# Patient Record
Sex: Male | Born: 1935 | Race: White | Hispanic: No | Smoking: Former smoker
Health system: Southern US, Community
[De-identification: ages and names within clinical notes are randomized; demographics above are authoritative.]

## PROBLEM LIST (undated history)

## (undated) DIAGNOSIS — E7800 Pure hypercholesterolemia, unspecified: Secondary | ICD-10-CM

## (undated) DIAGNOSIS — F0390 Unspecified dementia without behavioral disturbance: Secondary | ICD-10-CM

## (undated) DIAGNOSIS — I219 Acute myocardial infarction, unspecified: Secondary | ICD-10-CM

## (undated) DIAGNOSIS — K219 Gastro-esophageal reflux disease without esophagitis: Secondary | ICD-10-CM

## (undated) DIAGNOSIS — I2510 Atherosclerotic heart disease of native coronary artery without angina pectoris: Secondary | ICD-10-CM

## (undated) DIAGNOSIS — C801 Malignant (primary) neoplasm, unspecified: Secondary | ICD-10-CM

## (undated) DIAGNOSIS — I509 Heart failure, unspecified: Secondary | ICD-10-CM

## (undated) DIAGNOSIS — I10 Essential (primary) hypertension: Secondary | ICD-10-CM

## (undated) DIAGNOSIS — IMO0001 Reserved for inherently not codable concepts without codable children: Secondary | ICD-10-CM

## (undated) DIAGNOSIS — I4891 Unspecified atrial fibrillation: Secondary | ICD-10-CM

## (undated) DIAGNOSIS — G459 Transient cerebral ischemic attack, unspecified: Secondary | ICD-10-CM

## (undated) DIAGNOSIS — J449 Chronic obstructive pulmonary disease, unspecified: Secondary | ICD-10-CM

## (undated) DIAGNOSIS — C61 Malignant neoplasm of prostate: Secondary | ICD-10-CM

## (undated) DIAGNOSIS — K922 Gastrointestinal hemorrhage, unspecified: Secondary | ICD-10-CM

## (undated) DIAGNOSIS — I209 Angina pectoris, unspecified: Secondary | ICD-10-CM

## (undated) DIAGNOSIS — E785 Hyperlipidemia, unspecified: Secondary | ICD-10-CM

## (undated) HISTORY — PX: FRACTURE SURGERY: SHX138

## (undated) HISTORY — PX: OTHER SURGICAL HISTORY: SHX169

---

## 1998-01-07 ENCOUNTER — Other Ambulatory Visit: Admission: RE | Admit: 1998-01-07 | Discharge: 1998-01-07 | Payer: Self-pay | Admitting: Urology

## 1998-01-09 ENCOUNTER — Inpatient Hospital Stay (HOSPITAL_COMMUNITY): Admission: EM | Admit: 1998-01-09 | Discharge: 1998-01-11 | Payer: Self-pay | Admitting: Emergency Medicine

## 1998-02-17 ENCOUNTER — Inpatient Hospital Stay (HOSPITAL_COMMUNITY): Admission: RE | Admit: 1998-02-17 | Discharge: 1998-02-20 | Payer: Self-pay | Admitting: Urology

## 1999-03-17 ENCOUNTER — Encounter (HOSPITAL_COMMUNITY): Admission: RE | Admit: 1999-03-17 | Discharge: 1999-06-15 | Payer: Self-pay | Admitting: Interventional Cardiology

## 1999-07-06 ENCOUNTER — Encounter: Admission: RE | Admit: 1999-07-06 | Discharge: 1999-10-04 | Payer: Self-pay | Admitting: Radiation Oncology

## 1999-08-19 ENCOUNTER — Encounter: Payer: Self-pay | Admitting: Urology

## 1999-08-21 ENCOUNTER — Ambulatory Visit (HOSPITAL_COMMUNITY): Admission: RE | Admit: 1999-08-21 | Discharge: 1999-08-21 | Payer: Self-pay | Admitting: Urology

## 2000-10-20 ENCOUNTER — Ambulatory Visit (HOSPITAL_COMMUNITY): Admission: RE | Admit: 2000-10-20 | Discharge: 2000-10-21 | Payer: Self-pay | Admitting: Interventional Cardiology

## 2001-05-31 ENCOUNTER — Encounter (INDEPENDENT_AMBULATORY_CARE_PROVIDER_SITE_OTHER): Payer: Self-pay

## 2001-05-31 ENCOUNTER — Ambulatory Visit (HOSPITAL_COMMUNITY): Admission: RE | Admit: 2001-05-31 | Discharge: 2001-05-31 | Payer: Self-pay | Admitting: Gastroenterology

## 2008-04-02 ENCOUNTER — Ambulatory Visit (HOSPITAL_COMMUNITY): Admission: RE | Admit: 2008-04-02 | Discharge: 2008-04-02 | Payer: Self-pay | Admitting: Interventional Cardiology

## 2010-06-03 ENCOUNTER — Ambulatory Visit: Payer: Self-pay | Admitting: Family Medicine

## 2010-09-01 NOTE — Assessment & Plan Note (Signed)
Summary: FLU SHOT/EVM  Nurse Visit   Immunizations Administered:  Influenza Vaccine:    Vaccine Type: FLULAVAL    Mfr: GlaxoSmithKline    Dose: 0.25 ml    Route: IM    Given by: Levonne Spiller EMT-P    Exp. Date: 01/30/2011    Lot #: [id number]    VIS given: 02/24/10 version given June 03, 2010.   Immunizations Administered:  Influenza Vaccine:    Vaccine Type: FLULAVAL    Mfr: GlaxoSmithKline    Dose: 0.25 ml    Route: IM    Given by: Levonne Spiller EMT-P    Exp. Date: 01/30/2011    Lot #: [id number]    VIS given: 02/24/10 version given June 03, 2010.  Flu Vaccine Consent Questions:    Do you have a history of severe allergic reactions to this vaccine? no    Any prior history of allergic reactions to egg and/or gelatin? no    Do you have a sensitivity to the preservative Thimersol? no    Do you have a past history of Guillan-Barre Syndrome? no    Do you currently have an acute febrile illness? no    Have you ever had a severe reaction to latex? no    Vaccine information given and explained to patient? yes

## 2010-12-15 NOTE — Cardiovascular Report (Signed)
NAME:  Logan Day, BAGOT NO.:  [id number]   MEDICAL RECORD NO.:  [id number]          PATIENT TYPE:  OIB   LOCATION:  2899                         FACILITY:  [facility]   PHYSICIAN:  Lyn Records, M.D.   DATE OF BIRTH:  02-13-1933   DATE OF PROCEDURE:  04/02/2008  DATE OF DISCHARGE:                            CARDIAC CATHETERIZATION   INDICATIONS:  Recurring episodes of chest and back discomfort similar to  those that were present prior to stenting of the circumflex coronary  artery in 1999.  Study is being done to rule out progression of coronary  artery disease or late restenosis of the previously placed stent.   PROCEDURE PERFORMED:  1. Left heart catheterization.  2. Selective coronary angiography.  3. Left ventriculography.  4. Angio-Seal.   DESCRIPTION:  After informed consent, a 6-French sheath was placed in  the right femoral artery using a modified Seldinger technique following  1% Xylocaine local infiltration.  A 1 mg of Versed and 25 mcg of  fentanyl was given intravenously.   A 6-French A2 multipurpose catheter was then used for hemodynamic  recordings, left ventriculography by hand injection, and attempted  coronary angiography.  Because of the dimensions of the aortic root and  angulation that prevented adequate cannulation of the left and right  coronary ostia, we then used Judkins type preformed 6-French #4  catheters for the left and right coronary artery.  After review of the  coronary angios and digital display compared to prior studies in 2002,  the case was terminated.   Angio-Seal was performed after demonstration of appropriate anatomy.  Good hemostasis was achieved.   RESULTS:  1. Hemodynamic data:      a.     Aortic pressure 135/54.      b.     Left ventricular pressure 135/18 mmHg.  2. Left ventriculography:  There is inferior wall mild hypokinesis.      No actual significant regional abnormality is noted.  Overall      function is  normal with EF of 60%.  3. Coronary angiography.      a.     Left main coronary:  The left main coronary artery is short.       Left main coronary is widely patent.      b.     Left anterior descending coronary:  The LAD is transapical.       It contains luminal irregularities in the midvessel, 2 diagonal       branches arise from it.  No significant obstruction is seen.      c.     Circumflex artery:  The circumflex gives origin to 2       dominant obtuse marginal branches, the PDA, and 2 smaller distal       obtuse marginal branches.  Irregularities are noted in the mid       vessel with up to 50% narrowing.  The first obtuse marginal origin       contains 50-60% narrowing.  The previously placed stent in the       distal circumflex, which  overlaps the continuation of the       circumflex remains widely patent with up to 30% in-stent       restenosis.  No significant obstruction is noted throughout the       circumflex.  There is proximal eccentric 50% narrowing noted.      d.     Right coronary:  This vessel is nondominant.  No significant       obstruction is noted.  Irregularities are noted in the proximal       vessel.   CONCLUSION:  1. Widely patent coronary arteries including the previously placed      distal circumflex stent.  No obstructive lesions are felt to be      present.  2. Normal overall left ventricular function.  3. When compared to the prior study from 2002, no significant changes      occurred.  4. Recurring chest and back discomfort, does not appear to be related      to myocardial ischemia.  This is likely musculoskeletal.  At some      point, if he continues to have chest and back discomfort, a CT      angio of the aorta needs to be done to rule out an aneurysm.   PLAN:  Continue current medical therapy.  Monitor symptoms.  Consider CT  angio of the aorta to rule out aneurysm, if continued symptoms (during  the cardiac catheterization), there was no wire  movement or clinical  indicators of any significant region of aneurysm formation, although  this is a weak clinical correlate and actual demonstration of the aortic  lumen would be a reasonable consideration if the diagnosis of an  aneurysm in seriously considered.      Lyn Records, M.D.  Electronically Signed     HWS/MEDQ  D:  04/02/2008  T:  04/02/2008  Job:  [id number]   cc:   Theressa Millard, M.D.

## 2010-12-18 NOTE — Cardiovascular Report (Signed)
[place]  Patient:    Logan Day, Logan Day                     MRN: [id number] Proc. Date: 10/20/00 Adm. Date:  [id number] Attending:  Lyn Records. Iii CC:         James C. Earl Gala, M.D.   Cardiac Catheterization  INDICATIONS FOR PROCEDURE:  Abnormal stress Cardiolite demonstrating inferior ischemia.  The patient  has a history of coronary artery disease with circumflex coronary stent placed in June of 1999.  PROCEDURES PERFORMED: 1. Left heart catheterization. 2. Selective coronary angiography. 3. Left ventriculography. 4. Percutaneous transluminal angioplasty of re-stenosis in the distal    circumflex and also percutaneous transluminal coronary angioplasty of the    distal circumflex through the stent which jailed the ostium of the distal    circumflex.  DESCRIPTION OF PROCEDURE:  After informed consent, a 6 French sheath was inserted into the right femoral artery using a modified Seldinger technique. Diagnostic coronary angiography was performed with a 6 French A2 multipurpose catheter, a #4 6 French left Judkins catheter and a #4 right Judkins catheter, 6 [place].  After studying the cine, it was felt that there was moderate re-stenosis in the stent in the distal circumflex and the ostium of the second obtuse marginal artery.  Arising from within this stent is the ostium for the continuation of the circumflex which is 99% occluded.  The territory is relatively small.  This territory likely accounts for the abnormality of the scan.  It was felt that intervention was necessary so we upgraded to a 7 French 4.0 Voda left guide catheter and used two luge short wires.  We had difficulty entering the distal circumflex through the stent and into the jailed segment but were successful primarily.  We had no difficulty crossing through the luminal portion of the stent into the distal second obtuse marginal.  We were unable to pass a balloon into  the jailed distal circumflex through the stent.  We then performed angioplasty with a Cutting Balloon 3.0 10 mm length on the region of in-stent re-stenosis.  This led to sluggish flow in the circumflex beyond the jailed segment.  The patient began having chest discomfort.  We then used several balloons attempting to cross the stent struts, but were not successful until we decreased our diameter to a 1.5 mm diameter Maverick 15 mm long balloon.  Three inflations to 13 or 14 atmospheres were performed then were upgraded to a 9 mm long 2.0 Maverick.  We had some difficulty crossing but were able to do several inflations to 12 or 13 atmospheres which is the 22.5 diameter.  Brisk antegrade was then again noted in the distal circumflex through the stent struts and the patients chest discomfort resolved.  We did a final balloon inflation on the stented region to smooth out any raised stent in the region that was dilated to the stent struts.  The patient received double bolus and infusion of Integrilin and also received 4000 units of IV heparin.  ACT postprocedure was 209 seconds.  RESULTS:  I:  HEMODYNAMIC DATA:     a. The aortic pressure was 149/74 mmHg.     b. Left ventricular pressure 153/24 mmHg.  II:  LEFT VENTRICULOGRAPHY:  Overall left ventricular function is normal. Ejection fraction is 60%.  III:  SELECTIVE CORONARY ANGIOGRAPHY:     a. Left main:  The left main coronary artery is short and free  of any        significant obstruction.     b. Left anterior descending coronary artery:  The left anterior        descending coronary artery is large and gives origin to one        diagonal branch.  Minimal luminal irregularities are noted in the        proximal and mid vessel.  The LAD wraps around the left ventricular        apex.     c. Circumflex artery:  The circumflex artery is large giving one large        first obtuse marginal.  The circumflex in the region of the stent as         it enters into the second obtuse marginal contains 50-70% stenosis.        The continuation of the circumflex beyond this region arising in the        jailed position from the stent contains 99% stenosis.  This distal        territory is relatively small.  Luminal irregularities are noted in the        proximal circumflex and in the first obtuse marginal.  There also        appears to be a high ramus intermedius branch that arises from the        left main and is free of significant obstruction.     d. Right coronary artery:  Nondominant.  Free of significant obstruction.   IV:  PERCUTANEOUS CORONARY INTERVENTION:     a. Angioplasty for in-stent re-stenosis with 3.0 Cutting Balloon led to        0 stenosis.     b. Angioplasty through the stent struts into the continuation of the        circumflex decreased the lesion from 99% to 50% with brisk antegrade        flow and resolution of chest pain that started during Cutting Balloon        angioplasty within the stent.  CONCLUSIONS: 1. Moderate re-stenosis in the distal stent in the circumflex as it enters    into the second obtuse marginal. 2. Severe re-stenosis of the continuation of the jailed circumflex as it    arises from the midportion of the stent. 3. The right coronary and LAD are free of significant disease. 4. Overall normal LV function. 5. Successful PCI for in-stent re-stenosis with reduction from 70% to 0%.    Successful PCI through the stent on the jailed distal circumflex with    reduction of stenosis from 99% (with TIMI-2 flow) to 50% with TIMI grade    3 flow.  PLAN:  IV Integrilin, Plavix, aspirin, hopeful discharge October 21, 2000. DD:  10/20/00 TD:  10/20/00 Job: [id number]

## 2010-12-18 NOTE — Procedures (Signed)
[facility]  Patient:    Logan Day, Logan Day Visit Number: [id number] MRN: [id number]          Service Type: Attending:  Verlin Grills, M.D. Dictated by:   Verlin Grills, M.D. Proc. Date: 05/31/01   CC:         Theressa Millard, M.D.   Procedure Report  PROCEDURE:  Colonoscopy and polypectomy.  REFERRING PHYSICIAN:  Theressa Millard, M.D.  INDICATIONS FOR PROCEDURE:  The patient (date of birth, 12-Nov-1932) is a 77 year old male who has undergone colonoscopic exams in the past to remove colon polyps.  The patient is due for a surveillance colonoscopy with polypectomy to prevent colon cancer.  ENDOSCOPIST:  Verlin Grills, M.D.  PREMEDICATION:  Versed 7 mg and Demerol 50 mg.  ENDOSCOPE:  Olympus pediatric colonoscope.  DESCRIPTION OF PROCEDURE:  After obtaining informed consent, the patient was placed in the left lateral decubitus position.  I administered intravenous Demerol and intravenous Versed to achieve conscious sedation for the procedure.  The patients blood pressure, oxygen saturation, and cardiac rhythm were monitored throughout the procedure and documented in the medical record.  Anal inspection was normal.  Digital rectal exam revealed an absent prostate, or at least a prostate that I could not palpate.  The Olympus pediatric video colonoscope was introduced into the rectum and easily advanced to the cecum with the patient in the left lateral decubitus position.  The colonic preparation for the exam today was excellent.  Rectum normal.  Sigmoid colon and descending colon:  Extensive left colonic diverticulosis. At 40 cm from the anal verge, a 2 mm sessile polyp was removed with the electrocautery snare and submitted for pathologic interpretation.  Splenic flexure normal.  Transverse colon normal.  Hepatic flexure normal.  Ascending colon normal.  Cecum and ileocecal valve normal.  ASSESSMENT: 1.  Left colonic diverticulosis. 2. From the sigmoid colon, at 40 cm from the anal verge, a 2 mm sessile polyp    was removed with electrocautery snare and submitted for pathologic    interpretation.  RECOMMENDATIONS:  Repeat colonoscopy in five years. Dictated by:   Verlin Grills, M.D. Attending:  Verlin Grills, M.D. DD:  05/31/01 TD:  06/01/01 Job: [id number]

## 2010-12-18 NOTE — H&P (Signed)
[place]. [facility]  Patient:    Logan Day, Logan Day                     MRN: [id number] Adm. Date:  [id number] Attending:  Lyn Records. Iii Dictator:   Anselm Lis, N.P. CC:         Darci Needle, M.D.  [place]. Logan Day, M.D.   History and Physical  DATE OF BIRTH: August 23, 1932  DATE OF PROCEDURE: October 20, 2000.  PROBLEMS (as dictated by Dr. Verdis Day):  1. Coronary atherosclerotic heart disease, status post stenting of     circumflex three years earlier.  Cardiac risk factors include a history of     known coronary artery disease, dyslipidemia, and question of hypertension.     The patient also has a history of palpitations, for which he takes     Betapace.  He has been without anginal or congestive heart failure     symptoms; however, screening Cardiolite study revealed ischemia     inferolaterally and in the septal area with preserved left ventricular     function.  He is status post non-Q wave myocardial infarction in June 1999     (small), with subsequent stenting of the mid circumflex.  His left     ventricular function was normal at that time.  Recent stress Cardiolite     study on October 11, 2000 was not significantly changed from the study of     October 13, 1999.  Ejection fraction was preserved at 60%.  2. Dyslipidemia, on Lipitor prescribed by Dr. Earl Day; management by     Dr. Earl Day.  3. Radical retropubic prostatectomy for prostate cancer in July 1999,     performed by Dr. Vonita Day.  4. Question of history of hypertension.  5. History of supraventricular tachycardia, confirmed by Holter monitoring     in 1998.  He was started on Betapace approximately two months earlier with     resolution of palpitations.  The patient denies history of diabetes, thyroid disease, cancer, or asthma.  PLAN (as dictated by Dr. Verdis Day): The patient has been counseled to undergo and has accepted plans for coronary angiography, with  possible percutaneous intervention if indicated and able.  The risks, potential complications, benefit, and alternatives of the procedure were discussed in detail and [name] has indicated that his questions and concerns have been addressed and is agreeable to proceed.  ALLERGIES: No known drug allergies.  CURRENT MEDICATIONS:  1. Multivitamin q.d.  2. Enteric-coated baby aspirin q.d.  3. Prilosec 20 mg p.r.n.  4. Lipitor 20 mg p.o. q.d.  5. Betapace 80 mg p.o. b.i.d.  SOCIAL HISTORY: The patient quit use of tobacco in 1998, having prior smoked 1-1/2 packs of cigarettes per day for 42 years.  Negative ETOH use.  The patient has been married for 43 years and has two sons, alive and well.  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  FAMILY HISTORY: His father died at the age of 56 of bleeding ulcers.  His mother died at the age of 9.  He has one sister, alive and well.  The patient has two brothers - one died of pulmonary fibrosis at the age of 32 and the other brother died of lung infection at the age of 23.  The patients older son had a stent placed in one of his coronary arteries at the age of 27.  REVIEW OF  SYSTEMS: As stated in the HPI/Past Medical History; otherwise, denies problems with dizziness, light headedness, syncope or near syncopal episodes.  He denies dysphagia for food or fluids.  He wears eyeglasses.  No visual or hearing deficits.  He denies dysuria or hematuria.  Negative for melena or bright red blood per rectum.  He does have problems with diarrhea, thought secondary to Betapace therapy.  No specific arthritic-type complaints, just generalized aches and pains.  He denies orthopnea, PND, or pedal edema.  PHYSICAL EXAMINATION (as performed by Dr. Verdis Day):  VITAL SIGNS: Blood pressure 126/86, heart rate 54 and regular, respiratory rate 18, temperature 97.8 degrees.  Height 6 feet.  Weight 205 pounds.  GENERAL: He is a slender, older gentleman in no  apparent distress.  His wife is in attendance.  HEENT: Brisk bilateral carotid upstrokes noted, without bruits.  NECK: Without JVD, no thyromegaly.  CHEST: Scant bibasilar crackles noted, otherwise clear.  CARDIAC: Regular rate and rhythm, without murmurs, rubs, or gallops.  Normal S1 and S2.  ABDOMEN: Soft, nondistended, normoactive bowel sounds.  Negative for abdominal aorta, renal, of femoral bruits.  Nontender to applied pressure.  EXTREMITIES: Bilateral +2/4 radial, femoral, dorsalis pedis, and posterior tibial pulses.  Negative pedal edema.  NEUROLOGIC: Cranial nerves 2-12 grossly intact.  Alert and oriented x 3.  GU/RECTAL: Examinations are deferred.  LABORATORY DATA: Chest x-ray on October 17, 2000 revealed atelectasis or infiltrate at the left lower base.  Sodium 140, potassium 5, chloride 102, CO2 30, BUN 16, creatinine 1.1, glucose 105.  LFTs without normal range.  Hemoglobin 14.8, WBC 5.7; platelets 271,000; hematocrit 42.3.  PT 11.2, INR 0.90, PTT 30.  EKG done on August 31, 2000 revealed sinus bradycardia at 51 beats per minute, without ischemic changes; QTC okay. DD:  10/20/00 TD:  10/20/00 Job: [id number]

## 2011-07-15 ENCOUNTER — Encounter: Payer: Self-pay | Admitting: Emergency Medicine

## 2011-07-15 ENCOUNTER — Other Ambulatory Visit: Payer: Self-pay

## 2011-07-15 ENCOUNTER — Emergency Department (HOSPITAL_COMMUNITY): Payer: Medicare Other

## 2011-07-15 ENCOUNTER — Inpatient Hospital Stay (HOSPITAL_COMMUNITY)
Admission: EM | Admit: 2011-07-15 | Discharge: 2011-07-23 | DRG: 065 | Disposition: A | Discharge status: Skilled Nursing Facility | Payer: Medicare Other | Attending: Internal Medicine | Admitting: Internal Medicine

## 2011-07-15 DIAGNOSIS — I639 Cerebral infarction, unspecified: Secondary | ICD-10-CM | POA: Diagnosis present

## 2011-07-15 DIAGNOSIS — R451 Restlessness and agitation: Secondary | ICD-10-CM

## 2011-07-15 DIAGNOSIS — Z23 Encounter for immunization: Secondary | ICD-10-CM

## 2011-07-15 DIAGNOSIS — I69320 Aphasia following cerebral infarction: Secondary | ICD-10-CM

## 2011-07-15 DIAGNOSIS — I10 Essential (primary) hypertension: Secondary | ICD-10-CM | POA: Diagnosis present

## 2011-07-15 DIAGNOSIS — Z8673 Personal history of transient ischemic attack (TIA), and cerebral infarction without residual deficits: Secondary | ICD-10-CM

## 2011-07-15 DIAGNOSIS — K219 Gastro-esophageal reflux disease without esophagitis: Secondary | ICD-10-CM | POA: Diagnosis present

## 2011-07-15 DIAGNOSIS — I6340 Cerebral infarction due to embolism of unspecified cerebral artery: Principal | ICD-10-CM | POA: Diagnosis present

## 2011-07-15 DIAGNOSIS — I252 Old myocardial infarction: Secondary | ICD-10-CM

## 2011-07-15 DIAGNOSIS — N390 Urinary tract infection, site not specified: Secondary | ICD-10-CM | POA: Diagnosis not present

## 2011-07-15 DIAGNOSIS — I4891 Unspecified atrial fibrillation: Secondary | ICD-10-CM | POA: Diagnosis present

## 2011-07-15 DIAGNOSIS — R319 Hematuria, unspecified: Secondary | ICD-10-CM | POA: Diagnosis not present

## 2011-07-15 DIAGNOSIS — I2510 Atherosclerotic heart disease of native coronary artery without angina pectoris: Secondary | ICD-10-CM | POA: Diagnosis present

## 2011-07-15 DIAGNOSIS — R4701 Aphasia: Secondary | ICD-10-CM | POA: Diagnosis present

## 2011-07-15 DIAGNOSIS — Z7901 Long term (current) use of anticoagulants: Secondary | ICD-10-CM

## 2011-07-15 DIAGNOSIS — Z8546 Personal history of malignant neoplasm of prostate: Secondary | ICD-10-CM

## 2011-07-15 DIAGNOSIS — E876 Hypokalemia: Secondary | ICD-10-CM | POA: Diagnosis present

## 2011-07-15 DIAGNOSIS — E785 Hyperlipidemia, unspecified: Secondary | ICD-10-CM | POA: Diagnosis present

## 2011-07-15 HISTORY — DX: Acute myocardial infarction, unspecified: I21.9

## 2011-07-15 HISTORY — DX: Transient cerebral ischemic attack, unspecified: G45.9

## 2011-07-15 HISTORY — DX: Malignant (primary) neoplasm, unspecified: C80.1

## 2011-07-15 HISTORY — DX: Pure hypercholesterolemia, unspecified: E78.00

## 2011-07-15 HISTORY — DX: Hyperlipidemia, unspecified: E78.5

## 2011-07-15 HISTORY — DX: Angina pectoris, unspecified: I20.9

## 2011-07-15 HISTORY — DX: Unspecified atrial fibrillation: I48.91

## 2011-07-15 HISTORY — DX: Malignant neoplasm of prostate: C61

## 2011-07-15 HISTORY — DX: Essential (primary) hypertension: I10

## 2011-07-15 HISTORY — DX: Atherosclerotic heart disease of native coronary artery without angina pectoris: I25.10

## 2011-07-15 LAB — LIPID PANEL
Cholesterol: 173 mg/dL (ref 0–200)
VLDL: 30 mg/dL (ref 0–40)

## 2011-07-15 LAB — DIFFERENTIAL
Basophils Absolute: 0 10*3/uL (ref 0.0–0.1)
Basophils Relative: 1 % (ref 0–1)
Eosinophils Absolute: 0.4 10*3/uL (ref 0.0–0.7)
Lymphs Abs: 1.4 10*3/uL (ref 0.7–4.0)
Neutrophils Relative %: 70 % (ref 43–77)

## 2011-07-15 LAB — CBC
HCT: 40.6 % (ref 39.0–52.0)
Hemoglobin: 13.9 g/dL (ref 13.0–17.0)
MCH: 31.1 pg (ref 26.0–34.0)
MCH: 30.6 pg (ref 26.0–34.0)
MCHC: 34.7 g/dL (ref 30.0–36.0)
MCHC: 34.2 g/dL (ref 30.0–36.0)
Platelets: 169 10*3/uL (ref 150–400)
RBC: 4.53 MIL/uL (ref 4.22–5.81)
RDW: 13.9 % (ref 11.5–15.5)
RDW: 13.8 % (ref 11.5–15.5)

## 2011-07-15 LAB — COMPREHENSIVE METABOLIC PANEL
ALT: 15 U/L (ref 0–53)
Albumin: 3.4 g/dL — ABNORMAL LOW (ref 3.5–5.2)
Alkaline Phosphatase: 65 U/L (ref 39–117)
Potassium: 2.7 mEq/L — CL (ref 3.5–5.1)
Sodium: 139 mEq/L (ref 135–145)
Total Protein: 7.5 g/dL (ref 6.0–8.3)

## 2011-07-15 LAB — URINALYSIS, ROUTINE W REFLEX MICROSCOPIC
Ketones, ur: NEGATIVE mg/dL
Leukocytes, UA: NEGATIVE
Nitrite: NEGATIVE
Specific Gravity, Urine: 1.016 (ref 1.005–1.030)
Urobilinogen, UA: 1 mg/dL (ref 0.0–1.0)
pH: 7.5 (ref 5.0–8.0)

## 2011-07-15 LAB — GLUCOSE, CAPILLARY: Glucose-Capillary: 106 mg/dL — ABNORMAL HIGH (ref 70–99)

## 2011-07-15 LAB — PROTIME-INR
INR: 1.08 (ref 0.00–1.49)
Prothrombin Time: 14.2 seconds (ref 11.6–15.2)

## 2011-07-15 LAB — CK TOTAL AND CKMB (NOT AT ARMC): Relative Index: INVALID (ref 0.0–2.5)

## 2011-07-15 LAB — HEMOGLOBIN A1C: Mean Plasma Glucose: 120 mg/dL — ABNORMAL HIGH (ref <117)

## 2011-07-15 LAB — TROPONIN I: Troponin I: <0.3 ng/mL (ref <0.30)

## 2011-07-15 LAB — CARDIAC PANEL(CRET KIN+CKTOT+MB+TROPI): Troponin I: <0.3 ng/mL (ref <0.30)

## 2011-07-15 MED ORDER — OXYCODONE-ACETAMINOPHEN 5-325 MG PO TABS
ORAL_TABLET | ORAL | Status: AC
  Filled 2011-07-15: qty 1

## 2011-07-15 MED ORDER — ENOXAPARIN SODIUM 40 MG/0.4ML ~~LOC~~ SOLN
40.0000 mg | SUBCUTANEOUS | Status: DC
  Administered 2011-07-15 – 2011-07-16 (×2): 40 mg via SUBCUTANEOUS
  Filled 2011-07-15 (×3): qty 0.4

## 2011-07-15 MED ORDER — DOXYCYCLINE HYCLATE 100 MG PO TABS
ORAL_TABLET | ORAL | Status: AC
  Filled 2011-07-15: qty 1

## 2011-07-15 MED ORDER — PANTOPRAZOLE SODIUM 40 MG PO TBEC
40.0000 mg | DELAYED_RELEASE_TABLET | Freq: Every day | ORAL | Status: DC
  Administered 2011-07-15 – 2011-07-23 (×7): 40 mg via ORAL
  Filled 2011-07-15 (×5): qty 1

## 2011-07-15 MED ORDER — SOTALOL HCL 120 MG PO TABS
60.0000 mg | ORAL_TABLET | Freq: Two times a day (BID) | ORAL | Status: DC
  Administered 2011-07-17: 60 mg via ORAL
  Filled 2011-07-15 (×7): qty 0.5

## 2011-07-15 MED ORDER — ACETAMINOPHEN 325 MG PO TABS
650.0000 mg | ORAL_TABLET | ORAL | Status: DC | PRN
  Administered 2011-07-20 – 2011-07-21 (×2): 650 mg via ORAL
  Filled 2011-07-15 (×2): qty 2

## 2011-07-15 MED ORDER — ACETAMINOPHEN 650 MG RE SUPP: 650.0000 mg | RECTAL | Status: DC | PRN

## 2011-07-15 MED ORDER — SENNOSIDES-DOCUSATE SODIUM 8.6-50 MG PO TABS: 1.0000 | ORAL_TABLET | Freq: Every evening | ORAL | Status: DC | PRN

## 2011-07-15 MED ORDER — ONDANSETRON HCL 4 MG/2ML IJ SOLN: 4.0000 mg | Freq: Four times a day (QID) | INTRAMUSCULAR | Status: DC | PRN

## 2011-07-15 MED ORDER — SODIUM CHLORIDE 0.9 % IJ SOLN: 3.0000 mL | INTRAMUSCULAR | Status: DC | PRN

## 2011-07-15 MED ORDER — SIMVASTATIN 20 MG PO TABS
20.0000 mg | ORAL_TABLET | Freq: Every day | ORAL | Status: DC
  Administered 2011-07-16 – 2011-07-22 (×7): 20 mg via ORAL
  Filled 2011-07-15 (×8): qty 1

## 2011-07-15 MED ORDER — SERTRALINE HCL 100 MG PO TABS
100.0000 mg | ORAL_TABLET | Freq: Every day | ORAL | Status: DC
  Administered 2011-07-15 – 2011-07-23 (×9): 100 mg via ORAL
  Filled 2011-07-15 (×9): qty 1

## 2011-07-15 MED ORDER — POTASSIUM CHLORIDE CRYS ER 20 MEQ PO TBCR
40.0000 meq | EXTENDED_RELEASE_TABLET | Freq: Once | ORAL | Status: AC
  Administered 2011-07-15: 40 meq via ORAL
  Filled 2011-07-15: qty 2

## 2011-07-15 MED ORDER — ASPIRIN 300 MG RE SUPP
300.0000 mg | Freq: Every day | RECTAL | Status: DC
  Filled 2011-07-15 (×7): qty 1

## 2011-07-15 MED ORDER — POTASSIUM CHLORIDE 10 MEQ/100ML IV SOLN
10.0000 meq | INTRAVENOUS | Status: AC
  Administered 2011-07-15 (×2): 10 meq via INTRAVENOUS
  Filled 2011-07-15 (×2): qty 100

## 2011-07-15 MED ORDER — ASPIRIN 325 MG PO TABS
325.0000 mg | ORAL_TABLET | Freq: Every day | ORAL | Status: DC
  Administered 2011-07-15 – 2011-07-21 (×7): 325 mg via ORAL
  Filled 2011-07-15 (×8): qty 1

## 2011-07-15 NOTE — ED Notes (Signed)
Patients diastolic blood pressure noted and reported to rn/chris c.

## 2011-07-15 NOTE — ED Provider Notes (Signed)
Medical screening examination/treatment/procedure(s) were performed by non-physician practitioner and as supervising physician I was immediately available for consultation/collaboration.  Pleshette Tomasini R. Aireal Slater, MD 07/15/11 1551 

## 2011-07-15 NOTE — H&P (Signed)
PCP:   Darnelle Bos, MD, MD   Chief Complaint:  Abnormal speech  HPI: Patient is a 77 year old male with history of hypertension, coronary artery disease status post PCI, previous TIA, hyperlipidemia, prostate cancer status post prostatectomy who was brought to the emergency room by EMS due to 2 abnormal speech. Patient is unable to provide any history secondary to his aphasia. History is obtained from patient's son Jayveion Stalling who is at the bedside. Not sure about the history of atrial fibrillation.  Patient was in his usual state of health until approximately 9 PM last night. Patient spoke with the historians brother and seemed his usual. At approximately 8:45 this morning [name] got a call from the patient and he sounded slurred and abnormal. When [name] went to see him, patient was found sitting on her chair with the same clothes that he had the day before. It is suspected that he never slept on his bed. He had abnormal speech which was difficult to understand. He also was not following any commands. There is no history of headache or chest pain or palpitations. No facial asymmetry or limb asymmetry was appreciated. EMS was called. He was generally weak and had to be helped to the ambulance. He was brought to the emergency room. CT of the head confirmed an ischemic stroke which was acute or subacute in the left temporoparietal area. He was out of the window for TPA. Since being in the emergency room his speech apparently has improved some.  Past Medical History: Past Medical History  Diagnosis Date  . Hypertension   . Atrial fibrillation   . Hypercholesteremia   . MI (myocardial infarction)     x 2  . Angina   . Cancer     prostate cancer  . Coronary artery disease   . Prostate cancer   . Hyperlipidemia   . MI (myocardial infarction)   . TIA (transient ischemic attack)     Past Surgical History: Past Surgical History  Procedure Date  . Prostayectomy   . Fracture surgery      Allergies:  No Known Allergies  Medications: Prior to Admission medications   Medication Sig Start Date End Date Taking? Authorizing Provider  hydrochlorothiazide (HYDRODIURIL) 25 MG tablet Take 25 mg by mouth every morning.     Yes Historical Provider, MD  omeprazole (PRILOSEC) 20 MG capsule Take 20 mg by mouth daily.     Yes Historical Provider, MD  pravastatin (PRAVACHOL) 40 MG tablet Take 40 mg by mouth daily.     Yes Historical Provider, MD  sertraline (ZOLOFT) 100 MG tablet Take 100 mg by mouth daily.     Yes Historical Provider, MD  sotalol (BETAPACE) 120 MG tablet Take 60 mg by mouth 2 (two) times daily.     Yes Historical Provider, MD    Family History: Family History  Problem Relation Age of Onset  . Coronary artery disease Son     Social History:  reports that he has quit smoking. His smokeless tobacco use includes Chew. He reports that he does not drink alcohol or use illicit drugs.  Review of Systems:  All systems reviewed and apart from history of presenting illness is negative.  Physical Exam: Filed Vitals:   07/15/11 1234 07/15/11 1418 07/15/11 1602 07/15/11 1742  BP:  152/73 113/40 128/62  Pulse:  67 56 56  Temp: 97.9 F (36.6 C)     TempSrc:      Resp:  12 15 23  SpO2:  100% 100% 99%   General exam: Patient is a moderately built and nourished male patient was lying comfortably supine on the gurney and is in no obvious distress. Head, eyes and ENT: Nontraumatic normocephalic. Pupils equally reacting to light and accommodation. Bilateral immature cataracts. Neck: Supple. No JVD or carotid bruit. Lymphatics: No lymphadenopathy. Respiratory system: Clear. No increased work of breathing. Cardiovascular system: First and second heart sounds heard, regular. No JVD or murmur. Gastrointestinal system: Abdomen is nondistended, soft and normal bowel sounds heard. No organomegaly or masses appreciated. Central nervous system: Patient is alert and aphasic. He  obeys 10% of commands. He says random words such as "okay". No facial asymmetry detected. Difficult to perform all cranial nerve assessment secondary to lack of patient cooperation. Extremities: Seem to be symmetrically normal power. Symmetrical 1+ reflexes. Right plantar seems to be extensor.  Labs on Admission:   [facility] 07/15/11 1111  NA 139  K 2.7*  CL 101  CO2 28  GLUCOSE 106*  BUN 20  CREATININE 0.90  CALCIUM 9.1  MG --  PHOS --    Basename 07/15/11 1111  AST 21  ALT 15  ALKPHOS 65  BILITOT 0.7  PROT 7.5  ALBUMIN 3.4*   No results found for this basename: LIPASE:2,AMYLASE:2 in the last 72 hours  Basename 07/15/11 1111  WBC 7.6  NEUTROABS 5.3  HGB 14.1  HCT 40.6  MCV 89.6  PLT 169    Basename 07/15/11 1111  CKTOTAL 49  CKMB 2.3  CKMBINDEX --  TROPONINI <0.30   No results found for this basename: TSH,T4TOTAL,FREET3,T3FREE,THYROIDAB in the last 72 hours No results found for this basename: VITAMINB12:2,FOLATE:2,FERRITIN:2,TIBC:2,IRON:2,RETICCTPCT:2 in the last 72 hours  Radiological Exams on Admission: Ct Head Wo Contrast  07/15/2011  *RADIOLOGY REPORT*  Clinical Data: Aphasia, history of prostate cancer  CT HEAD WITHOUT CONTRAST  Technique:  Contiguous axial images were obtained from the base of the skull through the vertex without contrast.  Comparison: None  Findings: Generalized atrophy. Normal ventricular morphology. No midline shift or mass effect. Minimal small vessel chronic ischemic changes of deep cerebral white matter. Large old posterior right temporoparietal infarct. Large area of abnormal decreased attenuation and loss of gray-white differentiation at the left temporoparietal lobe consistent with acute to subacute infarct. No intracranial hemorrhage or definite mass lesion identified. No definite mass effect is seen at the area of the parenchymal attenuation abnormality at the left temporoparietal lobe.  Left basal ganglia lacunar infarct. No  additional areas of infarction identified. Posterior fossa normal appearance. Atherosclerotic calcifications of internal carotid arteries at skull base. Bones demineralized. No significant sinus opacification.  IMPRESSION: Atrophy with minimal small vessel chronic ischemic changes of deep cerebral white matter. Old right temporoparietal infarct. Acute to subacute left temporoparietal infarct. Lacunar infarct left basal ganglia.  Original Report Authenticated By: Lollie Marrow, M.D.    EKG showed sinus bradycardia at 53 beats per minute with PR interval of 224 ms suggestive of first degree AV block, possible left axis deviation the no acute ischemic changes noted.  Assessment/Plan 1. Acute to subacute left temporoparietal infarct with associated aphasia: Patient will be admitted to telemetry. Initiate complete stroke workup including MRI and MRA of the head without contrast, 2-D echocardiogram and carotid Dopplers. We will get physical therapy, occupational therapy and speech therapy consultation. Initiate full dose aspirin. Per ED nursing, patient has passed the bedside swallow screen. Will defer to surrounding M.D. in the morning regarding obtaining a neurology consultation. 2. Hypokalemia:  Likely secondary to thiazide diuretics. Patient has received oral and IV potassium. We'll repeat BMP in the morning. 3. Hypertension: Will hold thiazide diuretics and continue his Betapace. 4. Hyper lipidemia: Continue statins 5. History of coronary artery disease, status post PCI: Patient asymptomatic, EKG with no acute changes and first set of cardiac enzymes negative. 6. Full code HONGALGI,ANAND 07/15/2011, 6:42 PM

## 2011-07-15 NOTE — ED Notes (Signed)
Assumed care of pt.  No distress noted.  Pt sitting up in bed.  Answers all questions with 'i dont know what you're doing' or 'i dont' know'.  Is unable to follow commands,  Pt is alert, talking without slur at this time.  Family remains at bedside.

## 2011-07-15 NOTE — ED Notes (Signed)
2nd run of pt iv hung bu 3pm rn that has gone.  It was not clikced off until i clicked it off.

## 2011-07-15 NOTE — ED Provider Notes (Signed)
History     CSN: [id number] Arrival date & time: 07/15/2011 10:24 AM   First MD Initiated Contact with Patient 07/15/11 1043      Chief Complaint  Patient presents with  . Aphasia    intermittent inappropriate speech     (Consider location/radiation/quality/duration/timing/severity/associated sxs/prior treatment) HPI Comments: Patient with receptive aphasia first noticed this morning. The last was seen normal per the patient's son was yesterday at about 5 PM. The patient does not have a history of a stroke however had a potential TIA several years ago. He has a history of coronary artery disease with stent placement. Level 5 caveat due to altered mental status, inability to communicate.  Patient is a 77 y.o. male presenting with altered mental status. The history is provided by a relative.  Altered Mental Status This is a new problem. The current episode started today. Pertinent negatives include no weakness. The symptoms are aggravated by nothing. He has tried nothing for the symptoms.    Past Medical History  Diagnosis Date  . Hypertension   . Atrial fibrillation   . Hypercholesteremia   . MI (myocardial infarction)     x 2    No past surgical history on file.  No family history on file.  History  Substance Use Topics  . Smoking status: Never Smoker   . Smokeless tobacco: Not on file  . Alcohol Use: No      Review of Systems  Unable to perform ROS Neurological: Positive for speech difficulty. Negative for weakness.  Psychiatric/Behavioral: Positive for altered mental status.    Allergies  Review of patient's allergies indicates no known allergies.  Home Medications  No current outpatient prescriptions on file.  BP 166/71  Pulse 52  Temp(Src) 98.3 F (36.8 C) (Oral)  Resp 14  SpO2 95%  Physical Exam  Nursing note and vitals reviewed. Constitutional: He appears well-developed and well-nourished.  HENT:  Head: Normocephalic and atraumatic.  Eyes:  Conjunctivae are normal. Pupils are equal, round, and reactive to light. Right eye exhibits no discharge. Left eye exhibits no discharge.  Neck: Normal range of motion. Neck supple.       No carotid bruits detected  Cardiovascular: Normal rate, regular rhythm, normal heart sounds and intact distal pulses.   Pulmonary/Chest: Effort normal and breath sounds normal.  Abdominal: Soft. Bowel sounds are normal. There is no tenderness. There is no rebound and no guarding.  Musculoskeletal: He exhibits no edema.  Neurological: He is alert.       Patient not able to cooperate with neurological exam. He does have good strength in extremities. He is exhibiting receptive aphasia and is unable to follow commands. He does not have any facial droop or slurred speech.  Skin: Skin is warm and dry.  Psychiatric: He has a normal mood and affect.    ED Course  Procedures (including critical care time)  Labs Reviewed  COMPREHENSIVE METABOLIC PANEL - Abnormal; Notable for the following:    Potassium 2.7 (*)    Glucose, Bld 106 (*)    Albumin 3.4 (*)    GFR calc non Af Amer 81 (*)    All other components within normal limits  GLUCOSE, CAPILLARY - Abnormal; Notable for the following:    Glucose-Capillary 106 (*)    All other components within normal limits  PROTIME-INR  APTT  CBC  DIFFERENTIAL  CK TOTAL AND CKMB  TROPONIN I  URINALYSIS, ROUTINE W REFLEX MICROSCOPIC  LIPID PANEL  POCT CBG MONITORING  HEMOGLOBIN A1C   Ct Head Wo Contrast  07/15/2011  *RADIOLOGY REPORT*  Clinical Data: Aphasia, history of prostate cancer  CT HEAD WITHOUT CONTRAST  Technique:  Contiguous axial images were obtained from the base of the skull through the vertex without contrast.  Comparison: None  Findings: Generalized atrophy. Normal ventricular morphology. No midline shift or mass effect. Minimal small vessel chronic ischemic changes of deep cerebral white matter. Large old posterior right temporoparietal infarct. Large  area of abnormal decreased attenuation and loss of gray-white differentiation at the left temporoparietal lobe consistent with acute to subacute infarct. No intracranial hemorrhage or definite mass lesion identified. No definite mass effect is seen at the area of the parenchymal attenuation abnormality at the left temporoparietal lobe.  Left basal ganglia lacunar infarct. No additional areas of infarction identified. Posterior fossa normal appearance. Atherosclerotic calcifications of internal carotid arteries at skull base. Bones demineralized. No significant sinus opacification.  IMPRESSION: Atrophy with minimal small vessel chronic ischemic changes of deep cerebral white matter. Old right temporoparietal infarct. Acute to subacute left temporoparietal infarct. Lacunar infarct left basal ganglia.  Original Report Authenticated By: Lollie Marrow, M.D.     1. CVA (cerebral infarction)     11:02 AM patient seen and examined. He is having receptive aphasia. Suspect stroke. Workup initiated. Discussed with Dr. Rubin Payor. Code stroke not called due to last seen normal 18 hours ago.   Date: 07/15/2011  Rate: 53  Rhythm: sinus bradycardia, PVC  QRS Axis: normal  Intervals: PR prolonged  ST/T Wave abnormalities: normal  Conduction Disutrbances:none  Narrative Interpretation:   Old EKG Reviewed: unchanged 10/20/2000  1:20 PM CT confirms stroke. Family informed. Patient will be admitted to triad. Potassium repleted.    MDM  Admit for stroke and receptive aphasia.       Eustace Moore [place], [place] 07/15/11 1320

## 2011-07-15 NOTE — ED Notes (Signed)
The pts condition has not changed.  Family remains at the bedside.

## 2011-07-15 NOTE — ED Notes (Signed)
Per [facility], pt last known to be normal yesterday at 1730.  Pt's son noticed pt wasn't answering questions appropriately today while talking on the telephone this morning.  Family reports suspected TIA (never confirmed) several years ago.

## 2011-07-15 NOTE — ED Notes (Signed)
The pts family is waiting for the admitting doctor to see since the pt is uinable to explain himself

## 2011-07-15 NOTE — ED Notes (Signed)
pts family remains at bedside.  Pt given a coke to drink.  Pt very tearful, frustrated at his inability to speak.  No distress noted.  Pt resting.

## 2011-07-15 NOTE — ED Notes (Signed)
Admitting doctor here to see 

## 2011-07-15 NOTE — ED Notes (Signed)
The pt is alert no complaints of pain.  Moves all extremities.  Family at the bedside.  2nd pot  Run 

## 2011-07-16 ENCOUNTER — Inpatient Hospital Stay (HOSPITAL_COMMUNITY): Payer: Medicare Other

## 2011-07-16 LAB — CARDIAC PANEL(CRET KIN+CKTOT+MB+TROPI)
CK, MB: 2.9 ng/mL (ref 0.3–4.0)
CK, MB: 3.6 ng/mL (ref 0.3–4.0)
Total CK: 88 U/L (ref 7–232)
Troponin I: <0.3 ng/mL (ref <0.30)

## 2011-07-16 LAB — BASIC METABOLIC PANEL
BUN: 19 mg/dL (ref 6–23)
CO2: 23 mEq/L (ref 19–32)
Chloride: 106 mEq/L (ref 96–112)
Creatinine, Ser: 0.99 mg/dL (ref 0.50–1.35)
Glucose, Bld: 121 mg/dL — ABNORMAL HIGH (ref 70–99)
Potassium: 3 mEq/L — ABNORMAL LOW (ref 3.5–5.1)

## 2011-07-16 LAB — HEMOGLOBIN A1C
Hgb A1c MFr Bld: 6 % — ABNORMAL HIGH (ref <5.7)
Mean Plasma Glucose: 126 mg/dL — ABNORMAL HIGH (ref <117)

## 2011-07-16 LAB — LIPID PANEL
HDL: 37 mg/dL — ABNORMAL LOW (ref >39)
LDL Cholesterol: 89 mg/dL (ref 0–99)
Total CHOL/HDL Ratio: 4.1 RATIO
Triglycerides: 137 mg/dL (ref <150)
VLDL: 27 mg/dL (ref 0–40)

## 2011-07-16 MED ORDER — LORAZEPAM 2 MG/ML IJ SOLN
INTRAMUSCULAR | Status: AC
  Filled 2011-07-16: qty 1

## 2011-07-16 MED ORDER — POTASSIUM CHLORIDE CRYS ER 20 MEQ PO TBCR
40.0000 meq | EXTENDED_RELEASE_TABLET | Freq: Every day | ORAL | Status: AC
  Administered 2011-07-16 – 2011-07-17 (×2): 40 meq via ORAL
  Filled 2011-07-16 (×2): qty 2

## 2011-07-16 MED ORDER — PNEUMOCOCCAL VAC POLYVALENT 25 MCG/0.5ML IJ INJ
0.5000 mL | INJECTION | INTRAMUSCULAR | Status: AC
  Filled 2011-07-16: qty 0.5

## 2011-07-16 MED ORDER — LORAZEPAM 2 MG/ML IJ SOLN: 0.5000 mg | Freq: Once | INTRAMUSCULAR | Status: DC

## 2011-07-16 NOTE — Progress Notes (Signed)
Speech Language/Pathology Speech Language Pathology Evaluation Patient Details Name: Logan Day MRN: [id number] DOB: 1933-06-17 Today's Date: 07/16/2011  Problem List:  Patient Active Problem List  Diagnoses  . CVA (cerebral infarction)  . Aphasia S/P CVA  . Hypokalemia    Past Medical History:  Past Medical History  Diagnosis Date  . Hypertension   . Atrial fibrillation   . Hypercholesteremia   . MI (myocardial infarction)     x 2  . Angina   . Cancer     prostate cancer  . Coronary artery disease   . Prostate cancer   . Hyperlipidemia   . MI (myocardial infarction)   . TIA (transient ischemic attack)    Past Surgical History:  Past Surgical History  Procedure Date  . Prostayectomy   . Fracture surgery     SLP Assessment/Plan/Recommendation Assessment Clinical Impression Statement: Pt presents with severe global aphasia characterized by expressive and receptive deficits. Pt displays impairments with auditory comprehension characterized by inability to follow commands or respond to Y/N biological questions. Pt with automatic speech, 25% appropriate, often stating "I am sorry, I just don't know." Suspecting ideational apraxia secondary to son's report of pt eating orange slice with rind. Pt appears to have borderline awareness of deficits showing frustration when attempting to communicate. Pt will benefit from acute speech therapy and recommend outpatient speech services upon d/c.  SLP Recommendation/Assessment: Patient will need skilled Speech Lanaguage Pathology Services in the acute care venue to address identified deficits Problem List: Auditory comprehension;Reading comprehension;Verbal expression;Attention Therapy Diagnosis: Aphasia Type of Aphasia: Global Plan Speech Therapy Frequency: min 3x week Duration: 2 weeks Treatment/Interventions: Language facilitation;Environmental controls;Cueing hierarchy;Cognitive reorganization;Internal/external  aids;Functional tasks;Multimodal communcation approach;SLP instruction and feedback;Compensatory strategies;Patient/family education Potential to Achieve Goals: Fair Recommendation Follow up Recommendations: Outpatient SLP Individuals Consulted Consulted and Agree with Results and Recommendations: Family member/caregiver Family Member Consulted : Son- [place]  SLP Goals   Goal #1:Pt will verbalize basic needs by responding to yes/no questions with moderate assistance. Goal #2:Pt will follow one step commands with moderate visual cues.   Chyrel Masson, Speech Pathology Student 07/16/2011

## 2011-07-16 NOTE — Progress Notes (Signed)
MRI called to let me know pt wasn't able to lay still for procedure and wanted to know if pt had anything ordered to help him relax to complete procedure.  Call placed to Dr. Earl Gala, new order given for Ativan 0.5mg  IV X One Dose.  Charge nurse went to to give pt medication and to monitor pt for rest of procedure.  Charge nurse unable to give medication, Saline Lock Infiltrated and unable to start new IV.  MRI was partially completed. Call placed to Dr. Earl Gala to let him know.  Awaiting call back. A. Meziah Blasingame, RN

## 2011-07-16 NOTE — Progress Notes (Signed)
PRELIMINARY  PRELIMINARY  PRELIMINARY  PRELIMINARY  Carotid Duplex completed.    Preliminary report:  Bilateral:  No evidence of hemodynamically significant internal carotid artery stenosis.  Vertebral artery flow is antegrade.    Logan Day 07/16/2011 3:19 PM

## 2011-07-16 NOTE — Progress Notes (Signed)
PT Cancellation Note Patient remains on bedrest per MD orders.  MD:  Please write activity orders when appropriate for patient.  Thank you. Also note K is 3.0 today. Will return in am for PT eval. Durenda Hurt. Renaldo Fiddler, [facility] Pager [phone]

## 2011-07-16 NOTE — Progress Notes (Signed)
Subjective: The patient offers no new complaints this morning. He is able to voice some words. He was able to speak in some short phrases.  Objective:  Intake/Output Summary (Last 24 hours) at 07/16/11 0637 Last data filed at 07/16/11 0400  Gross per 24 hour  Intake    440 ml  Output      0 ml  Net    440 ml   BP 119/67  Pulse 57  Temp(Src) 99 F (37.2 C) (Oral)  Resp 20  Ht 5\' 10"  (1.778 m)  Wt 83.915 kg (185 lb)  BMI 26.54 kg/m2  SpO2 92% Neurological exam: The patient is easily awakened. All extremities move easily. Commands are followed intermittently and usually correctly. He is able to voice some words and phrases. He did express concern about his condition.  Lab Results:  [facility] 07/16/11 0335 07/15/11 2107 07/15/11 1111  NA 139 -- 139  K 3.0* -- 2.7*  CL 106 -- 101  CO2 23 -- 28  GLUCOSE 121* -- 106*  BUN 19 -- 20  CREATININE 0.99 0.89 --  CALCIUM 8.6 -- 9.1  MG 1.7 -- --  PHOS -- -- --    Basename 07/15/11 1111  AST 21  ALT 15  ALKPHOS 65  BILITOT 0.7  PROT 7.5  ALBUMIN 3.4*    Basename 07/15/11 2107 07/15/11 1111  WBC 8.4 7.6  NEUTROABS -- 5.3  HGB 13.9 14.1  HCT 40.6 40.6  MCV 89.4 89.6  PLT 164 169    Basename 07/16/11 0335 07/15/11 1935 07/15/11 1111  CKTOTAL 88 52 49  CKMB 2.9 2.3 2.3  CKMBINDEX -- -- --  TROPONINI <0.30 <0.30 <0.30   Basename 07/15/11 1111  HGBA1C 5.8*    Basename 07/16/11 0335 07/15/11 1111  CHOL 153 173  HDL 37* 46  LDLCALC 89 97  TRIG 137 148  CHOLHDL 4.1 3.8  LDLDIRECT -- --   Studies/Results: Ct Head Wo Contrast  07/15/2011  *RADIOLOGY REPORT*  Clinical Data: Aphasia, history of prostate cancer  CT HEAD WITHOUT CONTRAST  Technique:  Contiguous axial images were obtained from the base of the skull through the vertex without contrast.  Comparison: None  Findings: Generalized atrophy. Normal ventricular morphology. No midline shift or mass effect. Minimal small vessel chronic ischemic changes of deep  cerebral white matter. Large old posterior right temporoparietal infarct. Large area of abnormal decreased attenuation and loss of gray-white differentiation at the left temporoparietal lobe consistent with acute to subacute infarct. No intracranial hemorrhage or definite mass lesion identified. No definite mass effect is seen at the area of the parenchymal attenuation abnormality at the left temporoparietal lobe.  Left basal ganglia lacunar infarct. No additional areas of infarction identified. Posterior fossa normal appearance. Atherosclerotic calcifications of internal carotid arteries at skull base. Bones demineralized. No significant sinus opacification.  IMPRESSION: Atrophy with minimal small vessel chronic ischemic changes of deep cerebral white matter. Old right temporoparietal infarct. Acute to subacute left temporoparietal infarct. Lacunar infarct left basal ganglia.  Original Report Authenticated By: Lollie Marrow, M.D.   Medications: Scheduled Meds:   . aspirin  300 mg Rectal Daily   Or  . aspirin  325 mg Oral Daily  . doxycycline      . enoxaparin  40 mg Subcutaneous Q24H  . oxyCODONE-acetaminophen      . pantoprazole  40 mg Oral Q1200  . pneumococcal 23 valent vaccine  0.5 mL Intramuscular Tomorrow-1000  . potassium chloride  10 mEq Intravenous Q1 Hr  x 2  . potassium chloride  40 mEq Oral Once  . sertraline  100 mg Oral Daily  . simvastatin  20 mg Oral q1800  . sotalol  60 mg Oral BID   Continuous Infusions:  PRN Meds:.acetaminophen, acetaminophen, ondansetron (ZOFRAN) IV, senna-docusate, sodium chloride  Assessment/Plan: Principal Problem:  *CVA (cerebral infarction) - he has had a left temporoparietal stroke leaving him with significant speech deficit. There does not appear to be any problem swallowing. Based on comparisons to admitting physician's description, he appears to be improved. However, he is certainly not back to baseline. Workup will proceed and speech therapy and  other therapies will see patient today. Active Problems:  Aphasia S/P CVA - see above.   Hypokalemia - potassium is improved. We'll continue to replete.   LOS: 1 day   Logan Day 07/16/2011, 6:37 AM

## 2011-07-16 NOTE — Progress Notes (Signed)
Echocardiogram 2D Echocardiogram has been performed.  Logan Day 07/16/2011, 3:47 PM

## 2011-07-17 ENCOUNTER — Encounter (HOSPITAL_COMMUNITY): Payer: Self-pay | Admitting: Cardiology

## 2011-07-17 ENCOUNTER — Other Ambulatory Visit: Payer: Self-pay

## 2011-07-17 DIAGNOSIS — I4891 Unspecified atrial fibrillation: Secondary | ICD-10-CM | POA: Diagnosis present

## 2011-07-17 LAB — BASIC METABOLIC PANEL
BUN: 16 mg/dL (ref 6–23)
CO2: 23 mEq/L (ref 19–32)
Calcium: 8.8 mg/dL (ref 8.4–10.5)
Chloride: 108 mEq/L (ref 96–112)
Creatinine, Ser: 0.92 mg/dL (ref 0.50–1.35)
GFR calc Af Amer: >90 mL/min (ref >90)
GFR calc non Af Amer: 80 mL/min — ABNORMAL LOW (ref >90)
Glucose, Bld: 113 mg/dL — ABNORMAL HIGH (ref 70–99)
Potassium: 3.1 mEq/L — ABNORMAL LOW (ref 3.5–5.1)
Sodium: 140 mEq/L (ref 135–145)

## 2011-07-17 LAB — HEPARIN LEVEL (UNFRACTIONATED): Heparin Unfractionated: 0.14 IU/mL — ABNORMAL LOW (ref 0.30–0.70)

## 2011-07-17 MED ORDER — HEPARIN SOD (PORCINE) IN D5W 100 UNIT/ML IV SOLN
1250.0000 [IU]/h | INTRAVENOUS | Status: DC
  Administered 2011-07-17: 1200 [IU]/h via INTRAVENOUS
  Administered 2011-07-18: 1400 [IU]/h via INTRAVENOUS
  Administered 2011-07-18 – 2011-07-19 (×2): 1250 [IU]/h via INTRAVENOUS
  Filled 2011-07-17 (×6): qty 250

## 2011-07-17 MED ORDER — COUMADIN BOOK
Freq: Once | Status: AC
  Administered 2011-07-17: 15:00:00
  Filled 2011-07-17: qty 1

## 2011-07-17 MED ORDER — SOTALOL HCL 80 MG PO TABS
80.0000 mg | ORAL_TABLET | Freq: Two times a day (BID) | ORAL | Status: DC
  Administered 2011-07-18: 60 mg via ORAL
  Filled 2011-07-17 (×3): qty 1

## 2011-07-17 MED ORDER — WARFARIN VIDEO: Freq: Once | Status: DC

## 2011-07-17 MED ORDER — HALOPERIDOL LACTATE 5 MG/ML IJ SOLN
5.0000 mg | Freq: Once | INTRAMUSCULAR | Status: AC
  Administered 2011-07-17: 5 mg via INTRAVENOUS
  Filled 2011-07-17: qty 1

## 2011-07-17 MED ORDER — WARFARIN SODIUM 7.5 MG PO TABS
7.5000 mg | ORAL_TABLET | Freq: Once | ORAL | Status: AC
  Administered 2011-07-17 (×2): 7.5 mg via ORAL
  Filled 2011-07-17: qty 1

## 2011-07-17 MED ORDER — SODIUM CHLORIDE 0.9 % IV SOLN
INTRAVENOUS | Status: DC
  Administered 2011-07-17: 10 mL/h via INTRAVENOUS
  Administered 2011-07-21: via INTRAVENOUS

## 2011-07-17 MED ORDER — POTASSIUM CHLORIDE CRYS ER 20 MEQ PO TBCR
40.0000 meq | EXTENDED_RELEASE_TABLET | Freq: Two times a day (BID) | ORAL | Status: AC
  Administered 2011-07-17 – 2011-07-18 (×4): 40 meq via ORAL
  Filled 2011-07-17 (×5): qty 2

## 2011-07-17 NOTE — Consults (Signed)
Admit date: 07/15/2011 Referring Physician  Dr. Earl Gala Primary Physician Darnelle Bos, MD, MD Primary Cardiologist  Dr. Verdis Prime Reason for Consultation:  Evaluation of atrial fibrillation with rapid ventricular response in the setting of left-sided parietal stroke causing aphasia.  HPI: 77 year old male admitted with acute stroke. He is a history of atrial fibrillation, coronary artery disease status post PCI, prior TIA, hyperlipidemia, cost a cancer status post prostatectomy. Today, he is having atrial fibrillation with rapid ventricular response.Dr. Katrinka Blazing had had a conversation with him in 9/12 stating that he needed to choose between Coumadin or one of our newer oral anticoagulants and to call him back in 48 hours. Dr. Katrinka Blazing was very concerned about his stroke risk especially with recent TIA resulting in high CHADS score.   Earlier today for about 2 hours he was in AFIB with RVR, Now SB 55-60. Tele reviewed. Nurse gave him sotalol 60mg  dose early. I assume from this dose that Dr. Katrinka Blazing was unable to titrate sotalol due to bradycardia.   He was easily arousable but still aphasic shaking head yes to most questions.  Cath from 04/02/2008 -  1. Widely patent coronary arteries including the previously placed  distal circumflex stent. No obstructive lesions are felt to be  present.  2. Normal overall left ventricular function.  3. When compared to the prior study from 2002, no significant changes  occurred.  PMH:   Past Medical History  Diagnosis Date  . Hypertension   . Atrial fibrillation   . Hypercholesteremia   . MI (myocardial infarction)     x 2  . Angina   . Cancer     prostate cancer  . Coronary artery disease   . Prostate cancer   . Hyperlipidemia   . MI (myocardial infarction)   . TIA (transient ischemic attack)     PSH:   Past Surgical History  Procedure Date  . Prostayectomy   . Fracture surgery    Allergies:  Review of patient's allergies indicates no  known allergies. Prior to Admit Meds:   Prescriptions prior to admission  Medication Sig Dispense Refill  . hydrochlorothiazide (HYDRODIURIL) 25 MG tablet Take 25 mg by mouth every morning.        [name] omeprazole (PRILOSEC) 20 MG capsule Take 20 mg by mouth daily.        . pravastatin (PRAVACHOL) 40 MG tablet Take 40 mg by mouth daily.        . sertraline (ZOLOFT) 100 MG tablet Take 100 mg by mouth daily.        . sotalol (BETAPACE) 120 MG tablet Take 60 mg by mouth 2 (two) times daily.         Fam HX:    Family History  Problem Relation Age of Onset  . Coronary artery disease Son    Social HX:    History   Social History  . Marital Status: Widowed    Spouse Name: N/A    Number of Children: N/A  . Years of Education: N/A   Occupational History  . Not on file.   Social History Main Topics  . Smoking status: Former Smoker -- 1.0 packs/day for 40 years  . Smokeless tobacco: Current User    Types: Chew   Comment: quit smoking 2002  . Alcohol Use: No  . Drug Use: No  . Sexually Active: No   Other Topics Concern  . Not on file   Social History Narrative  . No narrative on  file     ROS:  All 11 ROS were addressed and are negative except what is stated in the HPI  Physical Exam: Blood pressure 124/86, pulse 124, temperature 98.1 F (36.7 C), temperature source Oral, resp. rate 18, height 5\' 10"  (1.778 m), weight 83.915 kg (185 lb), SpO2 96.00%.    General: Well developed, well nourished, in no acute distress, sleepy Head: Eyes PERRLA, No xanthomas.   Normal cephalic and atramatic  Lungs:   Clear bilaterally to auscultation and percussion. Normal respiratory effort. No wheezes, no rales. Heart:  Irreg Irreg, tachy Pulses are 2+ & equal.            No carotid bruit. No JVD.  No abdominal bruits. No femoral bruits. Abdomen: Bowel sounds are positive, abdomen soft and non-tender without masses or Hernia's noted. No hepatosplenomegaly. Msk:  Back normal, normal gait. Normal  strength and tone for age. Extremities:   No clubbing, cyanosis or edema.  DP +1 Neuro:  aphasic GU: Deferred Rectal: Deferred Psych:  Good affect, responds appropriately    Labs:   Lab Results  Component Value Date   WBC 8.4 07/15/2011   HGB 13.9 07/15/2011   HCT 40.6 07/15/2011   MCV 89.4 07/15/2011   PLT 164 07/15/2011    Lab 07/17/11 0700 07/15/11 1111  NA 140 --  K 3.1* --  CL 108 --  CO2 23 --  BUN 16 --  CREATININE 0.92 --  CALCIUM 8.8 --  PROT -- 7.5  BILITOT -- 0.7  ALKPHOS -- 65  ALT -- 15  AST -- 21  GLUCOSE 113* --   No results found for this basename: PTT   Lab Results  Component Value Date   INR 1.08 07/15/2011   Lab Results  Component Value Date   CKTOTAL 151 07/16/2011   CKMB 3.6 07/16/2011   TROPONINI <0.30 07/16/2011     Lab Results  Component Value Date   CHOL 153 07/16/2011   CHOL 173 07/15/2011   Lab Results  Component Value Date   HDL 37* 07/16/2011   HDL 46 07/15/2011   Lab Results  Component Value Date   LDLCALC 89 07/16/2011   LDLCALC 97 07/15/2011   Lab Results  Component Value Date   TRIG 137 07/16/2011   TRIG 148 07/15/2011   Lab Results  Component Value Date   CHOLHDL 4.1 07/16/2011   CHOLHDL 3.8 07/15/2011   No results found for this basename: LDLDIRECT      Radiology:  Mr Brain Wo Contrast  07/16/2011  *RADIOLOGY REPORT*  Clinical Data: Slurred speech with altered mental status.  MRI HEAD WITHOUT CONTRAST  Technique:  Multiplanar, multiecho pulse sequences of the brain and surrounding structures were obtained according to standard protocol without intravenous contrast.  Comparison: 07/15/2011 CT.  No comparison MR.  Findings: The patient was only able to complete the four sequences and requested the exam be terminated.  Moderate sized acute infarct left mid to posterior temporal lobe, posterior left sub insular/periopercular region, left parietal lobe and posterior left temporal lobe.  No obvious hemorrhage  although gradient sequence was not performed.  Local mass effect. Thrombosed left middle cerebral artery branch vessels suspected. Vertebral arteries, basilar artery and internal carotid arteries (at the level of the cavernous sinus) are patent.  Prior right parietal - temporal lobe infarct with encephalomalacia.  Prominent perivascular space left basal ganglia.  Small vessel disease type changes.  Global atrophy without hydrocephalus.  No intracranial mass lesion detected on  this unenhanced motion degraded exam.  Paranasal sinus mucosal thickening.  Mild spinal stenosis C3-4.  Degenerative changes C1-2.  IMPRESSION: Motion degraded exam.  The patient was only able to complete the four sequences and requested the exam be terminated.  Moderate sized acute infarct left mid to posterior temporal lobe, posterior left sub insular/periopercular region, left parietal lobe and posterior left temporal lobe.  Original Report Authenticated By: Fuller Canada, M.D.    EKG:  SB 53, first degree AVB, PVC on 12/13 personally viewed. ECHO: EF 50% no embolic source.   ASSESSMENT/PLAN:    Atrial fibrillation with rapid ventricular response - parox. Dr. Katrinka Blazing describes this in prior office visit. While he is on tele, I will try to increase sotalol from 60 to 80mg  BID. When he is converted, bradycardic.   Acute left MCA territory stroke - agree with anticoagulation. Will start coumadin. Dr. Earl Gala placed on heparin IV. Watch closely for any signs of hemorrhagic conversion.   Hypokalemia- replete per Dr. Earl Gala  Coronary artery disease - EF 50%. Stable. No angina. Circ stent patent in 2009.   Will relay to Dr. Katrinka Blazing.   Donato Schultz, MD  07/17/2011  1:10 PM

## 2011-07-17 NOTE — Progress Notes (Signed)
ANTICOAGULATION CONSULT NOTE - Initial Consult  Pharmacy Consult for: Heparin Indication: Afib  No Known Allergies  Patient Measurements: Height: 5\' 10"  (177.8 cm) Weight: 185 lb (83.915 kg) IBW/kg (Calculated) : 73   Vital Signs: Temp: 98.1 F (36.7 C) (12/15 1040) Temp src: Oral (12/15 1040) BP: 124/86 mmHg (12/15 1040) Pulse Rate: 124  (12/15 1040)  Labs:  Basename 07/17/11 0700 07/16/11 1145 07/16/11 0335 07/15/11 2107 07/15/11 1935 07/15/11 1111  HGB -- -- -- 13.9 -- 14.1  HCT -- -- -- 40.6 -- 40.6  PLT -- -- -- 164 -- 169  APTT -- -- -- -- -- 30  LABPROT -- -- -- -- -- 14.2  INR -- -- -- -- -- 1.08  HEPARINUNFRC -- -- -- -- -- --  CREATININE 0.92 -- 0.99 0.89 -- --  CKTOTAL -- 151 88 -- 52 --  CKMB -- 3.6 2.9 -- 2.3 --  TROPONINI -- <0.30 <0.30 -- <0.30 --   Estimated Creatinine Clearance: 70.5 ml/min (by C-G formula based on Cr of 0.92).  Medical History: Past Medical History  Diagnosis Date  . Hypertension   . Atrial fibrillation   . Hypercholesteremia   . MI (myocardial infarction)     x 2  . Angina   . Cancer     prostate cancer  . Coronary artery disease   . Prostate cancer   . Hyperlipidemia   . MI (myocardial infarction)   . TIA (transient ischemic attack)     Medications:  Prescriptions prior to admission  Medication Sig Dispense Refill  . hydrochlorothiazide (HYDRODIURIL) 25 MG tablet Take 25 mg by mouth every morning.        [name] omeprazole (PRILOSEC) 20 MG capsule Take 20 mg by mouth daily.        . pravastatin (PRAVACHOL) 40 MG tablet Take 40 mg by mouth daily.        . sertraline (ZOLOFT) 100 MG tablet Take 100 mg by mouth daily.        . sotalol (BETAPACE) 120 MG tablet Take 60 mg by mouth 2 (two) times daily.         Assessment: 78yom with afib (no anticoagulants PTA) admitted with new CVA. He will begin heparin for afib with no bolus 2/2 new CVA.  Goal of Therapy:  Heparin level 0.3-0.5   Plan:  1) Begin heparin at 1200 units/hr  with NO bolus 2) 8h heparin level 3) Daily heparin level and CBC  Fredrik Rigger 07/17/2011,12:56 PM

## 2011-07-17 NOTE — Progress Notes (Signed)
ANTICOAGULATION CONSULT NOTE - Follow Up Consult  Pharmacy Consult for heparin Indication: atrial fibrillation  No Known Allergies  Patient Measurements: Height: 5\' 10"  (177.8 cm) Weight: 185 lb (83.915 kg) IBW/kg (Calculated) : 73   Vital Signs: Temp: 98.3 F (36.8 C) (12/15 1526) Temp src: Oral (12/15 1526) BP: 126/82 mmHg (12/15 1526) Pulse Rate: 126  (12/15 1526)  Labs:  Basename 07/17/11 2132 07/17/11 0700 07/16/11 1145 07/16/11 0335 07/15/11 2107 07/15/11 1935 07/15/11 1111  HGB -- -- -- -- 13.9 -- 14.1  HCT -- -- -- -- 40.6 -- 40.6  PLT -- -- -- -- 164 -- 169  APTT -- -- -- -- -- -- 30  LABPROT -- -- -- -- -- -- 14.2  INR -- -- -- -- -- -- 1.08  HEPARINUNFRC 0.14* -- -- -- -- -- --  CREATININE -- 0.92 -- 0.99 0.89 -- --  CKTOTAL -- -- 151 88 -- 52 --  CKMB -- -- 3.6 2.9 -- 2.3 --  TROPONINI -- -- <0.30 <0.30 -- <0.30 --   Estimated Creatinine Clearance: 70.5 ml/min (by C-G formula based on Cr of 0.92).   Medications:  Infusions:    . sodium chloride 10 mL/hr (07/17/11 1546)  . heparin 1,200 Units/hr (07/17/11 1403)    Assessment: 78 yom started on IV heparin and coumadin for afib. Also noted with acute CVA so will not bolus. Heparin level is 0.14 which is subtherapeutic. No bleeding or other problems noted.   Goal of Therapy:  Heparin level 0.3-0.5   Plan:  Increase heparin to 1400units/hr Check 8 hour heparin level  Lori Popowski, Drake Leach 07/17/2011,10:23 PM

## 2011-07-17 NOTE — Progress Notes (Signed)
OT NOTE:  Pt remains on bedrest.  Potassium at 3.1.  OT eval deferred.  Will continue to monitor and perform eval when appropriate. 07/17/2011 Martie Round, OTR/L Pager: [phone]

## 2011-07-17 NOTE — Progress Notes (Signed)
Subjective: He has not complaints. He is saying yes and no to some questions and he is following commands better according to family present.   Objective:  Intake/Output Summary (Last 24 hours) at 07/17/11 1058 Last data filed at 07/16/11 1330  Gross per 24 hour  Intake    100 ml  Output      0 ml  Net    100 ml   BP 124/86  Pulse 124  Temp(Src) 98.1 F (36.7 C) (Oral)  Resp 18  Ht 5\' 10"  (1.778 m)  Wt 83.915 kg (185 lb)  BMI 26.54 kg/m2  SpO2 96% Neurologic: Says yes and no to some questions. Says he is worried about whether he will get better.  Oral: tongue looks dry  Lab Results:  Basename 07/17/11 0700 07/16/11 0335  NA 140 139  K 3.1* 3.0*  CL 108 106  CO2 23 23  GLUCOSE 113* 121*  BUN 16 19  CREATININE 0.92 0.99  CALCIUM 8.8 8.6  MG -- 1.7  PHOS -- --    Basename 07/15/11 1111  AST 21  ALT 15  ALKPHOS 65  BILITOT 0.7  PROT 7.5  ALBUMIN 3.4*    Basename 07/15/11 2107 07/15/11 1111  WBC 8.4 7.6  NEUTROABS -- 5.3  HGB 13.9 14.1  HCT 40.6 40.6  MCV 89.4 89.6  PLT 164 169    Basename 07/16/11 1145 07/16/11 0335 07/15/11 1935  CKTOTAL 151 88 52  CKMB 3.6 2.9 2.3  CKMBINDEX -- -- --  TROPONINI <0.30 <0.30 <0.30    Basename 07/16/11 0335 07/15/11 1111  HGBA1C 6.0* 5.8*    Basename 07/16/11 0335 07/15/11 1111  CHOL 153 173  HDL 37* 46  LDLCALC 89 97  TRIG 137 148  CHOLHDL 4.1 3.8  LDLDIRECT -- --    Studies/Results: Ct Head Wo Contrast  07/15/2011  *RADIOLOGY REPORT*  Clinical Data: Aphasia, history of prostate cancer  CT HEAD WITHOUT CONTRAST  Technique:  Contiguous axial images were obtained from the base of the skull through the vertex without contrast.  Comparison: None  Findings: Generalized atrophy. Normal ventricular morphology. No midline shift or mass effect. Minimal small vessel chronic ischemic changes of deep cerebral white matter. Large old posterior right temporoparietal infarct. Large area of abnormal decreased attenuation  and loss of gray-white differentiation at the left temporoparietal lobe consistent with acute to subacute infarct. No intracranial hemorrhage or definite mass lesion identified. No definite mass effect is seen at the area of the parenchymal attenuation abnormality at the left temporoparietal lobe.  Left basal ganglia lacunar infarct. No additional areas of infarction identified. Posterior fossa normal appearance. Atherosclerotic calcifications of internal carotid arteries at skull base. Bones demineralized. No significant sinus opacification.  IMPRESSION: Atrophy with minimal small vessel chronic ischemic changes of deep cerebral white matter. Old right temporoparietal infarct. Acute to subacute left temporoparietal infarct. Lacunar infarct left basal ganglia.  Original Report Authenticated By: Lollie Marrow, M.D.   Mr Brain Wo Contrast  07/16/2011  *RADIOLOGY REPORT*  Clinical Data: Slurred speech with altered mental status.  MRI HEAD WITHOUT CONTRAST  Technique:  Multiplanar, multiecho pulse sequences of the brain and surrounding structures were obtained according to standard protocol without intravenous contrast.  Comparison: 07/15/2011 CT.  No comparison MR.  Findings: The patient was only able to complete the four sequences and requested the exam be terminated.  Moderate sized acute infarct left mid to posterior temporal lobe, posterior left sub insular/periopercular region, left parietal lobe and posterior  left temporal lobe.  No obvious hemorrhage although gradient sequence was not performed.  Local mass effect. Thrombosed left middle cerebral artery branch vessels suspected. Vertebral arteries, basilar artery and internal carotid arteries (at the level of the cavernous sinus) are patent.  Prior right parietal - temporal lobe infarct with encephalomalacia.  Prominent perivascular space left basal ganglia.  Small vessel disease type changes.  Global atrophy without hydrocephalus.  No intracranial mass  lesion detected on this unenhanced motion degraded exam.  Paranasal sinus mucosal thickening.  Mild spinal stenosis C3-4.  Degenerative changes C1-2.  IMPRESSION: Motion degraded exam.  The patient was only able to complete the four sequences and requested the exam be terminated.  Moderate sized acute infarct left mid to posterior temporal lobe, posterior left sub insular/periopercular region, left parietal lobe and posterior left temporal lobe.  Original Report Authenticated By: Fuller Canada, M.D.   Medications: Scheduled Meds:   . aspirin  300 mg Rectal Daily   Or  . aspirin  325 mg Oral Daily  . enoxaparin  40 mg Subcutaneous Q24H  . haloperidol lactate  5 mg Intravenous Once  . LORazepam      . LORazepam  0.5 mg Intravenous Once  . pantoprazole  40 mg Oral Q1200  . pneumococcal 23 valent vaccine  0.5 mL Intramuscular Tomorrow-1000  . potassium chloride  40 mEq Oral Daily  . sertraline  100 mg Oral Daily  . simvastatin  20 mg Oral q1800  . sotalol  60 mg Oral BID   Continuous Infusions:  PRN Meds:.acetaminophen, acetaminophen, ondansetron (ZOFRAN) IV, senna-docusate, sodium chloride  Assessment/Plan: Principal Problem:  *CVA (cerebral infarction) - MRI confirms stroke and may be consistent with multiple recent strokes (movement degraded images). He is improving slightly. He needs to start on anticoag and I will start heparin with no bolus given degree of stroke. Long term decision between warfarin and Xarelto needs to be made. His cardiologist is Dr. Katrinka Blazing and at beginning of week we will make decision.  Active Problems:  Aphasia S/P CVA  Hypokalemia - continue repletion.   Atrial fibrillation - rate is fast right now. HOwever, son gave him some caffeinated coffee and he usually does not drink caffeine.    LOS: 2 days   Logan Day Logan Day 07/17/2011, 10:58 AM

## 2011-07-17 NOTE — Progress Notes (Signed)
Called to see patient for rapid heart rate with underlying AFib. MD has been notified. Pt admitted with stroke symptoms 12/13. Cardiac history includes stent, MI x2, HTN, and Afib. On Betapace. Pt is asymptomatic-sleeping but arousable. Manual BP left arm 112/70. EKG done by staff confirms A fib as does telemtry monitor. Recommend giving Betapace; will assist with monitoring.

## 2011-07-17 NOTE — Progress Notes (Signed)
PT Cancellation Note Patient remains on bedrest per MD orders.  K is 3.1 and patient in Afib.  Will hold Physical Therapy today.  Will return in am to attempt evaluation. Durenda Hurt Renaldo Fiddler, [facility] Acute Rehab Services Pager [phone]

## 2011-07-17 NOTE — Progress Notes (Signed)
Pharmacy Addendum: Coumadin  78yom known to pharmacy for previous heparin dosing today now to begin coumadin for secondary stroke prevention. Baseline INR 1.08. Coumadin score=5.  Plan:  1) Coumadin 7.5mg  x 1 2) Daily INR 3) Coumadin education: book/video

## 2011-07-18 ENCOUNTER — Other Ambulatory Visit (HOSPITAL_COMMUNITY): Payer: Medicare Other

## 2011-07-18 LAB — URINALYSIS, ROUTINE W REFLEX MICROSCOPIC
Glucose, UA: NEGATIVE mg/dL
Nitrite: POSITIVE — AB
Specific Gravity, Urine: 1.015 (ref 1.005–1.030)
pH: 6 (ref 5.0–8.0)

## 2011-07-18 LAB — BASIC METABOLIC PANEL
BUN: 16 mg/dL (ref 6–23)
CO2: 23 mEq/L (ref 19–32)
Calcium: 9.2 mg/dL (ref 8.4–10.5)
Chloride: 106 mEq/L (ref 96–112)
Creatinine, Ser: 0.99 mg/dL (ref 0.50–1.35)
GFR calc Af Amer: 90 mL/min — ABNORMAL LOW (ref >90)
GFR calc non Af Amer: 78 mL/min — ABNORMAL LOW (ref >90)
Glucose, Bld: 107 mg/dL — ABNORMAL HIGH (ref 70–99)
Potassium: 4.1 mEq/L (ref 3.5–5.1)
Sodium: 137 mEq/L (ref 135–145)

## 2011-07-18 LAB — CBC
HCT: 37.9 % — ABNORMAL LOW (ref 39.0–52.0)
Hemoglobin: 12.8 g/dL — ABNORMAL LOW (ref 13.0–17.0)
MCH: 30.3 pg (ref 26.0–34.0)
MCHC: 33.8 g/dL (ref 30.0–36.0)
MCV: 89.8 fL (ref 78.0–100.0)
Platelets: 157 10*3/uL (ref 150–400)
RBC: 4.22 MIL/uL (ref 4.22–5.81)
RDW: 13.8 % (ref 11.5–15.5)
WBC: 6.8 10*3/uL (ref 4.0–10.5)

## 2011-07-18 LAB — HEPARIN LEVEL (UNFRACTIONATED)
Heparin Unfractionated: 0.57 IU/mL (ref 0.30–0.70)
Heparin Unfractionated: 0.58 IU/mL (ref 0.30–0.70)

## 2011-07-18 LAB — PROTIME-INR
INR: 1.22 (ref 0.00–1.49)
Prothrombin Time: 15.7 seconds — ABNORMAL HIGH (ref 11.6–15.2)

## 2011-07-18 MED ORDER — CIPROFLOXACIN HCL 250 MG PO TABS
250.0000 mg | ORAL_TABLET | Freq: Two times a day (BID) | ORAL | Status: DC
  Filled 2011-07-18 (×2): qty 1

## 2011-07-18 MED ORDER — CIPROFLOXACIN HCL 250 MG PO TABS
250.0000 mg | ORAL_TABLET | Freq: Two times a day (BID) | ORAL | Status: AC
  Administered 2011-07-18 – 2011-07-23 (×10): 250 mg via ORAL
  Filled 2011-07-18 (×10): qty 1

## 2011-07-18 MED ORDER — WARFARIN SODIUM 7.5 MG PO TABS
7.5000 mg | ORAL_TABLET | Freq: Once | ORAL | Status: AC
  Administered 2011-07-18: 7.5 mg via ORAL
  Filled 2011-07-18: qty 1

## 2011-07-18 MED ORDER — SOTALOL HCL 120 MG PO TABS
60.0000 mg | ORAL_TABLET | Freq: Two times a day (BID) | ORAL | Status: DC
  Administered 2011-07-18 – 2011-07-23 (×6): 60 mg via ORAL
  Filled 2011-07-18 (×12): qty 0.5

## 2011-07-18 NOTE — Progress Notes (Signed)
Physical Therapy Evaluation Patient Details Name: Logan Day MRN: [id number] DOB: October 14, 1932 Today's Date: 07/18/2011  Problem List:  Patient Active Problem List  Diagnoses  . CVA (cerebral infarction)  . Aphasia S/P CVA  . Hypokalemia  . Atrial fibrillation    Past Medical History:  Past Medical History  Diagnosis Date  . Hypertension   . Atrial fibrillation   . Hypercholesteremia   . MI (myocardial infarction)     x 2  . Angina   . Cancer     prostate cancer  . Coronary artery disease     Circ stent (2009 cath patent)  . Prostate cancer   . Hyperlipidemia   . MI (myocardial infarction)   . TIA (transient ischemic attack)    Past Surgical History:  Past Surgical History  Procedure Date  . Prostayectomy   . Fracture surgery     PT Assessment/Plan/Recommendation PT Assessment Clinical Impression Statement: Patient is a 77 yo male admitted with left CVA, with decreased cognition, aphasia, and decreased balance.  Patient was very independent prior to admission.  Patient does not have 24 hour assist and would be unsafe to be at home alone currently.  Would recommend ST-SNF for continued therapy at discharge, and re-evaluate return to home from there. PT Recommendation/Assessment: Patient will need skilled PT in the acute care venue PT Problem List: Decreased activity tolerance;Decreased balance;Decreased mobility;Decreased cognition;Decreased safety awareness;Cardiopulmonary status limiting activity Barriers to Discharge: Decreased caregiver support PT Therapy Diagnosis : Abnormality of gait PT Plan PT Frequency: Min 4X/week PT Treatment/Interventions: DME instruction;Gait training;Functional mobility training;Therapeutic activities;Balance training;Cognitive remediation;Patient/family education PT Recommendation Follow Up Recommendations: Skilled nursing facility Equipment Recommended: Defer to next venue PT Goals  Acute Rehab PT Goals PT Goal Formulation:  With patient/family Time For Goal Achievement: 2 weeks Pt will go Supine/Side to Sit: Independently;with HOB 0 degrees PT Goal: Supine/Side to Sit - Progress: Not met Pt will go Sit to Supine/Side: Independently;with HOB 0 degrees PT Goal: Sit to Supine/Side - Progress: Not met Pt will go Sit to Stand: with supervision (with good balance) PT Goal: Sit to Stand - Progress: Not met Pt will go Stand to Sit: with supervision (with good balance) PT Goal: Stand to Sit - Progress: Not met Pt will Ambulate: >150 feet;with supervision;with least restrictive assistive device;with gait velocity >(comment) ft/second (4 ft/sec without loss of balance) PT Goal: Ambulate - Progress: Not met Additional Goals Additional Goal #1: Patient will score > 20 on DGI balance assessment to indicate low fall risk. PT Goal: Additional Goal #1 - Progress: Not met  PT Evaluation Precautions/Restrictions  Precautions Precautions: Fall Prior Functioning  Home Living Lives With: Alone Receives Help From: Family (24 hour assist not available per son) Type of Home: House Home Layout: One level Home Access: Stairs to enter Entrance Stairs-Rails: Lawyer of Steps: 3 Home Adaptive Equipment: None Prior Function Level of Independence: Independent with basic ADLs;Independent with homemaking with ambulation;Independent with gait Driving: Yes Vocation: Retired Producer, television/film/video: Awake/alert (Restless) Overall Cognitive Status: Impaired Attention: Impaired Current Attention Level: Sustained Orientation Level: Oriented to person (Difficult to assess due to language) Following Commands: Follows one step commands inconsistently (Difficulty with verbal commands; better with visual cues) Safety/Judgement: Decreased awareness of safety precautions;Decreased safety judgement for tasks assessed Decreased Safety/Judgement: Impulsive;Decreased awareness of need for  assistance Safety/Judgement - Other Comments: Decreased awareness of balance deficits, trying to get out of bed on his own Awareness of Deficits: Decreased awareness of deficits Problem Solving:  Requires assistance for problem solving Sensation/Coordination Sensation Light Touch: Not tested (Due to language difficulty) Coordination Gross Motor Movements are Fluid and Coordinated: Yes Extremity Assessment RUE Assessment RUE Assessment: Within Functional Limits LUE Assessment LUE Assessment: Within Functional Limits RLE Assessment RLE Assessment: Within Functional Limits LLE Assessment LLE Assessment: Within Functional Limits Mobility (including Balance) Bed Mobility Bed Mobility: Yes Supine to Sit: 5: Supervision;HOB flat Supine to Sit Details (indicate cue type and reason): Supervision for safety Sitting - Scoot to Edge of Bed: 5: Supervision Sitting - Scoot to Edge of Bed Details (indicate cue type and reason): Supervision for safety. No cues needed Sit to Supine - Left: 5: Supervision;HOB flat Sit to Supine - Left Details (indicate cue type and reason): No cues needed. Transfers Transfers: Yes Sit to Stand: 4: Min assist;With upper extremity assist;From bed;From toilet Sit to Stand Details (indicate cue type and reason): Assistance for balance and safety.  Tactile cues to use rail in bathroom. Stand to Sit: 4: Min assist;With upper extremity assist;To bed;To toilet Stand to Sit Details: Assistance for balance and safety. Ambulation/Gait Ambulation/Gait: Yes Ambulation/Gait Assistance: 4: Min assist Ambulation/Gait Assistance Details (indicate cue type and reason): Hand-hold assist for balance. Cues to move at slower safe pace. Ambulation Distance (Feet): 120 Feet Assistive device: 1 person hand held assist Gait Pattern: Step-through pattern;Trunk flexed (Staggering gait at times.)  Balance Balance Assessed: Yes High Level Balance High Level Balance Activites: Turns;Sudden  stops;Head turns High Level Balance Comments: Slight loss of balance with above activities, requiring min assist to regain balance.   Exercise    End of Session PT - End of Session Equipment Utilized During Treatment: Gait belt Activity Tolerance: Patient limited by fatigue Patient left: in bed;with call bell in reach;with bed alarm set;with family/visitor present Nurse Communication: Mobility status for ambulation General Behavior During Session: Restless Cognition: Impaired Cognitive Impairment: Decreased awareness of deficits and decreased safety awareness  Vena Austria [phone] 07/18/2011, 4:42 PM

## 2011-07-18 NOTE — Progress Notes (Signed)
Subjective: Patient can understand a few words and follow some commands. However, he cannot understand that he had a stroke. He is weeping inconsolably at times and is apologizing for not being able to understand me.   Objective:  Intake/Output Summary (Last 24 hours) at 07/18/11 0956 Last data filed at 07/18/11 0200  Gross per 24 hour  Intake    440 ml  Output    450 ml  Net    -10 ml   BP 147/64  Pulse 55  Temp(Src) 99.3 F (37.4 C) (Oral)  Resp 20  Ht 5\' 10"  (1.778 m)  Wt 83.915 kg (185 lb)  BMI 26.54 kg/m2  SpO2 98% Neurologic: Alert. Follows simple commands at times. Verbally expresses frustration and is crying. Moves all extremities normally.  Lab Results:  [facility] 07/18/11 0619 07/17/11 0700 07/16/11 0335  NA 137 140 --  K 4.1 3.1* --  CL 106 108 --  CO2 23 23 --  GLUCOSE 107* 113* --  BUN 16 16 --  CREATININE 0.99 0.92 --  CALCIUM 9.2 8.8 --  MG -- -- 1.7  PHOS -- -- --    Basename 07/15/11 1111  AST 21  ALT 15  ALKPHOS 65  BILITOT 0.7  PROT 7.5  ALBUMIN 3.4*   No results found for this basename: LIPASE:2,AMYLASE:2 in the last 72 hours  Basename 07/18/11 0619 07/15/11 2107 07/15/11 1111  WBC 6.8 8.4 --  NEUTROABS -- -- 5.3  HGB 12.8* 13.9 --  HCT 37.9* 40.6 --  MCV 89.8 89.4 --  PLT 157 164 --    Basename 07/16/11 1145 07/16/11 0335 07/15/11 1935  CKTOTAL 151 88 52  CKMB 3.6 2.9 2.3  CKMBINDEX -- -- --  TROPONINI <0.30 <0.30 <0.30   No components found with this basename: POCBNP:3 No results found for this basename: DDIMER:2 in the last 72 hours  Basename 07/16/11 0335 07/15/11 1111  HGBA1C 6.0* 5.8*    Basename 07/16/11 0335 07/15/11 1111  CHOL 153 173  HDL 37* 46  LDLCALC 89 97  TRIG 137 148  CHOLHDL 4.1 3.8  LDLDIRECT -- --   No results found for this basename: TSH,T4TOTAL,FREET3,T3FREE,THYROIDAB in the last 72 hours No results found for this basename: VITAMINB12:2,FOLATE:2,FERRITIN:2,TIBC:2,IRON:2,RETICCTPCT:2 in the last 72  hours  Studies/Results: Mr Brain Wo Contrast  07/16/2011  *RADIOLOGY REPORT*  Clinical Data: Slurred speech with altered mental status.  MRI HEAD WITHOUT CONTRAST  Technique:  Multiplanar, multiecho pulse sequences of the brain and surrounding structures were obtained according to standard protocol without intravenous contrast.  Comparison: 07/15/2011 CT.  No comparison MR.  Findings: The patient was only able to complete the four sequences and requested the exam be terminated.  Moderate sized acute infarct left mid to posterior temporal lobe, posterior left sub insular/periopercular region, left parietal lobe and posterior left temporal lobe.  No obvious hemorrhage although gradient sequence was not performed.  Local mass effect. Thrombosed left middle cerebral artery branch vessels suspected. Vertebral arteries, basilar artery and internal carotid arteries (at the level of the cavernous sinus) are patent.  Prior right parietal - temporal lobe infarct with encephalomalacia.  Prominent perivascular space left basal ganglia.  Small vessel disease type changes.  Global atrophy without hydrocephalus.  No intracranial mass lesion detected on this unenhanced motion degraded exam.  Paranasal sinus mucosal thickening.  Mild spinal stenosis C3-4.  Degenerative changes C1-2.  IMPRESSION: Motion degraded exam.  The patient was only able to complete the four sequences and requested the exam  be terminated.  Moderate sized acute infarct left mid to posterior temporal lobe, posterior left sub insular/periopercular region, left parietal lobe and posterior left temporal lobe.  Original Report Authenticated By: Fuller Canada, M.D.   Medications: Scheduled Meds:   . aspirin  300 mg Rectal Daily   Or  . aspirin  325 mg Oral Daily  . coumadin book   Does not apply Once  . LORazepam  0.5 mg Intravenous Once  . pantoprazole  40 mg Oral Q1200  . pneumococcal 23 valent vaccine  0.5 mL Intramuscular Tomorrow-1000  .  potassium chloride  40 mEq Oral BID  . sertraline  100 mg Oral Daily  . simvastatin  20 mg Oral q1800  . sotalol  80 mg Oral BID  . warfarin  7.5 mg Oral ONCE-1800  . warfarin   Does not apply Once  . DISCONTD: enoxaparin  40 mg Subcutaneous Q24H  . DISCONTD: sotalol  60 mg Oral BID   Continuous Infusions:   . sodium chloride 10 mL/hr (07/17/11 1546)  . heparin 1,350 Units/hr (07/18/11 0712)   PRN Meds:.acetaminophen, acetaminophen, ondansetron (ZOFRAN) IV, senna-docusate, sodium chloride  Assessment/Plan: Principal Problem:  *CVA (cerebral infarction) - this is a devastating stroke at this point. He is improving some. I can only hope he will improve enough to be able to understand more of what is happening. He is able to get out of bed with assistance to participate with PT, OT. ST has not seen patient yet.  Active Problems:  Aphasia S/P CVA  Hypokalemia  Atrial fibrillation - appreciate cards involvement. He is back in a normal rhythm today.    LOS: 3 days   Logan Day,Logan Day 07/18/2011, 9:56 AM

## 2011-07-18 NOTE — Progress Notes (Signed)
ANTICOAGULATION CONSULT NOTE - Follow Up Consult  Pharmacy Consult for heparin Indication: atrial fibrillation and acute CVA  No Known Allergies  Patient Measurements: Height: 5\' 10"  (177.8 cm) Weight: 185 lb (83.915 kg) IBW/kg (Calculated) : 73   Vital Signs: Temp: 99.3 F (37.4 C) (12/16 0542) Temp src: Oral (12/16 0542) BP: 147/64 mmHg (12/16 0542) Pulse Rate: 55  (12/16 0542)  Labs:  Basename 07/18/11 0619 07/17/11 2132 07/17/11 0700 07/16/11 1145 07/16/11 0335 07/15/11 2107 07/15/11 1935 07/15/11 1111  HGB 12.8* -- -- -- -- 13.9 -- --  HCT 37.9* -- -- -- -- 40.6 -- 40.6  PLT 157 -- -- -- -- 164 -- 169  APTT -- -- -- -- -- -- -- 30  LABPROT 15.7* -- -- -- -- -- -- 14.2  INR 1.22 -- -- -- -- -- -- 1.08  HEPARINUNFRC 0.57 0.14* -- -- -- -- -- --  CREATININE -- -- 0.92 -- 0.99 0.89 -- --  CKTOTAL -- -- -- 151 88 -- 52 --  CKMB -- -- -- 3.6 2.9 -- 2.3 --  TROPONINI -- -- -- <0.30 <0.30 -- <0.30 --   Estimated Creatinine Clearance: 70.5 ml/min (by C-G formula based on Cr of 0.92).   Medications:  Infusions:    . sodium chloride 10 mL/hr (07/17/11 1546)  . heparin 1,400 Units/hr (07/18/11 9562)    Assessment: 78 yom on IV heparin and coumadin for afib. Heparin level is slightly above goal due to lower goal since he has an acute CVA. No bleeding or other problems noted.   Goal of Therapy:  Heparin level 0.3-0.5   Plan:  Decrease heparin to 1350units/hr Check 8 hour heparin level  Devika Dragovich, Drake Leach 07/18/2011,6:56 AM

## 2011-07-18 NOTE — Progress Notes (Addendum)
Subjective:  Confused and frustrated. Last night, nurse held the sotalol. No CP, no SOB. Can't understand verbal commands.   Objective:  Vital Signs in the last 24 hours: Temp:  [97.8 F (36.6 C)-99.3 F (37.4 C)] 99.3 F (37.4 C) (12/16 0542) Pulse Rate:  [54-126] 55  (12/16 0542) Resp:  [18-20] 20  (12/16 0542) BP: (124-147)/(55-86) 147/64 mmHg (12/16 0542) SpO2:  [95 %-98 %] 98 % (12/16 0542)  Intake/Output from previous day: 12/15 0701 - 12/16 0700 In: 440 [P.O.:440] Out: 450 [Urine:450]   Physical Exam: Gen: alert in NAD CV: Huston Foley RR Lungs: CTA Abd: soft. Ext. No edema   Lab Results:  [facility] 07/18/11 0454 07/15/11 2107  WBC 6.8 8.4  HGB 12.8* 13.9  PLT 157 164    Basename 07/18/11 0619 07/17/11 0700  NA 137 140  K 4.1 3.1*  CL 106 108  CO2 23 23  GLUCOSE 107* 113*  BUN 16 16  CREATININE 0.99 0.92    Basename 07/16/11 1145 07/16/11 0335  TROPONINI <0.30 <0.30   Hepatic Function Panel  Basename 07/15/11 1111  PROT 7.5  ALBUMIN 3.4*  AST 21  ALT 15  ALKPHOS 65  BILITOT 0.7  BILIDIR --  IBILI --    Basename 07/16/11 0335  CHOL 153   No results found for this basename: PROTIME in the last 72 hours  Imaging: Mr Brain Wo Contrast  07/16/2011  *RADIOLOGY REPORT*  Clinical Data: Slurred speech with altered mental status.  MRI HEAD WITHOUT CONTRAST  Technique:  Multiplanar, multiecho pulse sequences of the brain and surrounding structures were obtained according to standard protocol without intravenous contrast.  Comparison: 07/15/2011 CT.  No comparison MR.  Findings: The patient was only able to complete the four sequences and requested the exam be terminated.  Moderate sized acute infarct left mid to posterior temporal lobe, posterior left sub insular/periopercular region, left parietal lobe and posterior left temporal lobe.  No obvious hemorrhage although gradient sequence was not performed.  Local mass effect. Thrombosed left middle cerebral  artery branch vessels suspected. Vertebral arteries, basilar artery and internal carotid arteries (at the level of the cavernous sinus) are patent.  Prior right parietal - temporal lobe infarct with encephalomalacia.  Prominent perivascular space left basal ganglia.  Small vessel disease type changes.  Global atrophy without hydrocephalus.  No intracranial mass lesion detected on this unenhanced motion degraded exam.  Paranasal sinus mucosal thickening.  Mild spinal stenosis C3-4.  Degenerative changes C1-2.  IMPRESSION: Motion degraded exam.  The patient was only able to complete the four sequences and requested the exam be terminated.  Moderate sized acute infarct left mid to posterior temporal lobe, posterior left sub insular/periopercular region, left parietal lobe and posterior left temporal lobe.  Original Report Authenticated By: Fuller Canada, M.D.    EKG:  Tele as above. SB with PVC's.   Cardiac Studies:  ECHO: EF 50% no embolic source.    Assessment/Plan:  Principal Problem:  *CVA (cerebral infarction) Active Problems:  Aphasia S/P CVA  Hypokalemia  Atrial fibrillation  AFIB - obviously, Dr. Katrinka Blazing has not been able to increase sotalol from 60 bid due to underlying bradycardia. Will resume home dose of 60 bid. Only had about 2 hours of afib yesterday. At some point, may need pacer. Heparin IV per Dr. Earl Gala until INR 2.   Chronic anticoag - I started coumadin yesterday. High CHADS.  CVA - hopeful improvement over time. Dr. Earl Gala following. On Statin. Coumadin.  SKAINS, MARK 07/18/2011, 9:59 AM

## 2011-07-18 NOTE — Progress Notes (Signed)
ANTICOAGULATION CONSULT NOTE - Follow Up Consult  Pharmacy Consult for heparin / Coumadin Indication: atrial fibrillation and acute CVA  No Known Allergies  Patient Measurements: Height: 5\' 10"  (177.8 cm) Weight: 185 lb (83.915 kg) IBW/kg (Calculated) : 73   Vital Signs: Temp: 97.5 F (36.4 C) (12/16 1400) Temp src: Oral (12/16 1400) BP: 141/67 mmHg (12/16 1400) Pulse Rate: 74  (12/16 1504)  Labs:  Basename 07/18/11 1445 07/18/11 0619 07/17/11 2132 07/17/11 0700 07/16/11 1145 07/16/11 0335 07/15/11 2107 07/15/11 1935  HGB -- 12.8* -- -- -- -- 13.9 --  HCT -- 37.9* -- -- -- -- 40.6 --  PLT -- 157 -- -- -- -- 164 --  APTT -- -- -- -- -- -- -- --  LABPROT -- 15.7* -- -- -- -- -- --  INR -- 1.22 -- -- -- -- -- --  HEPARINUNFRC 0.58 0.57 0.14* -- -- -- -- --  CREATININE -- 0.99 -- 0.92 -- 0.99 -- --  CKTOTAL -- -- -- -- 151 88 -- 52  CKMB -- -- -- -- 3.6 2.9 -- 2.3  TROPONINI -- -- -- -- <0.30 <0.30 -- <0.30   Estimated Creatinine Clearance: 65.5 ml/min (by C-G formula based on Cr of 0.99).   Medications:  Infusions:     . sodium chloride 10 mL/hr (07/17/11 1546)  . heparin 1,350 Units/hr (07/18/11 1610)    Assessment: 78 yom on IV heparin and coumadin for afib. Heparin level is slightly above goal due to lower goal since he has an acute CVA. No bleeding or other problems noted.   Goal of Therapy:  Heparin level 0.3-0.5   Plan:  Decrease heparin to 1250 units/hr Coumadin 7.5 mg x 1 Follow up AM labs  Thank you.  Elwin Sleight 07/18/2011,3:37 PM

## 2011-07-19 LAB — CBC
HCT: 37.6 % — ABNORMAL LOW (ref 39.0–52.0)
Hemoglobin: 12.7 g/dL — ABNORMAL LOW (ref 13.0–17.0)
MCH: 30.2 pg (ref 26.0–34.0)
MCHC: 33.8 g/dL (ref 30.0–36.0)
MCV: 89.5 fL (ref 78.0–100.0)
Platelets: 161 10*3/uL (ref 150–400)
RBC: 4.2 MIL/uL — ABNORMAL LOW (ref 4.22–5.81)
RDW: 13.9 % (ref 11.5–15.5)
WBC: 7.7 10*3/uL (ref 4.0–10.5)

## 2011-07-19 LAB — PROTIME-INR
INR: 1.94 — ABNORMAL HIGH (ref 0.00–1.49)
Prothrombin Time: 22.5 seconds — ABNORMAL HIGH (ref 11.6–15.2)

## 2011-07-19 LAB — HEPARIN LEVEL (UNFRACTIONATED): Heparin Unfractionated: 0.49 IU/mL (ref 0.30–0.70)

## 2011-07-19 MED ORDER — WARFARIN SODIUM 1 MG PO TABS
1.0000 mg | ORAL_TABLET | Freq: Once | ORAL | Status: AC
  Administered 2011-07-19: 1 mg via ORAL
  Filled 2011-07-19: qty 1

## 2011-07-19 NOTE — Progress Notes (Addendum)
Patient Name: Logan Day Date of Encounter: 07/19/2011    SUBJECTIVE: Chart has been reviewed. The patient has tachybradycardia syndrome with remote occurrences of atrial fibrillation. Rhythm control has been our preferred management strategy. Suboptimal doses of Betapace have been used because of excessive bradycardia. Discussions concerning Coumadin therapy have occurred and been rejected due to concern about changes in lifestyle.  TELEMETRY:  Sinus bradycardia: Filed Vitals:   07/18/11 1504 07/18/11 2200 07/19/11 0200 07/19/11 0600  BP:  128/79 101/63 167/81  Pulse: 74 51 61 61  Temp:  98.6 F (37 C) 97.7 F (36.5 C) 98 F (36.7 C)  TempSrc:      Resp:  18 18 18   Height:      Weight:      SpO2:  93% 90% 92%    Intake/Output Summary (Last 24 hours) at 07/19/11 0912 Last data filed at 07/19/11 0500  Gross per 24 hour  Intake 1187.5 ml  Output   1101 ml  Net   86.5 ml    LABS: Basic Metabolic Panel:  Basename 07/18/11 0619 07/17/11 0700  NA 137 140  K 4.1 3.1*  CL 106 108  CO2 23 23  GLUCOSE 107* 113*  BUN 16 16  CREATININE 0.99 0.92  CALCIUM 9.2 8.8  MG -- --  PHOS -- --   CBC:  Basename 07/19/11 0630 07/18/11 2158 07/18/11 0619  WBC 7.7 -- 6.8  NEUTROABS -- -- --  HGB 12.7* 13.5 --  HCT 37.6* 39.8 --  MCV 89.5 -- 89.8  PLT 161 -- 157   Cardiac Enzymes:  Basename 07/16/11 1145  CKTOTAL 151  CKMB 3.6  CKMBINDEX --  TROPONINI <0.30   Radiology/Studies:  Reviewed   Physical Exam: Blood pressure 167/81, pulse 61, temperature 98 F (36.7 C), temperature source Oral, resp. rate 18, height 5\' 10"  (1.778 m), weight 83.915 kg (185 lb), SpO2 92.00%. Weight change:    S4 gallop. 2 of 6 systolic murmur. No diastolic murmur.  ASSESSMENT:  1. Tachybradycardia syndrome with prior documentation of atrial fibrillation, remotely (> 10 years ago).  2. 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 Coumadin  therapy.  2. No specific change in therapy for atrial fibrillation.  Logan Day W 07/19/2011, 9:12 AM

## 2011-07-19 NOTE — Progress Notes (Signed)
Patient had urinalysis taken earlier for increased urinary frequency. Patient's urine was bright red colored urine then as stated in the urinalysis result. And, patient's on Heparin Drip. MD was aware about the urine color as stated by Elnita Maxwell, RN. At 2030, patient still has bright red colored urine. Patient is still on Heparin Drip. Called the MD on-call(Raliyah Montella Lynch) and orders made. Also, called Pharmacy and informed them about the bright red colored urine. Will continue to monitor patient closely.

## 2011-07-19 NOTE — Progress Notes (Signed)
Subjective: Denies pain and discomfort but I am not sure that he is communicating in a meaningful way  Objective:  Intake/Output Summary (Last 24 hours) at 07/19/11 0743 Last data filed at 07/19/11 0500  Gross per 24 hour  Intake 1427.5 ml  Output   1101 ml  Net  326.5 ml   BP 167/81  Pulse 61  Temp(Src) 98 F (36.7 C) (Oral)  Resp 18  Ht 5\' 10"  (1.778 m)  Wt 83.915 kg (185 lb)  BMI 26.54 kg/m2  SpO2 92% Neurologic: Answering questions with normal, fluent words, but I don't think he understands my questions.   Lab Results:  Basename 07/18/11 0619 07/17/11 0700  NA 137 140  K 4.1 3.1*  CL 106 108  CO2 23 23  GLUCOSE 107* 113*  BUN 16 16  CREATININE 0.99 0.92  CALCIUM 9.2 8.8  MG -- --  PHOS -- --   No results found for this basename: AST:2,ALT:2,ALKPHOS:2,BILITOT:2,PROT:2,ALBUMIN:2 in the last 72 hours No results found for this basename: LIPASE:2,AMYLASE:2 in the last 72 hours  Basename 07/19/11 0630 07/18/11 2158 07/18/11 0619  WBC 7.7 -- 6.8  NEUTROABS -- -- --  HGB 12.7* 13.5 --  HCT 37.6* 39.8 --  MCV 89.5 -- 89.8  PLT 161 -- 157    Basename 07/16/11 1145  CKTOTAL 151  CKMB 3.6  CKMBINDEX --  TROPONINI <0.30   No components found with this basename: POCBNP:3 No results found for this basename: DDIMER:2 in the last 72 hours No results found for this basename: HGBA1C:2 in the last 72 hours No results found for this basename: CHOL:2,HDL:2,LDLCALC:2,TRIG:2,CHOLHDL:2,LDLDIRECT:2 in the last 72 hours No results found for this basename: TSH,T4TOTAL,FREET3,T3FREE,THYROIDAB in the last 72 hours No results found for this basename: VITAMINB12:2,FOLATE:2,FERRITIN:2,TIBC:2,IRON:2,RETICCTPCT:2 in the last 72 hours  Studies/Results: No results found. Medications: Scheduled Meds:   . aspirin  300 mg Rectal Daily   Or  . aspirin  325 mg Oral Daily  . ciprofloxacin  250 mg Oral BID  . LORazepam  0.5 mg Intravenous Once  . pantoprazole  40 mg Oral Q1200  .  potassium chloride  40 mEq Oral BID  . sertraline  100 mg Oral Daily  . simvastatin  20 mg Oral q1800  . sotalol  60 mg Oral Q12H  . warfarin  7.5 mg Oral ONCE-1800  . warfarin   Does not apply Once  . DISCONTD: ciprofloxacin  250 mg Oral BID  . DISCONTD: sotalol  80 mg Oral BID   Continuous Infusions:   . sodium chloride 10 mL/hr (07/17/11 1546)  . heparin 1,250 Units/hr (07/18/11 2214)   PRN Meds:.acetaminophen, acetaminophen, ondansetron (ZOFRAN) IV, senna-docusate, sodium chloride  Assessment/Plan: Principal Problem:  *CVA (cerebral infarction) - no real change over the night except he seems to be calmer. I am eager to see Speech Therapy eval with recommendations and if they think he holds any hope for improvement from this point on. It has now been 4-5 days since the stroke. Active Problems:  Aphasia S/P CVA - see comments above.   Hypokalemia - repleted  Atrial fibrillation - rate controlled.    LOS: 4 days   Logan Day,Logan Day 07/19/2011, 7:43 AM

## 2011-07-19 NOTE — Progress Notes (Signed)
Physical Therapy Treatment Patient Details Name: Logan Day MRN: [id number] DOB: November 14, 1932 Today's Date: 07/19/2011  PT Assessment/Plan  PT - Assessment/Plan Comments on Treatment Session: Patient presents with improving mobility. Aphasia is patients main problem. PT Plan: Discharge plan remains appropriate Follow Up Recommendations: Skilled nursing facility Equipment Recommended: None recommended by PT PT Goals  Acute Rehab PT Goals PT Goal: Sit to Stand - Progress: Progressing toward goal PT Goal: Stand to Sit - Progress: Progressing toward goal PT Goal: Ambulate - Progress: Progressing toward goal Additional Goals PT Goal: Additional Goal #1 - Progress: Progressing toward goal  PT Treatment Precautions/Restrictions  Precautions Precautions: Fall Restrictions Weight Bearing Restrictions: No Mobility (including Balance) Transfers Sit to Stand: 4: Min assist;From chair/3-in-1;With upper extremity assist Sit to Stand Details (indicate cue type and reason): Min-guard assistance for safety Stand to Sit: 5: Supervision;With upper extremity assist;To chair/3-in-1 Stand to Sit Details: Good control of descent Ambulation/Gait Ambulation/Gait Assistance: 4: Min assist Ambulation/Gait Assistance Details (indicate cue type and reason): Min-guard assistance for safety. Patient able to state "oh that's pretty" for xmas tree, "I like that" when looking at picture, "They are fixing" when walking past workmen. However, patient unable to identify any objects in hallway. Patient notified PT when tired, and also stated "Thanks so much I really enjoyed that' when we had finished ambulation.  Patient ambulated at safe pace and 180 degree turns with 4 steps (not indicative off falls) Ambulation Distance (Feet): 400 Feet Assistive device: None Gait Pattern: Within Functional Limits Gait velocity: Decreased speed when scanning environment  High Level Balance High Level Balance Activites:  Turns;Sudden stops;Head turns High Level Balance Comments: No evidence of imbalance today End of Session PT - End of Session Equipment Utilized During Treatment: Gait belt Activity Tolerance: Patient tolerated treatment well Patient left: in chair Nurse Communication: Mobility status for ambulation General Cognitive Impairment: "I'm sorry I don't understand what you are saying" , "I can't answer that' ? increasing awareness.  Edwyna Perfect, PT  Pager [phone]  07/19/2011, 9:21 AM

## 2011-07-19 NOTE — Progress Notes (Signed)
ANTICOAGULATION CONSULT NOTE - Follow Up Consult  Pharmacy Consult for heparin / Coumadin Indication: atrial fibrillation and acute CVA  No Known Allergies  Patient Measurements: Height: 5\' 10"  (177.8 cm) Weight: 185 lb (83.915 kg) IBW/kg (Calculated) : 73   Vital Signs: Temp: 98 F (36.7 C) (12/17 0600) BP: 167/81 mmHg (12/17 0600) Pulse Rate: 61  (12/17 0600)  Labs:  Basename 07/19/11 0630 07/18/11 2158 07/18/11 1445 07/18/11 0619 07/17/11 0700 07/16/11 1145  HGB 12.7* 13.5 -- -- -- --  HCT 37.6* 39.8 -- 37.9* -- --  PLT 161 -- -- 157 -- --  APTT -- -- -- -- -- --  LABPROT 22.5* -- -- 15.7* -- --  INR 1.94* -- -- 1.22 -- --  HEPARINUNFRC 0.49 -- 0.58 0.57 -- --  CREATININE -- -- -- 0.99 0.92 --  CKTOTAL -- -- -- -- -- 151  CKMB -- -- -- -- -- 3.6  TROPONINI -- -- -- -- -- <0.30   Estimated Creatinine Clearance: 65.5 ml/min (by C-G formula based on Cr of 0.99).   Medications:  Infusions:     . sodium chloride 10 mL/hr (07/17/11 1546)  . heparin 1,250 Units/hr (07/18/11 2214)    Assessment: 78 yom on IV heparin and coumadin for afib/acute CVA. Heparin level is therapeutic. INR slightly subtherapeutic but large increase over past 24 hours. No bleeding or other problems noted.   Goal of Therapy:  Heparin level 0.3-0.5, INR 2-3   Plan:  Continue heparin at 1250 units/hr Coumadin 1mg  po today F/u a.m. INR and HL If a.m. INR >/= 2, d/c heparin  Thank you.  Lavonia Dana 07/19/2011,8:30 AM

## 2011-07-19 NOTE — Progress Notes (Signed)
Occupational Therapy Evaluation Patient Details Name: Logan Day MRN: [id number] DOB: [birth year] Today's Date: 07/19/2011 9:23-9:52  Problem List:  Patient Active Problem List  Diagnoses  . CVA (cerebral infarction)  . Aphasia S/P CVA  . Hypokalemia  . Atrial fibrillation    Past Medical History:  Past Medical History  Diagnosis Date  . Hypertension   . Atrial fibrillation   . Hypercholesteremia   . MI (myocardial infarction)     x 2  . Angina   . Cancer     prostate cancer  . Coronary artery disease     Circ stent (2009 cath patent)  . Prostate cancer   . Hyperlipidemia   . MI (myocardial infarction)   . TIA (transient ischemic attack)    Past Surgical History:  Past Surgical History  Procedure Date  . Prostayectomy   . Fracture surgery     OT Assessment/Plan/Recommendation OT Assessment Clinical Impression Statement: This 77 yo male s/p CVA presents to acute OT with the following problems, thus affecting pt's PLOF at I with BADLs and IADLs. Will benefit from acute OT with follow-up at SNF to  get to a S level. OT Recommendation/Assessment: Patient will need skilled OT in the acute care venue OT Problem List: Impaired balance (sitting and/or standing);Decreased cognition;Decreased safety awareness;Decreased knowledge of precautions Barriers to Discharge: Decreased caregiver support OT Therapy Diagnosis : Cognitive deficits OT Plan OT Frequency: Min 2X/week OT Treatment/Interventions: Self-care/ADL training;Balance training;Patient/family education;Cognitive remediation/compensation OT Recommendation Follow Up Recommendations: Skilled nursing facility Equipment Recommended: Defer to next venue Individuals Consulted Consulted and Agree with Results and Recommendations: Patient unable/family or caregiver not available OT Goals Acute Rehab OT Goals OT Goal Formulation: With patient Time For Goal Achievement: 7 days ADL Goals Pt Will Perform Grooming:  with set-up;with supervision;Standing at sink;Unsupported;with cueing (comment type and amount);Supported (min verbal cues for sequencing) ADL Goal: Grooming - Progress: Progressing toward goals Pt Will Perform Upper Body Bathing: with set-up;with supervision;Sit to stand from chair;Sit to stand from bed;Sit to stand in shower;Unsupported;with cueing (comment type and amount) (min verbal cues for sequencing) ADL Goal: Upper Body Bathing - Progress: Not addressed Pt Will Perform Lower Body Bathing: with set-up;with supervision;Sit to stand from chair;Sit to stand from bed;Sit to stand in shower;with cueing (comment type and amount) (min verbal cues for sequencing) ADL Goal: Lower Body Bathing - Progress: Progressing toward goals Pt Will Perform Upper Body Dressing: with set-up;with supervision;Sit to stand from bed;Sit to stand from chair;with cueing (comment type and amount) (min verbal cues for seqencing) ADL Goal: Upper Body Dressing - Progress: Progressing toward goals Pt Will Perform Lower Body Dressing: with set-up;with supervision;Unsupported;Sit to stand from chair;Sit to stand from bed;with cueing (comment type and amount) (min verbal cues sequencing) ADL Goal: Lower Body Dressing - Progress: Progressing toward goals  OT Evaluation Precautions/Restrictions  Precautions Precautions: Fall Required Braces or Orthoses: No Restrictions Weight Bearing Restrictions: No Prior Functioning Home Living Lives With: Alone Receives Help From: Family (24 hour A not available per son [name])) Type of Home: House Home Layout: One level Home Access: Stairs to enter Entrance Stairs-Rails: Doctor, general practice of Steps: 3 Bathroom Shower/Tub: Electrical engineer Equipment: None Prior Function Level of Independence: Independent with basic ADLs;Independent with homemaking with ambulation;Independent with gait;Independent with transfers Driving:  Yes Vocation: Retired ADL ADL Eating/Feeding: Simulated;Supervision/safety;Set up Eating/Feeding Details (indicate cue type and reason): Pt not fully sequencing tasks, so needs S for cues to continue Where Assessed -  Eating/Feeding: Chair Grooming: Performed;Wash/dry hands;Supervision/safety;Set up Grooming Details (indicate cue type and reason): To wash hands pt needed mod verbal and gestural cues to complete task throughly after going to the bathroom. Where Assessed - Grooming: Standing at sink Upper Body Bathing: Simulated;Supervision/safety;Set up Upper Body Bathing Details (indicate cue type and reason): Needs cues to sequence task Where Assessed - Upper Body Bathing: Supported;Sitting, chair Lower Body Bathing: Simulated;Supervision/safety;Set up Lower Body Bathing Details (indicate cue type and reason): Needs cues to sequence task Where Assessed - Lower Body Bathing: Supported;Sit to stand from chair Upper Body Dressing: Simulated;Supervision/safety;Set up Upper Body Dressing Details (indicate cue type and reason): Needs cues to sequence task Where Assessed - Upper Body Dressing: Supported;Sitting, chair Lower Body Dressing: Performed;Supervision/safety;Set up Lower Body Dressing Details (indicate cue type and reason): Needed verbal and gestural cues to to doff and don socks Where Assessed - Lower Body Dressing: Sit to stand from chair Toilet Transfer: Other (comment);Performed (min guard A standing at toilet to urinate) Toilet Transfer Details (indicate cue type and reason): Pt with blood in his urine, RN Boneta Lucks) aware-this has been an ongoing event Statistician Method: Proofreader: Regular height toilet Toileting - Clothing Manipulation: Performed;Independent;Other (comment) (2 gowns) Where Assessed - Toileting Clothing Manipulation: Standing Toileting - Hygiene: Performed;Independent Where Assessed - Toileting Hygiene: Standing Tub/Shower Transfer:  Not assessed Tub/Shower Transfer Method: Not assessed Equipment Used: Other (comment) (none) Vision/Perception  Vision - History Baseline Vision: Other (comment) (pt unable to state if he wears glasses or not) Vision - Assessment Eye Alignment: Within Functional Limits Vision Assessment: Vision tested Ocular Range of Motion: Other (comment) (Appears WFL) Tracking/Visual Pursuits: Other (comment) (Able to find object in all quadrants) Visual Fields: No apparent deficits Perception Perception: Not tested Praxis Praxis: Not tested Cognition Cognition Arousal/Alertness: Awake/alert Overall Cognitive Status: Impaired Attention: Impaired Current Attention Level: Sustained Following Commands: Follows one step commands inconsistently;Other (comment) (better if gestural cues added) Safety/Judgement: Decreased awareness of safety precautions;Decreased safety judgement for tasks assessed Decreased Safety/Judgement: Decreased awareness of need for assistance Awareness of Deficits: Decreased awareness of deficits Problem Solving: Requires assistance for problem solving Sensation/Coordination   Extremity Assessment RUE Assessment RUE Assessment: Within Functional Limits LUE Assessment LUE Assessment: Within Functional Limits Mobility  Bed Mobility Bed Mobility: No Transfers Transfers: Yes Sit to Stand: Other (comment);With upper extremity assist;From chair/3-in-1 (min guard A) Sit to Stand Details (indicate cue type and reason): Min-guard assistance for safety Stand to Sit: Other (comment);With upper extremity assist;To chair/3-in-1 (min guard A) Stand to Sit Details: Good control of descent Exercises   End of Session OT - End of Session Equipment Utilized During Treatment:  (None) Activity Tolerance: Patient tolerated treatment well Patient left: in chair;with call bell in reach General Behavior During Session: Other (comment) (Frustrated with decreased ability to  communicate) Cognition: Impaired Cognitive Impairment: kept saying "I just don't know"   Evette Georges [phone] 07/19/2011, 12:16 PM

## 2011-07-20 LAB — URINE CULTURE: Culture  Setup Time: 201212161923

## 2011-07-20 LAB — CBC
HCT: 37.6 % — ABNORMAL LOW (ref 39.0–52.0)
Hemoglobin: 12.7 g/dL — ABNORMAL LOW (ref 13.0–17.0)
MCH: 30.2 pg (ref 26.0–34.0)
MCHC: 33.8 g/dL (ref 30.0–36.0)
MCV: 89.3 fL (ref 78.0–100.0)
Platelets: 165 10*3/uL (ref 150–400)
RBC: 4.21 MIL/uL — ABNORMAL LOW (ref 4.22–5.81)
RDW: 13.9 % (ref 11.5–15.5)
WBC: 6.6 10*3/uL (ref 4.0–10.5)

## 2011-07-20 LAB — PROTIME-INR
INR: 2.32 — ABNORMAL HIGH (ref 0.00–1.49)
Prothrombin Time: 25.9 seconds — ABNORMAL HIGH (ref 11.6–15.2)

## 2011-07-20 LAB — HEPARIN LEVEL (UNFRACTIONATED): Heparin Unfractionated: 0.48 IU/mL (ref 0.30–0.70)

## 2011-07-20 NOTE — Progress Notes (Signed)
Speech Language/Pathology Speech Pathology:  Treatment Note  Subjective: "I don't know what you're saying"   Objective:  Automatic speech tasks with perseveration with numbers " 1,2,3,4,5".  Fair-appropriate tune with happy birthday and Beulah Gandy with no word approximations (counting with the tune).  Able to imitate 1/10 words.  Pointed to simple pictures from field of 2 with 1/3 accuracy without cues, unable to follow one step commands given written, verbal and visual cues (component of motor apraxia), unable to write/copy his name, unable to name common objects with all hierarchy of cueing.  Intermittently able to  Express automatic or random utterances ("I don't know, just messed up").     Assessment: global apraxia, poor receptive language abilities  Recommendations: continue treatment, pt. would   Pain:   none Intervention Required:   No   Goals: No Goals Met  Royce Macadamia M.Ed [employer] [phone]  07/20/2011

## 2011-07-20 NOTE — Progress Notes (Signed)
Subjective: No new complaints.   When asked if he had pain, he answered no. Asked him several other questions and he appears to have some understanding but not with others.   Objective:  BP 120/80  Pulse 70  Temp(Src) 98.8 F (37.1 C) (Oral)  Resp 20  Ht 5\' 10"  (1.778 m)  Wt 83.915 kg (185 lb)  BMI 26.54 kg/m2  SpO2 96% Neurologic: Follows some simple commands. Does not know what happened to him and cannot repeat it after he is told  Lab Results:  [facility] 07/18/11 0619  NA 137  K 4.1  CL 106  CO2 23  GLUCOSE 107*  BUN 16  CREATININE 0.99  CALCIUM 9.2  MG --  PHOS --    Basename 07/20/11 0500 07/19/11 0630  WBC 6.6 7.7  NEUTROABS -- --  HGB 12.7* 12.7*  HCT 37.6* 37.6*  MCV 89.3 89.5  PLT 165 161   INR 2.3  Studies/Results: No results found. Medications: Scheduled Meds:   . aspirin  300 mg Rectal Daily   Or  . aspirin  325 mg Oral Daily  . ciprofloxacin  250 mg Oral BID  . LORazepam  0.5 mg Intravenous Once  . pantoprazole  40 mg Oral Q1200  . sertraline  100 mg Oral Daily  . simvastatin  20 mg Oral q1800  . sotalol  60 mg Oral Q12H  . warfarin  1 mg Oral ONCE-1800  . warfarin   Does not apply Once   Continuous Infusions:   . sodium chloride 10 mL/hr (07/17/11 1546)  . heparin 1,250 Units/hr (07/19/11 1916)   PRN Meds:.acetaminophen, acetaminophen, ondansetron (ZOFRAN) IV, senna-docusate, sodium chloride  Assessment/Plan: Principal Problem:  *CVA (cerebral infarction) - I think he may be slightly better. SLP has not seen patient yet. Talked with his son. He will need SNF for now. Family cannot care for him at home.  Active Problems:  Aphasia S/P CVA  Hypokalemia  Atrial fibrillation - controlled. Pharmacy managing transition to Coumadin   LOS: 5 days   Genae Strine CHARLES 07/20/2011, 7:31 AM

## 2011-07-20 NOTE — Progress Notes (Signed)
SUBJECTIVE:  S/P CVA felt cardioembolic from silent PAF.  Now loading coumadin.  He denies any pain but limited communication when asking him questions.  OBJECTIVE:   Vitals:   Filed Vitals:   07/19/11 2200 07/20/11 0200 07/20/11 0647 07/20/11 1040  BP: 111/74 112/76 120/80 132/61  Pulse: 58  70 50  Temp: 98.6 F (37 C) 98.9 F (37.2 C) 98.8 F (37.1 C) 97.4 F (36.3 C)  TempSrc: Oral Oral Oral Axillary  Resp: 18 18 20 18   Height:      Weight:      SpO2: 96% 94% 96% 94%   I&O's:  No intake or output data in the 24 hours ending 07/20/11 1138 TELEMETRY: Reviewed telemetry pt in sinus bradycardia     PHYSICAL EXAM  Lungs:   Clear bilaterally to auscultation anteriorly Heart:   HRRR S1 S2 Pulses are 2+ & equal.            No carotid bruit.. Abdomen: Bowel sounds are positive, abdomen soft and non-tender Extremities:   No clubbing, cyanosis or edema.  DP +1   LABS: Basic Metabolic Panel:  Basename 07/18/11 0619  NA 137  K 4.1  CL 106  CO2 23  GLUCOSE 107*  BUN 16  CREATININE 0.99  CALCIUM 9.2  MG --  PHOS --    CBC:  Basename 07/20/11 0500 07/19/11 0630  WBC 6.6 7.7  NEUTROABS -- --  HGB 12.7* 12.7*  HCT 37.6* 37.6*  MCV 89.3 89.5  PLT 165 161   Coag Panel:   Lab Results  Component Value Date   INR 2.32* 07/20/2011   INR 1.94* 07/19/2011   INR 1.22 07/18/2011    RADIOLOGY: Ct Head Wo Contrast  07/15/2011  *RADIOLOGY REPORT*  Clinical Data: Aphasia, history of prostate cancer  CT HEAD WITHOUT CONTRAST  Technique:  Contiguous axial images were obtained from the base of the skull through the vertex without contrast.  Comparison: None  Findings: Generalized atrophy. Normal ventricular morphology. No midline shift or mass effect. Minimal small vessel chronic ischemic changes of deep cerebral white matter. Large old posterior right temporoparietal infarct. Large area of abnormal decreased attenuation and loss of gray-white differentiation at the left  temporoparietal lobe consistent with acute to subacute infarct. No intracranial hemorrhage or definite mass lesion identified. No definite mass effect is seen at the area of the parenchymal attenuation abnormality at the left temporoparietal lobe.  Left basal ganglia lacunar infarct. No additional areas of infarction identified. Posterior fossa normal appearance. Atherosclerotic calcifications of internal carotid arteries at skull base. Bones demineralized. No significant sinus opacification.  IMPRESSION: Atrophy with minimal small vessel chronic ischemic changes of deep cerebral white matter. Old right temporoparietal infarct. Acute to subacute left temporoparietal infarct. Lacunar infarct left basal ganglia.  Original Report Authenticated By: Lollie Marrow, M.D.   Mr Brain Wo Contrast  07/16/2011  *RADIOLOGY REPORT*  Clinical Data: Slurred speech with altered mental status.  MRI HEAD WITHOUT CONTRAST  Technique:  Multiplanar, multiecho pulse sequences of the brain and surrounding structures were obtained according to standard protocol without intravenous contrast.  Comparison: 07/15/2011 CT.  No comparison MR.  Findings: The patient was only able to complete the four sequences and requested the exam be terminated.  Moderate sized acute infarct left mid to posterior temporal lobe, posterior left sub insular/periopercular region, left parietal lobe and posterior left temporal lobe.  No obvious hemorrhage although gradient sequence was not performed.  Local mass effect. Thrombosed left middle  cerebral artery branch vessels suspected. Vertebral arteries, basilar artery and internal carotid arteries (at the level of the cavernous sinus) are patent.  Prior right parietal - temporal lobe infarct with encephalomalacia.  Prominent perivascular space left basal ganglia.  Small vessel disease type changes.  Global atrophy without hydrocephalus.  No intracranial mass lesion detected on this unenhanced motion degraded  exam.  Paranasal sinus mucosal thickening.  Mild spinal stenosis C3-4.  Degenerative changes C1-2.  IMPRESSION: Motion degraded exam.  The patient was only able to complete the four sequences and requested the exam be terminated.  Moderate sized acute infarct left mid to posterior temporal lobe, posterior left sub insular/periopercular region, left parietal lobe and posterior left temporal lobe.  Original Report Authenticated By: Fuller Canada, M.D.      ASSESSMENT:  1.  Tachybrady syndrome with history of PAF remotely 2.  Probable embolic CVA possibly due to silent PAF.  PLAN:   1.  Coumadin therapy  Quintella Reichert, MD  07/20/2011  11:38 AM

## 2011-07-20 NOTE — Progress Notes (Signed)
PT Cancellation Note  Treatment cancelled today due to patient resting comfortably and indicates he does not want to get up now. Only rouses briefly for conversation - per nurse did not sleep well last night.   07/20/2011 Edwyna Perfect, PT  Pager [phone]

## 2011-07-21 LAB — PROTIME-INR
INR: 2.06 — ABNORMAL HIGH (ref 0.00–1.49)
Prothrombin Time: 23.6 seconds — ABNORMAL HIGH (ref 11.6–15.2)

## 2011-07-21 MED ORDER — WARFARIN SODIUM 5 MG PO TABS
5.0000 mg | ORAL_TABLET | Freq: Once | ORAL | Status: AC
  Administered 2011-07-21: 5 mg via ORAL
  Filled 2011-07-21: qty 1

## 2011-07-21 NOTE — Progress Notes (Signed)
Occupational Therapy Treatment Patient Details Name: CATARINO VOLD MRN: [id number] DOB: 20-Apr-1933 Today's Date: 07/21/2011 9:30-10:10  OT Assessment/Plan OT Assessment/Plan Comments on Treatment Session: Pt making slow progress due to expressive and receptive difficulties. OT Plan: Discharge plan remains appropriate OT Frequency: Min 2X/week Follow Up Recommendations: Skilled nursing facility Equipment Recommended: Defer to next venue OT Goals ADL Goals ADL Goal: Grooming - Progress: Progressing toward goals ADL Goal: Upper Body Bathing - Progress: Progressing toward goals ADL Goal: Lower Body Bathing - Progress: Progressing toward goals ADL Goal: Upper Body Dressing - Progress: Progressing toward goals ADL Goal: Lower Body Dressing - Progress: Progressing toward goals  OT Treatment Precautions/Restrictions  Precautions Precautions: Fall Required Braces or Orthoses: No Restrictions Weight Bearing Restrictions: No   ADL ADL Grooming: Performed;Shaving;Wash/dry hands;Wash/dry face;Teeth care;Brushing hair;Supervision/safety;Set up;Minimal assistance Grooming Details (indicate cue type and reason): Max cues to initiate every activity then set-up/S with occassional cues (verbal and gestural) to complete task, min A to be thorough with electric razor. Where Assessed - Grooming: Sitting, chair Upper Body Bathing: Performed;Supervision/safety;Set up Upper Body Bathing Details (indicate cue type and reason): Max cues to initiate every activity then set-up/S with occassional cues (verbal and gestural) to complete task Where Assessed - Upper Body Bathing: Unsupported;Sitting, chair Lower Body Bathing: Performed;Supervision/safety;Set up Lower Body Bathing Details (indicate cue type and reason): Max cues to initiate every activity then set-up/S with occassional cues (verbal and gestural) to complete task Where Assessed - Lower Body Bathing: Unsupported;Sit to stand from chair Upper  Body Dressing: Performed;Minimal assistance Upper Body Dressing Details (indicate cue type and reason): Max cues to initiate every activity then min A with occassional cues (verbal and gestural) to complete task---gown. Where Assessed - Upper Body Dressing: Unsupported;Sitting, chair Lower Body Dressing: Performed;Supervision/safety;Set up Lower Body Dressing Details (indicate cue type and reason): Max cues to initiate every activity then set-up/S with occassional cues (verbal and gestural) to complete task Where Assessed - Lower Body Dressing: Sit to stand from chair Toilet Transfer: Not assessed Toilet Transfer Method: Not assessed Toileting - Clothing Manipulation: Not assessed Toileting - Hygiene: Not assessed Tub/Shower Transfer: Not assessed Tub/Shower Transfer Method: Not assessed Mobility  Bed Mobility Bed Mobility: No Supine to Sit: 5: Supervision Supine to Sit Details (indicate cue type and reason): Patient sitting [place] style on bed upon entry. Able to move well in bed without assistance, but unable to follow commands to sit to edge of bed either verbal or tactile. Requires initiation by PT tounderstand task being asked. Sitting - Scoot to Edge of Bed: 7: Independent Transfers Transfers: Yes Sit to Stand: 5: Supervision;With upper extremity assist;From chair/3-in-1 Sit to Stand Details (indicate cue type and reason): Able to stand without upper extremity use. No safety issues.  Stand to Sit: 5: Supervision;With upper extremity assist;To chair/3-in-1 Stand to Sit Details: Controls sit to chair well. Unable to sit to command unless mild presure applied to shoulders. Exercises    End of Session OT - End of Session Equipment Utilized During Treatment: Other (comment) (none) Activity Tolerance: Patient tolerated treatment well (full ADL session) Patient left: in chair;with call bell in reach General Behavior During Session: Other (comment) (frustrated at times secondary to  inablity to communicate) Cognition: Impaired Cognitive Impairment: Decreased initiation and follow through with activities.  Evette Georges [phone] 07/21/2011, 10:31 AM

## 2011-07-21 NOTE — Progress Notes (Signed)
Subjective: The patient continues to have problems with word finding and communication. He intermittently follows simple commands. He denies pain and shortness of breath. He continues to express his concern that he does not know what is going on.  Objective:  BP 145/75  Pulse 48  Temp(Src) 98.3 F (36.8 C) (Oral)  Resp 19  Ht 5\' 10"  (1.778 m)  Wt 83.915 kg (185 lb)  BMI 26.54 kg/m2  SpO2 93% Neuro: Moves all extremities normally. Follows simple commands intermittently. Answers to yes and no questions are inconsistent.  Lab Results:  Basename 07/20/11 0500 07/19/11 0630  WBC 6.6 7.7  NEUTROABS -- --  HGB 12.7* 12.7*  HCT 37.6* 37.6*  MCV 89.3 89.5  PLT 165 161    Studies/Results: No results found. Medications: Scheduled Meds:   . aspirin  300 mg Rectal Daily   Or  . aspirin  325 mg Oral Daily  . ciprofloxacin  250 mg Oral BID  . LORazepam  0.5 mg Intravenous Once  . pantoprazole  40 mg Oral Q1200  . sertraline  100 mg Oral Daily  . simvastatin  20 mg Oral q1800  . sotalol  60 mg Oral Q12H  . warfarin   Does not apply Once   Continuous Infusions:   . sodium chloride 20 mL/hr at 07/21/11 0016  . DISCONTD: heparin 1,250 Units/hr (07/19/11 1916)   PRN Meds:.acetaminophen, acetaminophen, ondansetron (ZOFRAN) IV, senna-docusate, sodium chloride  Assessment/Plan: Principal Problem:  *CVA (cerebral infarction) - he has made very little progress in the last 24 hours. He has been seen speech therapy. Upon review of their note I cannot see any real goals of therapy at this point. I have consulted case management to start the skilled nursing facility placement process. Active Problems:  Aphasia S/P CVA  Hypokalemia - potassium has been repleted.  Atrial fibrillation - rate is controlled and patient is on anticoagulation.   LOS: 6 days   Logan Day 07/21/2011, 8:09 AM

## 2011-07-21 NOTE — Progress Notes (Signed)
ANTICOAGULATION CONSULT NOTE - Follow Up Consult  Pharmacy Consult for coumadin Indication: atrial fibrillation  No Known Allergies  Patient Measurements: Height: 5\' 10"  (177.8 cm) Weight: 185 lb (83.915 kg) IBW/kg (Calculated) : 73  Adjusted Body Weight:   Vital Signs: Temp: 98.3 F (36.8 C) (12/19 0500) Temp src: Oral (12/19 0500) BP: 145/75 mmHg (12/19 0500) Pulse Rate: 48  (12/19 0500)  Labs:  Basename 07/21/11 0542 07/20/11 0500 07/19/11 0630 07/18/11 2158 07/18/11 1445  HGB -- 12.7* 12.7* -- --  HCT -- 37.6* 37.6* 39.8 --  PLT -- 165 161 -- --  APTT -- -- -- -- --  LABPROT 23.6* 25.9* 22.5* -- --  INR 2.06* 2.32* 1.94* -- --  HEPARINUNFRC -- 0.48 0.49 -- 0.58  CREATININE -- -- -- -- --  CKTOTAL -- -- -- -- --  CKMB -- -- -- -- --  TROPONINI -- -- -- -- --   Estimated Creatinine Clearance: 65.5 ml/min (by C-G formula based on Cr of 0.99).   Medications:  Scheduled:    . aspirin  300 mg Rectal Daily   Or  . aspirin  325 mg Oral Daily  . ciprofloxacin  250 mg Oral BID  . LORazepam  0.5 mg Intravenous Once  . pantoprazole  40 mg Oral Q1200  . sertraline  100 mg Oral Daily  . simvastatin  20 mg Oral q1800  . sotalol  60 mg Oral Q12H  . warfarin   Does not apply Once    Assessment: 78 YOM on anticoagulation for afib s/p acute CVA.  Off heparin gtt. INR therapeutic, small decrease in INR.  Patient is on cipro - concern for interaction with coumadin.  Does not appear coumadin dose entered last night  Goal of Therapy:  INR 2-3   Plan:  1. Coumadin 5mg  po x1 2. Daily INR  Dannielle Huh 07/21/2011,9:30 AM

## 2011-07-21 NOTE — Plan of Care (Signed)
Please call patient's son [name] [phone].  He is very concerned about where his dad will be going post discharge and would like to be kept in the loop.

## 2011-07-21 NOTE — Progress Notes (Signed)
Physical Therapy Treatment Patient Details Name: Logan Day MRN: [id number] DOB: 1933-05-12 Today's Date: 07/21/2011  PT Assessment/Plan  PT - Assessment/Plan Comments on Treatment Session: Patient with improved safety with mobility. PT Frequency: Min 3X/week Follow Up Recommendations: Skilled nursing facility Equipment Recommended: Defer to next venue PT Goals  Acute Rehab PT Goals PT Goal: Supine/Side to Sit - Progress: Progressing toward goal PT Goal: Sit to Stand - Progress: Met PT Goal: Stand to Sit - Progress: Met PT Goal: Ambulate - Progress: Progressing toward goal Additional Goals PT Goal: Additional Goal #1 - Progress: Discontinued (comment) (Patient can not follow directions for completion)  PT Treatment Precautions/Restrictions  Precautions Precautions: Fall Required Braces or Orthoses: No Restrictions Weight Bearing Restrictions: No Mobility (including Balance) Bed Mobility Supine to Sit: 5: Supervision Supine to Sit Details (indicate cue type and reason): Patient sitting [place] style on bed upon entry. Able to move well in bed without assistance, but unable to follow commands to sit to edge of bed either verbal or tactile. Requires initiation by PT tounderstand task being asked. Sitting - Scoot to Edge of Bed: 7: Independent Transfers Sit to Stand: 5: Supervision;From bed Sit to Stand Details (indicate cue type and reason): Able to stand without upper extremity use. No safety issues.  Stand to Sit: 5: Supervision;To chair/3-in-1;With upper extremity assist Stand to Sit Details: Controls sit to chair well. Unable to sit to command unless mild presure applied to shoulders. Ambulation/Gait Ambulation/Gait Assistance: 4: Min assist Ambulation/Gait Assistance Details (indicate cue type and reason): le times. States "if you say so" when informed of items. Once reached room asked patient if he wanted to go in and he chose to. Ambulation Distance (Feet): 400  Feet Assistive device: None Gait Pattern: Within Functional Limits    End of Session PT - End of Session Equipment Utilized During Treatment: Gait belt Activity Tolerance: Patient tolerated treatment well Patient left: in chair General Behavior During Session:  (Demonstrates some frustration at times with inability to nam) Cognitive Impairment: Able to repeat back directions given, but unable to perform without initiation from PT.  Edwyna Perfect, PT  Pager [phone]  07/21/2011, 9:38 AM

## 2011-07-22 ENCOUNTER — Encounter (HOSPITAL_COMMUNITY): Payer: Self-pay

## 2011-07-22 LAB — PROTIME-INR
INR: 2.23 — ABNORMAL HIGH (ref 0.00–1.49)
Prothrombin Time: 25.1 seconds — ABNORMAL HIGH (ref 11.6–15.2)

## 2011-07-22 NOTE — Progress Notes (Signed)
LATE ENTRY: On 07/21/11 Clinical Social Worker completed the psychosocial assessment, which can be found in the shadow chart. FL-2 completed and placed in shadow chart for MD signature and skilled nursing facility search initiated.  CSW will follow-up with son with bed offers.  Genelle Bal, MSW, LCSW [phone]

## 2011-07-22 NOTE — Progress Notes (Signed)
Clinical Social Worker talked with patient's son and advised that [facility] did make a bed offer and contact was made with facility to accept bed. Son advised that he will be contacted when patient ready for d/c and son indicated that he will transport patient.  Genelle Bal, MSW, LCSW [phone]

## 2011-07-22 NOTE — Progress Notes (Signed)
ANTICOAGULATION CONSULT NOTE - Follow Up Consult  Pharmacy Consult for Coumadin Indication: Afib, CVA  Assessment: 77 yo male who recently started coumadin s/p CVA with concurrent afib.  Currently NSR, and INR remains therapeutic at 2.23.  Patient recently started ciprofloxacin on 12/16 which can cause INR elevations, so will monitor closely.  No bleeding issues reported.  Pharmacy System-Based Medication Review: Infectious Diseases: on Day#4 cipro (since 12/16) for UTI? Neurology: Profound deficits s/p CVA, aphasia Cardiovascular: Afib- currently NSR on sotalol Nephrology: CrCl~65, stable  Goal of Therapy:  INR 2-3   Plan:  Coumadin 5mg  PO x1 today. Check INR in AM.  Logan Day 07/22/2011,10:15 AM  No Known Allergies  Patient Measurements: Height: 5\' 10"  (177.8 cm) Weight: 185 lb (83.915 kg) IBW/kg (Calculated) : 73   Vital Signs: Temp: 97.9 F (36.6 C) (12/20 0931) Temp src: Oral (12/20 0931) BP: 107/61 mmHg (12/20 0931) Pulse Rate: 60  (12/20 0931)  Labs:  Basename 07/22/11 0636 07/21/11 0542 07/20/11 0500  HGB -- -- 12.7*  HCT -- -- 37.6*  PLT -- -- 165  APTT -- -- --  LABPROT 25.1* 23.6* 25.9*  INR 2.23* 2.06* 2.32*  HEPARINUNFRC -- -- 0.48  CREATININE -- -- --  CKTOTAL -- -- --  CKMB -- -- --  TROPONINI -- -- --   Estimated Creatinine Clearance: 65.5 ml/min (by C-G formula based on Cr of 0.99).   Medications:  Scheduled:    . ciprofloxacin  250 mg Oral BID  . LORazepam  0.5 mg Intravenous Once  . pantoprazole  40 mg Oral Q1200  . sertraline  100 mg Oral Daily  . simvastatin  20 mg Oral q1800  . sotalol  60 mg Oral Q12H  . warfarin  5 mg Oral ONCE-1800  . warfarin   Does not apply Once  . DISCONTD: aspirin  300 mg Rectal Daily  . DISCONTD: aspirin  325 mg Oral Daily   Infusions:

## 2011-07-22 NOTE — Progress Notes (Signed)
Speech Language/Pathology Speech Pathology:  Treatment Note   Objective:   Pt. seen for aphasia treatment.  Pt. needed total hand over hand assist to copy letters in his name.  Slight improvements seen as pt. was able to accurately read his name and state "Logan Day, that's me".  Also read "t.v" and the letter "e".  Participated in singing familiar song with max cues to initiate phonation with no word approximations and fair tune.  He continues to express intermittent automatic phases such as "I don't know, see you later etc)   Assessment:  Pt. Continues with severe aphasia (receptive and expressive).  Slight improvements observed with verbally identifying single consonant and single words, however, comprehension of what is read continues to be poor.  Pt.'s poor comprehension significantly impacts pt.'s abilities to communicate and interact with others.  Encourage the use of gestures to communicate when speaking to pt.   Recommendations: Continue speech therapy in acute care and as an outpatient.  Pt. Will need 24 hour supervision at discharge.   Pain:   none Intervention Required:   No   Goals: No Goals Met  Breck Coons Hope M.Ed [employer] [phone]  07/22/2011

## 2011-07-22 NOTE — Progress Notes (Signed)
Subjective: The patient still has significant word finding problems. He did seem to express consistently that he had no discomfort. When explained to him, he still cannot repeat what his underlying condition is. However, unlike previous days, he seemed to have some understanding that I was telling him about something wrong with his brain.  Objective:  Intake/Output Summary (Last 24 hours) at 07/22/11 0836 Last data filed at 07/21/11 1700  Gross per 24 hour  Intake    660 ml  Output      0 ml  Net    660 ml   BP 127/60  Pulse 54  Temp(Src) 98.3 F (36.8 C) (Oral)  Resp 18  Ht 5\' 10"  (1.778 m)  Wt 83.915 kg (185 lb)  BMI 26.54 kg/m2  SpO2 97% Neurologic: See comments under subjective  Lab Results:  Basename 07/20/11 0500  WBC 6.6  NEUTROABS --  HGB 12.7*  HCT 37.6*  MCV 89.3  PLT 165    Studies/Results: No results found. Medications: Scheduled Meds:   . ciprofloxacin  250 mg Oral BID  . LORazepam  0.5 mg Intravenous Once  . pantoprazole  40 mg Oral Q1200  . sertraline  100 mg Oral Daily  . simvastatin  20 mg Oral q1800  . sotalol  60 mg Oral Q12H  . warfarin  5 mg Oral ONCE-1800  . warfarin   Does not apply Once  . DISCONTD: aspirin  300 mg Rectal Daily  . DISCONTD: aspirin  325 mg Oral Daily   Continuous Infusions:  PRN Meds:.acetaminophen, acetaminophen, ondansetron (ZOFRAN) IV, senna-docusate, sodium chloride  Assessment/Plan: Principal Problem:  *CVA (cerebral infarction) - there seems to have been slight improvement in his ability to understand. Nevertheless, his deficits are profound. I have signed the FL2 that is in the shadow chart. Active Problems:  Aphasia S/P CVA  Hypokalemia - recheck K. in a.m.  Atrial fibrillation - he is in normal rhythm this morning. I note the recommendation of pharmacy to lower or discontinue aspirin, and have stopped it.    LOS: 7 days   Logan Day 07/22/2011, 8:36 AM

## 2011-07-22 NOTE — Progress Notes (Signed)
Physical Therapy Treatment Patient Details Name: IRVIN BASTIN MRN: [id number] DOB: Nov 02, 1932 Today's Date: 07/22/2011  PT Assessment/Plan  PT - Assessment/Plan Comments on Treatment Session: Improving balance and safety, but aphasia will limit patients ability to be independent PT Plan: Discharge plan remains appropriate Follow Up Recommendations: Skilled nursing facility PT Goals  Acute Rehab PT Goals PT Goal: Supine/Side to Sit - Progress: Met PT Goal: Sit to Stand - Progress: Met PT Goal: Stand to Sit - Progress: Met PT Goal: Ambulate - Progress: Progressing toward goal  PT Treatment Precautions/Restrictions  Precautions Precautions: Fall Required Braces or Orthoses: No Restrictions Weight Bearing Restrictions: No Mobility (including Balance) Bed Mobility Supine to Sit: 7: Independent Supine to Sit Details (indicate cue type and reason): Gestural cues for patient to understand task  Sitting - Scoot to [place] of Bed: 7: Independent Sitting - Scoot to Max of Bed Details (indicate cue type and reason): Mildly impulsive stands before he has scooted to edge of bed. Transfers Sit to Stand: 7: Independent;From bed Sit to Stand Details (indicate cue type and reason): Although stands pre-scooting to edge of bed patient independent and no imbalance. Stand to Sit: 7: Independent;To chair/3-in-1 Ambulation/Gait Ambulation/Gait Assistance: 4: Min assist Ambulation/Gait Assistance Details (indicate cue type and reason): Min-guard assitance secondary to one episode of imbalance resulting in patient taking steps to correct. Continued to work on naming of items in pictures and signs, but patient without carry over from prior session. Apart from one episode of imbalance patient with improved stride length and speed of gait and 180 degree turns with 5 steps.  Ambulation Distance (Feet): 400 Feet Assistive device: None Gait Pattern: Within Functional Limits Stairs: Yes Stairs Assistance:  5: Supervision Stairs Assistance Details (indicate cue type and reason): Patient able to follow gestures/demonstration for selected task. Stair Management Technique: Two rails;Alternating pattern Number of Stairs: 5   High Level Balance High Level Balance Activites: Turns;Sudden stops;Head turns End of Session PT - End of Session Equipment Utilized During Treatment: Gait belt Activity Tolerance: Patient tolerated treatment well Patient left: in chair;with call bell in reach General Behavior During Session:  (impulsive at times) Cognition:  (Visibly frustrated when unable to comprehend) Cognitive Impairment: Improved initation today without physical assistance - gestures sufficient. Continues with signficant receptive aphasia.   Edwyna Perfect, PT  Pager [phone]  07/22/2011, 11:18 AM

## 2011-07-23 DIAGNOSIS — R319 Hematuria, unspecified: Secondary | ICD-10-CM | POA: Diagnosis not present

## 2011-07-23 LAB — BASIC METABOLIC PANEL
BUN: 20 mg/dL (ref 6–23)
CO2: 24 mEq/L (ref 19–32)
Calcium: 9.2 mg/dL (ref 8.4–10.5)
Chloride: 103 mEq/L (ref 96–112)
Creatinine, Ser: 1.12 mg/dL (ref 0.50–1.35)
GFR calc Af Amer: 72 mL/min — ABNORMAL LOW (ref >90)
GFR calc non Af Amer: 62 mL/min — ABNORMAL LOW (ref >90)
Glucose, Bld: 100 mg/dL — ABNORMAL HIGH (ref 70–99)
Potassium: 3.5 mEq/L (ref 3.5–5.1)
Sodium: 136 mEq/L (ref 135–145)

## 2011-07-23 LAB — PROTIME-INR
INR: 2.21 — ABNORMAL HIGH (ref 0.00–1.49)
Prothrombin Time: 24.9 seconds — ABNORMAL HIGH (ref 11.6–15.2)

## 2011-07-23 MED ORDER — WARFARIN SODIUM 5 MG PO TABS: 5.0000 mg | ORAL_TABLET | Freq: Every day | ORAL | Status: DC

## 2011-07-23 NOTE — Progress Notes (Signed)
Patient discharged to [facility].  Discharge packet provided to Bunnie Domino.  Discharged instructions provided to son and patient at bedside.  No questions at this time.  Patient discharged to care of son [name] for transport to facility.  Patient left unit in wheelchair with personal belongings in stable condition.  Osvaldo Angst, RN.

## 2011-07-23 NOTE — Progress Notes (Signed)
Logan Day discharged today to [facility]. Discharge information forwarded to facility and transportation was provided by patient's [name].  Genelle Bal, MSW, LCSW [phone]

## 2011-07-23 NOTE — Discharge Summary (Signed)
Physician Discharge Summary  NAME:Logan Day  ZOX:[id number]  DOB: 09-29-1932   Admit date: 07/15/2011 Discharge date: 07/23/2011  Admitting Diagnosis: Aphasia  Discharge Diagnoses:   Principal Problem:  *CVA (cerebral infarction) - embolic due to Afib.  Active Problems:  Aphasia S/P CVA  Hypokalemia  Atrial fibrillation  Hematuria, probably due to UTI  Hypertension  GERD, on chronic PPI therapy   Discharge Condition: Improved  Hospital Course: The patient is a 77 year old white male who presented with a severe expressive and receptive aphasia. He has intermittent atrial fibrillation and has refused warfarin treatment in the past. This was last addressed in April of 2012 by Dr. Katrinka Blazing. At that time, once again, the patient refused anticoagulation.  He presented with a severe expressive and receptive aphasia of several hours to one day duration. Therefore he was not thought to be a candidate for TPA.  CT scan did not confirm evidence of a stroke but MRI did show evidence of the left brain stroke in the appropriate area to hit his language area. He worked with physical therapy occupational therapy and speech therapy during the hospitalization. Main issues from a physical therapy and occupational therapy point of view were those of safety. He worked with speech therapy and improved slightly with some ability to understand a few words but his main motor communication is through gestures at this point.  Due to his deficits he is unsafe for independent living. He is therefore discharged at  [facility] for further treatment and therapies. He should be maintained on warfarin with an INR of 2-3. His last several doses in the hospital were 5 mg daily.  Consults: None  Disposition:   Discharge Orders    Future Orders Please Complete By Expires   Diet - low sodium heart healthy      Increase activity slowly      Comments:   Follow instructions of PT, OT, and ST at [facility] as  you improve     Current Discharge Medication List    START taking these medications   Details  warfarin (COUMADIN) 5 MG tablet Take 1 tablet (5 mg total) by mouth daily. Qty: 30 tablet, Refills: 12      CONTINUE these medications which have NOT CHANGED   Details  hydrochlorothiazide (HYDRODIURIL) 25 MG tablet Take 25 mg by mouth every morning.      omeprazole (PRILOSEC) 20 MG capsule Take 20 mg by mouth daily.      pravastatin (PRAVACHOL) 40 MG tablet Take 40 mg by mouth daily.      sertraline (ZOLOFT) 100 MG tablet Take 100 mg by mouth daily.      sotalol (BETAPACE) 120 MG tablet Take 60 mg by mouth 2 (two) times daily.         Follow-up Information    Follow up with Darnelle Bos, MD .         Things to follow up in the outpatient setting: none  Time coordinating discharge: 30 minutes including visit with patient, telephone call with son, in preparation of discharge papers.  SignedDarnelle Bos 07/23/2011, 8:15 AM

## 2011-12-01 ENCOUNTER — Encounter: Payer: Self-pay | Admitting: Internal Medicine

## 2012-10-12 ENCOUNTER — Other Ambulatory Visit: Payer: Self-pay | Admitting: Interventional Cardiology

## 2012-10-19 ENCOUNTER — Encounter (HOSPITAL_COMMUNITY)
Admission: AD | Disposition: A | Discharge status: Home / Self Care | Payer: Self-pay | Source: Ambulatory Visit | Attending: Interventional Cardiology

## 2012-10-19 ENCOUNTER — Ambulatory Visit (HOSPITAL_COMMUNITY)
Admission: AD | Admit: 2012-10-19 | Discharge: 2012-10-20 | Disposition: A | Discharge status: Home / Self Care | Payer: Medicare Other | Source: Ambulatory Visit | Attending: Interventional Cardiology | Admitting: Interventional Cardiology

## 2012-10-19 ENCOUNTER — Inpatient Hospital Stay (HOSPITAL_BASED_OUTPATIENT_CLINIC_OR_DEPARTMENT_OTHER)
Admission: RE | Admit: 2012-10-19 | Discharge: 2012-10-19 | Disposition: A | Discharge status: Home / Self Care | Payer: Medicare Other | Source: Ambulatory Visit | Attending: Interventional Cardiology | Admitting: Interventional Cardiology

## 2012-10-19 ENCOUNTER — Encounter (HOSPITAL_BASED_OUTPATIENT_CLINIC_OR_DEPARTMENT_OTHER)
Admission: RE | Disposition: A | Discharge status: Home / Self Care | Payer: Self-pay | Source: Ambulatory Visit | Attending: Interventional Cardiology

## 2012-10-19 ENCOUNTER — Encounter (HOSPITAL_BASED_OUTPATIENT_CLINIC_OR_DEPARTMENT_OTHER): Payer: Self-pay

## 2012-10-19 ENCOUNTER — Encounter (HOSPITAL_COMMUNITY): Payer: Self-pay | Admitting: General Practice

## 2012-10-19 DIAGNOSIS — R079 Chest pain, unspecified: Secondary | ICD-10-CM | POA: Insufficient documentation

## 2012-10-19 DIAGNOSIS — R4701 Aphasia: Secondary | ICD-10-CM | POA: Insufficient documentation

## 2012-10-19 DIAGNOSIS — Z7901 Long term (current) use of anticoagulants: Secondary | ICD-10-CM | POA: Insufficient documentation

## 2012-10-19 DIAGNOSIS — I2510 Atherosclerotic heart disease of native coronary artery without angina pectoris: Secondary | ICD-10-CM | POA: Insufficient documentation

## 2012-10-19 DIAGNOSIS — I4891 Unspecified atrial fibrillation: Secondary | ICD-10-CM | POA: Insufficient documentation

## 2012-10-19 DIAGNOSIS — I25119 Atherosclerotic heart disease of native coronary artery with unspecified angina pectoris: Secondary | ICD-10-CM | POA: Diagnosis present

## 2012-10-19 DIAGNOSIS — I69920 Aphasia following unspecified cerebrovascular disease: Secondary | ICD-10-CM | POA: Insufficient documentation

## 2012-10-19 DIAGNOSIS — R000 Tachycardia, unspecified: Secondary | ICD-10-CM | POA: Insufficient documentation

## 2012-10-19 DIAGNOSIS — I209 Angina pectoris, unspecified: Secondary | ICD-10-CM | POA: Insufficient documentation

## 2012-10-19 HISTORY — PX: PERCUTANEOUS CORONARY STENT INTERVENTION (PCI-S): SHX5485

## 2012-10-19 HISTORY — DX: Gastro-esophageal reflux disease without esophagitis: K21.9

## 2012-10-19 HISTORY — PX: CORONARY ANGIOPLASTY WITH STENT PLACEMENT: SHX49

## 2012-10-19 LAB — PROTIME-INR
INR: 2.15 — ABNORMAL HIGH (ref 0.00–1.49)
Prothrombin Time: 23.1 seconds — ABNORMAL HIGH (ref 11.6–15.2)

## 2012-10-19 LAB — POCT ACTIVATED CLOTTING TIME: Activated Clotting Time: 731 seconds

## 2012-10-19 SURGERY — PERCUTANEOUS CORONARY STENT INTERVENTION (PCI-S)
Anesthesia: LOCAL

## 2012-10-19 SURGERY — JV RIGHT HEART CATHETERIZATION

## 2012-10-19 MED ORDER — SODIUM CHLORIDE 0.9 % IV SOLN
0.2500 mg/kg/h | INTRAVENOUS | Status: AC
  Filled 2012-10-19: qty 250

## 2012-10-19 MED ORDER — NITROGLYCERIN 1 MG/10 ML FOR IR/CATH LAB
INTRA_ARTERIAL | Status: AC
  Filled 2012-10-19: qty 10

## 2012-10-19 MED ORDER — ASPIRIN 81 MG PO CHEW
324.0000 mg | CHEWABLE_TABLET | ORAL | Status: AC
  Administered 2012-10-19: 324 mg via ORAL

## 2012-10-19 MED ORDER — SODIUM CHLORIDE 0.9 % IJ SOLN: 3.0000 mL | Freq: Two times a day (BID) | INTRAMUSCULAR | Status: DC

## 2012-10-19 MED ORDER — ONDANSETRON HCL 4 MG/2ML IJ SOLN: 4.0000 mg | Freq: Four times a day (QID) | INTRAMUSCULAR | Status: DC | PRN

## 2012-10-19 MED ORDER — HEPARIN (PORCINE) IN NACL 2-0.9 UNIT/ML-% IJ SOLN
INTRAMUSCULAR | Status: AC
  Filled 2012-10-19: qty 1000

## 2012-10-19 MED ORDER — WARFARIN - PHYSICIAN DOSING INPATIENT
Freq: Every day | Status: DC
  Administered 2012-10-19: 20:00:00

## 2012-10-19 MED ORDER — SIMVASTATIN 20 MG PO TABS
20.0000 mg | ORAL_TABLET | Freq: Every day | ORAL | Status: DC
  Administered 2012-10-19: 20:00:00 20 mg via ORAL
  Filled 2012-10-19 (×2): qty 1

## 2012-10-19 MED ORDER — DIAZEPAM 5 MG PO TABS
5.0000 mg | ORAL_TABLET | ORAL | Status: AC
  Administered 2012-10-19: 5 mg via ORAL

## 2012-10-19 MED ORDER — CLOPIDOGREL BISULFATE 75 MG PO TABS
75.0000 mg | ORAL_TABLET | Freq: Every day | ORAL | Status: DC
  Administered 2012-10-20: 75 mg via ORAL
  Filled 2012-10-19: qty 1

## 2012-10-19 MED ORDER — SODIUM CHLORIDE 0.9 % IJ SOLN: 3.0000 mL | INTRAMUSCULAR | Status: DC | PRN

## 2012-10-19 MED ORDER — HYDROCHLOROTHIAZIDE 25 MG PO TABS
25.0000 mg | ORAL_TABLET | ORAL | Status: DC
  Administered 2012-10-19 – 2012-10-20 (×2): 25 mg via ORAL
  Filled 2012-10-19 (×4): qty 1

## 2012-10-19 MED ORDER — BIVALIRUDIN 250 MG IV SOLR
INTRAVENOUS | Status: AC
  Filled 2012-10-19: qty 250

## 2012-10-19 MED ORDER — SODIUM CHLORIDE 0.9 % IV SOLN
INTRAVENOUS | Status: AC
  Administered 2012-10-19: 19:00:00 via INTRAVENOUS

## 2012-10-19 MED ORDER — ACETAMINOPHEN 325 MG PO TABS: 650.0000 mg | ORAL_TABLET | ORAL | Status: DC | PRN

## 2012-10-19 MED ORDER — SODIUM CHLORIDE 0.9 % IV SOLN: INTRAVENOUS | Status: DC

## 2012-10-19 MED ORDER — VERAPAMIL HCL 2.5 MG/ML IV SOLN
INTRAVENOUS | Status: AC
  Filled 2012-10-19: qty 2

## 2012-10-19 MED ORDER — LIDOCAINE HCL (PF) 1 % IJ SOLN
INTRAMUSCULAR | Status: AC
  Filled 2012-10-19: qty 30

## 2012-10-19 MED ORDER — WARFARIN SODIUM 10 MG PO TABS
10.0000 mg | ORAL_TABLET | Freq: Once | ORAL | Status: AC
  Administered 2012-10-19: 10 mg via ORAL
  Filled 2012-10-19: qty 1

## 2012-10-19 MED ORDER — SOTALOL HCL 120 MG PO TABS
60.0000 mg | ORAL_TABLET | Freq: Two times a day (BID) | ORAL | Status: DC
  Administered 2012-10-19 – 2012-10-20 (×2): 60 mg via ORAL
  Filled 2012-10-19 (×4): qty 0.5

## 2012-10-19 MED ORDER — CLOPIDOGREL BISULFATE 300 MG PO TABS
ORAL_TABLET | ORAL | Status: AC
  Filled 2012-10-19: qty 2

## 2012-10-19 MED ORDER — ASPIRIN 81 MG PO CHEW
81.0000 mg | CHEWABLE_TABLET | Freq: Every day | ORAL | Status: DC
  Administered 2012-10-20: 10:00:00 81 mg via ORAL
  Filled 2012-10-19: qty 1

## 2012-10-19 MED ORDER — PANTOPRAZOLE SODIUM 40 MG PO TBEC
40.0000 mg | DELAYED_RELEASE_TABLET | Freq: Every day | ORAL | Status: DC
  Administered 2012-10-19 – 2012-10-20 (×2): 40 mg via ORAL
  Filled 2012-10-19 (×2): qty 1

## 2012-10-19 MED ORDER — SODIUM CHLORIDE 0.9 % IV SOLN: 250.0000 mL | INTRAVENOUS | Status: DC | PRN

## 2012-10-19 MED ORDER — MIDAZOLAM HCL 2 MG/2ML IJ SOLN
INTRAMUSCULAR | Status: AC
  Filled 2012-10-19: qty 2

## 2012-10-19 MED ORDER — SERTRALINE HCL 100 MG PO TABS
100.0000 mg | ORAL_TABLET | Freq: Every day | ORAL | Status: DC
  Administered 2012-10-19 – 2012-10-20 (×2): 100 mg via ORAL
  Filled 2012-10-19 (×2): qty 1

## 2012-10-19 NOTE — CV Procedure (Signed)
   PERCUTANEOUS CORONARY INTERVENTION   Logan Day is a 79 y.o. male  INDICATION: [name] has a several month history of chest discomfort has been difficult to assess because of an expressive aphasia. We initially started evaluating the patient because of exertional chest complaints. The patient fell was atrial fibrillation. He has a history of atrial fibrillation and is on antiarrhythmic therapy. Continuous monitoring did not demonstrate very much atrial fibrillation and there was no correlation with symptoms. He had multiple episodes of wide complex tachycardia while in brief episodes of atrial fibrillation suggesting the possibility of aberration (Ashman's phenomenon) cannot exclude DVT. Because of the wide complex tachycardia he underwent nuclear testing which demonstrated stress-induced left ventricular enlargement. Because of the patient's expressive aphasia, wide complex tachycardia, and ischemic dilatation on nuclear testing angiography was performed. Angiography demonstrated a 99% proximal to mid circumflex stenosis above a region previously stented in 1997. After discussing the findings with the patient and his son [name] we have decided to proceed with stenting of the circumflex. My dysfunction is that his exertional chest complaints are related to angina. We were not able to document that atrial fibrillation had anything to do with his complaints.   PROCEDURE: Drug-eluting stent implantation proximal to mid circumflex   CONSENT: The risks, benefits, and details of the procedure were explained to the patient. Risks including death, MI, stroke, bleeding, limb ischemia, renal failure and allergy were described and accepted by the patient.  Informed written consent was obtained prior to proceeding.  PROCEDURE TECHNIQUE:  After Xylocaine anesthesia a 6 [place] was exchanged for the 5 French sheath used in the diagnostic laboratory in the right radial artery . Coronary guiding shots were  made using a 6 French 3.5 cm CLS guide catheter. Antithrombotic therapy, bivalirudin bolus and infusion, was begun and determined to be therapeutic by ACT. Antiplatelet therapy, Plavix 600 mg, was loaded.  After documenting the ACT we easily cross the severe stenosis with a poor the guidewire. Predilatation was performed using a 2.5 x 12 mm long emerged balloon. We then positioned and deployed a Promus Premier 2.75 x 16 mm long DES. It was deployed at 13 atmospheres. A 3.0 x 12 mm long [place] Quantum was then used for post dilatation. 2 balloon inflations to 15 atmospheres were performed.  Hemostasis was achieved with the wrist band using 17 cc.  CONTRAST:  Total of 100 cc.  COMPLICATIONS:  None.    ANGIOGRAPHIC RESULTS:   99% proximal to mid native circumflex reduced to 0% with TIMI grade 3 flow after stenting.   IMPRESSIONS:  Successful DES implantation proximal to mid circumflex as noted above   RECOMMENDATION:  Aspirin Plavix and Coumadin for one month and then Plavix and Coumadin thereafter for one year.    Lesleigh Noe, MD 10/19/2012 10:24 AM

## 2012-10-19 NOTE — Progress Notes (Signed)
Utilization Review Completed Porscha Axley J. Nayson Traweek, RN, BSN, NCM [phone]  

## 2012-10-19 NOTE — OR Nursing (Signed)
+  Allen's test right hand 

## 2012-10-19 NOTE — CV Procedure (Signed)
     Diagnostic Cardiac Catheterization Report  Logan Day  79 y.o.  male 02-Jun-1933  Procedure Date: 10/19/2012 Referring Physician: Theressa Millard, M.D. Primary Cardiologist:: HW B. Katrinka Blazing the third M.D.   PROCEDURE:  Left heart catheterization with selective coronary angiography, left ventriculogram.  INDICATIONS:  79 year old gentleman with vague recurring chest complaints both exertional and nonexertional over the past 3 months he has a history of coronary disease and paroxysmal atrial fibrillation. He is been on sotalol therapy for years. Continuous monitoring demonstrated frequent episodes of wide complex tachycardia, he-Ms. phenomenon in atrial fibrillation or ventricular tachycardia. Because of the right complex tachycardia a myocardial perfusion study was performed and demonstrated stress induced left ventricular dilatation. Cardiac catheterization is being performed to rule out progression of coronary disease and ischemia mediated ventricular arrhythmias. The patient has an expressive aphasia which prevents accurate symptom expression.  The risks, benefits, and details of the procedure were explained to the patient.  The patient verbalized understanding and wanted to proceed.  Informed written consent was obtained.  PROCEDURE TECHNIQUE:  After Xylocaine anesthesia a 5 French sheath was placed in the right radial artery with a single anterior needle wall stick.   Coronary angiography was done using a 5 [place] JR 4 and 5 [place] JL 3.5 diagnostic catheter.  Left ventriculography was done using a 5 [place] JR catheter by hand inject.    CONTRAST:  Total of 60 cc.  COMPLICATIONS:  None.    HEMODYNAMICS:  Aortic pressure was 115/50 mm mercury; LV pressure was 110/8 mmHg; LVEDP 8 mm mercury.  There was no gradient between the left ventricle and aorta.    ANGIOGRAPHIC DATA:   The left main coronary artery is calcified but widely patent.  The left anterior descending artery is  moderate proximal and mid plaque with calcification but no high-grade obstruction..  The left circumflex artery is 99% obstruction in the circumflex mid vessel proximal to the previously placed stent 2 large obtuse marginal branches arise beyond..  The right coronary artery is nondominant with luminal irregularities in the mid vessel.  LEFT VENTRICULOGRAM:  Left ventricular angiogram was done in the 30 RAO projection and revealed abnormal left ventricular wall motion with severe hypokinesis in the inferobasal and mid inferior wall.  LVEF is 40%  IMPRESSIONS:  1. Greater than 95% obstruction in the mid circumflex which is a dominant vessel supplying the PDA territory and lateral wall. Matching wall motion abnormality is noted on left ventriculography. Symptoms are difficult to interpret because of the patient's expressive aphasia from prior infarction.  2. Nondominant right coronary and widely patent LAD.  3. Wide complex tachycardia noted on monitoring reflecting attachments phenomenon or ventricular tachycardia.  4. Decreased left ventricular function   RECOMMENDATION:  Under the circumstances, PCI on the circumflex is warranted to decrease the risk of ischemia mediated ventricular tachycardia. The patient is likely symptomatic but is unable to clearly express his symptoms because of prior stroke. He was complicated because he requires antithrombotic therapy for previous stroke and history of paroxysmal atrial fibrillation. We will place a drug-eluting stent, use 3 drug therapy for one month, and then changed to Plavix and Coumadin thereafter.

## 2012-10-19 NOTE — H&P (Signed)
Mr. Langhorst has a history of coronary disease with prior circumflex stenting. His recent been having chest pain and palpitations. A monitor demonstrated wide complex tachycardia. A nuclear study raised the possibility of ischemic dilatation. We are doing the study to rule out advanced three-vessel coronary disease that could be contributing to his symptoms and the right complex tachycardia. The patient and family understand the indication for the procedure. The procedure including the risks of stroke, death, myocardial infarction, limb ischemia, bleeding, allergy, kidney failure, among others were discussed in detail except about the patient who signed the consent to

## 2012-10-20 DIAGNOSIS — R9439 Abnormal result of other cardiovascular function study: Secondary | ICD-10-CM | POA: Diagnosis present

## 2012-10-20 DIAGNOSIS — Z7901 Long term (current) use of anticoagulants: Secondary | ICD-10-CM

## 2012-10-20 LAB — BASIC METABOLIC PANEL
CO2: 26 mEq/L (ref 19–32)
Calcium: 8.7 mg/dL (ref 8.4–10.5)
Chloride: 103 mEq/L (ref 96–112)
GFR calc non Af Amer: 48 mL/min — ABNORMAL LOW (ref >90)
Potassium: 3.3 mEq/L — ABNORMAL LOW (ref 3.5–5.1)
Sodium: 139 mEq/L (ref 135–145)

## 2012-10-20 LAB — CBC
MCV: 88 fL (ref 78.0–100.0)
Platelets: 164 10*3/uL (ref 150–400)
RBC: 4.24 MIL/uL (ref 4.22–5.81)
RDW: 14.2 % (ref 11.5–15.5)
WBC: 7.6 10*3/uL (ref 4.0–10.5)

## 2012-10-20 MED ORDER — ASPIRIN 81 MG PO CHEW: 81.0000 mg | CHEWABLE_TABLET | Freq: Every day | ORAL | Status: DC

## 2012-10-20 MED ORDER — CLOPIDOGREL BISULFATE 75 MG PO TABS: 75.0000 mg | ORAL_TABLET | Freq: Every day | ORAL | Status: DC

## 2012-10-20 NOTE — Progress Notes (Signed)
CARDIAC REHAB PHASE I   PRE:  Rate/Rhythm: 64SR  BP:  Supine:   Sitting:   Standing: 127/68   SaO2:   MODE:  Ambulation: 800 ft   POST:  Rate/Rhythm: 69SR  BP:  Supine:   Sitting: 138/75  Standing:    SaO2:  [id number] Pt walked 800 ft with handheld asst. A little wobbly but not loss of balance. Education completed. Written material on diet and exercise given. Pt with expressive aphasia and difficult to get thoughts across at times. Discussed CRP 2 but pt not interested. Will exercise on his own. Did not have stent booklet in room. Pt's RN to ask son if he has it when he comes.   Luetta Nutting, RN BSN  10/20/2012 9:29 AM

## 2012-10-20 NOTE — Discharge Summary (Addendum)
Patient ID: NATURE VOGELSANG MRN: [id number] DOB/AGE: 06-Nov-1932 79 y.o.  Admit date: 10/19/2012 Discharge date: 10/20/2012  Primary Discharge Diagnosis: Angina pectoris despite medical therapy and abnormal high risk nuclear study Secondary Discharge Diagnosis: Chronic coronary artery disease with prior circumflex stent  Chronic atrial fibrillation  Embolic CVA with persistent expressive aphasia  Chronic Coumadin anticoagulation, and requirement for dual antiplatelet therapy with new stent implantation, 10/19/2012  Significant Diagnostic Studies: Drug-eluting stent implantation 10/19/2012  Consults: None  Hospital Course: The patient underwent diagnostic catheterization an outpatient laboratory. The procedure was performed from the radial approach. We found a greater than 95% stenosis in the proximal to mid circumflex in a previously untreated area. The previously placed stent in the mid circumflex was widely patent.  The background history of intermittent exertional tightness in the chest and fatigue was poorly articulated by the patient because of his aphasia. Aphasia led to significant delays in identifying the underlying complaint. Continuous ambulatory monitoring demonstrated episodes of wide complex tachycardia due to paroxysmal atrial fibrillation with aberration (Ashman's phenomenon) versus nonsustained ventricular tachycardia. Nuclear testing demonstrated an inferior defect without ischemia and stress induced LV dilatation (a high-risk feature).  The patient has a prior history of CVA and has been off Coumadin for several days leading to the diagnostic cath. An unconventional bridging process of using Xarelto was used. Because we wanted to long interruption of Coumadin therapy we proceeded with PCI of the circumflex. He was transferred to the inpatient setting where a drug-eluting stent was placed in the proximal to mid circumflex without complications. The patient received  Coumadin 10 mg last evening. INR this morning is 1.14. He will receive 20 mg today and resume his usual Coumadin dosing. He will have followup in Coumadin clinic in 4 days. Additionally the patient is being discharged on 81 mg of aspirin and Plavix 75 mg per day to  On the morning of discharge the patient is asymptomatic. Bradycardia has been noted on his monitor during the night. The creatinine is stable at 1.38 on the morning of discharge.   Discharge Exam: Blood pressure 140/71, pulse 54, temperature 98 F (36.7 C), temperature source Oral, resp. rate 18, height 5\' 10"  (1.778 m), weight 82 kg (180 lb 12.4 oz), SpO2 94.00%.    The radial cath site is unremarkable  The neurological status is unchanged with obvious Brocca's aphasia.  The lungs are clear. Labs:   Lab Results  Component Value Date   WBC 7.6 10/20/2012   HGB 12.7* 10/20/2012   HCT 37.3* 10/20/2012   MCV 88.0 10/20/2012   PLT 164 10/20/2012    Recent Labs Lab 10/20/12 0555  NA 139  K 3.3*  CL 103  CO2 26  BUN 20  CREATININE 1.38*  CALCIUM 8.7  GLUCOSE 89   Lab Results  Component Value Date   CKTOTAL 151 07/16/2011   CKMB 3.6 07/16/2011   TROPONINI <0.30 07/16/2011    Lab Results  Component Value Date   CHOL 153 07/16/2011   CHOL 173 07/15/2011   Lab Results  Component Value Date   HDL 37* 07/16/2011   HDL 46 07/15/2011   Lab Results  Component Value Date   LDLCALC 89 07/16/2011   LDLCALC 97 07/15/2011   Lab Results  Component Value Date   TRIG 137 07/16/2011   TRIG 148 07/15/2011   Lab Results  Component Value Date   CHOLHDL 4.1 07/16/2011   CHOLHDL 3.8 07/15/2011   No results found for  this basename: LDLDIRECT      Radiology: No new data EKG:  Sinus bradycardia with relative QT prolongation  FOLLOW UP PLANS AND APPOINTMENTS    Medication List    TAKE these medications       aspirin 81 MG chewable tablet  Chew 1 tablet (81 mg total) by mouth daily.     buPROPion 150 MG 24 hr  tablet  Commonly known as:  WELLBUTRIN XL  Take 150 mg by mouth every evening.     clopidogrel 75 MG tablet  Commonly known as:  PLAVIX  Take 1 tablet (75 mg total) by mouth daily with breakfast.     hydrochlorothiazide 25 MG tablet  Commonly known as:  HYDRODIURIL  Take 25 mg by mouth every morning.     omeprazole 20 MG capsule  Commonly known as:  PRILOSEC  Take 20 mg by mouth every morning.     pravastatin 40 MG tablet  Commonly known as:  PRAVACHOL  Take 40 mg by mouth every evening.     sertraline 100 MG tablet  Commonly known as:  ZOLOFT  Take 100 mg by mouth every evening.     sotalol 120 MG tablet  Commonly known as:  BETAPACE  Take 120 mg by mouth 2 (two) times daily.     traMADol 50 MG tablet  Commonly known as:  ULTRAM  Take 50 mg by mouth 2 (two) times daily as needed for pain.     warfarin 4 MG tablet  Commonly known as:  COUMADIN  Take 2-4 mg by mouth every evening. 2 mg on mon and wed, then 4mg  all other days           Follow-up Information   Follow up with Lesleigh Noe, MD On 10/25/2012. (2PM in coumadin clinic and Dr. Katrinka Blazing)    Contact information:   [address] [place] [address] [phone]       BRING ALL MEDICATIONS WITH YOU TO FOLLOW UP APPOINTMENTS  Time spent with patient to include physician time: 20 minutes  Signed: Lesleigh Noe 10/20/2012, 7:50 AM

## 2013-10-26 ENCOUNTER — Other Ambulatory Visit: Payer: Self-pay

## 2013-10-26 MED ORDER — SOTALOL HCL 120 MG PO TABS: 120.0000 mg | ORAL_TABLET | Freq: Two times a day (BID) | ORAL | Status: DC

## 2013-11-22 ENCOUNTER — Ambulatory Visit
Admission: RE | Admit: 2013-11-22 | Discharge: 2013-11-22 | Disposition: A | Discharge status: Home / Self Care | Payer: Medicare Other | Source: Ambulatory Visit | Attending: Internal Medicine | Admitting: Internal Medicine

## 2013-11-22 ENCOUNTER — Other Ambulatory Visit: Payer: Self-pay | Admitting: Internal Medicine

## 2013-11-22 DIAGNOSIS — J209 Acute bronchitis, unspecified: Secondary | ICD-10-CM

## 2013-12-18 ENCOUNTER — Ambulatory Visit (INDEPENDENT_AMBULATORY_CARE_PROVIDER_SITE_OTHER): Payer: Medicare Other | Admitting: Interventional Cardiology

## 2013-12-18 ENCOUNTER — Encounter: Payer: Self-pay | Admitting: Interventional Cardiology

## 2013-12-18 VITALS — BP 131/73 | HR 58 | Ht 72.0 in | Wt 163.0 lb

## 2013-12-18 DIAGNOSIS — I69920 Aphasia following unspecified cerebrovascular disease: Secondary | ICD-10-CM

## 2013-12-18 DIAGNOSIS — I25119 Atherosclerotic heart disease of native coronary artery with unspecified angina pectoris: Secondary | ICD-10-CM

## 2013-12-18 DIAGNOSIS — I4891 Unspecified atrial fibrillation: Secondary | ICD-10-CM

## 2013-12-18 DIAGNOSIS — I2510 Atherosclerotic heart disease of native coronary artery without angina pectoris: Secondary | ICD-10-CM

## 2013-12-18 DIAGNOSIS — Z7901 Long term (current) use of anticoagulants: Secondary | ICD-10-CM | POA: Insufficient documentation

## 2013-12-18 DIAGNOSIS — I10 Essential (primary) hypertension: Secondary | ICD-10-CM | POA: Insufficient documentation

## 2013-12-18 DIAGNOSIS — I209 Angina pectoris, unspecified: Secondary | ICD-10-CM

## 2013-12-18 DIAGNOSIS — I69320 Aphasia following cerebral infarction: Secondary | ICD-10-CM

## 2013-12-18 MED ORDER — ASPIRIN EC 81 MG PO TBEC: 81.0000 mg | DELAYED_RELEASE_TABLET | Freq: Every day | ORAL | Status: DC

## 2013-12-18 NOTE — Patient Instructions (Signed)
Your physician has recommended you make the following change in your medication:  1) STOP PLAVIX 2) START ASPIRIN 81MG  DAILY  Take all other medications as prescribed  Your physician wants you to follow-up in: 6 months You will receive a reminder letter in the mail two months in advance. If you don't receive a letter, please call our office to schedule the follow-up appointment.

## 2013-12-18 NOTE — Progress Notes (Signed)
Patient ID: Logan Day, male   DOB: 12/20/1932, 80 y.o.   MRN: [id number]    [address], [address] [place],   [address] Phone: [phone] Fax:  [phone]  Date:  12/18/2013   ID:  Logan Day, DOB October 22, 1932, MRN [id number]  PCP:  Horton Finer, MD   ASSESSMENT:  1. Coronary artery disease, status post PCI most recently in April 2014, on Plavix along with Coumadin  2. History of PAF, being treated with sotalol for rhythm control 3. Chronic sotalol therapy 4. Chronic anticoagulation therapy with Coumadin 5. Prior embolic CVA with expressive aphasia  PLAN:  1. Resume baby aspirin 81 mg per day and discontinue Plavix 2. Continue sotalol 3. Clinical followup 6 months    SUBJECTIVE: Logan Day is a 80 y.o. male who is done well after circumflex stent slightly greater than a year ago. Dual antiplatelet therapy and Coumadin were used for one month and then aspirin was discontinued.[name] He denies angina, dyspnea, palpitations, and syncope. No bleeding on Plavix and Coumadin.   Wt Readings from Last 3 Encounters:  12/18/13 163 lb (73.936 kg)  10/20/12 180 lb 12.4 oz (82 kg)  10/19/12 185 lb (83.915 kg)     Past Medical History  Diagnosis Date  . Hypertension   . Atrial fibrillation   . Hypercholesteremia   . MI (myocardial infarction)     x 2  . Angina   . Cancer     prostate cancer  . Coronary artery disease     Circ stent (2009 cath patent)  . Prostate cancer   . Hyperlipidemia   . MI (myocardial infarction)   . TIA (transient ischemic attack)   . GERD (gastroesophageal reflux disease)     Current Outpatient Prescriptions  Medication Sig Dispense Refill  . buPROPion (WELLBUTRIN XL) 150 MG 24 hr tablet Take 150 mg by mouth every evening.      . clopidogrel (PLAVIX) 75 MG tablet Take 1 tablet (75 mg total) by mouth daily with breakfast.  30 tablet  11  . hydrochlorothiazide (HYDRODIURIL) 25 MG tablet Take 25 mg by mouth every  morning.      [name] omeprazole (PRILOSEC) 20 MG capsule Take 20 mg by mouth every morning.      . pravastatin (PRAVACHOL) 40 MG tablet Take 40 mg by mouth every evening.      . sertraline (ZOLOFT) 100 MG tablet Take 100 mg by mouth every evening.      . sotalol (BETAPACE) 120 MG tablet Take 1 tablet (120 mg total) by mouth 2 (two) times daily.  180 tablet  0  . traMADol (ULTRAM) 50 MG tablet Take 50 mg by mouth 2 (two) times daily as needed for pain.      [name] warfarin (COUMADIN) 4 MG tablet Take 2-4 mg by mouth every evening. 2 mg on mon and wed, then 4mg  all other days       No current facility-administered medications for this visit.    Allergies:   No Known Allergies  Social History:  The patient  reports that he has quit smoking. His smokeless tobacco use includes Chew. He reports that he does not drink alcohol or use illicit drugs.   ROS:  Please see the history of present illness.   Denies angina, transient neurological symptoms, syncope, and palpitations. He is communication difficulties because of prior stroke   All other systems reviewed and negative.   OBJECTIVE: VS:  BP 131/73  Pulse  58  Ht 6' (1.829 m)  Wt 163 lb (73.936 kg)  BMI 22.10 kg/m2 Well nourished, well developed, in no acute distress, elderly HEENT: normal Neck: JVD flat. Carotid bruit absent  Cardiac:  normal S1, S2; RRR; no murmur Lungs:  clear to auscultation bilaterally, no wheezing, rhonchi or rales Abd: soft, nontender, no hepatomegaly Ext: Edema absent. Pulses 2+ Skin: warm and dry Neuro:  CNs 2-12 intact, no focal abnormalities noted  EKG:  Normal sinus rhythm/bradycardia with slight prolongation of QT see at 482 ms, otherwise normal.       Signed, Illene Labrador III, MD 12/18/2013 1:59 PM

## 2013-12-28 ENCOUNTER — Ambulatory Visit
Admission: RE | Admit: 2013-12-28 | Discharge: 2013-12-28 | Disposition: A | Discharge status: Home / Self Care | Payer: Medicare Other | Source: Ambulatory Visit | Attending: Internal Medicine | Admitting: Internal Medicine

## 2013-12-28 ENCOUNTER — Other Ambulatory Visit: Payer: Self-pay | Admitting: Internal Medicine

## 2013-12-28 DIAGNOSIS — J209 Acute bronchitis, unspecified: Secondary | ICD-10-CM

## 2014-01-14 ENCOUNTER — Telehealth: Payer: Self-pay | Admitting: Interventional Cardiology

## 2014-01-14 MED ORDER — METOPROLOL SUCCINATE ER 25 MG PO TB24: 25.0000 mg | ORAL_TABLET | Freq: Every day | ORAL | Status: DC

## 2014-01-14 NOTE — Telephone Encounter (Signed)
New problem   Pt is in afib and need to speak to nurse concern possibility coming into office.

## 2014-01-14 NOTE — Telephone Encounter (Signed)
returned pt son call.adv pt  son that Dr.Smith is aware that pt was seen by his pcp Dr.Griffith and pt was in afib.Dr.Smith recommended metoprolol 25mg  qd be added.adv him that with add to betablocker Dr.Smith was hoping pt would convert to NSR on his own. Pt currently anticoagulated on Coumadin and on anti arrhythmic Betapace.pt son sts that he has not spoken with pt today.he will call him and call back with an update.pt son sts that pt knows when his is out of rhythm

## 2014-01-14 NOTE — Telephone Encounter (Signed)
pt son adv that if pt is doing ok we dont need to sched ule an appt with Dr.Smith. pt to call the office if symptoms develop.pt son agreeable with plan and verbalized understanding.

## 2014-01-14 NOTE — Telephone Encounter (Signed)
pt son sts that pt is back in NSR and feels fine.pt sons wants to know if he needs to f/u with Dr.Smith.adv pt son I will talk with Dr.Smith and call hom back.pt son agreeable with plan

## 2014-01-29 ENCOUNTER — Other Ambulatory Visit: Payer: Self-pay | Admitting: *Deleted

## 2014-01-29 MED ORDER — SOTALOL HCL 120 MG PO TABS: 120.0000 mg | ORAL_TABLET | Freq: Two times a day (BID) | ORAL | Status: DC

## 2014-04-26 LAB — PROTIME-INR: INR: 2.4 — AB (ref 0.9–1.1)

## 2014-05-31 ENCOUNTER — Encounter: Payer: Self-pay | Admitting: Diagnostic Neuroimaging

## 2014-05-31 ENCOUNTER — Ambulatory Visit (INDEPENDENT_AMBULATORY_CARE_PROVIDER_SITE_OTHER): Payer: Medicare Other | Admitting: Diagnostic Neuroimaging

## 2014-05-31 VITALS — BP 146/79 | HR 56 | Temp 97.0°F | Ht 72.0 in | Wt 151.8 lb

## 2014-05-31 DIAGNOSIS — I480 Paroxysmal atrial fibrillation: Secondary | ICD-10-CM

## 2014-05-31 DIAGNOSIS — I6930 Unspecified sequelae of cerebral infarction: Secondary | ICD-10-CM

## 2014-05-31 DIAGNOSIS — R269 Unspecified abnormalities of gait and mobility: Secondary | ICD-10-CM

## 2014-05-31 DIAGNOSIS — R413 Other amnesia: Secondary | ICD-10-CM

## 2014-05-31 DIAGNOSIS — G609 Hereditary and idiopathic neuropathy, unspecified: Secondary | ICD-10-CM

## 2014-05-31 DIAGNOSIS — Z8673 Personal history of transient ischemic attack (TIA), and cerebral infarction without residual deficits: Secondary | ICD-10-CM

## 2014-05-31 NOTE — Patient Instructions (Signed)
Consider home health agency referral for physical therapy and home aid.  Please transition to stop driving.
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
## 2014-07-11 ENCOUNTER — Encounter (HOSPITAL_COMMUNITY): Payer: Self-pay | Admitting: Interventional Cardiology

## 2014-08-01 ENCOUNTER — Other Ambulatory Visit: Payer: Self-pay | Admitting: Interventional Cardiology

## 2014-09-02 ENCOUNTER — Encounter: Payer: Self-pay | Admitting: Diagnostic Neuroimaging

## 2014-09-02 ENCOUNTER — Ambulatory Visit (INDEPENDENT_AMBULATORY_CARE_PROVIDER_SITE_OTHER): Payer: Medicare Other | Admitting: Diagnostic Neuroimaging

## 2014-09-02 VITALS — BP 119/70 | HR 70 | Temp 97.0°F | Ht 71.0 in | Wt 147.0 lb

## 2014-09-02 DIAGNOSIS — F03B0 Unspecified dementia, moderate, without behavioral disturbance, psychotic disturbance, mood disturbance, and anxiety: Secondary | ICD-10-CM

## 2014-09-02 DIAGNOSIS — F0390 Unspecified dementia without behavioral disturbance: Secondary | ICD-10-CM
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
## 2014-09-02 NOTE — Patient Instructions (Signed)
I will setup home health agency referral.

## 2014-09-04 ENCOUNTER — Telehealth: Payer: Self-pay

## 2014-09-04 NOTE — Telephone Encounter (Signed)
[facility] nurse called and stated they received patient's referral and they will be going to visit him.

## 2014-09-05 ENCOUNTER — Other Ambulatory Visit: Payer: Self-pay | Admitting: Interventional Cardiology

## 2014-09-05 ENCOUNTER — Telehealth: Payer: Self-pay | Admitting: *Deleted

## 2014-09-05 ENCOUNTER — Telehealth: Payer: Self-pay | Admitting: Diagnostic Neuroimaging

## 2014-09-05 NOTE — Telephone Encounter (Signed)
Spoke with Dr. Leta Baptist about Arnold's request for a verbal order for speech therapy. Maudry Mayhew, RN at [facility] and informed her that I was calling to give her the verbal order for Dr. Leta Baptist.

## 2014-09-05 NOTE — Telephone Encounter (Signed)
Roselie Awkward with [facility] is calling to advise that he will be seeing the patient twice a week for two weeks. Roselie Awkward also needs a verbal order for a speech therapist. Please call and advise. Thank you.

## 2014-09-16 ENCOUNTER — Other Ambulatory Visit: Payer: Self-pay | Admitting: Interventional Cardiology

## 2014-09-16 NOTE — Telephone Encounter (Signed)
PATIENT NEEDS A FOLLOW UP APPT FOR REFILLS

## 2014-11-22 ENCOUNTER — Other Ambulatory Visit: Payer: Self-pay

## 2014-11-22 MED ORDER — SOTALOL HCL 120 MG PO TABS: 120.0000 mg | ORAL_TABLET | Freq: Two times a day (BID) | ORAL | Status: DC

## 2014-11-26 ENCOUNTER — Telehealth: Payer: Self-pay | Admitting: Adult Health

## 2014-11-26 NOTE — Telephone Encounter (Signed)
Received fax refill request  Rx # [id number] Medication:  Sotalol HCL 120 mg tab Qty 60 Sig:  Take one tablet by mouth twice daily Physician:  Purcell Nails

## 2014-11-29 ENCOUNTER — Emergency Department (HOSPITAL_COMMUNITY): Payer: Medicare Other

## 2014-11-29 ENCOUNTER — Inpatient Hospital Stay (HOSPITAL_COMMUNITY)
Admission: EM | Admit: 2014-11-29 | Discharge: 2014-12-04 | DRG: 189 | Disposition: A | Discharge status: Skilled Nursing Facility | Payer: Medicare Other | Attending: Internal Medicine | Admitting: Internal Medicine

## 2014-11-29 ENCOUNTER — Encounter (HOSPITAL_COMMUNITY): Payer: Self-pay | Admitting: Neurology

## 2014-11-29 DIAGNOSIS — E440 Moderate protein-calorie malnutrition: Secondary | ICD-10-CM | POA: Diagnosis present

## 2014-11-29 DIAGNOSIS — Z7901 Long term (current) use of anticoagulants: Secondary | ICD-10-CM

## 2014-11-29 DIAGNOSIS — J810 Acute pulmonary edema: Secondary | ICD-10-CM

## 2014-11-29 DIAGNOSIS — Z7982 Long term (current) use of aspirin: Secondary | ICD-10-CM | POA: Diagnosis not present

## 2014-11-29 DIAGNOSIS — I252 Old myocardial infarction: Secondary | ICD-10-CM | POA: Diagnosis not present

## 2014-11-29 DIAGNOSIS — Z87891 Personal history of nicotine dependence: Secondary | ICD-10-CM | POA: Diagnosis not present

## 2014-11-29 DIAGNOSIS — J8410 Pulmonary fibrosis, unspecified: Secondary | ICD-10-CM | POA: Diagnosis present

## 2014-11-29 DIAGNOSIS — Z6820 Body mass index (BMI) 20.0-20.9, adult: Secondary | ICD-10-CM | POA: Diagnosis not present

## 2014-11-29 DIAGNOSIS — I509 Heart failure, unspecified: Secondary | ICD-10-CM

## 2014-11-29 DIAGNOSIS — N179 Acute kidney failure, unspecified: Secondary | ICD-10-CM | POA: Diagnosis present

## 2014-11-29 DIAGNOSIS — R0902 Hypoxemia: Secondary | ICD-10-CM

## 2014-11-29 DIAGNOSIS — D689 Coagulation defect, unspecified: Secondary | ICD-10-CM | POA: Diagnosis present

## 2014-11-29 DIAGNOSIS — I5043 Acute on chronic combined systolic (congestive) and diastolic (congestive) heart failure: Secondary | ICD-10-CM | POA: Diagnosis present

## 2014-11-29 DIAGNOSIS — I2510 Atherosclerotic heart disease of native coronary artery without angina pectoris: Secondary | ICD-10-CM | POA: Diagnosis present

## 2014-11-29 DIAGNOSIS — F0280 Dementia in other diseases classified elsewhere without behavioral disturbance: Secondary | ICD-10-CM | POA: Diagnosis present

## 2014-11-29 DIAGNOSIS — E785 Hyperlipidemia, unspecified: Secondary | ICD-10-CM | POA: Diagnosis present

## 2014-11-29 DIAGNOSIS — I5031 Acute diastolic (congestive) heart failure: Secondary | ICD-10-CM

## 2014-11-29 DIAGNOSIS — N183 Chronic kidney disease, stage 3 (moderate): Secondary | ICD-10-CM | POA: Diagnosis present

## 2014-11-29 DIAGNOSIS — J9601 Acute respiratory failure with hypoxia: Principal | ICD-10-CM

## 2014-11-29 DIAGNOSIS — IMO0002 Reserved for concepts with insufficient information to code with codable children: Secondary | ICD-10-CM

## 2014-11-29 DIAGNOSIS — R0602 Shortness of breath: Secondary | ICD-10-CM | POA: Diagnosis present

## 2014-11-29 DIAGNOSIS — H9190 Unspecified hearing loss, unspecified ear: Secondary | ICD-10-CM | POA: Diagnosis present

## 2014-11-29 DIAGNOSIS — I482 Chronic atrial fibrillation, unspecified: Secondary | ICD-10-CM | POA: Diagnosis present

## 2014-11-29 DIAGNOSIS — I4581 Long QT syndrome: Secondary | ICD-10-CM | POA: Diagnosis present

## 2014-11-29 DIAGNOSIS — I69320 Aphasia following cerebral infarction: Secondary | ICD-10-CM

## 2014-11-29 DIAGNOSIS — K219 Gastro-esophageal reflux disease without esophagitis: Secondary | ICD-10-CM | POA: Diagnosis present

## 2014-11-29 DIAGNOSIS — N182 Chronic kidney disease, stage 2 (mild): Secondary | ICD-10-CM | POA: Diagnosis present

## 2014-11-29 DIAGNOSIS — Z8546 Personal history of malignant neoplasm of prostate: Secondary | ICD-10-CM | POA: Diagnosis not present

## 2014-11-29 DIAGNOSIS — R7989 Other specified abnormal findings of blood chemistry: Secondary | ICD-10-CM

## 2014-11-29 DIAGNOSIS — I5042 Chronic combined systolic (congestive) and diastolic (congestive) heart failure: Secondary | ICD-10-CM | POA: Insufficient documentation

## 2014-11-29 DIAGNOSIS — E780 Pure hypercholesterolemia: Secondary | ICD-10-CM | POA: Diagnosis present

## 2014-11-29 DIAGNOSIS — D649 Anemia, unspecified: Secondary | ICD-10-CM | POA: Diagnosis present

## 2014-11-29 DIAGNOSIS — I480 Paroxysmal atrial fibrillation: Secondary | ICD-10-CM | POA: Diagnosis present

## 2014-11-29 DIAGNOSIS — I4891 Unspecified atrial fibrillation: Secondary | ICD-10-CM | POA: Diagnosis not present

## 2014-11-29 DIAGNOSIS — R634 Abnormal weight loss: Secondary | ICD-10-CM | POA: Diagnosis present

## 2014-11-29 DIAGNOSIS — Z955 Presence of coronary angioplasty implant and graft: Secondary | ICD-10-CM

## 2014-11-29 DIAGNOSIS — J9621 Acute and chronic respiratory failure with hypoxia: Secondary | ICD-10-CM | POA: Diagnosis present

## 2014-11-29 DIAGNOSIS — Z66 Do not resuscitate: Secondary | ICD-10-CM | POA: Diagnosis present

## 2014-11-29 DIAGNOSIS — J189 Pneumonia, unspecified organism: Secondary | ICD-10-CM

## 2014-11-29 DIAGNOSIS — I129 Hypertensive chronic kidney disease with stage 1 through stage 4 chronic kidney disease, or unspecified chronic kidney disease: Secondary | ICD-10-CM | POA: Diagnosis present

## 2014-11-29 DIAGNOSIS — I10 Essential (primary) hypertension: Secondary | ICD-10-CM | POA: Diagnosis present

## 2014-11-29 HISTORY — DX: Reserved for inherently not codable concepts without codable children: IMO0001

## 2014-11-29 LAB — CBC
HCT: 38.7 % — ABNORMAL LOW (ref 39.0–52.0)
HEMOGLOBIN: 12.7 g/dL — AB (ref 13.0–17.0)
MCH: 29.1 pg (ref 26.0–34.0)
MCHC: 32.8 g/dL (ref 30.0–36.0)
MCV: 88.6 fL (ref 78.0–100.0)
PLATELETS: 377 10*3/uL (ref 150–400)
RBC: 4.37 MIL/uL (ref 4.22–5.81)
RDW: 14.7 % (ref 11.5–15.5)
WBC: 10.4 10*3/uL (ref 4.0–10.5)

## 2014-11-29 LAB — BASIC METABOLIC PANEL
Anion gap: 10 (ref 5–15)
BUN: 18 mg/dL (ref 6–23)
CALCIUM: 8.8 mg/dL (ref 8.4–10.5)
CHLORIDE: 104 mmol/L (ref 96–112)
CO2: 24 mmol/L (ref 19–32)
CREATININE: 1.57 mg/dL — AB (ref 0.50–1.35)
GFR, EST AFRICAN AMERICAN: 47 mL/min — AB (ref >90)
GFR, EST NON AFRICAN AMERICAN: 40 mL/min — AB (ref >90)
GLUCOSE: 121 mg/dL — AB (ref 70–99)
Potassium: 3.8 mmol/L (ref 3.5–5.1)
SODIUM: 138 mmol/L (ref 135–145)

## 2014-11-29 LAB — PROTIME-INR
INR: 3.17 — ABNORMAL HIGH (ref 0.00–1.49)
PROTHROMBIN TIME: 32.8 s — AB (ref 11.6–15.2)

## 2014-11-29 LAB — I-STAT TROPONIN, ED: Troponin i, poc: 0.01 ng/mL (ref 0.00–0.08)

## 2014-11-29 LAB — APTT: APTT: 57 s — AB (ref 24–37)

## 2014-11-29 LAB — BRAIN NATRIURETIC PEPTIDE: B NATRIURETIC PEPTIDE 5: 1513 pg/mL — AB (ref 0.0–100.0)

## 2014-11-29 LAB — PROCALCITONIN: Procalcitonin: <0.1 ng/mL

## 2014-11-29 LAB — TSH: TSH: 2.151 u[IU]/mL (ref 0.350–4.500)

## 2014-11-29 MED ORDER — FUROSEMIDE 20 MG PO TABS
40.0000 mg | ORAL_TABLET | Freq: Once | ORAL | Status: AC
  Administered 2014-11-29: 40 mg via ORAL
  Filled 2014-11-29: qty 2

## 2014-11-29 MED ORDER — SODIUM CHLORIDE 0.9 % IJ SOLN
3.0000 mL | Freq: Two times a day (BID) | INTRAMUSCULAR | Status: DC
  Administered 2014-11-29 – 2014-12-04 (×11): 3 mL via INTRAVENOUS

## 2014-11-29 MED ORDER — ALBUTEROL SULFATE (2.5 MG/3ML) 0.083% IN NEBU: 2.5000 mg | INHALATION_SOLUTION | Freq: Four times a day (QID) | RESPIRATORY_TRACT | Status: DC | PRN

## 2014-11-29 MED ORDER — ENSURE ENLIVE PO LIQD
237.0000 mL | Freq: Two times a day (BID) | ORAL | Status: DC
  Administered 2014-11-29 – 2014-12-04 (×10): 237 mL via ORAL

## 2014-11-29 MED ORDER — DEXTROSE 5 % IV SOLN
1.0000 g | Freq: Once | INTRAVENOUS | Status: AC
  Administered 2014-11-29: 12:00:00 via INTRAVENOUS
  Filled 2014-11-29: qty 10

## 2014-11-29 MED ORDER — ACETAMINOPHEN 325 MG PO TABS: 650.0000 mg | ORAL_TABLET | ORAL | Status: DC | PRN

## 2014-11-29 MED ORDER — TRAMADOL HCL 50 MG PO TABS: 50.0000 mg | ORAL_TABLET | Freq: Two times a day (BID) | ORAL | Status: DC | PRN

## 2014-11-29 MED ORDER — FUROSEMIDE 10 MG/ML IJ SOLN
20.0000 mg | Freq: Two times a day (BID) | INTRAMUSCULAR | Status: DC
  Administered 2014-11-29: 20 mg via INTRAVENOUS
  Filled 2014-11-29 (×2): qty 2

## 2014-11-29 MED ORDER — OXYMETAZOLINE HCL 0.05 % NA SOLN
1.0000 | Freq: Two times a day (BID) | NASAL | Status: DC | PRN
  Filled 2014-11-29: qty 15

## 2014-11-29 MED ORDER — PRAVASTATIN SODIUM 40 MG PO TABS
40.0000 mg | ORAL_TABLET | Freq: Every evening | ORAL | Status: DC
  Administered 2014-11-29 – 2014-12-03 (×5): 40 mg via ORAL
  Filled 2014-11-29 (×7): qty 1

## 2014-11-29 MED ORDER — ASPIRIN EC 81 MG PO TBEC
81.0000 mg | DELAYED_RELEASE_TABLET | Freq: Every day | ORAL | Status: DC
  Administered 2014-11-29 – 2014-12-04 (×6): 81 mg via ORAL
  Filled 2014-11-29 (×7): qty 1

## 2014-11-29 MED ORDER — WARFARIN - PHARMACIST DOSING INPATIENT
Freq: Every day | Status: DC
  Administered 2014-12-01: 17:00:00

## 2014-11-29 MED ORDER — PANTOPRAZOLE SODIUM 40 MG PO TBEC
40.0000 mg | DELAYED_RELEASE_TABLET | Freq: Every day | ORAL | Status: DC
  Administered 2014-11-29 – 2014-12-04 (×6): 40 mg via ORAL
  Filled 2014-11-29 (×6): qty 1

## 2014-11-29 MED ORDER — SOTALOL HCL 120 MG PO TABS
120.0000 mg | ORAL_TABLET | Freq: Two times a day (BID) | ORAL | Status: DC
  Administered 2014-11-29: 120 mg via ORAL
  Filled 2014-11-29 (×5): qty 1

## 2014-11-29 MED ORDER — NITROGLYCERIN 0.4 MG SL SUBL
0.4000 mg | SUBLINGUAL_TABLET | SUBLINGUAL | Status: DC | PRN
Start: 2014-11-29 — End: 2014-12-04

## 2014-11-29 MED ORDER — BUPROPION HCL ER (SR) 150 MG PO TB12
150.0000 mg | ORAL_TABLET | Freq: Two times a day (BID) | ORAL | Status: DC
  Administered 2014-11-29 – 2014-12-04 (×11): 150 mg via ORAL
  Filled 2014-11-29 (×15): qty 1

## 2014-11-29 MED ORDER — SERTRALINE HCL 100 MG PO TABS
100.0000 mg | ORAL_TABLET | Freq: Every evening | ORAL | Status: DC
Start: 2014-11-29 — End: 2014-12-04
  Administered 2014-11-29 – 2014-12-03 (×5): 100 mg via ORAL
  Filled 2014-11-29 (×6): qty 1

## 2014-11-29 MED ORDER — DEXTROSE 5 % IV SOLN
500.0000 mg | INTRAVENOUS | Status: DC
  Administered 2014-11-29 – 2014-11-30 (×2): 500 mg via INTRAVENOUS
  Filled 2014-11-29 (×3): qty 500

## 2014-11-29 MED ORDER — SODIUM CHLORIDE 0.9 % IV SOLN: 250.0000 mL | INTRAVENOUS | Status: DC | PRN

## 2014-11-29 MED ORDER — SODIUM CHLORIDE 0.9 % IJ SOLN: 3.0000 mL | INTRAMUSCULAR | Status: DC | PRN

## 2014-11-29 MED ORDER — METOPROLOL SUCCINATE ER 25 MG PO TB24
25.0000 mg | ORAL_TABLET | Freq: Every day | ORAL | Status: DC
  Filled 2014-11-29 (×4): qty 1

## 2014-11-29 MED ORDER — ONDANSETRON HCL 4 MG/2ML IJ SOLN
4.0000 mg | Freq: Four times a day (QID) | INTRAMUSCULAR | Status: DC | PRN
Start: 2014-11-29 — End: 2014-12-02

## 2014-11-29 MED ORDER — ALPRAZOLAM 0.5 MG PO TABS
0.5000 mg | ORAL_TABLET | Freq: Every day | ORAL | Status: DC
  Administered 2014-11-29 – 2014-12-03 (×5): 0.5 mg via ORAL
  Filled 2014-11-29 (×5): qty 1

## 2014-11-29 NOTE — Progress Notes (Signed)
Initial Nutrition Assessment  DOCUMENTATION CODES:  Severe malnutrition in context of social or environmental circumstances  INTERVENTION:  Ensure Enlive (each supplement provides 350kcal and 20 grams of protein), Snacks  NUTRITION DIAGNOSIS:  Inadequate oral intake related to social / environmental circumstances as evidenced by percent weight loss.   GOAL:  Patient will meet greater than or equal to 90% of their needs   MONITOR:  PO intake, Weight trends, I & O's, Labs  REASON FOR ASSESSMENT:  Consult Assessment of nutrition requirement/status  ASSESSMENT: 81 y.o. malewith past medical history of atrial fibrillation on chronic Coumadin, mild dementia following a stroke several years ago and hypertension who has been reporting several weeks of slowly worsening shortness of breath with exertion to the point where patient felt like he could not breathe at all today and came into the emergency room.   Pt reports that he used to weight 180 lbs a few years ago and has been losing weight despite having a good appetite. He denies any issues with chewing, swallowing, nausea, constipation/diarrhe. He reports eating a biscuit for breakfast, a sandwich for lunch, and a salad or can of soup for dinner some days; most days he only eats twice. He tried drinking Ensure supplements for a while and like them but, found them too expensive. Pt's son at bedside reports that pt has given up since his wife passed away 5 years ago; pt skips meals, declines eating dinner with family when offered, and eats half of most meals observed by son. Weight history shows pt has lost 12% of his body weight in the past year. Pt with moderate fat and muscle wasting per physical exam. RD emphasized the importance of maintaining weight and getting adequate nutrition. Encouraged snacking frequently throughout the days and using nutritional shakes to supplement meals. Pt agreeable to receiving Ensure and snacks while  admitted.   Labs and medications reviewed.   Height:  Ht Readings from Last 1 Encounters:  11/29/14 5\' 10"  (1.778 m)    Weight:  Wt Readings from Last 1 Encounters:  11/29/14 144 lb 6.4 oz (65.5 kg)    Ideal Body Weight:  75.5 kg  Wt Readings from Last 10 Encounters:  11/29/14 144 lb 6.4 oz (65.5 kg)  09/02/14 147 lb (66.679 kg)  05/31/14 151 lb 12.8 oz (68.856 kg)  12/18/13 163 lb (73.936 kg)  10/20/12 180 lb 12.4 oz (82 kg)  10/19/12 185 lb (83.915 kg)  07/15/11 185 lb (83.915 kg)    BMI:  Body mass index is 20.72 kg/(m^2).  Estimated Nutritional Needs:  Kcal:  1900-2100  Protein:  75-85 grams  Fluid:  >/= 1.9 L/day  Skin:   intact  Diet Order:  Diet Heart Room service appropriate?: Yes; Fluid consistency:: Thin  EDUCATION NEEDS:  No education needs identified at this time  No intake or output data in the 24 hours ending 11/29/14 1535  Last BM:  4/28   Pryor Ochoa RD, LDN Inpatient Clinical Dietitian Pager: [phone] After Hours Pager: [phone]

## 2014-11-29 NOTE — Progress Notes (Signed)
Pt on nebulizer from EMS. Will continue that at this point then switch to [place]. Pt with bilateral expiratory wheezes throughout, fine crackles in the bases. Pt spo2 100% on 8L aerosol mask at this time. Pt states he feels much better and denies sob at this time.

## 2014-11-29 NOTE — ED Notes (Signed)
EDP at bedside to assess patient.

## 2014-11-29 NOTE — Progress Notes (Signed)
Pt O2 decreased to 2L [place]. SPO2 96%. RT will continue to monitor.

## 2014-11-29 NOTE — Progress Notes (Signed)
ANTICOAGULATION CONSULT NOTE - Initial Consult  Pharmacy Consult for coumadin Indication: atrial fibrillation  No Known Allergies  Vital Signs: Temp: 97.5 F (36.4 C) (04/29 1019) Temp Source: Oral (04/29 1019) BP: 141/65 mmHg (04/29 1235) Pulse Rate: 49 (04/29 1235)  Labs:  Recent Labs  11/29/14 1053  HGB 12.7*  HCT 38.7*  PLT 377  APTT 57*  LABPROT 32.8*  INR 3.17*  CREATININE 1.57*    CrCl cannot be calculated (Unknown ideal weight.).   Medical History: Past Medical History  Diagnosis Date  . Hypertension   . Atrial fibrillation   . Hypercholesteremia   . MI (myocardial infarction)     x 2  . Angina   . Cancer     prostate cancer  . Coronary artery disease     Circ stent (2009 cath patent)  . Prostate cancer   . Hyperlipidemia   . MI (myocardial infarction)   . TIA (transient ischemic attack)   . GERD (gastroesophageal reflux disease)     Medications:   (Not in a hospital admission)  Assessment: 81 yo man to continue coumadin for afib.  His INR today is 3.17.  Last dose of coumadin reported as 4/28.   Goal of Therapy:  INR 2-3 Monitor platelets by anticoagulation protocol: Yes   Plan:  Will hold coumadin today F/u daily PT/INR Monitor for bleeding complications  Thanks for allowing pharmacy to be a part of this patient's care.  Excell Seltzer, PharmD Clinical Pharmacist, [phone] 11/29/2014,2:24 PM

## 2014-11-29 NOTE — ED Notes (Signed)
Per EMS- pt is coming from home reports feeling sick for 1 week with cough and orthopnea. Initial oxygen with fire dept low 80's on RA with diminished lung sounds. Started on oxygen with fire, EMS reports initial lung sounds wheezing. Given total 15 mg albuterol, 0.5 atrovent, 125 solumedrol, 2 G Mag, 1 mg  Epi nebulized, 4 mg zofran due to vomiting after mag. 94% on neb treatment, oxygen sats fall to 80's when oxygen removed. BP 152/85, HR 60 SR. Pt is a x 4. Speaking in full sentences. Denies pain.

## 2014-11-29 NOTE — ED Notes (Signed)
Pt states he has been sick x1 week, reports productive cough with yellow sputum, also states progressive shortness of breath. Pt speaking complete sentences upon arrival, able to follow commands. Respirations unlabored. Pt is alert and oriented x4.

## 2014-11-29 NOTE — Progress Notes (Signed)
  Echocardiogram 2D Echocardiogram has been performed.  Diamond Nickel 11/29/2014, 4:28 PM

## 2014-11-29 NOTE — H&P (Signed)
History and Physical  Logan Day YIR:[id number] DOB: 03/18/1933 DOA: 11/29/2014  Referring physician: Varney Biles, ER physician PCP: Dorian Heckle, MD   Chief Complaint: Can't breathe  HPI: Logan Day is a 81 y.o. male  With past medical history of atrial fibrillation on chronic Coumadin, mild dementia following a stroke several years ago and hypertension who has been reporting several weeks of slowly worsening shortness of breath with exertion to the point where patient felt like he could not breathe at all today and came into the emergency room. In the emergency room, chest x-ray looked more like pulmonary edema and BNP was elevated at 1519. Patient has no previous history of CHF. He is also noted to be hypoxic with oxygen saturations in the 80s on room air, which improved to 96% on 2 L following breathing treatments. Patient's labs noted a normal white count, hemoglobin. His INR was slightly supratherapeutic at 3.17. His creatinine was noted at 1.57 with a normal BUN, slightly elevated when comparison to several years ago. In discussion with the patient's son at the bedside, he also had approximately 30 pound weight loss in the last 6 months unintentionally. Hospitalists were called for further evaluation and admission.   Review of Systems:  Patient seen in the emergency room Patient is quite hard of hearing, so it was difficult to get a review of systems. Pt complains of shortness of breath, although improved. He denies any pain. He denies any trouble swallowing. Does complain of weakness.  Review of systems are otherwise negative  Past Medical History  Diagnosis Date  . Hypertension   . Atrial fibrillation   . Hypercholesteremia   . MI (myocardial infarction)     x 2  . Angina   . Cancer     prostate cancer  . Coronary artery disease     Circ stent (2009 cath patent)  . Prostate cancer   . Hyperlipidemia   . MI (myocardial infarction)   . TIA (transient  ischemic attack)   . GERD (gastroesophageal reflux disease)    Past Surgical History  Procedure Laterality Date  . Prostayectomy    . Fracture surgery    . Coronary angioplasty with stent placement  10/19/2012    DES  to circumflex  . Percutaneous coronary stent intervention (pci-s) N/A 10/19/2012    Procedure: PERCUTANEOUS CORONARY STENT INTERVENTION (PCI-S);  Surgeon: Sinclair Grooms, MD;  Location: [facility] CATH LAB;  Service: Cardiovascular;  Laterality: N/A;   Social History:  reports that he has quit smoking. His smokeless tobacco use includes Chew. He reports that he does not drink alcohol or use illicit drugs. Patient lives at home by himself with family nearby & is able to participate in activities of daily living with out assistance, although his son reports that he gets very easily winded even with short walks at baseline  No Known Allergies  Family History  Problem Relation Age of Onset  . Coronary artery disease Son   . Heart disease Mother   . Heart disease Father     Confirmed with son  Prior to Admission medications   Medication Sig Start Date End Date Taking? Authorizing Provider  ALPRAZolam Duanne Moron) 0.5 MG tablet Take 1 tablet by mouth at bedtime. 04/09/14  Yes Historical Provider, MD  aspirin EC 81 MG tablet Take 1 tablet (81 mg total) by mouth daily. 12/18/13  Yes Belva Crome, MD  buPROPion [facility] SR) 150 MG 12 hr tablet Take 150 mg by mouth  2 (two) times daily.  08/01/14  Yes Historical Provider, MD  metoprolol succinate (TOPROL XL) 25 MG 24 hr tablet Take 1 tablet (25 mg total) by mouth daily. 01/14/14  Yes Belva Crome, MD  Multiple Vitamin (MULTIVITAMIN WITH MINERALS) TABS tablet Take 1 tablet by mouth every evening.   Yes Historical Provider, MD  omeprazole (PRILOSEC) 20 MG capsule Take 20 mg by mouth every morning.   Yes Historical Provider, MD  oxymetazoline (AFRIN) 0.05 % nasal spray Place 1 spray into both nostrils 2 (two) times daily as needed for  congestion.   Yes Historical Provider, MD  pravastatin (PRAVACHOL) 40 MG tablet Take 40 mg by mouth every evening.   Yes Historical Provider, MD  sertraline (ZOLOFT) 100 MG tablet Take 100 mg by mouth every evening.   Yes Historical Provider, MD  sotalol (BETAPACE) 120 MG tablet Take 1 tablet (120 mg total) by mouth 2 (two) times daily. 11/22/14  Yes Belva Crome, MD  traMADol (ULTRAM) 50 MG tablet Take 50 mg by mouth 2 (two) times daily as needed for pain.   Yes Historical Provider, MD  warfarin (COUMADIN) 4 MG tablet Take 2-4 mg by mouth every evening. Take half of a tablet (2mg ) on Tuesdays & Fridays. On all other days take a whole tablet (4mg ).   Yes Historical Provider, MD  nitroGLYCERIN (NITROSTAT) 0.4 MG SL tablet Place 0.4 mg under the tongue every 5 (five) minutes as needed for chest pain.    Historical Provider, MD    Physical Exam: BP 141/65 mmHg  Pulse 49  Temp(Src) 97.5 F (36.4 C) (Oral)  Resp 18  SpO2 94%  General:  Alert and oriented 2, no acute distress, patient looks slightly emaciated Eyes: Sclera nonicteric Dr. movements are intact ENT: Normocephalic, atraumatic, mucous murmurs are slightly dry Neck: No JVD Cardiovascular: Irregular rhythm, rate controlled Respiratory: Decreased breath sounds throughout. Mild end expiratory wheeze Abdomen: Soft, nontender, nondistended, positive bowel sounds Skin: No skin breaks, tears or lesions Musculoskeletal: No clubbing or cyanosis, trace edema bilaterally Psychiatric: Patient is slightly hard of hearing, does have some very mild confusion-which son clarifies he is at his baseline, but he is otherwise appropriate, no evidence of psychoses Neurologic: No focal deficits           Labs on Admission:  Basic Metabolic Panel:  Recent Labs Lab 11/29/14 1053  NA 138  K 3.8  CL 104  CO2 24  GLUCOSE 121*  BUN 18  CREATININE 1.57*  CALCIUM 8.8   Liver Function Tests: No results for input(s): AST, ALT, ALKPHOS, BILITOT,  PROT, ALBUMIN in the last 168 hours. No results for input(s): LIPASE, AMYLASE in the last 168 hours. No results for input(s): AMMONIA in the last 168 hours. CBC:  Recent Labs Lab 11/29/14 1053  WBC 10.4  HGB 12.7*  HCT 38.7*  MCV 88.6  PLT 377   Cardiac Enzymes: No results for input(s): CKTOTAL, CKMB, CKMBINDEX, TROPONINI in the last 168 hours.  BNP (last 3 results)  Recent Labs  11/29/14 1053  BNP 1513.0*    ProBNP (last 3 results) No results for input(s): PROBNP in the last 8760 hours.  CBG: No results for input(s): GLUCAP in the last 168 hours.  Radiological Exams on Admission: Dg Chest Portable 1 View  11/29/2014   CLINICAL DATA:  Shortness of breath.  Weakness.  EXAM: PORTABLE CHEST - 1 VIEW  COMPARISON:  12/28/2013  FINDINGS: Diffuse abnormal increased interstitial accentuation superimposed on emphysema. The interstitial  accentuation is coarse with patchy ground-glass opacities throughout both lungs.  Heart size within normal limits. Atherosclerotic calcification in the wall of the aortic arch. Faint airspace opacities at the lung bases.  IMPRESSION: 1. Bilateral abnormal interstitial accentuation, airway thickening, and indistinct airspace opacities at the lung bases. The appearance favors acute pulmonary edema. Atypical pneumonia is a differential diagnostic consideration. 2. Emphysema.   Electronically Signed   By: Van Clines M.D.   On: 11/29/2014 10:46    EKG: Independently reviewed. Formally read as junctional rhythm, anterior infarct, LVH, prolonged QT-unchanged from previous. However looks more like atrial fibrillation with a regular rate  Assessment/Plan Present on Admission:  . Acute respiratory failure with hypoxia felt to be secondary to acute diastolic heart failure: Have started IV Lasix. Checking echocardiogram. One was done in December 2012 which noted preserved ejection fraction and unable to comment on diastolic function due to atrial  fibrillation. Daily weights and strict input/output. Already on beta blocker which will continue. Holding ACE inhibitor until sure of his baseline renal function.  [name] acute kidney injury in the setting of CKD (chronic kidney disease) stage 2, GFR 60-89 ml/min: Patient's creatinine in March 2014 was at 1.38 and now 1.57. Monitor how it does in response to Lasix. This could be his new baseline.   . Hyperlipidemia: Stable continue statin  . Hypertension: Stable continue home meds   . Chronic a-fib on Coumadin with coagulopathy: Suspect increase in INR secondary to hepatic congestion from diastolic heart failure. Pharmacy to dose. INR should trend down as he diureses   . 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 Weight loss, unintentional: Check TSH. Perhaps recheck chest x-ray once patient is fully diuresed to ensure no lung nodule. Patient does have a history of smoking but quit several decades ago. Slightly concerning for malignancy. Have also asked nutrition to see as well. Suspect protein calorie malnutrition. Patient states that he does have good appetite  Consultants: None  Code Status: Son confirms DO NOT RESUSCITATE  Family Communication: Patient's son, who is a Teaching laboratory technician, at the bedside   Disposition Plan: Here for several days until respiratory status back to baseline  Time spent: 65 minutes  [facility] Pager [phone]

## 2014-11-29 NOTE — Progress Notes (Signed)
Pt placed on 4 L [place]  

## 2014-11-29 NOTE — ED Provider Notes (Addendum)
CSN: [id number]     Arrival date & time 11/29/14  1011 History   First MD Initiated Contact with Patient 11/29/14 1019     Chief Complaint  Patient presents with  . Shortness of Breath     (Consider location/radiation/quality/duration/timing/severity/associated sxs/prior Treatment) HPI Comments: PT comes in via EMS with cc of dib. Pt has hx of CVA, HTN, CAD, Afib on coumadin. Pt reports that he has been feeling easily winded the last 2 weeks - but his sx have gotten progressively worse the last 2 days. Today, his son noted pt is extremus, and called EMS. Per EMS, pt was in acute respiratory distress, and had diffuse wheezing. He was given epi, albuterol 15 mg, ipratropium, solumedrol prior to ER arrival. Pt is feeling better currently compared to before.\ There is no chest pain, leg swelling. No hx of PE, DVT.   ROS 10 Systems reviewed and are negative for acute change except as noted in the HPI.     Patient is a 81 y.o. male presenting with shortness of breath. The history is provided by the patient.  Shortness of Breath Associated symptoms: cough and wheezing   Associated symptoms: no chest pain     Past Medical History  Diagnosis Date  . Hypertension   . Atrial fibrillation   . Hypercholesteremia   . MI (myocardial infarction)     x 2  . Angina   . Cancer     prostate cancer  . Coronary artery disease     Circ stent (2009 cath patent)  . Prostate cancer   . Hyperlipidemia   . MI (myocardial infarction)   . TIA (transient ischemic attack)   . GERD (gastroesophageal reflux disease)    Past Surgical History  Procedure Laterality Date  . Prostayectomy    . Fracture surgery    . Coronary angioplasty with stent placement  10/19/2012    DES  to circumflex  . Percutaneous coronary stent intervention (pci-s) N/A 10/19/2012    Procedure: PERCUTANEOUS CORONARY STENT INTERVENTION (PCI-S);  Surgeon: Sinclair Grooms, MD;  Location: [facility] CATH LAB;  Service: Cardiovascular;   Laterality: N/A;   Family History  Problem Relation Age of Onset  . Coronary artery disease Son   . Heart disease Mother   . Heart disease Father    History  Substance Use Topics  . Smoking status: Former Smoker -- 1.00 packs/day for 40 years  . Smokeless tobacco: Current User    Types: Chew     Comment: quit smoking 2002  . Alcohol Use: No    Review of Systems  Constitutional: Positive for fatigue.  Respiratory: Positive for cough, shortness of breath and wheezing.   Cardiovascular: Negative for chest pain.  Neurological: Positive for weakness.  All other systems reviewed and are negative.     Allergies  Review of patient's allergies indicates no known allergies.  Home Medications   Prior to Admission medications   Medication Sig Start Date End Date Taking? Authorizing Provider  ALPRAZolam Duanne Moron) 0.5 MG tablet Take 1 tablet by mouth at bedtime. 04/09/14  Yes Historical Provider, MD  aspirin EC 81 MG tablet Take 1 tablet (81 mg total) by mouth daily. 12/18/13  Yes Belva Crome, MD  buPROPion [facility] SR) 150 MG 12 hr tablet Take 150 mg by mouth 2 (two) times daily.  08/01/14  Yes Historical Provider, MD  metoprolol succinate (TOPROL XL) 25 MG 24 hr tablet Take 1 tablet (25 mg total) by mouth daily. 01/14/14  Yes Belva Crome, MD  Multiple Vitamin (MULTIVITAMIN WITH MINERALS) TABS tablet Take 1 tablet by mouth every evening.   Yes Historical Provider, MD  omeprazole (PRILOSEC) 20 MG capsule Take 20 mg by mouth every morning.   Yes Historical Provider, MD  oxymetazoline (AFRIN) 0.05 % nasal spray Place 1 spray into both nostrils 2 (two) times daily as needed for congestion.   Yes Historical Provider, MD  pravastatin (PRAVACHOL) 40 MG tablet Take 40 mg by mouth every evening.   Yes Historical Provider, MD  sertraline (ZOLOFT) 100 MG tablet Take 100 mg by mouth every evening.   Yes Historical Provider, MD  sotalol (BETAPACE) 120 MG tablet TAKE ONE TABLET BY MOUTH TWICE DAILY  09/16/14  Yes Lendon Colonel, NP  sotalol (BETAPACE) 120 MG tablet Take 1 tablet (120 mg total) by mouth 2 (two) times daily. 11/22/14  Yes Belva Crome, MD  traMADol (ULTRAM) 50 MG tablet Take 50 mg by mouth 2 (two) times daily as needed for pain.   Yes Historical Provider, MD  warfarin (COUMADIN) 4 MG tablet Take 2-4 mg by mouth every evening. Take half of a tablet (2mg ) on Tuesdays & Fridays. On all other days take a whole tablet (4mg ).   Yes Historical Provider, MD  nitroGLYCERIN (NITROSTAT) 0.4 MG SL tablet Place 0.4 mg under the tongue every 5 (five) minutes as needed for chest pain.    Historical Provider, MD   BP 134/69 mmHg  Pulse 53  Temp(Src) 97.5 F (36.4 C) (Oral)  Resp 18  SpO2 99% Physical Exam  Constitutional: He is oriented to person, place, and time. He appears well-developed.  HENT:  Head: Normocephalic and atraumatic.  Eyes: Conjunctivae and EOM are normal. Pupils are equal, round, and reactive to light.  Neck: Normal range of motion. Neck supple. No JVD present.  Cardiovascular: Normal rate.   Pulmonary/Chest: He is in respiratory distress. He has wheezes. He has no rales.  Abdominal: Soft. Bowel sounds are normal. He exhibits no distension. There is no tenderness. There is no rebound and no guarding.  Musculoskeletal: He exhibits no edema.  Neurological: He is alert and oriented to person, place, and time.  Skin: Skin is warm.  Nursing note and vitals reviewed.   ED Course  Procedures (including critical care time) Labs Review Labs Reviewed  CBC - Abnormal; Notable for the following:    Hemoglobin 12.7 (*)    HCT 38.7 (*)    All other components within normal limits  BASIC METABOLIC PANEL - Abnormal; Notable for the following:    Glucose, Bld 121 (*)    Creatinine, Ser 1.57 (*)    GFR calc non Af Amer 40 (*)    GFR calc Af Amer 47 (*)    All other components within normal limits  BRAIN NATRIURETIC PEPTIDE - Abnormal; Notable for the following:    B  Natriuretic Peptide 1513.0 (*)    All other components within normal limits  APTT - Abnormal; Notable for the following:    aPTT 57 (*)    All other components within normal limits  PROTIME-INR - Abnormal; Notable for the following:    Prothrombin Time 32.8 (*)    INR 3.17 (*)    All other components within normal limits  I-STAT TROPOININ, ED    Imaging Review Dg Chest Portable 1 View  11/29/2014   CLINICAL DATA:  Shortness of breath.  Weakness.  EXAM: PORTABLE CHEST - 1 VIEW  COMPARISON:  12/28/2013  FINDINGS: Diffuse abnormal increased interstitial accentuation superimposed on emphysema. The interstitial accentuation is coarse with patchy ground-glass opacities throughout both lungs.  Heart size within normal limits. Atherosclerotic calcification in the wall of the aortic arch. Faint airspace opacities at the lung bases.  IMPRESSION: 1. Bilateral abnormal interstitial accentuation, airway thickening, and indistinct airspace opacities at the lung bases. The appearance favors acute pulmonary edema. Atypical pneumonia is a differential diagnostic consideration. 2. Emphysema.   Electronically Signed   By: Van Clines M.D.   On: 11/29/2014 10:46     EKG Interpretation   Date/Time:  Friday November 29 2014 10:15:23 EDT Ventricular Rate:  66 PR Interval:    QRS Duration: 110 QT Interval:  493 QTC Calculation: 517 R Axis:   70 Text Interpretation:  Junctional rhythm LVH with secondary repolarization  abnormality Anterior infarct, old Prolonged QT interval No significant  change since last tracing Confirmed by Kathrynn Humble, MD, Thelma Comp [phone]) on  11/29/2014 10:19:51 AM     @12 :00 - results shows some pulm edema and possible atypical infection. Pt has yellow, greem phlegm, dark - so will tx for CAP. Will admit for further optimization. MDM   Final diagnoses:  CAP (community acquired pneumonia)  Acute pulmonary edema  Acute respiratory failure with hypoxia    Pt comes in with cc of  dib. Clinically, concerns for pneumonia. Could be mew onset COPD or CHF as well. Will continue to monitor closely. Labs and imaging ordered. EKG shows no acute findings.   Varney Biles, MD 11/29/14 [place], MD 11/29/14 1207

## 2014-11-30 ENCOUNTER — Inpatient Hospital Stay (HOSPITAL_COMMUNITY): Payer: Medicare Other

## 2014-11-30 DIAGNOSIS — E440 Moderate protein-calorie malnutrition: Secondary | ICD-10-CM

## 2014-11-30 DIAGNOSIS — Z7901 Long term (current) use of anticoagulants: Secondary | ICD-10-CM

## 2014-11-30 DIAGNOSIS — I480 Paroxysmal atrial fibrillation: Secondary | ICD-10-CM

## 2014-11-30 DIAGNOSIS — N179 Acute kidney failure, unspecified: Secondary | ICD-10-CM

## 2014-11-30 DIAGNOSIS — R634 Abnormal weight loss: Secondary | ICD-10-CM

## 2014-11-30 DIAGNOSIS — H9190 Unspecified hearing loss, unspecified ear: Secondary | ICD-10-CM

## 2014-11-30 LAB — BASIC METABOLIC PANEL
ANION GAP: 9 (ref 5–15)
BUN: 28 mg/dL — ABNORMAL HIGH (ref 6–23)
CALCIUM: 8.5 mg/dL (ref 8.4–10.5)
CHLORIDE: 101 mmol/L (ref 96–112)
CO2: 24 mmol/L (ref 19–32)
CREATININE: 1.71 mg/dL — AB (ref 0.50–1.35)
GFR calc Af Amer: 42 mL/min — ABNORMAL LOW (ref >90)
GFR calc non Af Amer: 36 mL/min — ABNORMAL LOW (ref >90)
Glucose, Bld: 142 mg/dL — ABNORMAL HIGH (ref 70–99)
Potassium: 4.2 mmol/L (ref 3.5–5.1)
Sodium: 134 mmol/L — ABNORMAL LOW (ref 135–145)

## 2014-11-30 LAB — PROTIME-INR
INR: 3.6 — AB (ref 0.00–1.49)
PROTHROMBIN TIME: 36.2 s — AB (ref 11.6–15.2)

## 2014-11-30 MED ORDER — FUROSEMIDE 10 MG/ML IJ SOLN
20.0000 mg | Freq: Two times a day (BID) | INTRAMUSCULAR | Status: DC
  Administered 2014-11-30 – 2014-12-01 (×3): 20 mg via INTRAVENOUS
  Filled 2014-11-30 (×4): qty 2

## 2014-11-30 NOTE — Progress Notes (Addendum)
PROGRESS NOTE  Logan Day HQI:[id number] DOB: 1933-02-19 DOA: 11/29/2014 PCP: Dorian Heckle, MD  HPI/Recap of past 24 hours:  Poor historian due to dementia/expressive aphasia and heard of hearing. Not in acute distress, not oriented to time.  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 CHF? Pneumonitis? Ct chest/echo pending. Continue oxygen supplement. On lasix for now. Lung exam dose has crackles, no edema, looks euvolemic though.  Elevation of cr, new baseline? ua pending, renal dosing meds.  H/o afib, Paroxysmal , rate controlled on metoprolol, on chronic coumadin. Pharmacy to monitor coumadin dosing. On tele.  Bradycardia/QT prolongation, will d/c sotalol, repeat ekg, keep mag>2, k>4, tsh wnl. D/c zithromycin.  H/o htn/cad s/p MI /CVA with expressive aphasia: continue home meds.  FTT/dementia/weight loss: nutrition consult/PT/discharge planning  Code Status: DNR  Family Communication: patient  Disposition Plan: need discharge planning   Consultants:  none  Procedures:  none  Antibiotics:  zithro   Objective: BP 110/52 mmHg  Pulse 49  Temp(Src) 97.2 F (36.2 C) (Oral)  Resp 18  Ht 5\' 10"  (1.778 m)  Wt 64.8 kg (142 lb 13.7 oz)  BMI 20.50 kg/m2  SpO2 90%  Intake/Output Summary (Last 24 hours) at 11/30/14 1222 Last data filed at 11/30/14 0600  Gross per 24 hour  Intake    717 ml  Output    600 ml  Net    117 ml   Filed Weights   11/29/14 1449 11/30/14 0434  Weight: 65.5 kg (144  lb 6.4 oz) 64.8 kg (142 lb 13.7 oz)    Exam:   General:  NAD  Cardiovascular: Irregular  Respiratory: bibasilar crackles  Abdomen: Soft/ND/NT, positive BS  Musculoskeletal: No Edema  Neuro:  Baseline dementia, not oriented to time, expressive aphasia. Hard of hearing.  Data Reviewed: Basic Metabolic Panel:  Recent Labs Lab 11/29/14 1053 11/30/14 0422  NA 138 134*  K 3.8 4.2  CL 104 101  CO2 24 24  GLUCOSE 121* 142*  BUN 18 28*  CREATININE 1.57* 1.71*  CALCIUM 8.8 8.5   Liver Function Tests: No results for input(s): AST, ALT, ALKPHOS, BILITOT, PROT, ALBUMIN in the last 168 hours. No results for input(s): LIPASE, AMYLASE in the last 168 hours. No results for input(s): AMMONIA in the last 168 hours. CBC:  Recent Labs Lab 11/29/14 1053  WBC 10.4  HGB 12.7*  HCT 38.7*  MCV 88.6  PLT 377   Cardiac Enzymes:   No results for input(s): CKTOTAL, CKMB, CKMBINDEX, TROPONINI in the last 168 hours. BNP (last 3 results)  Recent Labs  11/29/14 1053  BNP 1513.0*    ProBNP (last 3 results) No results for input(s): PROBNP in the last 8760 hours.  CBG: No results for input(s): GLUCAP in the last 168 hours.  No results found for this or any previous visit (from the past 240 hour(s)).   Studies: Dg Chest Portable 1 View  11/29/2014   CLINICAL DATA:  Shortness of breath.  Weakness.  EXAM: PORTABLE CHEST - 1 VIEW  COMPARISON:  12/28/2013  FINDINGS: Diffuse abnormal increased interstitial accentuation superimposed on emphysema. The interstitial accentuation is coarse with patchy ground-glass opacities throughout both lungs.  Heart size within normal limits. Atherosclerotic calcification in the wall of the aortic arch. Faint airspace opacities at the lung bases.  IMPRESSION: 1. Bilateral abnormal interstitial accentuation, airway thickening, and indistinct airspace opacities at the lung bases. The appearance favors acute pulmonary edema. Atypical pneumonia is a differential  diagnostic consideration. 2. Emphysema.   Electronically Signed   By: Van Clines M.D.   On: 11/29/2014 10:46    Scheduled Meds: . ALPRAZolam  0.5 mg Oral QHS  . aspirin EC  81 mg Oral Daily  . azithromycin  500 mg Intravenous Q24H  . buPROPion  150 mg Oral BID  . feeding supplement (ENSURE ENLIVE)  237 mL Oral BID PC  . furosemide  20 mg Intravenous BID  . metoprolol succinate  25 mg Oral Daily  . pantoprazole  40 mg Oral Daily  . pravastatin  40 mg Oral QPM  . sertraline  100 mg Oral QPM  . sodium chloride  3 mL Intravenous Q12H  . sotalol  120 mg Oral BID  . Warfarin - Pharmacist Dosing Inpatient   Does not apply q1800    Continuous Infusions:    Time spent: 84mins  Orie Baxendale MD, PhD  [facility] Pager [phone]. If 7PM-7AM, please contact night-coverage at [email or web address], password [facility] 11/30/2014, 12:22 PM  LOS: 1 day

## 2014-11-30 NOTE — Progress Notes (Signed)
ANTICOAGULATION CONSULT NOTE - Initial Consult  Pharmacy Consult for coumadin Indication: atrial fibrillation  No Known Allergies  Vital Signs: Temp: 97.2 F (36.2 C) (04/30 0434) Temp Source: Oral (04/30 0434) BP: 110/52 mmHg (04/30 0434) Pulse Rate: 49 (04/30 0434)  Labs:  Recent Labs  11/29/14 1053 11/30/14 0422  HGB 12.7*  --   HCT 38.7*  --   PLT 377  --   APTT 57*  --   LABPROT 32.8* 36.2*  INR 3.17* 3.60*  CREATININE 1.57* 1.71*    Estimated Creatinine Clearance: 32.1 mL/min (by C-G formula based on Cr of 1.71).   Medical History: Past Medical History  Diagnosis Date  . Hypertension   . Atrial fibrillation   . Hypercholesteremia   . MI (myocardial infarction)     x 2  . Angina   . Cancer     prostate cancer  . Coronary artery disease     Circ stent (2009 cath patent)  . Prostate cancer   . Hyperlipidemia   . MI (myocardial infarction)   . TIA (transient ischemic attack)   . GERD (gastroesophageal reflux disease)   . Shortness of breath dyspnea     Medications:  Prescriptions prior to admission  Medication Sig Dispense Refill Last Dose  . ALPRAZolam (XANAX) 0.5 MG tablet Take 1 tablet by mouth at bedtime.   11/28/2014 at Unknown time  . aspirin EC 81 MG tablet Take 1 tablet (81 mg total) by mouth daily.   11/28/2014 at Unknown time  . buPROPion (WELLBUTRIN SR) 150 MG 12 hr tablet Take 150 mg by mouth 2 (two) times daily.    11/28/2014 at Unknown time  . metoprolol succinate (TOPROL XL) 25 MG 24 hr tablet Take 1 tablet (25 mg total) by mouth daily.   11/28/2014 at 0930  . Multiple Vitamin (MULTIVITAMIN WITH MINERALS) TABS tablet Take 1 tablet by mouth every evening.   11/28/2014 at Unknown time  . omeprazole (PRILOSEC) 20 MG capsule Take 20 mg by mouth every morning.   11/28/2014 at Unknown time  . oxymetazoline (AFRIN) 0.05 % nasal spray Place 1 spray into both nostrils 2 (two) times daily as needed for congestion.   11/28/2014 at Unknown time  .  pravastatin (PRAVACHOL) 40 MG tablet Take 40 mg by mouth every evening.   11/28/2014 at Unknown time  . sertraline (ZOLOFT) 100 MG tablet Take 100 mg by mouth every evening.   11/28/2014 at Unknown time  . sotalol (BETAPACE) 120 MG tablet Take 1 tablet (120 mg total) by mouth 2 (two) times daily. 60 tablet 6 11/28/2014 at 2130  . traMADol (ULTRAM) 50 MG tablet Take 50 mg by mouth 2 (two) times daily as needed for pain.   11/28/2014 at Unknown time  . warfarin (COUMADIN) 4 MG tablet Take 2-4 mg by mouth every evening. Take half of a tablet (2mg ) on Tuesdays & Fridays. On all other days take a whole tablet (4mg ).   11/28/2014 at Unknown time  . nitroGLYCERIN (NITROSTAT) 0.4 MG SL tablet Place 0.4 mg under the tongue every 5 (five) minutes as needed for chest pain.   Never used    Assessment: 81 yo man admitted on 11/29/2014 with worsening SOB to continue coumadin for afib.  INR on admission was SUPRAtherapeutic at 3.17. INR today has continued to trend up to 3.60 despite holding last night's dose. H/H slightly low, plt wnl with no reported significant s/s bleeding.   Home Coumadin dose: 4 mg daily except 2  mg Tues/Fri (last home dose 4/28)  Goal of Therapy:  INR 2-3 Monitor platelets by anticoagulation protocol: Yes   Plan:  Will hold coumadin again today F/u daily PT/INR Monitor for bleeding complications  Thanks for allowing pharmacy to be a part of this patient's care. Ruta Hinds. Velva Harman, PharmD, [place] Clinical Pharmacist - Resident Pager: [phone] Pharmacy: [phone] 11/30/2014 12:07 PM

## 2014-12-01 ENCOUNTER — Inpatient Hospital Stay (HOSPITAL_COMMUNITY): Payer: Medicare Other

## 2014-12-01 DIAGNOSIS — R627 Adult failure to thrive: Secondary | ICD-10-CM

## 2014-12-01 DIAGNOSIS — R748 Abnormal levels of other serum enzymes: Secondary | ICD-10-CM

## 2014-12-01 DIAGNOSIS — I4891 Unspecified atrial fibrillation: Secondary | ICD-10-CM

## 2014-12-01 DIAGNOSIS — I4581 Long QT syndrome: Secondary | ICD-10-CM

## 2014-12-01 LAB — BASIC METABOLIC PANEL
Anion gap: 7 (ref 5–15)
BUN: 36 mg/dL — ABNORMAL HIGH (ref 6–20)
CALCIUM: 8.4 mg/dL — AB (ref 8.9–10.3)
CHLORIDE: 99 mmol/L — AB (ref 101–111)
CO2: 30 mmol/L (ref 22–32)
CREATININE: 1.92 mg/dL — AB (ref 0.61–1.24)
GFR calc non Af Amer: 32 mL/min — ABNORMAL LOW (ref >60)
GFR, EST AFRICAN AMERICAN: 37 mL/min — AB (ref >60)
Glucose, Bld: 90 mg/dL (ref 70–99)
Potassium: 3.3 mmol/L — ABNORMAL LOW (ref 3.5–5.1)
Sodium: 136 mmol/L (ref 135–145)

## 2014-12-01 LAB — MAGNESIUM: Magnesium: 1.9 mg/dL (ref 1.7–2.4)

## 2014-12-01 LAB — URINALYSIS, ROUTINE W REFLEX MICROSCOPIC
BILIRUBIN URINE: NEGATIVE
Glucose, UA: NEGATIVE mg/dL
Ketones, ur: NEGATIVE mg/dL
Leukocytes, UA: NEGATIVE
NITRITE: NEGATIVE
Protein, ur: NEGATIVE mg/dL
SPECIFIC GRAVITY, URINE: 1.011 (ref 1.005–1.030)
Urobilinogen, UA: 0.2 mg/dL (ref 0.0–1.0)
pH: 5 (ref 5.0–8.0)

## 2014-12-01 LAB — CBC
HCT: 36.4 % — ABNORMAL LOW (ref 39.0–52.0)
Hemoglobin: 11.8 g/dL — ABNORMAL LOW (ref 13.0–17.0)
MCH: 28.7 pg (ref 26.0–34.0)
MCHC: 32.4 g/dL (ref 30.0–36.0)
MCV: 88.6 fL (ref 78.0–100.0)
PLATELETS: 348 10*3/uL (ref 150–400)
RBC: 4.11 MIL/uL — ABNORMAL LOW (ref 4.22–5.81)
RDW: 14.6 % (ref 11.5–15.5)
WBC: 12.2 10*3/uL — ABNORMAL HIGH (ref 4.0–10.5)

## 2014-12-01 LAB — BRAIN NATRIURETIC PEPTIDE: B Natriuretic Peptide: 424.9 pg/mL — ABNORMAL HIGH (ref 0.0–100.0)

## 2014-12-01 LAB — HEPATIC FUNCTION PANEL
ALBUMIN: 2.3 g/dL — AB (ref 3.5–5.0)
ALK PHOS: 70 U/L (ref 38–126)
ALT: 21 U/L (ref 17–63)
AST: 28 U/L (ref 15–41)
Bilirubin, Direct: <0.1 mg/dL — ABNORMAL LOW (ref 0.1–0.5)
TOTAL PROTEIN: 6.7 g/dL (ref 6.5–8.1)
Total Bilirubin: 0.3 mg/dL (ref 0.3–1.2)

## 2014-12-01 LAB — PROTIME-INR
INR: 2.29 — ABNORMAL HIGH (ref 0.00–1.49)
PROTHROMBIN TIME: 25.4 s — AB (ref 11.6–15.2)

## 2014-12-01 LAB — URINE MICROSCOPIC-ADD ON

## 2014-12-01 LAB — PROCALCITONIN: Procalcitonin: 0.13 ng/mL

## 2014-12-01 MED ORDER — METOPROLOL TARTRATE 25 MG PO TABS
25.0000 mg | ORAL_TABLET | Freq: Four times a day (QID) | ORAL | Status: DC
  Administered 2014-12-01 – 2014-12-03 (×4): 25 mg via ORAL
  Filled 2014-12-01 (×12): qty 1

## 2014-12-01 MED ORDER — METOPROLOL TARTRATE 12.5 MG HALF TABLET
12.5000 mg | ORAL_TABLET | Freq: Two times a day (BID) | ORAL | Status: AC
  Administered 2014-12-01: 12.5 mg via ORAL
  Filled 2014-12-01: qty 1

## 2014-12-01 MED ORDER — WARFARIN SODIUM 6 MG PO TABS
6.0000 mg | ORAL_TABLET | Freq: Once | ORAL | Status: AC
  Administered 2014-12-01: 6 mg via ORAL
  Filled 2014-12-01: qty 1

## 2014-12-01 MED ORDER — MAGNESIUM SULFATE IN D5W 10-5 MG/ML-% IV SOLN
1.0000 g | Freq: Once | INTRAVENOUS | Status: AC
  Administered 2014-12-01: 1 g via INTRAVENOUS
  Filled 2014-12-01: qty 100

## 2014-12-01 MED ORDER — POTASSIUM CHLORIDE CRYS ER 20 MEQ PO TBCR
40.0000 meq | EXTENDED_RELEASE_TABLET | Freq: Once | ORAL | Status: AC
  Administered 2014-12-01: 40 meq via ORAL
  Filled 2014-12-01: qty 2

## 2014-12-01 MED ORDER — DILTIAZEM HCL 100 MG IV SOLR
5.0000 mg/h | INTRAVENOUS | Status: DC
  Administered 2014-12-01 (×2): 5 mg/h via INTRAVENOUS
  Filled 2014-12-01 (×2): qty 100

## 2014-12-01 NOTE — Consult Note (Signed)
Primary cardiologist: Dr Daneen Schick MD Consulting cardiologist: Dr Carlyle Dolly MD  Clinical Summary Logan Day is a 81 y.o.male hx of CAD with previous stents most recently stent to LCX 10/2012 , PAF on sotalol and coumadin, hx of embolic CVA, HL, HL, prostate CA, admitted with SOB. He is somewhat of a limited historian, he reports ever since his stroke he has had trouble remembering and expressing himself. He reports he was not able to get his sotalol for 2-3 days at home. While off the medication noted increased palpitations and SOB. He restarted his sotalol but never did quite feel well again. Noted some palpitations during that time.   11/29/14 echo: LVEF 40-45% (LVEF 50% by echo in 2012), mild to mod MR, mild RV dysfunction, diastolic function not described BNP 1513, K 3.8, Cr 1.57 (baseline around 1.4), BUN 18, GFR 40, Hgb 12.7, Plt 377, TSH 2.15, INR 3.6, Mg 1.9,  CXR pulm edema CT chest no acute findings EKG afib RVR, LVH, QTc 520   No Known Allergies  Medications Scheduled Medications: . ALPRAZolam  0.5 mg Oral QHS  . aspirin EC  81 mg Oral Daily  . buPROPion  150 mg Oral BID  . feeding supplement (ENSURE ENLIVE)  237 mL Oral BID PC  . furosemide  20 mg Intravenous BID  . metoprolol succinate  25 mg Oral Daily  . pantoprazole  40 mg Oral Daily  . pravastatin  40 mg Oral QPM  . sertraline  100 mg Oral QPM  . sodium chloride  3 mL Intravenous Q12H  . warfarin  6 mg Oral ONCE-1800  . Warfarin - Pharmacist Dosing Inpatient   Does not apply q1800     Infusions: . diltiazem (CARDIZEM) infusion 5 mg/hr (12/01/14 0425)     PRN Medications:  sodium chloride, acetaminophen, albuterol, nitroGLYCERIN, ondansetron (ZOFRAN) IV, oxymetazoline, sodium chloride, traMADol   Past Medical History  Diagnosis Date  . Hypertension   . Atrial fibrillation   . Hypercholesteremia   . MI (myocardial infarction)     x 2  . Angina   . Cancer     prostate cancer  . Coronary  artery disease     Circ stent (2009 cath patent)  . Prostate cancer   . Hyperlipidemia   . MI (myocardial infarction)   . TIA (transient ischemic attack)   . GERD (gastroesophageal reflux disease)   . Shortness of breath dyspnea     Past Surgical History  Procedure Laterality Date  . Prostayectomy    . Fracture surgery    . Coronary angioplasty with stent placement  10/19/2012    DES  to circumflex  . Percutaneous coronary stent intervention (pci-s) N/A 10/19/2012    Procedure: PERCUTANEOUS CORONARY STENT INTERVENTION (PCI-S);  Surgeon: Sinclair Grooms, MD;  Location[facility] C[facility] LAB;  Service: Cardiovascular;  Laterality: N/A;    Family History  Problem Relation Age of Onset  . Coronary artery disease Son   . Heart disease Mother   . Heart disease Father     Social History Logan Day reports that he has quit smoking. His smokeless tobacco use includes Chew. Logan Day reports that he does not drink alcohol.  Review of Systems CONSTITUTIONAL: No weight loss, fever, chills, weakness or fatigue.  HEENT: Eyes: No visual loss, blurred vision, double vision or yellow sclerae. No hearing loss, sneezing, congestion, runny nose or sore throat.  SKIN: No rash or itching.  CARDIOVASCULAR: per HPI RESPIRATORY: No shortness of  breath, cough or sputum.  GASTROINTESTINAL: No anorexia, nausea, vomiting or diarrhea. No abdominal pain or blood.  GENITOURINARY: no polyuria, no dysuria NEUROLOGICAL: No headache, dizziness, syncope, paralysis, ataxia, numbness or tingling in the extremities. No change in bowel or bladder control.  MUSCULOSKELETAL: No muscle, back pain, joint pain or stiffness.  HEMATOLOGIC: No anemia, bleeding or bruising.  LYMPHATICS: No enlarged nodes. No history of splenectomy.  PSYCHIATRIC: No history of depression or anxiety.      Physical Examination Blood pressure 94/52, pulse 102, temperature 97.9 F (36.6 C), temperature source Oral, resp. rate 18, height 5\' 10"   (1.778 m), weight 141 lb 8.6 oz (64.2 kg), SpO2 92 %.  Intake/Output Summary (Last 24 hours) at 12/01/14 1244 Last data filed at 12/01/14 1235  Gross per 24 hour  Intake 1335.75 ml  Output   1975 ml  Net -639.25 ml    HEENT: sclera clear  Cardiovascular: irreg, rate 100, no m/r/g, no JVD, no carotid bruits  Respiratory: CTAB  GI: abdomen soft, NT, ND  MSK: no LE edema  Neuro: no focal deficits     Lab Results  Basic Metabolic Panel:  Recent Labs Lab 11/29/14 1053 11/30/14 0422 12/01/14 0530  NA 138 134* 136  K 3.8 4.2 3.3*  CL 104 101 99*  CO2 24 24 30   GLUCOSE 121* 142* 90  BUN 18 28* 36*  CREATININE 1.57* 1.71* 1.92*  CALCIUM 8.8 8.5 8.4*  MG  --   --  1.9    Liver Function Tests:  Recent Labs Lab 12/01/14 0530  AST 28  ALT 21  ALKPHOS 70  BILITOT 0.3  PROT 6.7  ALBUMIN 2.3*    CBC:  Recent Labs Lab 11/29/14 1053 12/01/14 0530  WBC 10.4 12.2*  HGB 12.7* 11.8*  HCT 38.7* 36.4*  MCV 88.6 88.6  PLT 377 348    Cardiac Enzymes: No results for input(s): CKTOTAL, CKMB, CKMBINDEX, TROPONINI in the last 168 hours.  BNP: Invalid input(s): POCBNP      Recommendations  1. Afib - on sotalol at home. Somewhat of a limited historian but appears he did not take for 2-3 days, and around this time began having palpitations and SOB. He did restart prior to admission but symptoms continued.  - was in SR on admit but later went into afib with RVR - sotalol discontinued 11/30/14 by primary team due to bradycardia and prolonged QTc. From records heart rates low 50s. QTc on EKG 520. Admitted with AKI, Cr now up to 1.92 currently, potentially QTc changes related to his AKI and sotalol. Was also on azithroycin at that time. He has been started on dilt gtt by primary team.  - will not restart sotalol given renal function at this time. Will work toward rate control today, start lopressor 25mg  q 6 hrs with goals to wean dilt gtt and ultimately consolidate to  Toprol XL given his systolic dysfunction. Follow bp's as they are borderline low. Ask EP to see in AM regarding antiarrhythmics.  - continue coumadin  2. Acute on chronic combined diastolic HF - echo LVEF 63-78%, mild RV dysfunction, mod MR - may have developed some fluid overload if off his sotalol he was going into afib with RVR - negative 280 mL since admission, currently on alsix 20mg  IV bid. Cr and BUN are trending up - current weight 141 lbs, admit weight 144 lbs. Weight at last clinic visit 09/2014 147 lbs. Does not appear volume overloaded on exam today. Will stop lasix,  initial volume overload may have been related to afib at home off his sotalol, appears euvolemic now        Carlyle Dolly, M.D.

## 2014-12-01 NOTE — Progress Notes (Signed)
ANTICOAGULATION CONSULT NOTE - Initial Consult  Pharmacy Consult for coumadin Indication: atrial fibrillation  No Known Allergies  Vital Signs: Temp: 97.9 F (36.6 C) (05/01 0621) Temp Source: Oral (05/01 0621) BP: 94/52 mmHg (05/01 0651) Pulse Rate: 102 (05/01 0651)  Labs:  Recent Labs  11/29/14 1053 11/30/14 0422 12/01/14 0530  HGB 12.7*  --  11.8*  HCT 38.7*  --  36.4*  PLT 377  --  348  APTT 57*  --   --   LABPROT 32.8* 36.2* 25.4*  INR 3.17* 3.60* 2.29*  CREATININE 1.57* 1.71* 1.92*    Estimated Creatinine Clearance: 28.3 mL/min (by C-G formula based on Cr of 1.92).   Medical History: Past Medical History  Diagnosis Date  . Hypertension   . Atrial fibrillation   . Hypercholesteremia   . MI (myocardial infarction)     x 2  . Angina   . Cancer     prostate cancer  . Coronary artery disease     Circ stent (2009 cath patent)  . Prostate cancer   . Hyperlipidemia   . MI (myocardial infarction)   . TIA (transient ischemic attack)   . GERD (gastroesophageal reflux disease)   . Shortness of breath dyspnea     Medications:  Prescriptions prior to admission  Medication Sig Dispense Refill Last Dose  . ALPRAZolam (XANAX) 0.5 MG tablet Take 1 tablet by mouth at bedtime.   11/28/2014 at Unknown time  . aspirin EC 81 MG tablet Take 1 tablet (81 mg total) by mouth daily.   11/28/2014 at Unknown time  . buPROPion (WELLBUTRIN SR) 150 MG 12 hr tablet Take 150 mg by mouth 2 (two) times daily.    11/28/2014 at Unknown time  . metoprolol succinate (TOPROL XL) 25 MG 24 hr tablet Take 1 tablet (25 mg total) by mouth daily.   11/28/2014 at 0930  . Multiple Vitamin (MULTIVITAMIN WITH MINERALS) TABS tablet Take 1 tablet by mouth every evening.   11/28/2014 at Unknown time  . omeprazole (PRILOSEC) 20 MG capsule Take 20 mg by mouth every morning.   11/28/2014 at Unknown time  . oxymetazoline (AFRIN) 0.05 % nasal spray Place 1 spray into both nostrils 2 (two) times daily as needed  for congestion.   11/28/2014 at Unknown time  . pravastatin (PRAVACHOL) 40 MG tablet Take 40 mg by mouth every evening.   11/28/2014 at Unknown time  . sertraline (ZOLOFT) 100 MG tablet Take 100 mg by mouth every evening.   11/28/2014 at Unknown time  . sotalol (BETAPACE) 120 MG tablet Take 1 tablet (120 mg total) by mouth 2 (two) times daily. 60 tablet 6 11/28/2014 at 2130  . traMADol (ULTRAM) 50 MG tablet Take 50 mg by mouth 2 (two) times daily as needed for pain.   11/28/2014 at Unknown time  . warfarin (COUMADIN) 4 MG tablet Take 2-4 mg by mouth every evening. Take half of a tablet (2mg ) on Tuesdays & Fridays. On all other days take a whole tablet (4mg ).   11/28/2014 at Unknown time  . nitroGLYCERIN (NITROSTAT) 0.4 MG SL tablet Place 0.4 mg under the tongue every 5 (five) minutes as needed for chest pain.   Never used    Assessment: 81 yo man admitted on 11/29/2014 with worsening SOB to continue coumadin for afib.  INR on admission was SUPRAtherapeutic at 3.17 and increased to 3.60 despite holding Coumadin. INR today has trended down significanlty to 2.29 after holding Coumadin x 2 days. Of note, patient  was also recently started on a feeding supplement containing vitamin K which may have played a role in drop seen. Will need to verify if patient is to continue feeding supplement upon discharge as may require higher doses of Coumadin. H/H trending down, plt wnl with no reported significant s/s bleeding.   Home Coumadin dose: 4 mg daily except 2 mg Tues/Fri (last home dose 4/28)  Goal of Therapy:  INR 2-3 Monitor platelets by anticoagulation protocol: Yes   Plan:  Coumadin 6 mg PO x 1 tonight F/u daily PT/INR Monitor for bleeding complications  Thanks for allowing pharmacy to be a part of this patient's care. Ruta Hinds. Velva Harman, PharmD, [place] Clinical Pharmacist - Resident Pager: [phone] Pharmacy: [phone] 12/01/2014 10:58 AM

## 2014-12-01 NOTE — Evaluation (Signed)
Physical Therapy Evaluation Patient Details Name: Logan Day MRN: [id number] DOB: [birth year] Today's Date: 12/01/2014   History of Present Illness  Acute respiratory failure with hypoxia felt to be secondary to acute diastolic heart failure; poor historian; history of dementia, afib, htn  Clinical Impression   Pt admitted with above diagnosis. Pt currently with functional limitations due to the deficits listed below (see PT Problem List).  Pt will benefit from skilled PT to increase their independence and safety with mobility to allow discharge to the venue listed below.    At this time, Logan Day is unable to meet his basic safety, ADL, and mobility needs independently; for pts with dementia, home is often the most therapeutic place with familiar environment and routine; I favor dc home, provided Logan Day has 24 hour reliable supervision and assist; Can family give 24 hour assist? Can we see what kind of in-home caregiver coverage is available to Logan Day  If 24 hour assist is not available, we must consider ALF for long term -- perhaps a memory care unit       Follow Up Recommendations Home health PT;Supervision/Assistance - 24 hour    Equipment Recommendations  Cane    Recommendations for Other Services OT consult (as ordered)     Precautions / Restrictions Precautions Precautions: Fall      Mobility  Bed Mobility                  Transfers Overall transfer level: Needs assistance Equipment used: None Transfers: Sit to/from Stand Sit to Stand: Min guard (without physcial contact)         General transfer comment: verbal and tactile cueing mostly for IV pole management  Ambulation/Gait Ambulation/Gait assistance: Min guard;Min assist Ambulation Distance (Feet): 150 Feet Assistive device: None Gait Pattern/deviations: Step-through pattern Gait velocity: approaching WNL   General Gait Details: Overall walking well; one instance of loss of  balance to the right with min assist to stabilize  Stairs            Wheelchair Mobility    Modified Rankin (Stroke Patients Only)       Balance Overall balance assessment: Needs assistance           Standing balance-Leahy Scale: Fair (approaching good)                               Pertinent Vitals/Pain Pain Assessment: No/denies pain    Home Living Family/patient expects to be discharged to:: Unsure Living Arrangements: Children                    Prior Function Level of Independence: Independent (Though not managing well)         Comments: Has been living alone; recent weight loss with question if pt is eating at all at home (but eating well here in hospital) -- raises the question: is pt able to meet his nutrition and safety needs alone at home     Hand Dominance        Extremity/Trunk Assessment   Upper Extremity Assessment: Defer to OT evaluation           Lower Extremity Assessment: Overall WFL for tasks assessed      Cervical / Trunk Assessment: Normal  Communication   Communication: Receptive difficulties;Expressive difficulties (previous stroke)  Cognition Arousal/Alertness: Awake/alert Behavior During Therapy: WFL for tasks assessed/performed;Restless;Impulsive Overall Cognitive Status: No family/caregiver present to determine  baseline cognitive functioning Area of Impairment: Orientation;Memory;Following commands;Safety/judgement;Awareness;Problem solving Orientation Level: Disoriented to;Time;Situation   Memory: Decreased recall of precautions;Decreased short-term memory Following Commands: Follows one step commands inconsistently Safety/Judgement: Decreased awareness of safety;Decreased awareness of deficits   Problem Solving: Slow processing;Decreased initiation;Difficulty sequencing;Requires verbal cues;Requires tactile cues General Comments: performed bathroom task unhygienically and out of sequence (washed  hands then wiped self); Unable to problem-solve related to IV pole manageemnt and mobility; did not remember his room number which was repeated to him multiple times prior in session    General Comments General comments (skin integrity, edema, etc.): HR range 88-120 with amb    Exercises        Assessment/Plan    PT Assessment Patient needs continued PT services  PT Diagnosis Other (comment) (AMS; gait and balance dysfunction)   PT Problem List Decreased activity tolerance;Decreased balance;Decreased cognition;Decreased knowledge of use of DME;Decreased knowledge of precautions;Decreased safety awareness;Cardiopulmonary status limiting activity  PT Treatment Interventions DME instruction;Gait training;Stair training;Functional mobility training;Therapeutic activities;Therapeutic exercise;Patient/family education   PT Goals (Current goals can be found in the Care Plan section) Acute Rehab PT Goals Patient Stated Goal: did not state PT Goal Formulation: Patient unable to participate in goal setting Time For Goal Achievement: 12/15/14 Potential to Achieve Goals: Good    Frequency Min 3X/week   Barriers to discharge Decreased caregiver support Needs 24 hour supervision and assist to safely be at home    Co-evaluation               End of Session   Activity Tolerance: Patient tolerated treatment well Patient left: in chair;with call bell/phone within reach (chair alarm pad present; needs monitor box) Nurse Communication: Mobility status;Other (comment) (need for monitor box)         Time: 6812-7517 PT Time Calculation (min) (ACUTE ONLY): 28 min   Charges:   PT Evaluation $Initial PT Evaluation Tier I: 1 Procedure PT Treatments $Gait Training: 8-22 mins   PT G Codes:        Roney Marion Hamff 12/01/2014, 3:06 PM  Roney Marion, [place] Pager [phone] Office [phone]

## 2014-12-01 NOTE — Progress Notes (Addendum)
PROGRESS NOTE  Logan Day EVO:[id number] DOB: 06-Aug-1932 DOA: 11/29/2014 PCP: Dorian Heckle, MD  HPI/Recap of past 24 hours:  Developed afib/rvr overnight, cardizem started.  Poor historian due to dementia/expressive aphasia and heard of hearing. Not in acute distress, not oriented to time.  Assessment/Plan: Principal Problem:   Acute respiratory failure with hypoxia Active Problems:   CKD (chronic kidney disease) stage 2, GFR 60-89 ml/min   AKI (acute kidney injury)   Hyperlipidemia   Hypertension   Acute diastolic heart failure   Chronic a-fib   Coagulopathy   Acute diastolic CHF (congestive heart failure)   Dementia due to another medical condition   Weight loss, unintentional   Hard of hearing   Malnutrition of moderate degree  Hypoxic respiratory failure:  -o2sats in the 80's on room air on admission. -cxr on admission diffuse bilateral interstitial changes. Ct chest pulmonary fibrosis?/bronciectasis. Repeat cxr improved less interstitial changes,  echo interval reduced EF compare to EF from 2012.  Received low dose lasix on admission, lasix d/ced due to worsening of cr. bnp improved. Lung exam dose has crackles, no edema, looks euvolemic though. Continue oxygen supplement. Wean as able.  H/p PAF, now afib/RVR, cardizem drip started overnight, cards consulted, will wean off cardizem, adjust betablocker for rate control. EP to see in am.  on chronic coumadin. Pharmacy to monitor coumadin dosing. On tele.  Bradycardia/QT prolongation on admission, zithromycin d/ced. sotalol d/ed, interval ekg to monitor QT interval.  keep mag>2, k>4, tsh wnl.   Elevation of cr, continue worsening of cr, ua no infection, renal US no hydro. renal dosing meds.  H/o htn/cad s/p MI /CVA with expressive aphasia: continue home meds asa/statin/betablocker.  FTT/dementia/weight loss: nutrition consult/PT//OT/discharge planning  Code Status: DNR  Family Communication:  patient  Disposition Plan: need discharge planning   Consultants:  cards  Procedures:  Ct chest /echocardiogram  Antibiotics:  Zithrox2doses, rocephin x1 dose.   Objective: BP 90/62 mmHg  Pulse 74  Temp(Src) 97.8 F (36.6 C) (Oral)  Resp 18  Ht 5\' 10"  (1.778 m)  Wt 64.2 kg (141 lb 8.6 oz)  BMI 20.31 kg/m2  SpO2 96%  Intake/Output Summary (Last 24 hours) at 12/01/14 1533 Last data filed at 12/01/14 1235  Gross per 24 hour  Intake 1095.75 ml  Output   1975 ml  Net -879.25 ml   Filed Weights   11/29/14 1449 11/30/14 0434 12/01/14 0510  Weight: 65.5 kg (144 lb 6.4 oz) 64.8 kg (142 lb 13.7 oz) 64.2 kg (141 lb 8.6 oz)    Exam:   General:  NAD  Cardiovascular: Irregular  Respiratory: bibasilar crackles  Abdomen: Soft/ND/NT, positive BS  Musculoskeletal: No Edema  Neuro:  Baseline dementia, not oriented to time, expressive aphasia. Hard of hearing.  Data Reviewed: Basic Metabolic Panel:  Recent Labs Lab 11/29/14 1053 11/30/14 0422 12/01/14 0530  NA 138 134* 136  K 3.8 4.2 3.3*  CL 104 101 99*  CO2 24 24 30   GLUCOSE 121* 142* 90  BUN 18 28* 36*  CREATININE 1.57* 1.71* 1.92*  CALCIUM 8.8 8.5 8.4*  MG  --   --  1.9   Liver Function Tests:  Recent Labs Lab 12/01/14 0530  AST 28  ALT 21  ALKPHOS 70  BILITOT 0.3  PROT 6.7  ALBUMIN 2.3*   No results for input(s): LIPASE, AMYLASE in the last 168 hours. No results for input(s): AMMONIA in the last 168 hours. CBC:  Recent Labs Lab 11/29/14 1053 12/01/14 0530  WBC 10.4 12.2*  HGB 12.7* 11.8*  HCT 38.7* 36.4*  MCV 88.6 88.6  PLT 377 348   Cardiac Enzymes:   No results for input(s): CKTOTAL, CKMB, CKMBINDEX, TROPONINI in the last 168 hours. BNP (last 3 results)  Recent Labs  11/29/14 1053 12/01/14 0530  BNP 1513.0* 424.9*    ProBNP (last 3 results) No results for input(s): PROBNP in the last 8760 hours.  CBG: No results for input(s): GLUCAP in the last 168 hours.  No  results found for this or any previous visit (from the past 240 hour(s)).   Studies: Ct Chest Wo Contrast  11/30/2014   CLINICAL DATA:  Hypoxia. History of atrial fibrillation. On Coumadin. History of stroke. Shortness of breath. 30 lb weight loss loss over 6 months.  EXAM: CT CHEST WITHOUT CONTRAST  TECHNIQUE: Multidetector CT imaging of the chest was performed following the standard protocol without IV contrast.  COMPARISON:  Chest x-ray 11/29/2014 and earlier  FINDINGS: Heart: Heart is enlarged. There is dense calcification of the coronary arteries. No pericardial effusion.  Vascular structures: There is dense atherosclerotic calcification of the thoracic aorta. Mildly ectatic ascending aorta without aneurysm.  Mediastinum/thyroid: The visualized portion of the thyroid gland has a normal appearance. Small, nonspecific mediastinal lymph nodes are identified. The largest is seen in the precarinal region, measuring 1.3 cm.  Lungs/Airways: The airways are patent. Small bilateral pleural effusions are noted. Effusion appears slightly loculated at the right lung base. There is thickening of the small airways. There are focal areas of bronchiectasis in the lower lobes bilaterally. Unit areas of consolidation. No suspicious pulmonary nodules.  Upper abdomen: Normal adrenal glands. There nonobstructing intrarenal calculi in the left kidney.  Chest wall/osseous structures: Thoracic spondylosis. No suspicious lytic or blastic lesions are identified.  IMPRESSION: 1. Cardiomegaly and coronary artery disease. 2. Mildly ectatic ascending aorta. 3. Nonspecific mediastinal lymph nodes may be reactive and related to fibrotic changes in the lungs. 4. Pulmonary fibrosis and bronchiectasis. 5. No focal pulmonary abnormality. 6. Small bilateral pleural effusions. 7. Nonobstructing intrarenal calculi left kidney.   Electronically Signed   By: Nolon Nations M.D.   On: 11/30/2014 17:43    Scheduled Meds: . ALPRAZolam  0.5 mg  Oral QHS  . aspirin EC  81 mg Oral Daily  . buPROPion  150 mg Oral BID  . feeding supplement (ENSURE ENLIVE)  237 mL Oral BID PC  . metoprolol tartrate  25 mg Oral Q6H  . pantoprazole  40 mg Oral Daily  . pravastatin  40 mg Oral QPM  . sertraline  100 mg Oral QPM  . sodium chloride  3 mL Intravenous Q12H  . warfarin  6 mg Oral ONCE-1800  . Warfarin - Pharmacist Dosing Inpatient   Does not apply q1800    Continuous Infusions: . diltiazem (CARDIZEM) infusion 5 mg/hr (12/01/14 0425)     Time spent: 22mins  Kristy Catoe MD, PhD  [facility] Pager [phone]. If 7PM-7AM, please contact night-coverage at [email or web address], password [facility] 12/01/2014, 3:33 PM  LOS: 2 days

## 2014-12-02 DIAGNOSIS — I5042 Chronic combined systolic (congestive) and diastolic (congestive) heart failure: Secondary | ICD-10-CM | POA: Insufficient documentation

## 2014-12-02 DIAGNOSIS — N183 Chronic kidney disease, stage 3 (moderate): Secondary | ICD-10-CM

## 2014-12-02 DIAGNOSIS — N179 Acute kidney failure, unspecified: Secondary | ICD-10-CM | POA: Insufficient documentation

## 2014-12-02 DIAGNOSIS — I480 Paroxysmal atrial fibrillation: Secondary | ICD-10-CM | POA: Insufficient documentation

## 2014-12-02 DIAGNOSIS — I5043 Acute on chronic combined systolic (congestive) and diastolic (congestive) heart failure: Secondary | ICD-10-CM

## 2014-12-02 LAB — CBC
HEMATOCRIT: 37.9 % — AB (ref 39.0–52.0)
Hemoglobin: 12.4 g/dL — ABNORMAL LOW (ref 13.0–17.0)
MCH: 29 pg (ref 26.0–34.0)
MCHC: 32.7 g/dL (ref 30.0–36.0)
MCV: 88.6 fL (ref 78.0–100.0)
Platelets: 321 10*3/uL (ref 150–400)
RBC: 4.28 MIL/uL (ref 4.22–5.81)
RDW: 14.7 % (ref 11.5–15.5)
WBC: 9.8 10*3/uL (ref 4.0–10.5)

## 2014-12-02 LAB — BASIC METABOLIC PANEL
ANION GAP: 11 (ref 5–15)
BUN: 38 mg/dL — ABNORMAL HIGH (ref 6–20)
CHLORIDE: 100 mmol/L — AB (ref 101–111)
CO2: 24 mmol/L (ref 22–32)
CREATININE: 1.73 mg/dL — AB (ref 0.61–1.24)
Calcium: 8.3 mg/dL — ABNORMAL LOW (ref 8.9–10.3)
GFR, EST AFRICAN AMERICAN: 41 mL/min — AB (ref >60)
GFR, EST NON AFRICAN AMERICAN: 36 mL/min — AB (ref >60)
Glucose, Bld: 84 mg/dL (ref 70–99)
Potassium: 4.3 mmol/L (ref 3.5–5.1)
Sodium: 135 mmol/L (ref 135–145)

## 2014-12-02 LAB — PROTIME-INR
INR: 1.66 — ABNORMAL HIGH (ref 0.00–1.49)
Prothrombin Time: 19.8 seconds — ABNORMAL HIGH (ref 11.6–15.2)

## 2014-12-02 LAB — MAGNESIUM: Magnesium: 2 mg/dL (ref 1.7–2.4)

## 2014-12-02 MED ORDER — WARFARIN SODIUM 6 MG PO TABS
6.0000 mg | ORAL_TABLET | Freq: Once | ORAL | Status: AC
  Administered 2014-12-02: 6 mg via ORAL
  Filled 2014-12-02: qty 1

## 2014-12-02 MED ORDER — AMIODARONE HCL 200 MG PO TABS
200.0000 mg | ORAL_TABLET | Freq: Every day | ORAL | Status: DC
Start: 2014-12-02 — End: 2014-12-03
  Administered 2014-12-02: 200 mg via ORAL
  Filled 2014-12-02 (×2): qty 1

## 2014-12-02 NOTE — Clinical Social Work Note (Signed)
Clinical Social Work Assessment  Patient Details  Name: Logan Day MRN: [id number] Date of Birth: 10-24-1932  Date of referral:  12/02/14               Reason for consult:  Facility Placement                Permission sought to share information with:  Chartered certified accountant granted to share information::  Yes, Verbal Permission Granted  Agency::  [facility]    Housing/Transportation Living arrangements for the past 2 months:  [place] of Information:  Adult Children (Spoke with pt [name] ) Patient Interpreter Needed:  None Criminal Activity/Legal Involvement Pertinent to Current Situation/Hospitalization:  No - Comment as needed Significant Relationships:  Adult Children Lives with:  Adult Children (Lives w/ son but pt son not always at the home) Do you feel safe going back to the place where you live?  No Need for family participation in patient care:  Yes (Comment) (Pt disoriented)   Care giving concerns:  Pt will not have recommended 24 hr supervision/assitance at discharge   Social Worker assessment / plan:  CSW spoke with pt [name] over the phone. Logan Day confirms pt has been to SNF in past (3 years ago) at [facility]. [name] expressed preference for this facility but also agreeable to referral being sent to all [facility] and [place] preferred facility is not an option.   Employment status:  Retired Nurse, adult PT Recommendations:  24 [place] / Referral to community resources:  [place]   Patient/Family's Response to care:  Pt son agreeable to recommendation for SNF given concern for pt safety at home alone.   Patient/Family's Understanding of and Emotional Response to Diagnosis, Current Treatment, and Prognosis:  Pt son has a good understanding of pt medical condition. Pt son with limited emotional  response but this was appropriate given nature of discussion and previous awareness of CSW involvement for dc planning   Emotional Assessment Appearance:  Well-Groomed Attitude/Demeanor/Rapport:  Unable to Assess Affect (typically observed):  Unable to Assess Orientation:  Oriented to Self, Oriented to Place Alcohol / Substance use:  Not Applicable Psych involvement (Current and /or in the community):  No (Comment)  Discharge Needs  Concerns to be addressed:  Lack of Support, Discharge Planning Concerns, Home Safety Concerns Readmission within the last 30 days:    Current discharge risk:  Dependent with Mobility, Cognitively Impaired Barriers to Discharge:  Continued Medical Work up   [employer], [place]

## 2014-12-02 NOTE — Progress Notes (Signed)
ANTICOAGULATION CONSULT NOTE - FOLLOW UP  Pharmacy Consult:  Coumadin Indication: atrial fibrillation  No Known Allergies  Vital Signs: Temp: 97.8 F (36.6 C) (05/02 0525) Temp Source: Oral (05/02 0525) BP: 96/72 mmHg (05/02 0525) Pulse Rate: 82 (05/02 0525)  Labs:  Recent Labs  11/29/14 1053 11/30/14 0422 12/01/14 0530 12/02/14 0400  HGB 12.7*  --  11.8* 12.4*  HCT 38.7*  --  36.4* 37.9*  PLT 377  --  348 321  APTT 57*  --   --   --   LABPROT 32.8* 36.2* 25.4* 19.8*  INR 3.17* 3.60* 2.29* 1.66*  CREATININE 1.57* 1.71* 1.92* 1.73*    Estimated Creatinine Clearance: 31.3 mL/min (by C-G formula based on Cr of 1.73).      Assessment: 62 YOM admitted on 11/29/2014 with worsening SOB to continue Coumadin for history of Afib.  Patient's INR on admit was supra-therapeutic and Coumadin was held for 2 days, then resumed yesterday 12/01/14.  INR decreased to sub-therapeutic level today; no bleeding reported.     Goal of Therapy:  INR 2-3    Plan:  - Repeat Coumadin 6mg  PO today - Daily PT / INR   Mialani Reicks D. Mina Marble, PharmD, BCPS Pager:  [phone] 12/02/2014, 9:46 AM

## 2014-12-02 NOTE — Evaluation (Signed)
Occupational Therapy Evaluation and Discharge Patient Details Name: Logan Day MRN: [id number] DOB: 1933/02/07 Today's Date: 12/02/2014    History of Present Illness Acute respiratory failure with hypoxia felt to be secondary to acute diastolic heart failure; poor historian; history of dementia, afib, htn   Clinical Impression   This 81 yo male admitted with above presents to acute OT with decreased balance and decreased cognition (pre-existing dementia), who was living along pta. Feel he is currently unsafe to live alone and needs SNF level of therapies short term and may be able to go back home after this or may need to go to ALF--discussed this with son. Acute OT will sign off.    Follow Up Recommendations  SNF    Equipment Recommendations   (TBD at next venue)       Precautions / Restrictions Precautions Precautions: Fall Restrictions Weight Bearing Restrictions: No      Mobility Bed Mobility Overal bed mobility: Independent                Transfers Overall transfer level: Needs assistance Equipment used: None Transfers: Sit to/from Stand Sit to Stand: Min guard         General transfer comment: min A for ambulation due to he sways and goes left or right when he looks left or right    Balance Overall balance assessment: Needs assistance Sitting-balance support: No upper extremity supported;Feet supported Sitting balance-Leahy Scale: Good     Standing balance support: No upper extremity supported Standing balance-Leahy Scale: Poor Standing balance comment: Balance is thrown off when you ask him ambulate and look left then right--he tends to go in that direction thus having to do a "side'step" to not lose his balance                            ADL Overall ADL's : Needs assistance/impaired Eating/Feeding: Independent;Sitting   Grooming: Wash/dry face;Min guard;Standing   Upper Body Bathing: Set up;Sitting   Lower Body Bathing: Set  up;Supervison/ safety (with minguard A sit<>stand)   Upper Body Dressing : Set up;Sitting   Lower Body Dressing: Set up;Supervision/safety (with minguard A sit<>stand)   Toilet Transfer: Min guard;Ambulation;Regular Museum/gallery exhibitions officer and Hygiene: Min guard;Sit to/from stand               Vision Additional Comments: wears glasses          Pertinent Vitals/Pain Pain Assessment: No/denies pain     Hand Dominance Right   Extremity/Trunk Assessment Upper Extremity Assessment Upper Extremity Assessment: Overall WFL for tasks assessed           Communication Communication Communication: HOH   Cognition Arousal/Alertness: Awake/alert Behavior During Therapy: Impulsive Overall Cognitive Status: History of cognitive impairments - at baseline Area of Impairment: Orientation Orientation Level:  (increased time to tell me his name, year of birth and month of birth)     Following Commands: Follows one step commands inconsistently     Problem Solving: Slow processing;Decreased initiation;Difficulty sequencing;Requires verbal cues;Requires tactile cues General Comments: Today pt did wipe himself then washed his hands--but had trouble finding the soap and paper towels ("because that is not what he has at home")              Home Living Family/patient expects to be discharged to:: [place]: Children (available intermittently)  Prior Functioning/Environment Level of Independence: Independent        Comments: Has been living alone; recent weight loss. Son who lives 4 house down from him checks on him at least every other evening. Pt heats up his food, does not cook. Was driving up until the Thursday before this admisson. Was managing his own meds--had called for a refill, per pharmacy they had faxed a refill request to the MD but the MDs office had not  called/faxed them back, pt ended up going without a med for 2-4 days and ended up in the hospital    OT Diagnosis: Generalized weakness;Cognitive deficits   OT Problem List: Decreased strength;Impaired balance (sitting and/or standing);Decreased cognition      OT Goals(Current goals can be found in the care plan section) Acute Rehab OT Goals Patient Stated Goal: would like to go home  OT Frequency:                End of Session    Activity Tolerance: Patient tolerated treatment well Patient left: in bed;with call bell/phone within reach;with bed alarm set;with family/visitor present   Time: 8592-9244 OT Time Calculation (min): 33 min Charges:  OT General Charges $OT Visit: 1 Procedure OT Evaluation $Initial OT Evaluation Tier I: 1 Procedure OT Treatments $Self Care/Home Management : 8-22 mins  Almon Register [phone] 12/02/2014, 1:06 PM

## 2014-12-02 NOTE — Progress Notes (Addendum)
PROGRESS NOTE    Logan Day YTK:[id number] DOB: 05-09-1933 DOA: 11/29/2014 PCP: Dorian Heckle, MD  HPI/Brief narrative 81 y.o.male hx of CAD with previous stents most recently stent to LCX 10/2012 , PAF on both sotalol, Toprol XL at home and coumadin, hx of embolic CVA, HL, HL, prostate CA, admitted with SOB. He is somewhat of a limited historian, he reports ever since his stroke he has had trouble remembering and expressing himself. He reports he was not able to get his sotalol for 2-3 days at home. While off the medication noted increased palpitations and SOB. He restarted his sotalol but never did quite feel well again. Noted some palpitations during that time.    Assessment/Plan:  Acute Hypoxic respiratory failure - O2 sats in the 80's on room air on admission. - cxr on admission diffuse bilateral interstitial changes. Ct chest pulmonary fibrosis?/bronciectasis. Repeat cxr improved less interstitial changes,  - Hypoxia seems to have resolved and is saturating in the 90s on room air  PAF with RVR - As per son, patient had run out of sotalol for 2-3 days but continued to take his Toprol-XL. - On admission was in sinus rhythm but later went into A. fib with RVR - Treated briefly with Cardizen gtt - Reverted back to NSR overnight - Cardiology follow-up appreciated and have requested their EPS colleagues to consult given that he was on both sotalol and Toprol-XL at home and currently sotalol held due to renal insufficiency and prolonged QTC and feels that beta blocker alone may not be enough to keep him in sinus rhythm. - INR was therapeutic on 5/1 but 1.6 on 5/2. Coumadin per pharmacy.  Acute on chronic combined systolic and diastolic CHF - 5/57/32 echo: LVEF 40-45 % - Treated initially with IV Lasix which was stopped as patient was not fluid overloaded any longer & creatinine started trending up  Acute on stage III chronic kidney disease - Baseline creatinine probably in the  1.3-1.5 range -  Creatinine peaked at 1.92 on 5/1  - Acute kidney injury was likely precipitated by diuresis-held  - Creatinine improving. Follow BMP in a.m. - Renal ultrasound without hydronephrosis   Bradycardia/QT prolongation  - on admission,  - zithromycin d/ced. sotalol d/ed, DC when necessary Zofran. - interval ekg to monitor QT interval.  - keep mag>2, k>4,  - tsh wnl.  - Await EPS input.   Essential hypertension  - Periodic soft blood pressures but otherwise mostly controlled  CAD with previous stents, most recently stent to LCx 10/2012  History of CVA with expressive aphasia - continue home meds asa/statin/betablocker.  FTT/dementia/weight loss:  - nutrition consult/PT//OT/discharge planning - SNF when medically stable  Pulmonary fibrosis and bronchiectasis - Seen on CT chest without contrast - Etiology on known/unclear - Outpatient follow-up and may consider pulmonology consultation   Code Status: DNR Family Communication: Discussed with patient's son at bedside. Disposition Plan: DC to SNF when medically stable   Consultants:  Cardiology  EPS  Procedures:  None  Antibiotics:  Azithromycin 2 doses-DC'd 4/30  Rocephin 1 dose 4/29  Subjective: Aphasic. States that he feels much better with improvement in his dyspnea. Denies chest pain or other complaints.   Objective: Filed Vitals:   12/02/14 0220 12/02/14 0525 12/02/14 1030 12/02/14 1423  BP: 110/69 96/72 128/61 110/55  Pulse: 85 82 58 59  Temp: 97.6 F (36.4 C) 97.8 F (36.6 C)  98.2 F (36.8 C)  TempSrc: Oral Oral  Oral  Resp: 20 18  18  Height:      Weight:  63.884 kg (140 lb 13.4 oz)    SpO2: 93% 98%  98%    Intake/Output Summary (Last 24 hours) at 12/02/14 1544 Last data filed at 12/02/14 1353  Gross per 24 hour  Intake 1229.75 ml  Output   1000 ml  Net 229.75 ml   Filed Weights   11/30/14 0434 12/01/14 0510 12/02/14 0525  Weight: 64.8 kg (142 lb 13.7 oz) 64.2 kg (141  lb 8.6 oz) 63.884 kg (140 lb 13.4 oz)     Exam:  General exam: Pleasant elderly male sitting up comfortably in bed Respiratory system: Diminished breath sounds in the bases with coarse Velcro-like crackles in the bases. Rest of lung fields clear to auscultation . No increased work of breathing. Cardiovascular system: S1 & S2 heard, RRR. No JVD, murmurs, gallops, clicks or pedal edema. telemetry: Reverted from A. fib to sinus bradycardia in the 50s-SR at 3:15 AM on 5/2.  Gastrointestinal system: Abdomen is nondistended, soft and nontender. Normal bowel sounds heard. Central nervous system: Alert and oriented. Aphasic . No focal neurological deficits. Extremities: Symmetric 5 x 5 power.   Data Reviewed: Basic Metabolic Panel:  Recent Labs Lab 11/29/14 1053 11/30/14 0422 12/01/14 0530 12/02/14 0400  NA 138 134* 136 135  K 3.8 4.2 3.3* 4.3  CL 104 101 99* 100*  CO2 24 24 30 24   GLUCOSE 121* 142* 90 84  BUN 18 28* 36* 38*  CREATININE 1.57* 1.71* 1.92* 1.73*  CALCIUM 8.8 8.5 8.4* 8.3*  MG  --   --  1.9  --    Liver Function Tests:  Recent Labs Lab 12/01/14 0530  AST 28  ALT 21  ALKPHOS 70  BILITOT 0.3  PROT 6.7  ALBUMIN 2.3*   No results for input(s): LIPASE, AMYLASE in the last 168 hours. No results for input(s): AMMONIA in the last 168 hours. CBC:  Recent Labs Lab 11/29/14 1053 12/01/14 0530 12/02/14 0400  WBC 10.4 12.2* 9.8  HGB 12.7* 11.8* 12.4*  HCT 38.7* 36.4* 37.9*  MCV 88.6 88.6 88.6  PLT 377 348 321   Cardiac Enzymes: No results for input(s): CKTOTAL, CKMB, CKMBINDEX, TROPONINI in the last 168 hours. BNP (last 3 results) No results for input(s): PROBNP in the last 8760 hours. CBG: No results for input(s): GLUCAP in the last 168 hours.  No results found for this or any previous visit (from the past 240 hour(s)).       Studies: Ct Chest Wo Contrast  11/30/2014   CLINICAL DATA:  Hypoxia. History of atrial fibrillation. On Coumadin. History of  stroke. Shortness of breath. 30 lb weight loss loss over 6 months.  EXAM: CT CHEST WITHOUT CONTRAST  TECHNIQUE: Multidetector CT imaging of the chest was performed following the standard protocol without IV contrast.  COMPARISON:  Chest x-ray 11/29/2014 and earlier  FINDINGS: Heart: Heart is enlarged. There is dense calcification of the coronary arteries. No pericardial effusion.  Vascular structures: There is dense atherosclerotic calcification of the thoracic aorta. Mildly ectatic ascending aorta without aneurysm.  Mediastinum/thyroid: The visualized portion of the thyroid gland has a normal appearance. Small, nonspecific mediastinal lymph nodes are identified. The largest is seen in the precarinal region, measuring 1.3 cm.  Lungs/Airways: The airways are patent. Small bilateral pleural effusions are noted. Effusion appears slightly loculated at the right lung base. There is thickening of the small airways. There are focal areas of bronchiectasis in the lower lobes bilaterally. Unit  areas of consolidation. No suspicious pulmonary nodules.  Upper abdomen: Normal adrenal glands. There nonobstructing intrarenal calculi in the left kidney.  Chest wall/osseous structures: Thoracic spondylosis. No suspicious lytic or blastic lesions are identified.  IMPRESSION: 1. Cardiomegaly and coronary artery disease. 2. Mildly ectatic ascending aorta. 3. Nonspecific mediastinal lymph nodes may be reactive and related to fibrotic changes in the lungs. 4. Pulmonary fibrosis and bronchiectasis. 5. No focal pulmonary abnormality. 6. Small bilateral pleural effusions. 7. Nonobstructing intrarenal calculi left kidney.   Electronically Signed   By: Nolon Nations M.D.   On: 11/30/2014 17:43   US Renal  12/01/2014   CLINICAL DATA:  Acute kidney injury. Chronic kidney disease stage 2. Hypertension.  EXAM: RENAL / URINARY TRACT ULTRASOUND COMPLETE  COMPARISON:  01/26/2008  FINDINGS: Right Kidney:  Length: 10.3 cm. Echogenicity within  normal limits. No mass or hydronephrosis visualized. A few tiny less than 1 cm echogenic foci are noted, suspicious for tiny nonobstructive calculi.  Left Kidney:  Length: 9.6 cm. Echogenicity within normal limits. No mass or hydronephrosis visualized. A few tiny less than 1 cm echogenic foci are noted, suspicious for tiny nonobstructive calculi.  Bladder:  Appears normal for degree of bladder distention.  Other:  Bilateral pleural effusions incidentally noted.  IMPRESSION: No evidence of hydronephrosis. Probable tiny less than 1 cm bilateral nephrolithiasis.  Bilateral pleural effusions incidentally noted.   Electronically Signed   By: Earle Gell M.D.   On: 12/01/2014 13:35   Dg Chest Port 1 View  12/01/2014   CLINICAL DATA:  Newly diagnosed with CHF. Shortness of breath. Atrial fibrillation.  EXAM: PORTABLE CHEST - 1 VIEW  COMPARISON:  11/29/2014  FINDINGS: Heart is enlarged. There is mild interstitial pulmonary edema slightly improved compared to prior study. Suspect small bilateral pleural effusions. No consolidations.  IMPRESSION: Slight improvement in interstitial edema.   Electronically Signed   By: Nolon Nations M.D.   On: 12/01/2014 10:45        Scheduled Meds: . ALPRAZolam  0.5 mg Oral QHS  . aspirin EC  81 mg Oral Daily  . buPROPion  150 mg Oral BID  . feeding supplement (ENSURE ENLIVE)  237 mL Oral BID PC  . metoprolol tartrate  25 mg Oral Q6H  . pantoprazole  40 mg Oral Daily  . pravastatin  40 mg Oral QPM  . sertraline  100 mg Oral QPM  . sodium chloride  3 mL Intravenous Q12H  . warfarin  6 mg Oral ONCE-1800  . Warfarin - Pharmacist Dosing Inpatient   Does not apply q1800   Continuous Infusions: . 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 [facility] Pager [phone]  If 7PM-7AM, please contact night-coverage [email or web address] Password [email or web address] 12/02/2014, 3:44 PM    LOS: 3 days

## 2014-12-02 NOTE — Progress Notes (Signed)
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 Had some palpitation days ago which has resolved.   CURRENT MEDS . ALPRAZolam  0.5 mg Oral QHS  . aspirin EC  81 mg Oral Daily  . buPROPion  150 mg Oral BID  . feeding supplement (ENSURE ENLIVE)  237 mL Oral BID PC  . metoprolol tartrate  25 mg Oral Q6H  . pantoprazole  40 mg Oral Daily  . pravastatin  40 mg Oral QPM  . sertraline  100 mg Oral QPM  . sodium chloride  3 mL Intravenous Q12H  . warfarin  6 mg Oral ONCE-1800  . Warfarin - Pharmacist Dosing Inpatient   Does not apply q1800    OBJECTIVE  Filed Vitals:   12/01/14 2213 12/02/14 0220 12/02/14 0525 12/02/14 1030  BP: 98/65 110/69 96/72 128/61  Pulse: 72 85 82 58  Temp: 97.3 F (36.3 C) 97.6 F (36.4 C) 97.8 F (36.6 C)   TempSrc: Oral Oral Oral   Resp: 18 20 18    Height:      Weight:   140 lb 13.4 oz (63.884 kg)   SpO2: 98% 93% 98%     Intake/Output Summary (Last 24 hours) at 12/02/14 1106 Last data filed at 12/02/14 1015  Gross per 24 hour  Intake 989.75 ml  Output   1600 ml  Net -610.25 ml   Filed Weights   11/30/14 0434 12/01/14 0510 12/02/14 0525  Weight: 142 lb 13.7 oz (64.8 kg) 141 lb 8.6 oz (64.2 kg) 140 lb 13.4 oz (63.884 kg)    PHYSICAL EXAM  General: Pleasant, NAD. Neuro: Alert and oriented X 3. Moves all extremities spontaneously. Psych: Normal affect. HEENT:  Normal  Neck: Supple without bruits or JVD. Lungs:   Resp regular and unlabored, CTA. Heart: RRR no s3, s4, or murmurs. Abdomen: Soft, non-tender, non-distended, BS + x 4.  Extremities: No clubbing, cyanosis or edema. DP/PT/Radials 2+ and equal bilaterally.  Accessory Clinical Findings  CBC  Recent Labs  12/01/14 0530 12/02/14 0400  WBC 12.2* 9.8  HGB 11.8* 12.4*  HCT 36.4* 37.9*  MCV 88.6 88.6  PLT 348 998   Basic Metabolic Panel  Recent Labs  12/01/14 0530 12/02/14 0400  NA 136 135  K 3.3* 4.3  CL 99* 100*  CO2 30 24  GLUCOSE 90 84  BUN 36* 38*  CREATININE 1.92* 1.73*  CALCIUM 8.4* 8.3*  MG 1.9  --    Liver Function Tests  Recent Labs  12/01/14 0530  AST 28  ALT 21  ALKPHOS 70  BILITOT 0.3  PROT 6.7  ALBUMIN 2.3*   Thyroid Function Tests  Recent Labs  11/29/14 1532  TSH 2.151    TELE Converted to NSR with HR 70s overnight.    ECG  No new EKG  Echocardiogram 11/29/2014  LV EF: 40% -  45%  ------------------------------------------------------------------- Indications:   CHF - 428.0.  -------------------------------------------------------------------  History:  PMH: CVA. Dementia. Aphasia. Risk factors: Hypertension. Dyslipidemia.  ------------------------------------------------------------------- Study Conclusions  - Left ventricle: LVEF is approximately 40% witrh diffuse hypokinesi worse in the inferior and posterior walls The cavity size was mildly dilated. Wall thickness was increased in a pattern of mild LVH. Systolic function was mildly to moderately reduced. The estimated ejection fraction was in the range of 40% to 45%. - Mitral valve: There was mild to moderate regurgitation. - Right ventricle: Systolic function was mildly reduced.     Radiology/Studies  Ct Chest Wo Contrast  11/30/2014   CLINICAL DATA:  Hypoxia. History of atrial fibrillation. On Coumadin. History of stroke. Shortness of breath. 30 lb weight loss loss over 6 months.  EXAM: CT CHEST  WITHOUT CONTRAST  TECHNIQUE: Multidetector CT imaging of the chest was performed following the standard protocol without IV contrast.  COMPARISON:  Chest x-ray 11/29/2014 and earlier  FINDINGS: Heart: Heart is enlarged. There is dense calcification of the coronary arteries. No pericardial effusion.  Vascular structures: There is dense atherosclerotic calcification of the thoracic aorta. Mildly ectatic ascending aorta without aneurysm.  Mediastinum/thyroid: The visualized portion of the thyroid gland has a normal appearance. Small, nonspecific mediastinal lymph nodes are identified. The largest is seen in the precarinal region, measuring 1.3 cm.  Lungs/Airways: The airways are patent. Small bilateral pleural effusions are noted. Effusion appears slightly loculated at the right lung base. There is thickening of the small airways. There are focal areas of bronchiectasis in the lower lobes bilaterally. Unit areas of consolidation. No suspicious pulmonary nodules.  Upper abdomen: Normal adrenal glands. There nonobstructing intrarenal calculi in the left kidney.  Chest wall/osseous structures: Thoracic spondylosis. No suspicious lytic or blastic lesions are identified.  IMPRESSION: 1. Cardiomegaly and coronary artery disease. 2. Mildly ectatic ascending aorta. 3. Nonspecific mediastinal lymph nodes may be reactive and related to fibrotic changes in the lungs. 4. Pulmonary fibrosis and bronchiectasis. 5. No focal pulmonary abnormality. 6. Small bilateral pleural effusions. 7. Nonobstructing intrarenal calculi left kidney.   Electronically Signed   By: Nolon Nations M.D.   On: 11/30/2014 17:43   US Renal  12/01/2014   CLINICAL DATA:  Acute kidney injury. Chronic kidney disease stage 2. Hypertension.  EXAM: RENAL / URINARY TRACT ULTRASOUND COMPLETE  COMPARISON:  01/26/2008  FINDINGS: Right Kidney:  Length: 10.3 cm. Echogenicity within normal limits. No mass or hydronephrosis visualized. A few tiny less than 1 cm  echogenic foci are noted, suspicious for tiny nonobstructive calculi.  Left Kidney:  Length: 9.6 cm. Echogenicity within normal limits. No mass or hydronephrosis visualized. A few tiny less than 1 cm echogenic foci are noted, suspicious for tiny nonobstructive calculi.  Bladder:  Appears normal for degree of bladder distention.  Other:  Bilateral pleural effusions incidentally noted.  IMPRESSION: No evidence of hydronephrosis. Probable tiny less than 1 cm bilateral nephrolithiasis.  Bilateral pleural effusions incidentally noted.   Electronically Signed   By: Earle Gell M.D.   On: 12/01/2014 13:35   Dg Chest Port 1 View  12/01/2014   CLINICAL DATA:  Newly diagnosed with CHF. Shortness of breath. Atrial fibrillation.  EXAM: PORTABLE CHEST - 1 VIEW  COMPARISON:  11/29/2014  FINDINGS: Heart is enlarged. There is mild interstitial pulmonary edema slightly improved compared to prior study. Suspect small bilateral pleural effusions. No consolidations.  IMPRESSION: Slight improvement in interstitial edema.   Electronically Signed   By: Nolon Nations M.D.   On: 12/01/2014 10:45   Dg Chest Portable 1  View  11/29/2014   CLINICAL DATA:  Shortness of breath.  Weakness.  EXAM: PORTABLE CHEST - 1 VIEW  COMPARISON:  12/28/2013  FINDINGS: Diffuse abnormal increased interstitial accentuation superimposed on emphysema. The interstitial accentuation is coarse with patchy ground-glass opacities throughout both lungs.  Heart size within normal limits. Atherosclerotic calcification in the wall of the aortic arch. Faint airspace opacities at the lung bases.  IMPRESSION: 1. Bilateral abnormal interstitial accentuation, airway thickening, and indistinct airspace opacities at the lung bases. The appearance favors acute pulmonary edema. Atypical pneumonia is a differential diagnostic consideration. 2. Emphysema.   Electronically Signed   By: Van Clines M.D.   On: 11/29/2014 10:46    ASSESSMENT AND PLAN  81 yo male with  h/o CAD with previous stents most recently stent to LCx 10/2012, PAF on sotalol and coumadin, h/o embolic CVA, CKD, HTN, HLD, prostate CA admitted for SOB. Pt states he missed 2-3 days of sotalol and noted increased palpitation and SOB while off the medication. Although he restarted sotalol, symptom did not improve.   1. PAF - now in NSR  - off sotalol for 2-3 days, began to develop palpitation and SOB in the mean time, restarted prior to admission  - was in SR on admit but later went into afib with RVR, now back in NSR overnight  - on 25mg  toprol XL along with 120mg  BID of sotalol at home, since he converted overnight, will check with MD whether he still needs inpatient EP consult vs discharge and outpatient EP consult. Consider consolidate lopressor 25mg  q6hr to 50mg  BID, BP does not allow uptitrate.  2. Acute on chronic combined systolic and diastolic HF  - 10/28/49 echo: LVEF 40-45% (LVEF 50% by echo in 2012), mild to mod MR, mild RV dysfunction, diastolic function not described  - IV lasix stopped as patient no longer appears to be fluid overloaded when cardiology consulted and Cr trending up  3. Acute on chronic renal insufficiency 4. CAD with previous stents most recently stent to LCx 03/8415 5. h/o embolic CVA 6. HTN 7. HLD 8. prostate CA  Signed, Woodward Ku Pager: [phone]  Feels fine currently in NSR agree with inpatient EP consult as may need tikosyn initiation  QT close to 500 msec When not on sotolol for few days.  dont think beta blocker alone is enough to keep him in NSR  Discussed with patient and son.  Jenkins Rouge

## 2014-12-02 NOTE — Care Management Note (Signed)
Case Management Note  Patient Details  Name: Logan Day MRN: [id number] Date of Birth: 1933/04/13  Subjective/Objective:         Pt adm on 11/29/14 with hypoxia, CHF, Afib.  PTA, pt resides at home with son, who works.  Pt does not have 24h supervision at home, and has mild dementia.              Action/Plan: Met with pt's son, Richardson Landry, at bedside.  He lives near pt, and states his brother lives with him, but is in and out of the home.  He is concerned that his father may need SNF at dc, and possibly even ALF in the near future.  He states that he and his wife and son check on pt when his brother is not there, otherwise, pt is alone.  He has been at [facility] in the past.  CSW consulted for possible SNF at dc for rehab.  Richardson Landry appreciative of my visit.  Will follow progress.    Expected Discharge Date:  12/04/14               Expected Discharge Plan:  Skilled Nursing Facility  In-House Referral:  Clinical Social Work  Discharge planning Services  CM Consult  Post Acute Care Choice:    Choice offered to:     DME Arranged:    DME Agency:     HH Arranged:    [place] Agency:     Status of Service:  In process, will continue to follow  Medicare Important Message Given:  Yes Date Medicare IM Given:  12/02/14 Medicare IM give by:  Reinaldo Raddle, RN, BSN  Date Additional Medicare IM Given:    Additional Medicare Important Message give by:     If discussed at [place] of Stay Meetings, dates discussed:    Additional Comments:  Ella Bodo, RN 12/02/2014, 3:54 PM

## 2014-12-02 NOTE — Consult Note (Signed)
Patient ID: Logan Day MRN: [id number], DOB/AGE: 79-Jul-1934   Admit date: 11/29/2014   Primary Physician: Dorian Heckle, MD Primary Cardiologist: Dr. Tamala Julian Electrophysiologist: New (Dr. Lovena Day)  Pt. Profile:  81 y/o male with h/o CAD, PAF on sotalol and coumadin, prior embolic CVA, HTN, HLD and prostate CA, admitted for symptomatic atrial fibrillation with RVR. He has reverted back to NSR.  Problem List  Past Medical History  Diagnosis Date  . Hypertension   . Atrial fibrillation   . Hypercholesteremia   . MI (myocardial infarction)     x 2  . Angina   . Cancer     prostate cancer  . Coronary artery disease     Circ stent (2009 cath patent)  . Prostate cancer   . Hyperlipidemia   . MI (myocardial infarction)   . TIA (transient ischemic attack)   . GERD (gastroesophageal reflux disease)   . Shortness of breath dyspnea     Past Surgical History  Procedure Laterality Date  . Prostayectomy    . Fracture surgery    . Coronary angioplasty with stent placement  10/19/2012    DES  to circumflex  . Percutaneous coronary stent intervention (pci-s) N/A 10/19/2012    Procedure: PERCUTANEOUS CORONARY STENT INTERVENTION (PCI-S);  Surgeon: Sinclair Grooms, MD;  Location: [facility] CATH LAB;  Service: Cardiovascular;  Laterality: N/A;     Allergies  No Known Allergies  HPI  The patient is a 81 y/o male, followed by Dr. Tamala Julian. His medical history is significant for CAD with prior LCx stenting in 2014, h/o PAF on sotalol and coumadin w/ CHA2DS2 VASc score > 6 given h/o embolic CVA, CHF, HTN, vas dz and age >61. Most recent 2D echo on 11/26/14 demonstrated mildly reduced LVF with EF of 40-45%. Also with LVH and diffuse hypokinesis in the inferior and posterior walls and mild-moderate MR.   Up until recently, his atrial fibrillation had been well controlled on AAD therapy with sotalol. However the patient reports that he recently ran out of his sotalol. He called his pharmacy but  there was a delay in getting the prescription refilled. He was without sotalol for about 2-3 days. During that time periord, he developed palpitations and dyspnea. He restarted his sotalol at home w/o any improvement. Due to worsening dyspnea, he presented to the [facility] ER on 11/29/14 for evaluation and was found to be in rapid atrial fibrillation. He was admitted by IM and started on IV Cardizem and continued on PO metoprolol and sotalol. However, his sotalol was discontinued by primary team on 11/30/14 due to bradycardia and prolonged QTc (520 ms on EKG). Also in the setting of AKI with SCr at 1.92 on admit. Also on azithromycin at that time. Decision was made to keep off of sotalol due to renal function and long QTc. SCr has improved some but remains elevated at 1.73.  Most recent EKG from 5/1 shows improved QTc down to 457 ms. TSH is normal as well as K.   Overnight, he converted to NSR. IV Cardizem was discontinued due to hypotension. He is currently on Lopressor, 25 mg Q6H. In NSR with rate in the 60s. BP is stable. He is now asymptomatic. EP consulted for further AAD therapy recs.    Home Medications  Prior to Admission medications   Medication Sig Start Date End Date Taking? Authorizing Provider  ALPRAZolam Duanne Moron) 0.5 MG tablet Take 1 tablet by mouth at bedtime. 04/09/14  Yes Historical Provider, MD  aspirin EC 81 MG tablet Take 1 tablet (81 mg total) by mouth Day. 12/18/13  Yes Belva Crome, MD  buPROPion [facility] SR) 150 MG 12 hr tablet Take 150 mg by mouth 2 (two) times Day.  08/01/14  Yes Historical Provider, MD  metoprolol succinate (TOPROL XL) 25 MG 24 hr tablet Take 1 tablet (25 mg total) by mouth Day. 01/14/14  Yes Belva Crome, MD  Multiple Vitamin (MULTIVITAMIN WITH MINERALS) TABS tablet Take 1 tablet by mouth every evening.   Yes Historical Provider, MD  omeprazole (PRILOSEC) 20 MG capsule Take 20 mg by mouth every morning.   Yes Historical Provider, MD  oxymetazoline (AFRIN)  0.05 % nasal spray Place 1 spray into both nostrils 2 (two) times Day as needed for congestion.   Yes Historical Provider, MD  pravastatin (PRAVACHOL) 40 MG tablet Take 40 mg by mouth every evening.   Yes Historical Provider, MD  sertraline (ZOLOFT) 100 MG tablet Take 100 mg by mouth every evening.   Yes Historical Provider, MD  sotalol (BETAPACE) 120 MG tablet Take 1 tablet (120 mg total) by mouth 2 (two) times Day. 11/22/14  Yes Belva Crome, MD  traMADol (ULTRAM) 50 MG tablet Take 50 mg by mouth 2 (two) times Day as needed for pain.   Yes Historical Provider, MD  warfarin (COUMADIN) 4 MG tablet Take 2-4 mg by mouth every evening. Take half of a tablet (2mg ) on Tuesdays & Fridays. On all other days take a whole tablet (4mg ).   Yes Historical Provider, MD  nitroGLYCERIN (NITROSTAT) 0.4 MG SL tablet Place 0.4 mg under the tongue every 5 (five) minutes as needed for chest pain.    Historical Provider, MD    Family History  Family History  Problem Relation Age of Onset  . Coronary artery disease Son   . Heart disease Mother   . Heart disease Father     Social History  History   Social History  . Marital Status: Widowed    Spouse Name: N/A  . Number of Children: N/A  . Years of Education: N/A   Occupational History  . Not on file.   Social History Main Topics  . Smoking status: Former Smoker -- 1.00 packs/day for 40 years  . Smokeless tobacco: Current User    Types: Chew     Comment: quit smoking 2002  . Alcohol Use: No  . Drug Use: No  . Sexual Activity: No   Other Topics Concern  . Not on file   Social History Narrative     Review of Systems General:  No chills, fever, night sweats or weight changes.  Cardiovascular:  No chest pain, dyspnea on exertion, edema, orthopnea, palpitations, paroxysmal nocturnal dyspnea. Dermatological: No rash, lesions/masses Respiratory: No cough, dyspnea Urologic: No hematuria, dysuria Abdominal:   No nausea, vomiting, diarrhea,  bright red blood per rectum, melena, or hematemesis Neurologic:  No visual changes, wkns, changes in mental status. All other systems reviewed and are otherwise negative except as noted above.  Physical Exam  Blood pressure 128/61, pulse 58, temperature 97.8 F (36.6 C), temperature source Oral, resp. rate 18, height 5\' 10"  (1.778 m), weight 140 lb 13.4 oz (63.884 kg), SpO2 98 %.  General: Pleasant, NAD Psych: Normal affect. Neuro: Alert and oriented X 3. Moves all extremities spontaneously. HEENT: Normal  Neck: Supple without bruits or JVD. Lungs:  Resp regular and unlabored, CTA. Heart: RRR no s3, s4, or murmurs. Abdomen: Soft, non-tender, non-distended, BS +  x 4.  Extremities: No clubbing, cyanosis or edema. DP/PT/Radials 2+ and equal bilaterally.  Labs  Troponin (Point of Care Test) No results for input(s): TROPIPOC in the last 72 hours. No results for input(s): CKTOTAL, CKMB, TROPONINI in the last 72 hours. Lab Results  Component Value Date   WBC 9.8 12/02/2014   HGB 12.4* 12/02/2014   HCT 37.9* 12/02/2014   MCV 88.6 12/02/2014   PLT 321 12/02/2014    Recent Labs Lab 12/01/14 0530 12/02/14 0400  NA 136 135  K 3.3* 4.3  CL 99* 100*  CO2 30 24  BUN 36* 38*  CREATININE 1.92* 1.73*  CALCIUM 8.4* 8.3*  PROT 6.7  --   BILITOT 0.3  --   ALKPHOS 70  --   ALT 21  --   AST 28  --   GLUCOSE 90 84   Lab Results  Component Value Date   CHOL 153 07/16/2011   HDL 37* 07/16/2011   LDLCALC 89 07/16/2011   TRIG 137 07/16/2011   No results found for: DDIMER   Radiology/Studies  Ct Chest Wo Contrast  11/30/2014   CLINICAL DATA:  Hypoxia. History of atrial fibrillation. On Coumadin. History of stroke. Shortness of breath. 30 lb weight loss loss over 6 months.  EXAM: CT CHEST WITHOUT CONTRAST  TECHNIQUE: Multidetector CT imaging of the chest was performed following the standard protocol without IV contrast.  COMPARISON:  Chest x-ray 11/29/2014 and earlier  FINDINGS: Heart:  Heart is enlarged. There is dense calcification of the coronary arteries. No pericardial effusion.  Vascular structures: There is dense atherosclerotic calcification of the thoracic aorta. Mildly ectatic ascending aorta without aneurysm.  Mediastinum/thyroid: The visualized portion of the thyroid gland has a normal appearance. Small, nonspecific mediastinal lymph nodes are identified. The largest is seen in the precarinal region, measuring 1.3 cm.  Lungs/Airways: The airways are patent. Small bilateral pleural effusions are noted. Effusion appears slightly loculated at the right lung base. There is thickening of the small airways. There are focal areas of bronchiectasis in the lower lobes bilaterally. Unit areas of consolidation. No suspicious pulmonary nodules.  Upper abdomen: Normal adrenal glands. There nonobstructing intrarenal calculi in the left kidney.  Chest wall/osseous structures: Thoracic spondylosis. No suspicious lytic or blastic lesions are identified.  IMPRESSION: 1. Cardiomegaly and coronary artery disease. 2. Mildly ectatic ascending aorta. 3. Nonspecific mediastinal lymph nodes may be reactive and related to fibrotic changes in the lungs. 4. Pulmonary fibrosis and bronchiectasis. 5. No focal pulmonary abnormality. 6. Small bilateral pleural effusions. 7. Nonobstructing intrarenal calculi left kidney.   Electronically Signed   By: Nolon Nations M.D.   On: 11/30/2014 17:43   US Renal  12/01/2014   CLINICAL DATA:  Acute kidney injury. Chronic kidney disease stage 2. Hypertension.  EXAM: RENAL / URINARY TRACT ULTRASOUND COMPLETE  COMPARISON:  01/26/2008  FINDINGS: Right Kidney:  Length: 10.3 cm. Echogenicity within normal limits. No mass or hydronephrosis visualized. A few tiny less than 1 cm echogenic foci are noted, suspicious for tiny nonobstructive calculi.  Left Kidney:  Length: 9.6 cm. Echogenicity within normal limits. No mass or hydronephrosis visualized. A few tiny less than 1 cm  echogenic foci are noted, suspicious for tiny nonobstructive calculi.  Bladder:  Appears normal for degree of bladder distention.  Other:  Bilateral pleural effusions incidentally noted.  IMPRESSION: No evidence of hydronephrosis. Probable tiny less than 1 cm bilateral nephrolithiasis.  Bilateral pleural effusions incidentally noted.   Electronically Signed   By:  Earle Gell M.D.   On: 12/01/2014 13:35   Dg Chest Port 1 View  12/01/2014   CLINICAL DATA:  Newly diagnosed with CHF. Shortness of breath. Atrial fibrillation.  EXAM: PORTABLE CHEST - 1 VIEW  COMPARISON:  11/29/2014  FINDINGS: Heart is enlarged. There is mild interstitial pulmonary edema slightly improved compared to prior study. Suspect small bilateral pleural effusions. No consolidations.  IMPRESSION: Slight improvement in interstitial edema.   Electronically Signed   By: Nolon Nations M.D.   On: 12/01/2014 10:45   Dg Chest Portable 1 View  11/29/2014   CLINICAL DATA:  Shortness of breath.  Weakness.  EXAM: PORTABLE CHEST - 1 VIEW  COMPARISON:  12/28/2013  FINDINGS: Diffuse abnormal increased interstitial accentuation superimposed on emphysema. The interstitial accentuation is coarse with patchy ground-glass opacities throughout both lungs.  Heart size within normal limits. Atherosclerotic calcification in the wall of the aortic arch. Faint airspace opacities at the lung bases.  IMPRESSION: 1. Bilateral abnormal interstitial accentuation, airway thickening, and indistinct airspace opacities at the lung bases. The appearance favors acute pulmonary edema. Atypical pneumonia is a differential diagnostic consideration. 2. Emphysema.   Electronically Signed   By: Van Clines M.D.   On: 11/29/2014 10:46    ECG  Most recent EKG from 5/1 demonstrates atrial fibrillation with RVR in the 120s.  Telemetry currently shows NSR HR in the 60s  Echocardiogram 11/29/14  Study Conclusions  - Left ventricle: LVEF is approximately 40% witrh  diffuse hypokinesi worse in the inferior and posterior walls The cavity size was mildly dilated. Wall thickness was increased in a pattern of mild LVH. Systolic function was mildly to moderately reduced. The estimated ejection fraction was in the range of 40% to 45%. - Mitral valve: There was mild to moderate regurgitation. - Right ventricle: Systolic function was mildly reduced.     ASSESSMENT AND PLAN  Principal Problem:   Acute respiratory failure with hypoxia Active Problems:   CKD (chronic kidney disease) stage 2, GFR 60-89 ml/min   AKI (acute kidney injury)   Hyperlipidemia   Hypertension   Acute diastolic heart failure   Chronic a-fib   Coagulopathy   Acute diastolic CHF (congestive heart failure)   Dementia due to another medical condition   Weight loss, unintentional   Hard of hearing   Malnutrition of moderate degree   Estimated Creatinine Clearance: 31.3 mL/min (by C-G formula based on Cr of 1.73).   1. PAF: now back in NSR on PO metoprolol. Now asymptomatic. Felt to have failed sotalol therapy. There are concerns that he will not be able to maintain NSR on BB therapy along. General Cardiology questioning if patient is a suitable candidate for Tikosyn. CrCl is 31.3 mL/min. Most recent EKG 12/01/14 showed QTc of 457 ms. Will repeat EKG today to reassess. K is WNL at 4.3. Will check Mg. Dr. Lovena Day to assess and will make final decision regarding Tikosyn.  For now, continue on telemetry, Q6H metoprolol and Coumadin.   Signed, Lyda Jester, PA-C 12/02/2014, 1:02 PM   EP Attending  Patient seen and examined. He has multiple problems and presented with atrial fib with an RVR. He has stage 4-5 renal failure, prolongation of the QT interval and a h/o non-compliance. He is a poor candidate for Tikosyn. With his multiple comorbidities, his long term prognosis is not good. However maintenance of NSR would be beneficial in terms of reducing morbidity. I would  suggest low dose amiodarone, starting 200 mg twice a day  for a week then 200 mg Day thereafter. Will plan to review with his primary cardiologist.  Logan Bosworth.D.

## 2014-12-03 LAB — PROTIME-INR
INR: 1.88 — AB (ref 0.00–1.49)
Prothrombin Time: 21.7 seconds — ABNORMAL HIGH (ref 11.6–15.2)

## 2014-12-03 LAB — HEMOGLOBIN A1C
HEMOGLOBIN A1C: 6 % — AB (ref 4.8–5.6)
MEAN PLASMA GLUCOSE: 126 mg/dL

## 2014-12-03 LAB — CBC
HCT: 36.3 % — ABNORMAL LOW (ref 39.0–52.0)
Hemoglobin: 11.7 g/dL — ABNORMAL LOW (ref 13.0–17.0)
MCH: 28.7 pg (ref 26.0–34.0)
MCHC: 32.2 g/dL (ref 30.0–36.0)
MCV: 89.2 fL (ref 78.0–100.0)
PLATELETS: 338 10*3/uL (ref 150–400)
RBC: 4.07 MIL/uL — AB (ref 4.22–5.81)
RDW: 14.7 % (ref 11.5–15.5)
WBC: 9.2 10*3/uL (ref 4.0–10.5)

## 2014-12-03 LAB — BASIC METABOLIC PANEL
Anion gap: 9 (ref 5–15)
BUN: 36 mg/dL — AB (ref 6–20)
CHLORIDE: 101 mmol/L (ref 101–111)
CO2: 28 mmol/L (ref 22–32)
CREATININE: 1.31 mg/dL — AB (ref 0.61–1.24)
Calcium: 8.7 mg/dL — ABNORMAL LOW (ref 8.9–10.3)
GFR calc Af Amer: 58 mL/min — ABNORMAL LOW (ref >60)
GFR calc non Af Amer: 50 mL/min — ABNORMAL LOW (ref >60)
Glucose, Bld: 89 mg/dL (ref 70–99)
Potassium: 4.1 mmol/L (ref 3.5–5.1)
Sodium: 138 mmol/L (ref 135–145)

## 2014-12-03 LAB — PROCALCITONIN

## 2014-12-03 MED ORDER — METOPROLOL TARTRATE 50 MG PO TABS
50.0000 mg | ORAL_TABLET | Freq: Two times a day (BID) | ORAL | Status: DC
  Administered 2014-12-03 – 2014-12-04 (×3): 50 mg via ORAL
  Filled 2014-12-03 (×4): qty 1

## 2014-12-03 MED ORDER — AMIODARONE HCL 200 MG PO TABS
200.0000 mg | ORAL_TABLET | Freq: Two times a day (BID) | ORAL | Status: DC
  Administered 2014-12-03 – 2014-12-04 (×3): 200 mg via ORAL
  Filled 2014-12-03 (×4): qty 1

## 2014-12-03 MED ORDER — WARFARIN SODIUM 6 MG PO TABS
6.0000 mg | ORAL_TABLET | Freq: Once | ORAL | Status: AC
  Administered 2014-12-03: 6 mg via ORAL
  Filled 2014-12-03: qty 1

## 2014-12-03 NOTE — Progress Notes (Signed)
Message left for patient's son [name] that per Dr. Sherral Hammers- d/c is anticipated for tomorrow. Will follow up with patient and son tomorrow.  Patient will be notified of above as well. Fl2 placed on chart for MD's signature as well as out of facility DNR. Notified patient's nurse- Tiffany of above.  Lorie Phenix. Pauline Good, [place]

## 2014-12-03 NOTE — Progress Notes (Signed)
PROGRESS NOTE    Logan Day IRC:[id number] DOB: [birth year] DOA: 11/29/2014 PCP: Dorian Heckle, MD  HPI/Brief narrative 81 y.o.male hx of CAD with previous stents most recently stent to LCX 10/2012 , PAF on both sotalol, Toprol XL at home and coumadin, hx of embolic CVA, HL, HL, prostate CA, admitted with SOB. He is somewhat of a limited historian, he reports ever since his stroke he has had trouble remembering and expressing himself. He reports he was not able to get his sotalol for 2-3 days at home. While off the medication noted increased palpitations and SOB. He restarted his sotalol but never did quite feel well again. Noted some palpitations during that time.    Assessment/Plan:  Acute Hypoxic respiratory failure - O2 sats in the 80's on room air on admission. - cxr on admission diffuse bilateral interstitial changes. Ct chest pulmonary fibrosis?/bronciectasis. Repeat cxr improved less interstitial changes,  - Hypoxia seems to have resolved and is saturating in the 90s on room air  PAF with RVR - As per son, patient had run out of sotalol for 2-3 days but continued to take his Toprol-XL. - On admission was in sinus rhythm but later went into A. fib with RVR - Treated briefly with Cardizen gtt - Reverted back to NSR overnight 5/2 - Cardiology follow-up appreciated and hadrequested their EPS colleagues to consult given that he was on both sotalol and Toprol-XL at home and currently sotalol held due to renal insufficiency and prolonged QTC and feels that beta blocker alone may not be enough to keep him in sinus rhythm. - INR was therapeutic on 5/1 but 1.6 on 5/2. INR better at 1.88. Coumadin per pharmacy. - EP cardiology input appreciated: Do not think that he is a good Tikosyn candidate, recommended amiodarone 200 MG BID 1 week then 200 MG daily thereafter. - Remains in sinus rhythm.  Acute on chronic combined systolic and diastolic CHF - 12/10/23 echo: LVEF 40-45 % -  Treated initially with IV Lasix which was stopped as patient was not fluid overloaded any longer & creatinine started trending up  Acute on stage III chronic kidney disease - Baseline creatinine probably in the 1.3-1.5 range -  Creatinine peaked at 1.92 on 5/1  - Acute kidney injury was likely precipitated by diuresis-held  - Creatinine improving. Creatinine 5/3:1.3 and back to baseline. - Renal ultrasound without hydronephrosis  - Acute kidney injury resolved.  Bradycardia/QT prolongation  - on admission,  - zithromycin d/ced. sotalol d/ed, DC when necessary Zofran. - interval ekg to monitor QT interval.  - keep mag>2, k>4,  - tsh wnl.  - EPS input appreciated.  Essential hypertension  - Controlled  CAD with previous stents, most recently stent to LCx 10/2012  History of CVA with expressive aphasia - continue home meds asa/statin/betablocker & Coumadin.  FTT/dementia/weight loss:  - nutrition consult/PT//OT/discharge planning - SNF likely 5/4.  Pulmonary fibrosis and bronchiectasis - Seen on CT chest without contrast - Etiology on known/unclear - Outpatient follow-up and may consider pulmonology consultation  Anemia - Stable   Code Status: DNR Family Communication: Discussed with patient's son at bedside on 5/2. None at bedside today. Disposition Plan: Monitor overnight and DC to SNF 5/4.   Consultants:  Cardiology  EPS  Procedures:  None  Antibiotics:  Azithromycin 2 doses-DC'd 4/30  Rocephin 1 dose 4/29  Subjective: Aphasic. Denies complaints. Denies dyspnea, chest pain or palpitations.  Objective: Filed Vitals:   12/03/14 0619 12/03/14 1015 12/03/14 1046 12/03/14 1332  BP: 125/65  120/66 141/65  Pulse: 58 77 66 58  Temp: 98.1 F (36.7 C)   98.3 F (36.8 C)  TempSrc: Oral   Oral  Resp: 17   18  Height:      Weight: 64.7 kg (142 lb 10.2 oz)     SpO2: 95%   98%    Intake/Output Summary (Last 24 hours) at 12/03/14 1730 Last data filed  at 12/03/14 1326  Gross per 24 hour  Intake   1060 ml  Output    975 ml  Net     85 ml   Filed Weights   12/01/14 0510 12/02/14 0525 12/03/14 0619  Weight: 64.2 kg (141 lb 8.6 oz) 63.884 kg (140 lb 13.4 oz) 64.7 kg (142 lb 10.2 oz)     Exam:  General exam: Pleasant elderly male sitting up comfortably in chair working with PT. Respiratory system: Diminished breath sounds in the bases with coarse Velcro-like crackles in the bases. Rest of lung fields clear to auscultation . No increased work of breathing. Cardiovascular system: S1 & S2 heard, RRR. No JVD, murmurs, gallops, clicks or pedal edema. Telemetry: Sinus rhythm in the 60's.  Gastrointestinal system: Abdomen is nondistended, soft and nontender. Normal bowel sounds heard. Central nervous system: Alert and oriented. Aphasic . No focal neurological deficits. Extremities: Symmetric 5 x 5 power.   Data Reviewed: Basic Metabolic Panel:  Recent Labs Lab 11/29/14 1053 11/30/14 0422 12/01/14 0530 12/02/14 0400 12/03/14 0555  NA 138 134* 136 135 138  K 3.8 4.2 3.3* 4.3 4.1  CL 104 101 99* 100* 101  CO2 24 24 30 24 28   GLUCOSE 121* 142* 90 84 89  BUN 18 28* 36* 38* 36*  CREATININE 1.57* 1.71* 1.92* 1.73* 1.31*  CALCIUM 8.8 8.5 8.4* 8.3* 8.7*  MG  --   --  1.9 2.0  --    Liver Function Tests:  Recent Labs Lab 12/01/14 0530  AST 28  ALT 21  ALKPHOS 70  BILITOT 0.3  PROT 6.7  ALBUMIN 2.3*   No results for input(s): LIPASE, AMYLASE in the last 168 hours. No results for input(s): AMMONIA in the last 168 hours. CBC:  Recent Labs Lab 11/29/14 1053 12/01/14 0530 12/02/14 0400 12/03/14 0555  WBC 10.4 12.2* 9.8 9.2  HGB 12.7* 11.8* 12.4* 11.7*  HCT 38.7* 36.4* 37.9* 36.3*  MCV 88.6 88.6 88.6 89.2  PLT 377 348 321 338   Cardiac Enzymes: No results for input(s): CKTOTAL, CKMB, CKMBINDEX, TROPONINI in the last 168 hours. BNP (last 3 results) No results for input(s): PROBNP in the last 8760 hours. CBG: No  results for input(s): GLUCAP in the last 168 hours.  No results found for this or any previous visit (from the past 240 hour(s)).       Studies: No results found.      Scheduled Meds: . ALPRAZolam  0.5 mg Oral QHS  . amiodarone  200 mg Oral BID  . aspirin EC  81 mg Oral Daily  . buPROPion  150 mg Oral BID  . feeding supplement (ENSURE ENLIVE)  237 mL Oral BID PC  . metoprolol tartrate  50 mg Oral BID  . pantoprazole  40 mg Oral Daily  . pravastatin  40 mg Oral QPM  . sertraline  100 mg Oral QPM  . sodium chloride  3 mL Intravenous Q12H  . warfarin  6 mg Oral ONCE-1800  . Warfarin - Pharmacist Dosing Inpatient   Does not apply [phone]  Continuous Infusions: . 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 [facility] Pager [phone]  If 7PM-7AM, please contact night-coverage [email or web address] Password [email or web address] 12/03/2014, 5:30 PM    LOS: 4 days

## 2014-12-03 NOTE — Progress Notes (Signed)
Patient Name: Logan Day Date of Encounter: 12/03/2014  Primary cardiologist: Dr Daneen Schick MD   Principal Problem:   Acute respiratory failure with hypoxia Active Problems:   CKD (chronic kidney disease) stage 2, GFR 60-89 ml/min   AKI (acute kidney injury)   Hyperlipidemia   Hypertension   Acute diastolic heart failure   Chronic a-fib   Coagulopathy   Acute diastolic CHF (congestive heart failure)   Dementia due to another medical condition   Weight loss, unintentional   Hard of hearing   Malnutrition of moderate degree   Acute on chronic combined systolic and diastolic congestive heart failure   Acute renal failure superimposed on stage 3 chronic kidney disease   PAF (paroxysmal atrial fibrillation)    SUBJECTIVE  Denies any CP or SOB. Feels good last night.   CURRENT MEDS . ALPRAZolam  0.5 mg Oral QHS  . amiodarone  200 mg Oral Daily  . aspirin EC  81 mg Oral Daily  . buPROPion  150 mg Oral BID  . feeding supplement (ENSURE ENLIVE)  237 mL Oral BID PC  . metoprolol tartrate  25 mg Oral Q6H  . pantoprazole  40 mg Oral Daily  . pravastatin  40 mg Oral QPM  . sertraline  100 mg Oral QPM  . sodium chloride  3 mL Intravenous Q12H  . Warfarin - Pharmacist Dosing Inpatient   Does not apply q1800    OBJECTIVE  Filed Vitals:   12/02/14 2058 12/02/14 2234 12/03/14 0339 12/03/14 0619  BP:  117/62 145/73 125/65  Pulse: 60 63 67 58  Temp:    98.1 F (36.7 C)  TempSrc:    Oral  Resp:    17  Height:      Weight:    142 lb 10.2 oz (64.7 kg)  SpO2:    95%    Intake/Output Summary (Last 24 hours) at 12/03/14 0840 Last data filed at 12/03/14 0100  Gross per 24 hour  Intake   1660 ml  Output    825 ml  Net    835 ml   Filed Weights   12/01/14 0510 12/02/14 0525 12/03/14 0619  Weight: 141 lb 8.6 oz (64.2 kg) 140 lb 13.4 oz (63.884 kg) 142 lb 10.2 oz (64.7 kg)    PHYSICAL EXAM  General: Pleasant, NAD. Neuro: Alert and oriented X 3. Moves all extremities  spontaneously. Psych: Normal affect. HEENT:  Normal  Neck: Supple without bruits or JVD. Lungs:  Resp regular and unlabored, CTA. Heart: RRR no s3, s4, or murmurs. Abdomen: Soft, non-tender, non-distended, BS + x 4.  Extremities: No clubbing, cyanosis or edema. DP/PT/Radials 2+ and equal bilaterally.  Accessory Clinical Findings  CBC  Recent Labs  12/02/14 0400 12/03/14 0555  WBC 9.8 9.2  HGB 12.4* 11.7*  HCT 37.9* 36.3*  MCV 88.6 89.2  PLT 321 892   Basic Metabolic Panel  Recent Labs  12/01/14 0530 12/02/14 0400 12/03/14 0555  NA 136 135 138  K 3.3* 4.3 4.1  CL 99* 100* 101  CO2 30 24 28   GLUCOSE 90 84 89  BUN 36* 38* 36*  CREATININE 1.92* 1.73* 1.31*  CALCIUM 8.4* 8.3* 8.7*  MG 1.9 2.0  --    Liver Function Tests  Recent Labs  12/01/14 0530  AST 28  ALT 21  ALKPHOS 70  BILITOT 0.3  PROT 6.7  ALBUMIN 2.3*   Hemoglobin A1C  Recent Labs  12/01/14 0530  HGBA1C 6.0*  TELE NSR with HR 60, no recurrence a-fib overnight.     ECG  NSR with HR 60s, prolonged QTc  Echocardiogram 11/29/2014  LV EF: 40% -  45%  ------------------------------------------------------------------- Indications:   CHF - 428.0.  ------------------------------------------------------------------- History:  PMH: CVA. Dementia. Aphasia. Risk factors: Hypertension. Dyslipidemia.  ------------------------------------------------------------------- Study Conclusions  - Left ventricle: LVEF is approximately 40% witrh diffuse hypokinesi worse in the inferior and posterior walls The cavity size was mildly dilated. Wall thickness was increased in a pattern of mild LVH. Systolic function was mildly to moderately reduced. The estimated ejection fraction was in the range of 40% to 45%. - Mitral valve: There was mild to moderate regurgitation. - Right ventricle: Systolic function was mildly reduced.     Radiology/Studies  Ct Chest Wo  Contrast  11/30/2014   CLINICAL DATA:  Hypoxia. History of atrial fibrillation. On Coumadin. History of stroke. Shortness of breath. 30 lb weight loss loss over 6 months.  EXAM: CT CHEST WITHOUT CONTRAST  TECHNIQUE: Multidetector CT imaging of the chest was performed following the standard protocol without IV contrast.  COMPARISON:  Chest x-ray 11/29/2014 and earlier  FINDINGS: Heart: Heart is enlarged. There is dense calcification of the coronary arteries. No pericardial effusion.  Vascular structures: There is dense atherosclerotic calcification of the thoracic aorta. Mildly ectatic ascending aorta without aneurysm.  Mediastinum/thyroid: The visualized portion of the thyroid gland has a normal appearance. Small, nonspecific mediastinal lymph nodes are identified. The largest is seen in the precarinal region, measuring 1.3 cm.  Lungs/Airways: The airways are patent. Small bilateral pleural effusions are noted. Effusion appears slightly loculated at the right lung base. There is thickening of the small airways. There are focal areas of bronchiectasis in the lower lobes bilaterally. Unit areas of consolidation. No suspicious pulmonary nodules.  Upper abdomen: Normal adrenal glands. There nonobstructing intrarenal calculi in the left kidney.  Chest wall/osseous structures: Thoracic spondylosis. No suspicious lytic or blastic lesions are identified.  IMPRESSION: 1. Cardiomegaly and coronary artery disease. 2. Mildly ectatic ascending aorta. 3. Nonspecific mediastinal lymph nodes may be reactive and related to fibrotic changes in the lungs. 4. Pulmonary fibrosis and bronchiectasis. 5. No focal pulmonary abnormality. 6. Small bilateral pleural effusions. 7. Nonobstructing intrarenal calculi left kidney.   Electronically Signed   By: Nolon Nations M.D.   On: 11/30/2014 17:43   US Renal  12/01/2014   CLINICAL DATA:  Acute kidney injury. Chronic kidney disease stage 2. Hypertension.  EXAM: RENAL / URINARY TRACT  ULTRASOUND COMPLETE  COMPARISON:  01/26/2008  FINDINGS: Right Kidney:  Length: 10.3 cm. Echogenicity within normal limits. No mass or hydronephrosis visualized. A few tiny less than 1 cm echogenic foci are noted, suspicious for tiny nonobstructive calculi.  Left Kidney:  Length: 9.6 cm. Echogenicity within normal limits. No mass or hydronephrosis visualized. A few tiny less than 1 cm echogenic foci are noted, suspicious for tiny nonobstructive calculi.  Bladder:  Appears normal for degree of bladder distention.  Other:  Bilateral pleural effusions incidentally noted.  IMPRESSION: No evidence of hydronephrosis. Probable tiny less than 1 cm bilateral nephrolithiasis.  Bilateral pleural effusions incidentally noted.   Electronically Signed   By: Earle Gell M.D.   On: 12/01/2014 13:35   Dg Chest Port 1 View  12/01/2014   CLINICAL DATA:  Newly diagnosed with CHF. Shortness of breath. Atrial fibrillation.  EXAM: PORTABLE CHEST - 1 VIEW  COMPARISON:  11/29/2014  FINDINGS: Heart is enlarged. There is mild interstitial pulmonary edema slightly  improved compared to prior study. Suspect small bilateral pleural effusions. No consolidations.  IMPRESSION: Slight improvement in interstitial edema.   Electronically Signed   By: Nolon Nations M.D.   On: 12/01/2014 10:45   Dg Chest Portable 1 View  11/29/2014   CLINICAL DATA:  Shortness of breath.  Weakness.  EXAM: PORTABLE CHEST - 1 VIEW  COMPARISON:  12/28/2013  FINDINGS: Diffuse abnormal increased interstitial accentuation superimposed on emphysema. The interstitial accentuation is coarse with patchy ground-glass opacities throughout both lungs.  Heart size within normal limits. Atherosclerotic calcification in the wall of the aortic arch. Faint airspace opacities at the lung bases.  IMPRESSION: 1. Bilateral abnormal interstitial accentuation, airway thickening, and indistinct airspace opacities at the lung bases. The appearance favors acute pulmonary edema. Atypical  pneumonia is a differential diagnostic consideration. 2. Emphysema.   Electronically Signed   By: Van Clines M.D.   On: 11/29/2014 10:46    ASSESSMENT AND PLAN  81 yo male with h/o CAD with previous stents most recently stent to LCx 10/2012, PAF on sotalol and coumadin, h/o embolic CVA, CKD, HTN, HLD, prostate CA admitted for SOB. Pt states he missed 2-3 days of sotalol and noted increased palpitation and SOB while off the medication. Although he restarted sotalol, symptom did not improve. After admission, his sotalol was stopped due to prolonged QTc and bradycardia. Metoprolol increased. EP consulted for guidance, recommended low dose amiodarone.   1. PAF - now in NSR - off sotalol for 2-3 days, began to develop palpitation and SOB in the mean time, restarted prior to admission - was in SR on admit but later went into afib with RVR, now back in NSR overnight - on 25mg  toprol XL along with 120mg  BID of sotalol at home. Currently on lopressor 25mg  q6hr, will consolidate to 50mg  BID, BP does not allow uptitrate.  - EP consulted yesterday, did not think he is a good tikosyn candidate, recommended amiodarone 200mg  BID for 1 week, then 200mg  daily thereafter. Otherwise stable for discharge from cardiac perspective. OT recommended SNF.   2. Acute on chronic combined systolic and diastolic HF - 9/67/89 echo: LVEF 40-45% (LVEF 50% by echo in 2012), mild to mod MR, mild RV dysfunction, diastolic function not described - IV lasix stopped as patient no longer appears to be fluid overloaded when cardiology consulted and Cr trending up  3. Acute on chronic renal insufficiency 4. CAD with previous stents most recently stent to LCx 09/8099 5. h/o embolic CVA 6. HTN 7. HLD 8. prostate CA  Signed, Woodward Ku Pager: [phone]  Patient examined chart reviewed Remains in NSR  See EP note regarding amiodarone instead of sotolol Ok to go to  [employer]

## 2014-12-03 NOTE — Progress Notes (Signed)
Physical Therapy Treatment Patient Details Name: Logan Day MRN: [id number] DOB: Jul 29, 1933 Today's Date: 12-17-14    History of Present Illness Acute respiratory failure with hypoxia felt to be secondary to acute diastolic heart failure; poor historian; history of dementia, afib, htn    PT Comments    Pt pleasant and confused disoriented to time, safety and unable to answer basic questions for home safety. Pt with relatively steady gait continuing to require guarding for safety. Pt will benefit from SNF at D/C with probable long term ALF. Will continue to follow.   Follow Up Recommendations  SNF;Supervision/Assistance - 24 hour     Equipment Recommendations  None recommended by PT    Recommendations for Other Services       Precautions / Restrictions Precautions Precautions: Fall    Mobility  Bed Mobility Overal bed mobility: Independent                Transfers Overall transfer level: Needs assistance Equipment used: None   Sit to Stand: Min guard         General transfer comment: guarding for safety as pt impulsive  Ambulation/Gait Ambulation/Gait assistance: Min guard Ambulation Distance (Feet): 500 Feet Assistive device: None Gait Pattern/deviations: Step-through pattern;Decreased stride length   Gait velocity interpretation: at or above normal speed for age/gender General Gait Details: 2 periods of LOB with gait with tactile cues to correct   Stairs            Wheelchair Mobility    Modified Rankin (Stroke Patients Only)       Balance                                    Cognition Arousal/Alertness: Awake/alert Behavior During Therapy: Impulsive Overall Cognitive Status: History of cognitive impairments - at baseline       Memory: Decreased recall of precautions;Decreased short-term memory Following Commands: Follows one step commands inconsistently Safety/Judgement: Decreased awareness of safety;Decreased  awareness of deficits   Problem Solving: Slow processing;Decreased initiation;Difficulty sequencing;Requires verbal cues;Requires tactile cues      Exercises General Exercises - Lower Extremity Long Arc Quad: AROM;Seated;Both;20 reps Hip Flexion/Marching: AROM;Seated;Both;15 reps    General Comments        Pertinent Vitals/Pain Pain Assessment: No/denies pain  HR 77    Home Living                      Prior Function            PT Goals (current goals can now be found in the care plan section) Progress towards PT goals: Progressing toward goals    Frequency  Min 2X/week    PT Plan Discharge plan needs to be updated;Frequency needs to be updated    Co-evaluation             End of Session   Activity Tolerance: Patient tolerated treatment well Patient left: in chair;with call bell/phone within reach;with chair alarm set     Time: 3151-7616 PT Time Calculation (min) (ACUTE ONLY): 19 min  Charges:  $Gait Training: 8-22 mins                    G Codes:      Melford Aase Dec 17, 2014, 10:17 AM Elwyn Reach, [place]

## 2014-12-03 NOTE — Progress Notes (Signed)
ANTICOAGULATION CONSULT NOTE - FOLLOW UP  Pharmacy Consult:  Coumadin Indication: atrial fibrillation  No Known Allergies  Vital Signs: Temp: 98.1 F (36.7 C) (05/03 0619) Temp Source: Oral (05/03 0619) BP: 125/65 mmHg (05/03 0619) Pulse Rate: 77 (05/03 1015)  Labs:  Recent Labs  12/01/14 0530 12/02/14 0400 12/03/14 0555  HGB 11.8* 12.4* 11.7*  HCT 36.4* 37.9* 36.3*  PLT 348 321 338  LABPROT 25.4* 19.8* 21.7*  INR 2.29* 1.66* 1.88*  CREATININE 1.92* 1.73* 1.31*    Estimated Creatinine Clearance: 41.8 mL/min (by C-G formula based on Cr of 1.31).   Assessment: 17 YOM admitted on 11/29/2014 with worsening SOB to continue Coumadin for history of Afib.  Patient's INR on admit was supra-therapeutic and Coumadin was held for 2 days, then resumed on 12/01/14.  INR currently 1.88.  Patient's home dose of warfarin is 4mg  daily except 2mg  on Tuesdays and Fridays.  Hgb and plts stable. No bleeding noted  Note cardiology has cleared for discharge on amiodarone. Amiodarone can affect INR.  Goal of Therapy:  INR 2-3  Plan:  - Recommend warfarin 6mg  x1 today (5/3), and then resume home dose of 4mg  daily except 2mg  on Tuesdays and Fridays starting 5/4. INR check on Thursday or Friday to check for effects of amiodarone - Daily PT / INR while here in hospital  Reizy Dunlow D. Durwin Davisson, PharmD, BCPS Clinical Pharmacist Pager: [phone] 12/03/2014 10:26 AM

## 2014-12-03 NOTE — Clinical Social Work Placement (Signed)
   CLINICAL SOCIAL WORK PLACEMENT  NOTE  Date:  12/03/2014  Patient Details  Name: KHARI MALLY MRN: [id number] Date of Birth: 11/08/1932  Clinical Social Work is seeking post-discharge placement for this patient at the [place] (SNF level of care ) level of care (*CSW will initial, date and re-position this form in  chart as items are completed):  Yes   Patient/family provided with [place] Work Department's list of facilities offering this level of care within the geographic area requested by the patient (or if unable, by the patient's family).  Yes   Patient/family informed of their freedom to choose among providers that offer the needed level of care, that participate in Medicare, Medicaid or managed care program needed by the patient, have an available bed and are willing to accept the patient.  Yes   Patient/family informed of [facility]'s ownership interest in [facility] and [facility], as well as of the fact that they are under no obligation to receive care at these facilities.  PASRR submitted to EDS on       PASRR number received on       Existing PASRR number confirmed on 12/03/14     FL2 transmitted to all facilities in geographic area requested by pt/family on 12/03/14     FL2 transmitted to all facilities within larger geographic area on       Patient informed that his/her managed care company has contracts with or will negotiate with certain facilities, including the following:   (NA- Has UHC- Medicare Complete)     Yes   Patient/family informed of bed offers received.  Patient chooses bed at  ([facility])     Physician recommends and patient chooses bed at      Patient to be transferred to [facility] [place] on 12/03/14.  Patient to be transferred to facility by Ambulance  [facility])     Patient family notified on   of transfer.  Name of family member notified:         PHYSICIAN Please prepare priority discharge summary, including medications, Please sign FL2, Please sign DNR     Additional Comment:   _______________________________________________ Williemae Area, LCSW 12/03/2014, 5:26 PM

## 2014-12-04 LAB — CBC
HCT: 38.9 % — ABNORMAL LOW (ref 39.0–52.0)
HEMOGLOBIN: 12.5 g/dL — AB (ref 13.0–17.0)
MCH: 28.8 pg (ref 26.0–34.0)
MCHC: 32.1 g/dL (ref 30.0–36.0)
MCV: 89.6 fL (ref 78.0–100.0)
Platelets: 402 10*3/uL — ABNORMAL HIGH (ref 150–400)
RBC: 4.34 MIL/uL (ref 4.22–5.81)
RDW: 14.7 % (ref 11.5–15.5)
WBC: 9.8 10*3/uL (ref 4.0–10.5)

## 2014-12-04 LAB — PROTIME-INR
INR: 2.26 — AB (ref 0.00–1.49)
Prothrombin Time: 25.1 seconds — ABNORMAL HIGH (ref 11.6–15.2)

## 2014-12-04 MED ORDER — AMIODARONE HCL 200 MG PO TABS: 200.0000 mg | ORAL_TABLET | Freq: Two times a day (BID) | ORAL | Status: DC

## 2014-12-04 MED ORDER — WARFARIN SODIUM 4 MG PO TABS: 4.0000 mg | ORAL_TABLET | Freq: Once | ORAL | Status: DC

## 2014-12-04 MED ORDER — WARFARIN SODIUM 4 MG PO TABS
4.0000 mg | ORAL_TABLET | Freq: Once | ORAL | Status: DC
  Filled 2014-12-04: qty 1

## 2014-12-04 MED ORDER — METOPROLOL TARTRATE 50 MG PO TABS: 50.0000 mg | ORAL_TABLET | Freq: Two times a day (BID) | ORAL | Status: DC

## 2014-12-04 MED ORDER — ALPRAZOLAM 0.5 MG PO TABS: 0.5000 mg | ORAL_TABLET | Freq: Every day | ORAL | Status: DC

## 2014-12-04 MED ORDER — ENSURE ENLIVE PO LIQD: 237.0000 mL | Freq: Two times a day (BID) | ORAL | Status: DC

## 2014-12-04 NOTE — Progress Notes (Signed)
Patient contacting son, requesting a ride home because "they are throwing me out and I need a place to stay."  Pt. Appears confused at this time and is belligerent / was reportedly cursing at night staff.

## 2014-12-04 NOTE — Progress Notes (Signed)
ANTICOAGULATION CONSULT NOTE - FOLLOW UP  Pharmacy Consult:  Coumadin Indication: atrial fibrillation  No Known Allergies  Vital Signs: Temp: 98 F (36.7 C) (05/04 0618) Temp Source: Oral (05/04 0618) BP: 124/56 mmHg (05/04 0618) Pulse Rate: 68 (05/04 0618)  Labs:  Recent Labs  12/02/14 0400 12/03/14 0555 12/04/14 0326  HGB 12.4* 11.7* 12.5*  HCT 37.9* 36.3* 38.9*  PLT 321 338 402*  LABPROT 19.8* 21.7* 25.1*  INR 1.66* 1.88* 2.26*  CREATININE 1.73* 1.31*  --     Estimated Creatinine Clearance: 41.4 mL/min (by C-G formula based on Cr of 1.31).   Assessment: 57 YOM admitted on 11/29/2014 with worsening SOB to continue Coumadin for history of Afib.  Patient's INR on admit was supra-therapeutic and Coumadin was held for 2 days, then resumed on 12/01/14.  INR currently 2.26.  Patient's home dose of warfarin is 4mg  daily except 2mg  on Tuesdays and Fridays.  Hgb and plts stable. No bleeding noted  Note cardiology has cleared for discharge on amiodarone. Amiodarone can affect INR.  Goal of Therapy:  INR 2-3  Plan:  - Resume home dose of 4mg  daily except 2mg  on Tuesdays and Fridays starting 5/4. INR check on Thursday or Friday to check for effects of amiodarone - Daily PT / INR while here in hospital  Albertina Parr, PharmD., BCPS Clinical Pharmacist Pager [phone]

## 2014-12-04 NOTE — Progress Notes (Addendum)
Patient ID: Logan Day, male   DOB: [birth year], 81 y.o.   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 Seems confused this am  " not sure where I'm supposed to go"    CURRENT MEDS . ALPRAZolam  0.5 mg Oral QHS  . amiodarone  200 mg Oral BID  . aspirin EC  81 mg Oral Daily  . buPROPion  150 mg Oral BID  . feeding supplement (ENSURE ENLIVE)  237 mL Oral BID PC  . metoprolol tartrate  50 mg Oral BID  . pantoprazole  40 mg Oral Daily  . pravastatin  40 mg Oral QPM  . sertraline  100 mg Oral QPM  . sodium chloride  3 mL Intravenous Q12H  . Warfarin - Pharmacist Dosing Inpatient   Does not apply q1800    OBJECTIVE  Filed Vitals:   12/03/14 1046 12/03/14 1332 12/03/14 2135 12/04/14 0618  BP: 120/66 141/65 122/71 124/56  Pulse: 66 58 106 68  Temp:  98.3 F (36.8 C)  98 F (36.7 C)  TempSrc:  Oral  Oral  Resp:  18 18 18   Height:      Weight:    141 lb 0.1 oz (63.961 kg)  SpO2:  98% 96% 98%    Intake/Output Summary (Last 24 hours) at 12/04/14 0807 Last data filed at 12/04/14 0335  Gross per 24 hour  Intake   1240 ml  Output   1651 ml  Net   -411 ml   Filed Weights   12/02/14 0525 12/03/14 0619 12/04/14 0618  Weight: 140 lb 13.4 oz (63.884  kg) 142 lb 10.2 oz (64.7 kg) 141 lb 0.1 oz (63.961 kg)    PHYSICAL EXAM  General: Pleasant, NAD. Neuro: Alert and oriented X 3. Moves all extremities spontaneously. Psych: Normal affect. HEENT:  Normal  Neck: Supple without bruits or JVD. Lungs:  Resp regular and unlabored, CTA. Heart: RRR no s3, s4, or murmurs. Abdomen: Soft, non-tender, non-distended, BS + x 4.  Extremities: No clubbing, cyanosis or edema. DP/PT/Radials 2+ and equal bilaterally.  Accessory Clinical Findings  CBC  Recent Labs  12/03/14 0555 12/04/14 0326  WBC 9.2 9.8  HGB 11.7* 12.5*  HCT 36.3* 38.9*  MCV 89.2 89.6  PLT 338 644*   Basic Metabolic Panel  Recent Labs  12/02/14 0400 12/03/14 0555  NA 135 138  K 4.3 4.1  CL 100* 101  CO2 24 28  GLUCOSE 84 89  BUN 38* 36*  CREATININE 1.73* 1.31*  CALCIUM 8.3* 8.7*  MG 2.0  --    Liver Function Tests No results for input(s): AST, ALT, ALKPHOS, BILITOT, PROT, ALBUMIN in the last 72 hours. Hemoglobin A1C No results for input(s): HGBA1C in  the last 72 hours.  TELE NSR with HR 60, no recurrence a-fib overnight.     ECG  NSR with HR 60s, prolonged QTc  Echocardiogram 11/29/2014  LV EF: 40% -  45%  ------------------------------------------------------------------- Indications:   CHF - 428.0.  ------------------------------------------------------------------- History:  PMH: CVA. Dementia. Aphasia. Risk factors: Hypertension. Dyslipidemia.  ------------------------------------------------------------------- Study Conclusions  - Left ventricle: LVEF is approximately 40% witrh diffuse hypokinesi worse in the inferior and posterior walls The cavity size was mildly dilated. Wall thickness was increased in a pattern of mild LVH. Systolic function was mildly to moderately reduced. The estimated ejection fraction was in the range of 40% to 45%. - Mitral valve: There was mild to moderate regurgitation. - Right ventricle:  Systolic function was mildly reduced.     Radiology/Studies  Ct Chest Wo Contrast  11/30/2014   CLINICAL DATA:  Hypoxia. History of atrial fibrillation. On Coumadin. History of stroke. Shortness of breath. 30 lb weight loss loss over 6 months.  EXAM: CT CHEST WITHOUT CONTRAST  TECHNIQUE: Multidetector CT imaging of the chest was performed following the standard protocol without IV contrast.  COMPARISON:  Chest x-ray 11/29/2014 and earlier  FINDINGS: Heart: Heart is enlarged. There is dense calcification of the coronary arteries. No pericardial effusion.  Vascular structures: There is dense atherosclerotic calcification of the thoracic aorta. Mildly ectatic ascending aorta without aneurysm.  Mediastinum/thyroid: The visualized portion of the thyroid gland has a normal appearance. Small, nonspecific mediastinal lymph nodes are identified. The largest is seen in the precarinal region, measuring 1.3 cm.  Lungs/Airways: The airways are patent. Small bilateral pleural effusions are noted. Effusion appears slightly loculated at the right lung base. There is thickening of the small airways. There are focal areas of bronchiectasis in the lower lobes bilaterally. Unit areas of consolidation. No suspicious pulmonary nodules.  Upper abdomen: Normal adrenal glands. There nonobstructing intrarenal calculi in the left kidney.  Chest wall/osseous structures: Thoracic spondylosis. No suspicious lytic or blastic lesions are identified.  IMPRESSION: 1. Cardiomegaly and coronary artery disease. 2. Mildly ectatic ascending aorta. 3. Nonspecific mediastinal lymph nodes may be reactive and related to fibrotic changes in the lungs. 4. Pulmonary fibrosis and bronchiectasis. 5. No focal pulmonary abnormality. 6. Small bilateral pleural effusions. 7. Nonobstructing intrarenal calculi left kidney.   Electronically Signed   By: Nolon Nations M.D.   On: 11/30/2014 17:43   US Renal  12/01/2014   CLINICAL DATA:  Acute kidney injury.  Chronic kidney disease stage 2. Hypertension.  EXAM: RENAL / URINARY TRACT ULTRASOUND COMPLETE  COMPARISON:  01/26/2008  FINDINGS: Right Kidney:  Length: 10.3 cm. Echogenicity within normal limits. No mass or hydronephrosis visualized. A few tiny less than 1 cm echogenic foci are noted, suspicious for tiny nonobstructive calculi.  Left Kidney:  Length: 9.6 cm. Echogenicity within normal limits. No mass or hydronephrosis visualized. A few tiny less than 1 cm echogenic foci are noted, suspicious for tiny nonobstructive calculi.  Bladder:  Appears normal for degree of bladder distention.  Other:  Bilateral pleural effusions incidentally noted.  IMPRESSION: No evidence of hydronephrosis. Probable tiny less than 1 cm bilateral nephrolithiasis.  Bilateral pleural effusions incidentally noted.   Electronically Signed   By: Earle Gell M.D.   On: 12/01/2014 13:35   Dg Chest Port 1 View  12/01/2014   CLINICAL DATA:  Newly diagnosed with CHF. Shortness of breath. Atrial fibrillation.  EXAM: PORTABLE CHEST - 1 VIEW  COMPARISON:  11/29/2014  FINDINGS: Heart is enlarged. There is  mild interstitial pulmonary edema slightly improved compared to prior study. Suspect small bilateral pleural effusions. No consolidations.  IMPRESSION: Slight improvement in interstitial edema.   Electronically Signed   By: Nolon Nations M.D.   On: 12/01/2014 10:45   Dg Chest Portable 1 View  11/29/2014   CLINICAL DATA:  Shortness of breath.  Weakness.  EXAM: PORTABLE CHEST - 1 VIEW  COMPARISON:  12/28/2013  FINDINGS: Diffuse abnormal increased interstitial accentuation superimposed on emphysema. The interstitial accentuation is coarse with patchy ground-glass opacities throughout both lungs.  Heart size within normal limits. Atherosclerotic calcification in the wall of the aortic arch. Faint airspace opacities at the lung bases.  IMPRESSION: 1. Bilateral abnormal interstitial accentuation, airway thickening, and indistinct airspace opacities at  the lung bases. The appearance favors acute pulmonary edema. Atypical pneumonia is a differential diagnostic consideration. 2. Emphysema.   Electronically Signed   By: Van Clines M.D.   On: 11/29/2014 10:46    ASSESSMENT AND PLAN  81 yo male with h/o CAD with previous stents most recently stent to LCx 10/2012, PAF on sotalol and coumadin, h/o embolic CVA, CKD, HTN, HLD, prostate CA admitted for SOB. Pt states he missed 2-3 days of sotalol and noted increased palpitation and SOB while off the medication. Although he restarted sotalol, symptom did not improve. After admission, his sotalol was stopped due to prolonged QTc and bradycardia. Metoprolol increased. EP consulted for guidance, recommended low dose amiodarone.   1. PAF - now in NSR - off sotalol for 2-3 days, began to develop palpitation and SOB in the mean time, restarted prior to admission - was in SR on admit but later went into afib with RVR, now back in NSR overnight - on 25mg  toprol XL along with 120mg  BID of sotalol at home. Currently on lopressor 25mg  q6hr, will consolidate to 50mg  BID, BP does not allow uptitrate.  - EP consulted  did not think he is a good tikosyn candidate, recommended amiodarone 200mg  BID for 1 week, then 200mg  daily thereafter. Otherwise stable for discharge from cardiac perspective. OT recommended SNF.   2. Acute on chronic combined systolic and diastolic HF - 0/92/95 echo: LVEF 40-45% (LVEF 50% by echo in 2012), mild to mod MR, mild RV dysfunction, diastolic function not described - IV lasix stopped as patient no longer appears to be fluid overloaded when cardiology consulted and Cr trending up  3. Acute on chronic renal insufficiency 4. CAD with previous stents most recently stent to LCx 01/4733 5. h/o embolic CVA 6. HTN 7. HLD 8. prostate CA  To SNF  Will sign off   Jenkins Rouge

## 2014-12-04 NOTE — Progress Notes (Signed)
The patient had bouts of confusion overnight and cussed at the RN briefly one time.  Upon returning to the room a few minutes later, the patient's agitation had subsided.  He did not have any complaints of pain and his VS were stable overnight.  He did not receive any PRN medications.

## 2014-12-04 NOTE — Discharge Summary (Signed)
Physician Discharge Summary  Logan Day VOJ:[id number] DOB: May 01, 1933 DOA: 11/29/2014  PCP: Dorian Heckle, MD  Admit date: 11/29/2014 Discharge date: 12/04/2014  Time spent: Greater than 30 minutes  Recommendations for Outpatient Follow-up:  1. M.D. at SNF in 2 days with repeat labs (CBC, BMP, PT & INR). Coumadin dose adjustment as required. Next lab draw on 12/06/14. 2. Dr. Daneen Schick, Cardiology in 2-3 weeks 3. Dr. Dorian Heckle, PCP: Follow-up upon discharge from SNF. 4. May consider outpatient pulmonology consultation for evaluation and follow-up of pulmonary fibrosis reported on CT chest. 5. Microscopic hematuria on urine microscopy 12/01/14: Consider outpatient urology consultation.  Discharge Diagnoses:  Principal Problem:   Acute respiratory failure with hypoxia Active Problems:   CKD (chronic kidney disease) stage 2, GFR 60-89 ml/min   AKI (acute kidney injury)   Hyperlipidemia   Hypertension   Acute diastolic heart failure   Chronic a-fib   Coagulopathy   Acute diastolic CHF (congestive heart failure)   Dementia due to another medical condition   Weight loss, unintentional   Hard of hearing   Malnutrition of moderate degree   Acute on chronic combined systolic and diastolic congestive heart failure   Acute renal failure superimposed on stage 3 chronic kidney disease   PAF (paroxysmal atrial fibrillation)   Discharge Condition: Improved & Stable  Diet recommendation: Heart healthy diet.  Filed Weights   12/02/14 0525 12/03/14 0619 12/04/14 0618  Weight: 63.884 kg (140 lb 13.4 oz) 64.7 kg (142 lb 10.2 oz) 63.961 kg (141 lb 0.1 oz)    History of present illness:  81 y.o.male hx of CAD with previous stents most recently stent to LCX 10/2012 , PAF on both sotalol, Toprol XL at home and coumadin, hx of embolic CVA, HL, HL, prostate CA, admitted with SOB. He is somewhat of a limited historian, he reports ever since his stroke he has had trouble remembering and  expressing himself. He reports he was not able to get his sotalol for 2-3 days at home. While off the medication noted increased palpitations and SOB. He restarted his sotalol but never did quite feel well again. Noted some palpitations during that time.   Hospital Course:   Acute Hypoxic respiratory failure - O2 sats in the 80's on room air on admission. - cxr on admission diffuse bilateral interstitial changes. Ct chest pulmonary fibrosis?/bronciectasis. Repeat cxr improved less interstitial changes,  - Hypoxia has resolved  PAF with RVR - As per son, patient had run out of sotalol for 2-3 days but continued to take his Toprol-XL. - On admission was in sinus rhythm but later went into A. fib with RVR - Treated briefly with Cardizen gtt - Reverted back to NSR overnight 5/2 - Cardiology follow-up appreciated and requested their EPS colleagues to consult given that he was on both sotalol and Toprol-XL at home and currently sotalol held due to renal insufficiency and prolonged QTC and feels that beta blocker alone may not be enough to keep him in sinus rhythm. - EP cardiology input appreciated: Do not think that he is a good Tikosyn candidate, recommended amiodarone 200 MG BID 1 week then 200 MG daily thereafter. - Patient remained in sinus rhythm for 2 days and then reverted to A. fib overnight 5/3. This morning his ventricular rate is in the 100s-90s. Discussed with rounding cardiologist Dr. Jenkins Rouge who recommends discharging on current medication regimen and will arrange outpatient follow-up in a couple of weeks. -INR therapeutic today at 2.26 and he  will receive today's dose of Coumadin prior to discharge.   Acute on chronic combined systolic and diastolic CHF - 3/82/50 echo: LVEF 40-45 % - Treated initially with IV Lasix which was stopped as patient was not fluid overloaded any longer & creatinine started trending up  Acute on stage III chronic kidney disease - Baseline creatinine  probably in the 1.3-1.5 range - Creatinine peaked at 1.92 on 5/1  - Acute kidney injury was likely precipitated by diuresis-held  - Creatinine improving. Creatinine 5/3:1.3 and back to baseline. - Renal ultrasound without hydronephrosis  - Acute kidney injury resolved.  Bradycardia/QT prolongation  - on admission,  - zithromycin d/ced. sotalol d/ed, DC when necessary Zofran. - interval ekg to monitor QT interval.  - keep mag>2, k>4,  - tsh wnl.  - EPS input appreciated.  Essential hypertension  - Controlled  CAD with previous stents, most recently stent to LCx 10/2012  History of CVA with expressive aphasia - continue home meds asa/statin/betablocker & Coumadin.  FTT/dementia/weight loss:  - nutrition consult/PT//OT/discharge planning - Patient being discharged to SNF for therapies.  Pulmonary fibrosis and bronchiectasis - Seen on CT chest without contrast - Etiology on known/unclear - Outpatient follow-up and may consider pulmonology consultation  Microscopic hematuria - Consider outpatient urology consultation  Anemia - Stable  DO NOT RESUSCITATE  Consultants:  Cardiology  EPS  Procedures:  2-D echo 11/29/14: Study Conclusions  - Left ventricle: LVEF is approximately 40% witrh diffuse hypokinesi worse in the inferior and posterior walls The cavity size was mildly dilated. Wall thickness was increased in a pattern of mild LVH. Systolic function was mildly to moderately reduced. The estimated ejection fraction was in the range of 40% to 45%. - Mitral valve: There was mild to moderate regurgitation. - Right ventricle: Systolic function was mildly reduced.   Discharge Exam:  Complaints:  Denies complaints. Denies palpitations, dyspnea, chest pain. Pleasantly confused. As per nursing, reverted to A. fib overnight but patient refused to wear telemetry until M.D. requested during visit and he agreed. Patient anxious to discharge from  hospital.  Filed Vitals:   12/03/14 1332 12/03/14 2135 12/04/14 0618 12/04/14 1046  BP: 141/65 122/71 124/56   Pulse: 58 106 68 101  Temp: 98.3 F (36.8 C)  98 F (36.7 C)   TempSrc: Oral  Oral   Resp: 18 18 18    Height:      Weight:   63.961 kg (141 lb 0.1 oz)   SpO2: 98% 96% 98%     General exam: Pleasant elderly male sitting up comfortably in bed. Respiratory system: Diminished breath sounds in the bases with coarse Velcro-like crackles in the bases, R>L. Rest of lung fields clear to auscultation . No increased work of breathing. Cardiovascular system: S1 & S2 heard, RRR. No JVD, murmurs, gallops, clicks or pedal edema. Telemetry:A. fib with VR in 90s-100s.  Gastrointestinal system: Abdomen is nondistended, soft and nontender. Normal bowel sounds heard. Central nervous system: Alert and oriented only to self. Aphasic . No focal neurological deficits.Follow simple instructions.  Extremities: Symmetric 5 x 5 power.  Discharge Instructions      Discharge Instructions    (HEART FAILURE PATIENTS) Call MD:  Anytime you have any of the following symptoms: 1) 3 pound weight gain in 24 hours or 5 pounds in 1 week 2) shortness of breath, with or without a dry hacking cough 3) swelling in the hands, feet or stomach 4) if you have to sleep on extra pillows at night in  order to breathe.    Complete by:  As directed      Call MD for:  difficulty breathing, headache or visual disturbances    Complete by:  As directed      Call MD for:  extreme fatigue    Complete by:  As directed      Call MD for:  persistant dizziness or light-headedness    Complete by:  As directed      Call MD for:    Complete by:  As directed   Heart racing or palpitations.     Diet - low sodium heart healthy    Complete by:  As directed      Increase activity slowly    Complete by:  As directed             Medication List    STOP taking these medications        metoprolol succinate 25 MG 24 hr tablet   Commonly known as:  TOPROL XL     sotalol 120 MG tablet  Commonly known as:  BETAPACE      TAKE these medications        ALPRAZolam 0.5 MG tablet  Commonly known as:  XANAX  Take 1 tablet (0.5 mg total) by mouth at bedtime.     amiodarone 200 MG tablet  Commonly known as:  PACERONE  Take 1 tablet (200 mg total) by mouth 2 (two) times daily. Take 200 mg 2 times daily 1 week followed by 200 mg daily.     aspirin EC 81 MG tablet  Take 1 tablet (81 mg total) by mouth daily.     buPROPion 150 MG 12 hr tablet  Commonly known as:  WELLBUTRIN SR  Take 150 mg by mouth 2 (two) times daily.     feeding supplement (ENSURE ENLIVE) Liqd  Take 237 mLs by mouth 2 (two) times daily after a meal.     metoprolol 50 MG tablet  Commonly known as:  LOPRESSOR  Take 1 tablet (50 mg total) by mouth 2 (two) times daily.     multivitamin with minerals Tabs tablet  Take 1 tablet by mouth every evening.     NITROSTAT 0.4 MG SL tablet  Generic drug:  nitroGLYCERIN  Place 0.4 mg under the tongue every 5 (five) minutes as needed for chest pain.     omeprazole 20 MG capsule  Commonly known as:  PRILOSEC  Take 20 mg by mouth every morning.     oxymetazoline 0.05 % nasal spray  Commonly known as:  AFRIN  Place 1 spray into both nostrils 2 (two) times daily as needed for congestion.     pravastatin 40 MG tablet  Commonly known as:  PRAVACHOL  Take 40 mg by mouth every evening.     sertraline 100 MG tablet  Commonly known as:  ZOLOFT  Take 100 mg by mouth every evening.     traMADol 50 MG tablet  Commonly known as:  ULTRAM  Take 50 mg by mouth 2 (two) times daily as needed for pain.     warfarin 4 MG tablet  Commonly known as:  COUMADIN  Take 2-4 mg by mouth every evening. Take half of a tablet (2mg ) on Tuesdays & Fridays. On all other days take a whole tablet (4mg ).          The results of significant diagnostics from this hospitalization (including imaging, microbiology, ancillary  and laboratory) are listed below for reference.  Significant Diagnostic Studies: Ct Chest Wo Contrast  11/30/2014   CLINICAL DATA:  Hypoxia. History of atrial fibrillation. On Coumadin. History of stroke. Shortness of breath. 30 lb weight loss loss over 6 months.  EXAM: CT CHEST WITHOUT CONTRAST  TECHNIQUE: Multidetector CT imaging of the chest was performed following the standard protocol without IV contrast.  COMPARISON:  Chest x-ray 11/29/2014 and earlier  FINDINGS: Heart: Heart is enlarged. There is dense calcification of the coronary arteries. No pericardial effusion.  Vascular structures: There is dense atherosclerotic calcification of the thoracic aorta. Mildly ectatic ascending aorta without aneurysm.  Mediastinum/thyroid: The visualized portion of the thyroid gland has a normal appearance. Small, nonspecific mediastinal lymph nodes are identified. The largest is seen in the precarinal region, measuring 1.3 cm.  Lungs/Airways: The airways are patent. Small bilateral pleural effusions are noted. Effusion appears slightly loculated at the right lung base. There is thickening of the small airways. There are focal areas of bronchiectasis in the lower lobes bilaterally. Unit areas of consolidation. No suspicious pulmonary nodules.  Upper abdomen: Normal adrenal glands. There nonobstructing intrarenal calculi in the left kidney.  Chest wall/osseous structures: Thoracic spondylosis. No suspicious lytic or blastic lesions are identified.  IMPRESSION: 1. Cardiomegaly and coronary artery disease. 2. Mildly ectatic ascending aorta. 3. Nonspecific mediastinal lymph nodes may be reactive and related to fibrotic changes in the lungs. 4. Pulmonary fibrosis and bronchiectasis. 5. No focal pulmonary abnormality. 6. Small bilateral pleural effusions. 7. Nonobstructing intrarenal calculi left kidney.   Electronically Signed   By: Nolon Nations M.D.   On: 11/30/2014 17:43   US Renal  12/01/2014   CLINICAL DATA:   Acute kidney injury. Chronic kidney disease stage 2. Hypertension.  EXAM: RENAL / URINARY TRACT ULTRASOUND COMPLETE  COMPARISON:  01/26/2008  FINDINGS: Right Kidney:  Length: 10.3 cm. Echogenicity within normal limits. No mass or hydronephrosis visualized. A few tiny less than 1 cm echogenic foci are noted, suspicious for tiny nonobstructive calculi.  Left Kidney:  Length: 9.6 cm. Echogenicity within normal limits. No mass or hydronephrosis visualized. A few tiny less than 1 cm echogenic foci are noted, suspicious for tiny nonobstructive calculi.  Bladder:  Appears normal for degree of bladder distention.  Other:  Bilateral pleural effusions incidentally noted.  IMPRESSION: No evidence of hydronephrosis. Probable tiny less than 1 cm bilateral nephrolithiasis.  Bilateral pleural effusions incidentally noted.   Electronically Signed   By: Earle Gell M.D.   On: 12/01/2014 13:35   Dg Chest Port 1 View  12/01/2014   CLINICAL DATA:  Newly diagnosed with CHF. Shortness of breath. Atrial fibrillation.  EXAM: PORTABLE CHEST - 1 VIEW  COMPARISON:  11/29/2014  FINDINGS: Heart is enlarged. There is mild interstitial pulmonary edema slightly improved compared to prior study. Suspect small bilateral pleural effusions. No consolidations.  IMPRESSION: Slight improvement in interstitial edema.   Electronically Signed   By: Nolon Nations M.D.   On: 12/01/2014 10:45   Dg Chest Portable 1 View  11/29/2014   CLINICAL DATA:  Shortness of breath.  Weakness.  EXAM: PORTABLE CHEST - 1 VIEW  COMPARISON:  12/28/2013  FINDINGS: Diffuse abnormal increased interstitial accentuation superimposed on emphysema. The interstitial accentuation is coarse with patchy ground-glass opacities throughout both lungs.  Heart size within normal limits. Atherosclerotic calcification in the wall of the aortic arch. Faint airspace opacities at the lung bases.  IMPRESSION: 1. Bilateral abnormal interstitial accentuation, airway thickening, and indistinct  airspace opacities at the lung bases. The appearance  favors acute pulmonary edema. Atypical pneumonia is a differential diagnostic consideration. 2. Emphysema.   Electronically Signed   By: Van Clines M.D.   On: 11/29/2014 10:46    Microbiology: No results found for this or any previous visit (from the past 240 hour(s)).   Labs: Basic Metabolic Panel:  Recent Labs Lab 11/29/14 1053 11/30/14 0422 12/01/14 0530 12/02/14 0400 12/03/14 0555  NA 138 134* 136 135 138  K 3.8 4.2 3.3* 4.3 4.1  CL 104 101 99* 100* 101  CO2 24 24 30 24 28   GLUCOSE 121* 142* 90 84 89  BUN 18 28* 36* 38* 36*  CREATININE 1.57* 1.71* 1.92* 1.73* 1.31*  CALCIUM 8.8 8.5 8.4* 8.3* 8.7*  MG  --   --  1.9 2.0  --    Liver Function Tests:  Recent Labs Lab 12/01/14 0530  AST 28  ALT 21  ALKPHOS 70  BILITOT 0.3  PROT 6.7  ALBUMIN 2.3*   No results for input(s): LIPASE, AMYLASE in the last 168 hours. No results for input(s): AMMONIA in the last 168 hours. CBC:  Recent Labs Lab 11/29/14 1053 12/01/14 0530 12/02/14 0400 12/03/14 0555 12/04/14 0326  WBC 10.4 12.2* 9.8 9.2 9.8  HGB 12.7* 11.8* 12.4* 11.7* 12.5*  HCT 38.7* 36.4* 37.9* 36.3* 38.9*  MCV 88.6 88.6 88.6 89.2 89.6  PLT 377 348 321 338 402*   Cardiac Enzymes: No results for input(s): CKTOTAL, CKMB, CKMBINDEX, TROPONINI in the last 168 hours. BNP: BNP (last 3 results)  Recent Labs  11/29/14 1053 12/01/14 0530  BNP 1513.0* 424.9*    ProBNP (last 3 results) No results for input(s): PROBNP in the last 8760 hours.  CBG: No results for input(s): GLUCAP in the last 168 hours.   Additional labs: BNP: 1513 > 425 Pro calcitonin: <0.10-0.13-<0.10 INR 5/4:2.26 Hemoglobin A1c: 6 TSH: 2.151  Signed:  Lashann Hagg, MD, FACP, FHM. [facility] Pager [phone]  If 7PM-7AM, please contact night-coverage [email or web address] Password [email or web address] 12/04/2014, 12:21 PM

## 2014-12-04 NOTE — Clinical Social Work Placement (Signed)
   CLINICAL SOCIAL WORK PLACEMENT  NOTE  Date:  12/04/2014  Patient Details  Name: Logan Day MRN: [id number] Date of Birth: Nov 23, 1932  Clinical Social Work is seeking post-discharge placement for this patient at the [place] (SNF level of care ) level of care (*CSW will initial, date and re-position this form in  chart as items are completed):  Yes   Patient/family provided with [place] Work Department's list of facilities offering this level of care within the geographic area requested by the patient (or if unable, by the patient's family).  Yes   Patient/family informed of their freedom to choose among providers that offer the needed level of care, that participate in Medicare, Medicaid or managed care program needed by the patient, have an available bed and are willing to accept the patient.  Yes   Patient/family informed of 's ownership interest in [facility] and [facility], as well as of the fact that they are under no obligation to receive care at these facilities.  PASRR submitted to EDS on       PASRR number received on       Existing PASRR number confirmed on 12/03/14     FL2 transmitted to all facilities in geographic area requested by pt/family on 12/03/14     FL2 transmitted to all facilities within larger geographic area on       Patient informed that his/her managed care company has contracts with or will negotiate with certain facilities, including the following:   (NA- Has UHC- Medicare Complete)     Yes   Patient/family informed of bed offers received.  Patient chooses bed at  ([facility])     Physician recommends and patient chooses bed at      Patient to be transferred to [place], [place] on 12/04/14.  Patient to be transferred to facility by [facility] with [facility]  Patient family notified on 12/04/14 of transfer.  Name of family member notified:  Juluis Fitzsimmons- Son      PHYSICIAN Please prepare priority discharge summary, including medications, Please sign FL2, Please sign DNR     Additional Comment: 12/04/14  OK per MD for d/c today to SNF.  Son was pleased with d/c to SNF. Patient remains alert and oriented to person only but was agreeable to d/c.  He stated "I'm ready to leave."  Patient was noted to be calm and cooperative.  Nursing notified to call report to facility.  CSW spoke to [place] at [facility] and she states bed is ready for patient.  CSW will sign off.  Lorie Phenix. Murrell Redden [phone]      _______________________________________________ Kendell Bane T, LCSW 12/04/2014, 5:30 PM [phone]

## 2014-12-04 NOTE — Progress Notes (Signed)
The patient converted back to A. Fib.  HR controlled at this time.  Raliegh Ip Schorr notified.

## 2014-12-04 NOTE — Progress Notes (Signed)
Cardiology was paged according to K. Schorr's orders concerning the patient's heart rhythm change.  The night shift RN reported off to the day shift RN that the patient had converted back into A. Fib and had not received a return call.

## 2014-12-11 ENCOUNTER — Encounter: Payer: Self-pay | Admitting: Interventional Cardiology

## 2014-12-11 ENCOUNTER — Ambulatory Visit (INDEPENDENT_AMBULATORY_CARE_PROVIDER_SITE_OTHER): Payer: Medicare Other | Admitting: Interventional Cardiology

## 2014-12-11 VITALS — BP 128/72 | HR 55 | Ht 71.0 in | Wt 147.8 lb

## 2014-12-11 DIAGNOSIS — Z7901 Long term (current) use of anticoagulants: Secondary | ICD-10-CM

## 2014-12-11 DIAGNOSIS — I5042 Chronic combined systolic (congestive) and diastolic (congestive) heart failure: Secondary | ICD-10-CM

## 2014-12-11 DIAGNOSIS — I25119 Atherosclerotic heart disease of native coronary artery with unspecified angina pectoris: Secondary | ICD-10-CM

## 2014-12-11 DIAGNOSIS — Z79899 Other long term (current) drug therapy: Secondary | ICD-10-CM | POA: Diagnosis not present

## 2014-12-11 DIAGNOSIS — I480 Paroxysmal atrial fibrillation: Secondary | ICD-10-CM

## 2014-12-11 DIAGNOSIS — N182 Chronic kidney disease, stage 2 (mild): Secondary | ICD-10-CM | POA: Diagnosis not present

## 2014-12-11 MED ORDER — AMIODARONE HCL 200 MG PO TABS: 200.0000 mg | ORAL_TABLET | Freq: Every day | ORAL | Status: DC

## 2014-12-11 NOTE — Progress Notes (Signed)
Cardiology Office Note   Date:  12/11/2014   ID:  Logan Day, DOB [birth year], MRN [id number]  PCP:  Dorian Heckle, MD  Cardiologist:  Sinclair Grooms, MD   Chief Complaint  Patient presents with  . ATHEROSCLEROTIC HEART DISEASE OF NATIVE CORONARY ARTERY    WITH ANGINA PECTORIS      History of Present Illness: Logan Day is a 81 y.o. male who presents for  Coronary artery disease with prior stenting, hypertension, chronic combined systolic and diastolic heart failure, chronic anticoagulation therapy, paroxysmal atrial fibrillation ( sotalol switched amiodarone because of chronic kidney disease), an chronic kidney disease stage III.   He was recently hospitalized with pneumonia. During hospital stay he was noted to have paroxysmal atrial fibrillation. Because of abnormal kidney function, sotalol was discontinued and amiodarone was started. Patient states that he feels stronger now than when he was admitted to the hospital. He has not had syncope or palpitations. He denies angina. No bleeding on Coumadin. He is currently on amiodarone 200 mg twice a day.    Past Medical History  Diagnosis Date  . Hypertension   . Atrial fibrillation   . Hypercholesteremia   . MI (myocardial infarction)     x 2  . Angina   . Cancer     prostate cancer  . Coronary artery disease     Circ stent (2009 cath patent)  . Prostate cancer   . Hyperlipidemia   . MI (myocardial infarction)   . TIA (transient ischemic attack)   . GERD (gastroesophageal reflux disease)   . Shortness of breath dyspnea     Past Surgical History  Procedure Laterality Date  . Prostayectomy    . Fracture surgery    . Coronary angioplasty with stent placement  10/19/2012    DES  to circumflex  . Percutaneous coronary stent intervention (pci-s) N/A 10/19/2012    Procedure: PERCUTANEOUS CORONARY STENT INTERVENTION (PCI-S);  Surgeon: Sinclair Grooms, MD;  Location: [facility] CATH LAB;  Service: Cardiovascular;   Laterality: N/A;     Current Outpatient Prescriptions  Medication Sig Dispense Refill  . ALPRAZolam (XANAX) 0.5 MG tablet Take 1 tablet (0.5 mg total) by mouth at bedtime. 5 tablet 0  . amiodarone (PACERONE) 200 MG tablet Take 1 tablet (200 mg total) by mouth daily.    [name] aspirin EC 81 MG tablet Take 1 tablet (81 mg total) by mouth daily.    [name] buPROPion (WELLBUTRIN SR) 150 MG 12 hr tablet Take 150 mg by mouth 2 (two) times daily.     . feeding supplement, ENSURE ENLIVE, (ENSURE ENLIVE) LIQD Take 237 mLs by mouth 2 (two) times daily after a meal.    . metoprolol (LOPRESSOR) 50 MG tablet Take 1 tablet (50 mg total) by mouth 2 (two) times daily.    . Multiple Vitamin (MULTIVITAMIN WITH MINERALS) TABS tablet Take 1 tablet by mouth every evening.    . nitroGLYCERIN (NITROSTAT) 0.4 MG SL tablet Place 0.4 mg under the tongue every 5 (five) minutes as needed for chest pain.    [name] omeprazole (PRILOSEC) 20 MG capsule Take 20 mg by mouth every morning.    [name] oxymetazoline (AFRIN) 0.05 % nasal spray Place 1 spray into both nostrils 2 (two) times daily as needed for congestion.    . pravastatin (PRAVACHOL) 40 MG tablet Take 40 mg by mouth every evening.    . sertraline (ZOLOFT) 100 MG tablet Take 100 mg by mouth every evening.    [name]  traMADol (ULTRAM) 50 MG tablet Take 50 mg by mouth 2 (two) times daily as needed for pain.    [name] warfarin (COUMADIN) 4 MG tablet Take 2-4 mg by mouth every evening. Take half of a tablet (2mg ) on Tuesdays & Fridays. On all other days take a whole tablet (4mg ).     No current facility-administered medications for this visit.    Allergies:   Review of patient's allergies indicates no known allergies.    Social History:  The patient  reports that he has quit smoking. He has quit using smokeless tobacco. His smokeless tobacco use included Chew. He reports that he does not drink alcohol or use illicit drugs.   Family History:  The patient's family history includes Coronary artery  disease in his son; Heart disease in his father and mother.    ROS:  Please see the history of present illness.   Otherwise, review of systems are positive for  Decreased hearing , fatigue, cough, dyspnea on exertion, and decreased hearing..   All other systems are reviewed and negative.    PHYSICAL EXAM: VS:  BP 128/72 mmHg  Pulse 55  Ht 5\' 11"  (1.803 m)  Wt 147 lb 12.8 oz (67.042 kg)  BMI 20.62 kg/m2  SpO2 97% , BMI Body mass index is 20.62 kg/(m^2). GEN: Well nourished, well developed, in no acute distress HEENT: normal Neck: no JVD, carotid bruits, or masses Cardiac: IRR; no murmurs, rubs, or gallops,no edema  Respiratory:  clear to auscultation bilaterally, normal work of breathing GI: soft, nontender, nondistended, + BS MS: no deformity or atrophy Skin: warm and dry, no rash Neuro:  Strength and sensation are intact Psych: euthymic mood, full affect   EKG:  EKG is ordered today. The ekg ordered today demonstrates  Sinus bradycardia , atrial abnormality, first-degree AV block , nonspecific ST abnormality.   Recent Labs: 11/29/2014: TSH 2.151 12/01/2014: ALT 21; B Natriuretic Peptide 424.9* 12/02/2014: Magnesium 2.0 12/03/2014: BUN 36*; Creatinine 1.31*; Potassium 4.1; Sodium 138 12/04/2014: Hemoglobin 12.5*; Platelets 402*    Lipid Panel    Component Value Date/Time   CHOL 153 07/16/2011 0335   TRIG 137 07/16/2011 0335   HDL 37* 07/16/2011 0335   CHOLHDL 4.1 07/16/2011 0335   VLDL 27 07/16/2011 0335   LDLCALC 89 07/16/2011 0335      Wt Readings from Last 3 Encounters:  12/11/14 147 lb 12.8 oz (67.042 kg)  12/04/14 141 lb 0.1 oz (63.961 kg)  09/02/14 147 lb (66.679 kg)      Other studies Reviewed: Additional studies/ records that were reviewed today include:  I reviewed the recent hospital stay. Review of the above records demonstrates:  I identified that the patient's kidney function has deteriorated and the sotalol was discontinued. Amiodarone is been used to  maintain sinus rhythm. He is in a slow outpatient loading phase.   ASSESSMENT AND PLAN:  On amiodarone therapy:  No side effects  PAF (paroxysmal atrial fibrillation) :  Maintaining normal sinus rhythm by EKG today  CKD (chronic kidney disease) stage 2, GFR 60-89 ml/min  Chronic combined systolic and diastolic congestive heart failure : No evidence of volume overload  Long term current use of anticoagulant therapy : No bleeding on anticoagulation  Atherosclerosis of native coronary artery of native heart with angina pectoris : Denies angina     Current medicines are reviewed at length with the patient today.  The patient does not have concerns regarding medicines.  The following changes have been made:  Decrease amiodarone to 200 mg daily. Follow-up in 6 months for office visit, EKG, TSH, and hepatic panel.  Labs/ tests ordered today include:  No orders of the defined types were placed in this encounter.     Disposition:   FU with HS in 6 months  Signed, Sinclair Grooms, MD  12/11/2014 2:29 PM    [address], [place], [place]  [address] Phone: [phone]; Fax: [phone]

## 2014-12-11 NOTE — Addendum Note (Signed)
Addended by: Velna Ochs on: 12/11/2014 02:39 PM   Modules accepted: Orders

## 2014-12-11 NOTE — Patient Instructions (Signed)
Medication Instructions:  Your physician has recommended you make the following change in your medication:  1) DECREASE Amiodarone to 200mg  daily  Labwork: Your physician recommends that you return for lab work in: 6 months Tsh, hepatic  Testing/Procedures: None   Follow-Up: Your physician wants you to follow-up in: 6 months with an EKG You will receive a reminder letter in the mail two months in advance. If you don't receive a letter, please call our office to schedule the follow-up appointment.   Any Other Special Instructions Will Be Listed Below (If Applicable).

## 2015-01-22 ENCOUNTER — Ambulatory Visit (INDEPENDENT_AMBULATORY_CARE_PROVIDER_SITE_OTHER): Payer: Medicare Other | Admitting: Primary Care

## 2015-01-22 ENCOUNTER — Other Ambulatory Visit: Payer: Self-pay | Admitting: *Deleted

## 2015-01-22 ENCOUNTER — Encounter: Payer: Self-pay | Admitting: Primary Care

## 2015-01-22 ENCOUNTER — Ambulatory Visit (INDEPENDENT_AMBULATORY_CARE_PROVIDER_SITE_OTHER): Payer: Medicare Other | Admitting: *Deleted

## 2015-01-22 VITALS — BP 126/70 | HR 70 | Temp 97.3°F | Ht 71.0 in | Wt 154.4 lb

## 2015-01-22 DIAGNOSIS — Z7901 Long term (current) use of anticoagulants: Secondary | ICD-10-CM | POA: Diagnosis not present

## 2015-01-22 DIAGNOSIS — I639 Cerebral infarction, unspecified: Secondary | ICD-10-CM

## 2015-01-22 DIAGNOSIS — F418 Other specified anxiety disorders: Secondary | ICD-10-CM

## 2015-01-22 DIAGNOSIS — I480 Paroxysmal atrial fibrillation: Secondary | ICD-10-CM | POA: Diagnosis not present

## 2015-01-22 DIAGNOSIS — F329 Major depressive disorder, single episode, unspecified: Secondary | ICD-10-CM | POA: Insufficient documentation

## 2015-01-22 DIAGNOSIS — F419 Anxiety disorder, unspecified: Secondary | ICD-10-CM

## 2015-01-22 DIAGNOSIS — I10 Essential (primary) hypertension: Secondary | ICD-10-CM

## 2015-01-22 LAB — POCT INR: INR: 3.4

## 2015-01-22 MED ORDER — AMIODARONE HCL 200 MG PO TABS: 200.0000 mg | ORAL_TABLET | Freq: Every day | ORAL | Status: DC

## 2015-01-22 NOTE — Patient Instructions (Signed)
Hold Coumadin today (6/22) and eat a good serving of leafy green vegetable today and tomorrow (6/22 & 6/23).  Then resume taking 4 mg daily except 2 mg on Monday/Wednesday/Friday.  Recheck in 1 week.

## 2015-01-22 NOTE — Assessment & Plan Note (Signed)
Managed on Zoloft and Wellbutrin daily, Xanax PRN for sleep. Feels well managed, denies sadness, SI/HI. Continue current therapy

## 2015-01-22 NOTE — Progress Notes (Signed)
Subjective:    Patient ID: Logan Day, male    DOB: 1933/06/21, 81 y.o.   MRN: [id number]  HPI  Mr. Logan Day is a 81 year old male who presents today to establish care and discuss the problems mentioned below. Will review old records.  1) Anxiety and Depression: Diagnosed several years and managed on Zoloft and Wellbutrin. He takes Alprazolam at bedtime as needed for sleep. Overall he feels well managed and denies depression and sadness.   2) Atrial Fibrillation: Present for years. Stroke occurred after diagnosis of AFib about 4 years ago. He has expressive aphagia as residual from his stroke. He is managed on Coumadin for atrial fibrillation. Follows with Dr. Leta Baptist with neurology and next appointment is in August 2016.  3) CHF: Diagnosed in April 2016 during admission for shortness of breath. He was placed on amiodarone after his hospitalization in April 2016. 2D echocardiogram shows 40-45% EF. Follows with Dr. Tamala Julian at [facility], next appointment is in November 2016.  Review of Systems  Constitutional:       Gradual weight loss since SNF, has slowly been gaining.  HENT: Negative for rhinorrhea.   Respiratory: Negative for cough and shortness of breath.   Cardiovascular: Negative for chest pain.  Gastrointestinal: Negative for diarrhea and constipation.  Genitourinary: Negative for difficulty urinating.  Musculoskeletal: Negative for myalgias and arthralgias.  Skin: Negative for rash.  Neurological: Negative for dizziness and headaches.  Psychiatric/Behavioral:       Well managed on medications.        Past Medical History  Diagnosis Date  . Hypertension   . Atrial fibrillation   . Hypercholesteremia   . MI (myocardial infarction)     x 2  . Angina   . Cancer     prostate cancer  . Coronary artery disease     Circ stent (2009 cath patent)  . Prostate cancer   . Hyperlipidemia   . TIA (transient ischemic attack)   . GERD (gastroesophageal reflux disease)    . Shortness of breath dyspnea     History   Social History  . Marital Status: Widowed    Spouse Name: N/A  . Number of Children: N/A  . Years of Education: N/A   Occupational History  . Not on file.   Social History Main Topics  . Smoking status: Former Smoker -- 1.00 packs/day for 40 years  . Smokeless tobacco: Former Systems developer    Types: Chew     Comment: quit smoking 2002  . Alcohol Use: No  . Drug Use: No  . Sexual Activity: No   Other Topics Concern  . Not on file   Social History Narrative   Widower.   Retired. Worked for [employer].   Working outdoors.       Past Surgical History  Procedure Laterality Date  . Prostayectomy    . Fracture surgery    . Coronary angioplasty with stent placement  10/19/2012    DES  to circumflex  . Percutaneous coronary stent intervention (pci-s) N/A 10/19/2012    Procedure: PERCUTANEOUS CORONARY STENT INTERVENTION (PCI-S);  Surgeon: Sinclair Grooms, MD;  Location: [facility] CATH LAB;  Service: Cardiovascular;  Laterality: N/A;    Family History  Problem Relation Age of Onset  . Coronary artery disease Son   . Heart disease Mother   . Heart disease Father     No Known Allergies  Current Outpatient Prescriptions on File Prior to Visit  Medication Sig  Dispense Refill  . ALPRAZolam (XANAX) 0.5 MG tablet Take 1 tablet (0.5 mg total) by mouth at bedtime. 5 tablet 0  . amiodarone (PACERONE) 200 MG tablet Take 1 tablet (200 mg total) by mouth daily.    [name] aspirin EC 81 MG tablet Take 1 tablet (81 mg total) by mouth daily.    [name] buPROPion (WELLBUTRIN SR) 150 MG 12 hr tablet Take 150 mg by mouth 2 (two) times daily.     . feeding supplement, ENSURE ENLIVE, (ENSURE ENLIVE) LIQD Take 237 mLs by mouth 2 (two) times daily after a meal.    . metoprolol (LOPRESSOR) 50 MG tablet Take 1 tablet (50 mg total) by mouth 2 (two) times daily.    . Multiple Vitamin (MULTIVITAMIN WITH MINERALS) TABS tablet Take 1 tablet by mouth every evening.    .  nitroGLYCERIN (NITROSTAT) 0.4 MG SL tablet Place 0.4 mg under the tongue every 5 (five) minutes as needed for chest pain.    [name] omeprazole (PRILOSEC) 20 MG capsule Take 20 mg by mouth every morning.    . pravastatin (PRAVACHOL) 40 MG tablet Take 40 mg by mouth every evening.    . sertraline (ZOLOFT) 100 MG tablet Take 100 mg by mouth every evening.    . traMADol (ULTRAM) 50 MG tablet Take 50 mg by mouth 2 (two) times daily as needed for pain.    [name] warfarin (COUMADIN) 4 MG tablet Take 2-4 mg by mouth every evening. Take half of a tablet (2mg ) on Tuesdays & Fridays. On all other days take a whole tablet (4mg ).     No current facility-administered medications on file prior to visit.    BP 126/70 mmHg  Pulse 70  Temp(Src) 97.3 F (36.3 C) (Oral)  Ht 5\' 11"  (1.803 m)  Wt 154 lb 6.4 oz (70.035 kg)  BMI 21.54 kg/m2  SpO2 97%    Objective:   Physical Exam  Constitutional: He is oriented to person, place, and time. He appears well-nourished.  Cardiovascular: Normal rate and regular rhythm.   Pulmonary/Chest: Effort normal and breath sounds normal.  Neurological: He is alert and oriented to person, place, and time.  Skin: Skin is warm and dry.  Psychiatric: He has a normal mood and affect.          Assessment & Plan:

## 2015-01-22 NOTE — Progress Notes (Signed)
Pre visit review using our clinic review tool, if applicable. No additional management support is needed unless otherwise documented below in the visit note. 

## 2015-01-22 NOTE — Assessment & Plan Note (Signed)
Stable in office today.

## 2015-01-22 NOTE — Assessment & Plan Note (Signed)
Regular rhythm today, regular rate. Denies chest pain and SOB. Managed on coumadin. Visited today with our coumadin clinic.

## 2015-01-22 NOTE — Patient Instructions (Signed)
Please schedule a physical with me in the next 3 months. You will also schedule a lab only appointment one week prior. We will discuss your lab results during your physical.  It was a pleasure to meet you today! Please don't hesitate to call me with any questions. Welcome to [facility]!

## 2015-01-30 ENCOUNTER — Ambulatory Visit (INDEPENDENT_AMBULATORY_CARE_PROVIDER_SITE_OTHER): Payer: Medicare Other | Admitting: *Deleted

## 2015-01-30 DIAGNOSIS — Z7901 Long term (current) use of anticoagulants: Secondary | ICD-10-CM | POA: Diagnosis not present

## 2015-01-30 LAB — POCT INR: INR: 1.2

## 2015-01-30 NOTE — Progress Notes (Signed)
Pre visit review using our clinic review tool, if applicable. No additional management support is needed unless otherwise documented below in the visit note. 

## 2015-02-10 ENCOUNTER — Ambulatory Visit (INDEPENDENT_AMBULATORY_CARE_PROVIDER_SITE_OTHER): Payer: Medicare Other | Admitting: *Deleted

## 2015-02-10 DIAGNOSIS — Z7901 Long term (current) use of anticoagulants: Secondary | ICD-10-CM

## 2015-02-10 DIAGNOSIS — I639 Cerebral infarction, unspecified: Secondary | ICD-10-CM | POA: Diagnosis not present

## 2015-02-10 LAB — POCT INR: INR: 3.6

## 2015-02-10 NOTE — Progress Notes (Signed)
Pre visit review using our clinic review tool, if applicable. No additional management support is needed unless otherwise documented below in the visit note. 

## 2015-02-27 ENCOUNTER — Telehealth: Payer: Self-pay | Admitting: Primary Care

## 2015-02-27 ENCOUNTER — Ambulatory Visit: Payer: Medicare Other

## 2015-02-27 NOTE — Telephone Encounter (Signed)
Patient did not come in for their appointment today for PT INR.  Please let me know if patient needs to be contacted immediately for follow up or no follow up needed. °

## 2015-02-27 NOTE — Telephone Encounter (Signed)
Please reschedule appointment within one week.  Thanks!

## 2015-02-28 NOTE — Telephone Encounter (Signed)
Returned cal, call could not be completed as dialed

## 2015-02-28 NOTE — Telephone Encounter (Signed)
Noted  

## 2015-02-28 NOTE — Telephone Encounter (Signed)
Pt returned call, scheduled coumadin for mon 8/1

## 2015-03-03 ENCOUNTER — Ambulatory Visit: Payer: Medicare Other

## 2015-03-03 ENCOUNTER — Ambulatory Visit (INDEPENDENT_AMBULATORY_CARE_PROVIDER_SITE_OTHER): Payer: Medicare Other | Admitting: Diagnostic Neuroimaging

## 2015-03-03 ENCOUNTER — Encounter: Payer: Self-pay | Admitting: Diagnostic Neuroimaging

## 2015-03-03 ENCOUNTER — Ambulatory Visit (INDEPENDENT_AMBULATORY_CARE_PROVIDER_SITE_OTHER): Payer: Medicare Other | Admitting: *Deleted

## 2015-03-03 ENCOUNTER — Ambulatory Visit: Payer: Medicare Other | Admitting: *Deleted

## 2015-03-03 VITALS — BP 126/74 | HR 78 | Ht 71.0 in | Wt 155.2 lb

## 2015-03-03 DIAGNOSIS — Z7901 Long term (current) use of anticoagulants: Secondary | ICD-10-CM

## 2015-03-03 DIAGNOSIS — Z8673 Personal history of transient ischemic attack (TIA), and cerebral infarction without residual deficits: Secondary | ICD-10-CM | POA: Diagnosis not present

## 2015-03-03 DIAGNOSIS — F03B0 Unspecified dementia, moderate, without behavioral disturbance, psychotic disturbance, mood disturbance, and anxiety: Secondary | ICD-10-CM

## 2015-03-03 DIAGNOSIS — I6930 Unspecified sequelae of cerebral infarction: Secondary | ICD-10-CM

## 2015-03-03 DIAGNOSIS — R413 Other amnesia: Secondary | ICD-10-CM | POA: Diagnosis not present

## 2015-03-03 DIAGNOSIS — F0390 Unspecified dementia without behavioral disturbance: Secondary | ICD-10-CM

## 2015-03-03 LAB — POCT INR: INR: 4.1

## 2015-03-03 NOTE — Progress Notes (Signed)
Pre visit review using our clinic review tool, if applicable. No additional management support is needed unless otherwise documented below in the visit note. 
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
## 2015-03-17 ENCOUNTER — Ambulatory Visit (INDEPENDENT_AMBULATORY_CARE_PROVIDER_SITE_OTHER): Payer: Medicare Other | Admitting: *Deleted

## 2015-03-17 DIAGNOSIS — I639 Cerebral infarction, unspecified: Secondary | ICD-10-CM

## 2015-03-17 DIAGNOSIS — Z7901 Long term (current) use of anticoagulants: Secondary | ICD-10-CM

## 2015-03-17 LAB — POCT INR: INR: 5.5

## 2015-03-17 NOTE — Progress Notes (Signed)
Pre visit review using our clinic review tool, if applicable. No additional management support is needed unless otherwise documented below in the visit note. 

## 2015-03-24 ENCOUNTER — Ambulatory Visit (INDEPENDENT_AMBULATORY_CARE_PROVIDER_SITE_OTHER): Payer: Medicare Other | Admitting: *Deleted

## 2015-03-24 DIAGNOSIS — Z7901 Long term (current) use of anticoagulants: Secondary | ICD-10-CM | POA: Diagnosis not present

## 2015-03-24 DIAGNOSIS — I639 Cerebral infarction, unspecified: Secondary | ICD-10-CM

## 2015-03-24 LAB — POCT INR: INR: 3.2

## 2015-03-24 NOTE — Progress Notes (Signed)
Pre visit review using our clinic review tool, if applicable. No additional management support is needed unless otherwise documented below in the visit note. 

## 2015-04-10 ENCOUNTER — Other Ambulatory Visit: Payer: Self-pay | Admitting: Internal Medicine

## 2015-04-10 ENCOUNTER — Ambulatory Visit (INDEPENDENT_AMBULATORY_CARE_PROVIDER_SITE_OTHER): Payer: Medicare Other | Admitting: *Deleted

## 2015-04-10 DIAGNOSIS — I10 Essential (primary) hypertension: Secondary | ICD-10-CM

## 2015-04-10 DIAGNOSIS — Z125 Encounter for screening for malignant neoplasm of prostate: Secondary | ICD-10-CM

## 2015-04-10 DIAGNOSIS — Z7901 Long term (current) use of anticoagulants: Secondary | ICD-10-CM

## 2015-04-10 DIAGNOSIS — E785 Hyperlipidemia, unspecified: Secondary | ICD-10-CM

## 2015-04-10 DIAGNOSIS — I639 Cerebral infarction, unspecified: Secondary | ICD-10-CM | POA: Diagnosis not present

## 2015-04-10 LAB — POCT INR
INR: 6.3
INR: 7

## 2015-04-10 LAB — PROTIME-INR
INR: 6.3 ratio (ref 0.8–1.0)
Prothrombin Time: 66.7 s (ref 9.6–13.1)

## 2015-04-10 NOTE — Progress Notes (Signed)
Pre visit review using our clinic review tool, if applicable. No additional management support is needed unless otherwise documented below in the visit note. 

## 2015-04-14 ENCOUNTER — Ambulatory Visit: Payer: Medicare Other

## 2015-04-16 ENCOUNTER — Other Ambulatory Visit (INDEPENDENT_AMBULATORY_CARE_PROVIDER_SITE_OTHER): Payer: Medicare Other

## 2015-04-16 ENCOUNTER — Telehealth: Payer: Self-pay | Admitting: Primary Care

## 2015-04-16 DIAGNOSIS — E785 Hyperlipidemia, unspecified: Secondary | ICD-10-CM

## 2015-04-16 DIAGNOSIS — I10 Essential (primary) hypertension: Secondary | ICD-10-CM

## 2015-04-16 DIAGNOSIS — Z125 Encounter for screening for malignant neoplasm of prostate: Secondary | ICD-10-CM | POA: Diagnosis not present

## 2015-04-16 LAB — COMPREHENSIVE METABOLIC PANEL
ALBUMIN: 3.4 g/dL — AB (ref 3.5–5.2)
ALK PHOS: 87 U/L (ref 39–117)
ALT: 27 U/L (ref 0–53)
AST: 29 U/L (ref 0–37)
BUN: 26 mg/dL — AB (ref 6–23)
CHLORIDE: 105 meq/L (ref 96–112)
CO2: 28 mEq/L (ref 19–32)
Calcium: 8.9 mg/dL (ref 8.4–10.5)
Creatinine, Ser: 1.52 mg/dL — ABNORMAL HIGH (ref 0.40–1.50)
GFR: 47.16 mL/min — AB (ref >60.00)
GLUCOSE: 85 mg/dL (ref 70–99)
POTASSIUM: 4 meq/L (ref 3.5–5.1)
SODIUM: 141 meq/L (ref 135–145)
Total Bilirubin: 0.7 mg/dL (ref 0.2–1.2)
Total Protein: 7.2 g/dL (ref 6.0–8.3)

## 2015-04-16 LAB — PSA, MEDICARE

## 2015-04-16 LAB — CBC
HEMATOCRIT: 37.1 % — AB (ref 39.0–52.0)
HEMOGLOBIN: 12.2 g/dL — AB (ref 13.0–17.0)
MCHC: 33 g/dL (ref 30.0–36.0)
MCV: 90.7 fl (ref 78.0–100.0)
Platelets: 289 10*3/uL (ref 150.0–400.0)
RBC: 4.09 Mil/uL — ABNORMAL LOW (ref 4.22–5.81)
RDW: 16.1 % — AB (ref 11.5–15.5)
WBC: 8.7 10*3/uL (ref 4.0–10.5)

## 2015-04-16 LAB — LIPID PANEL
CHOLESTEROL: 131 mg/dL (ref 0–200)
HDL: 50.3 mg/dL (ref >39.00)
LDL CALC: 65 mg/dL (ref 0–99)
NonHDL: 80.79
Total CHOL/HDL Ratio: 3
Triglycerides: 81 mg/dL (ref 0.0–149.0)
VLDL: 16.2 mg/dL (ref 0.0–40.0)

## 2015-04-16 NOTE — Telephone Encounter (Signed)
[name] (son) called wanting to get mr Kohlmann pt/inr results

## 2015-04-16 NOTE — Telephone Encounter (Signed)
Noted  

## 2015-04-16 NOTE — Telephone Encounter (Signed)
Spoke to [place] and informed him that Mr. Fermin did not come back in on 9/12 to have INR rechecked.  [name] reports that Mr. Jean has continued to take Coumadin since last check on 9/8 of 6.3.  [name] advised to remove all Coumadin tablets from pill box.  Patient scheduled for recheck tomorrow morning at 815.  [name] will come with Mr. Ernster to go over new instructions.

## 2015-04-17 ENCOUNTER — Ambulatory Visit: Payer: Medicare Other

## 2015-04-17 ENCOUNTER — Ambulatory Visit (INDEPENDENT_AMBULATORY_CARE_PROVIDER_SITE_OTHER): Payer: Medicare Other | Admitting: *Deleted

## 2015-04-17 DIAGNOSIS — I639 Cerebral infarction, unspecified: Secondary | ICD-10-CM | POA: Diagnosis not present

## 2015-04-17 DIAGNOSIS — Z7901 Long term (current) use of anticoagulants: Secondary | ICD-10-CM | POA: Diagnosis not present

## 2015-04-17 LAB — POCT INR: INR: 1.6

## 2015-04-17 NOTE — Progress Notes (Signed)
Pre visit review using our clinic review tool, if applicable. No additional management support is needed unless otherwise documented below in the visit note. 

## 2015-04-22 ENCOUNTER — Encounter: Payer: Self-pay | Admitting: Primary Care

## 2015-04-22 ENCOUNTER — Ambulatory Visit (INDEPENDENT_AMBULATORY_CARE_PROVIDER_SITE_OTHER): Payer: Medicare Other | Admitting: Primary Care

## 2015-04-22 ENCOUNTER — Ambulatory Visit: Payer: Medicare Other

## 2015-04-22 ENCOUNTER — Other Ambulatory Visit (INDEPENDENT_AMBULATORY_CARE_PROVIDER_SITE_OTHER): Payer: Medicare Other

## 2015-04-22 VITALS — BP 138/72 | HR 73 | Temp 97.0°F | Ht 71.0 in | Wt 152.4 lb

## 2015-04-22 DIAGNOSIS — I639 Cerebral infarction, unspecified: Secondary | ICD-10-CM | POA: Diagnosis not present

## 2015-04-22 DIAGNOSIS — Z7901 Long term (current) use of anticoagulants: Secondary | ICD-10-CM | POA: Diagnosis not present

## 2015-04-22 DIAGNOSIS — E785 Hyperlipidemia, unspecified: Secondary | ICD-10-CM | POA: Diagnosis not present

## 2015-04-22 DIAGNOSIS — Z23 Encounter for immunization: Secondary | ICD-10-CM

## 2015-04-22 DIAGNOSIS — F419 Anxiety disorder, unspecified: Secondary | ICD-10-CM

## 2015-04-22 DIAGNOSIS — Z0000 Encounter for general adult medical examination without abnormal findings: Secondary | ICD-10-CM

## 2015-04-22 DIAGNOSIS — F32A Depression, unspecified: Secondary | ICD-10-CM

## 2015-04-22 DIAGNOSIS — N182 Chronic kidney disease, stage 2 (mild): Secondary | ICD-10-CM | POA: Diagnosis not present

## 2015-04-22 DIAGNOSIS — F418 Other specified anxiety disorders: Secondary | ICD-10-CM

## 2015-04-22 DIAGNOSIS — I10 Essential (primary) hypertension: Secondary | ICD-10-CM | POA: Diagnosis not present

## 2015-04-22 DIAGNOSIS — R32 Unspecified urinary incontinence: Secondary | ICD-10-CM

## 2015-04-22 DIAGNOSIS — F0280 Dementia in other diseases classified elsewhere without behavioral disturbance: Secondary | ICD-10-CM

## 2015-04-22 DIAGNOSIS — I480 Paroxysmal atrial fibrillation: Secondary | ICD-10-CM | POA: Diagnosis not present

## 2015-04-22 DIAGNOSIS — F329 Major depressive disorder, single episode, unspecified: Secondary | ICD-10-CM

## 2015-04-22 LAB — POCT INR: INR: 1.7

## 2015-04-22 NOTE — Assessment & Plan Note (Signed)
Stable in office today. 

## 2015-04-22 NOTE — Assessment & Plan Note (Signed)
Managed on warfarin at [facility] coumadin clinic. Goal is INR of 2-3. INR 1.7 today. Will discuss with son and have him take 1 full tablet daily, and 1/2 tablet on Wednesday. Recheck in 2 weeks.

## 2015-04-22 NOTE — Assessment & Plan Note (Signed)
Creatinine 1.5 with GFR in 40's 6 days ago. Will repeat during next visit and closely monitor.

## 2015-04-22 NOTE — Assessment & Plan Note (Signed)
PHQ 2 score of 0 today. Continue current regimen as he feels well managed. Will continue to monitor.

## 2015-04-22 NOTE — Assessment & Plan Note (Signed)
Stable. Continue current pravastatin.

## 2015-04-22 NOTE — Progress Notes (Signed)
Patient ID: Logan Day, male   DOB: 09/11/1932, 81 y.o.   MRN: [id number]  HPI: Logan Day is a 81 year old male who presents today for AWV, subsequent.  Past Medical History  Diagnosis Date  . Hypertension   . Atrial fibrillation   . Hypercholesteremia   . MI (myocardial infarction)     x 2  . Angina   . Cancer     prostate cancer  . Coronary artery disease     Circ stent (2009 cath patent)  . Prostate cancer   . Hyperlipidemia   . TIA (transient ischemic attack)   . GERD (gastroesophageal reflux disease)   . Shortness of breath dyspnea     Current Outpatient Prescriptions  Medication Sig Dispense Refill  . ALPRAZolam (XANAX) 0.5 MG tablet Take 1 tablet (0.5 mg total) by mouth at bedtime. 5 tablet 0  . amiodarone (PACERONE) 200 MG tablet Take 1 tablet (200 mg total) by mouth daily. 30 tablet 5  . aspirin EC 81 MG tablet Take 1 tablet (81 mg total) by mouth daily.    [name] buPROPion (WELLBUTRIN SR) 150 MG 12 hr tablet Take 150 mg by mouth 2 (two) times daily.     . feeding supplement, ENSURE ENLIVE, (ENSURE ENLIVE) LIQD Take 237 mLs by mouth 2 (two) times daily after a meal.    . metoprolol succinate (TOPROL-XL) 25 MG 24 hr tablet     . Multiple Vitamin (MULTIVITAMIN WITH MINERALS) TABS tablet Take 1 tablet by mouth every evening.    . nitroGLYCERIN (NITROSTAT) 0.4 MG SL tablet Place 0.4 mg under the tongue every 5 (five) minutes as needed for chest pain.    [name] omeprazole (PRILOSEC) 20 MG capsule Take 20 mg by mouth every morning.    . pravastatin (PRAVACHOL) 40 MG tablet Take 40 mg by mouth every evening.    . sertraline (ZOLOFT) 100 MG tablet Take 100 mg by mouth every evening.    . traMADol (ULTRAM) 50 MG tablet Take 50 mg by mouth 2 (two) times daily as needed for pain.    [name] warfarin (COUMADIN) 4 MG tablet Take 2-4 mg by mouth every evening. Take half of a tablet (2mg ) on Tuesdays & Fridays. On all other days take a whole tablet (4mg ).     No current facility-administered  medications for this visit.    No Known Allergies  Family History  Problem Relation Age of Onset  . Coronary artery disease Son   . Heart disease Mother   . Heart disease Father     Social History   Social History  . Marital Status: Widowed    Spouse Name: N/A  . Number of Children: N/A  . Years of Education: N/A   Occupational History  . Not on file.   Social History Main Topics  . Smoking status: Former Smoker -- 1.00 packs/day for 40 years  . Smokeless tobacco: Former Systems developer    Types: Chew     Comment: quit smoking 2002  . Alcohol Use: No  . Drug Use: No  . Sexual Activity: No   Other Topics Concern  . Not on file   Social History Narrative   Widower.   Retired. Worked for [employer].   Working outdoors.       Hospitiliaztions: April 29th May 4th 2016.  Health Maintenance:    Flu: Completed last season, will provide today.  Tetanus: Believes it was 2-3 years ago.  Pneumovax: Completed in 2012  Prevnar: Completed in 2015  Zostavax: Unsure if he's completed in the past.  Colonoscopy: Completed 10 years ago.  Eye Doctor: Completed 2 weeks ago.  Dental Exam: Completed in 2016  PSA: Normal. Completed in 2016.    Providers: Alma Friendly, AGNP, PCP.   I have personally reviewed and have noted: 1. The patient's medical and social history 2. Their use of alcohol, tobacco or illicit drugs 3. Their current medications and supplements 4. The patient's functional ability including ADL's, fall risks, home safety risks and hearing or visual  impairment. 5. Diet and physical activities 6. Evidence for depression or mood disorder  Subjective:   Review of Systems:   Constitutional: Denies fever, malaise, fatigue, headache or abrupt weight changes.  HEENT: Denies eye pain, eye redness, ear pain, ringing in the ears, wax buildup, runny nose, nasal congestion, bloody nose, or sore throat. Respiratory: Denies difficulty breathing, shortness of breath, cough  or sputum production.   Cardiovascular: Denies chest pain, chest tightness, palpitations or swelling in the hands or feet.  Gastrointestinal: Denies abdominal pain, bloating, constipation, diarrhea or blood in the stool.  GU: Denies pain with urination, burning sensation, blood in urine, odor or discharge. See noted below. Musculoskeletal: Denies decrease in range of motion, difficulty with gait, muscle pain or joint pain and swelling.  Skin: Denies redness, rashes, lesions or ulcercations.  Neurological: Denies dizziness,  or problems with balance and coordination. He has some difficulty remembering things and has some expressive aphasia since prior CVA.  1) Incontinence: Urinary. Occurs daily. He will use the bathroom every morning upon waking. Shortly after using the bathroom he'll have to go again. He will use the bathroom numerous times daily. Sometimes he experiences difficulty initiating his stream, other times he will have incontinence. This has been present for the past several months. He's stuffing wash cloths into his underwear due to incontinence. He has a history of prostate cancer with prostatectomy.   No other specific complaints in a complete review of systems (except as listed in HPI above).  Objective:  PE:   BP 138/72 mmHg  Pulse 73  Temp(Src) 97 F (36.1 C) (Oral)  Ht 5\' 11"  (1.803 m)  Wt 152 lb 6.4 oz (69.128 kg)  BMI 21.26 kg/m2  SpO2 95% Wt Readings from Last 3 Encounters:  04/22/15 152 lb 6.4 oz (69.128 kg)  03/03/15 155 lb 3.2 oz (70.398 kg)  01/22/15 154 lb 6.4 oz (70.035 kg)    General: Appears their stated age, well developed, well nourished in NAD. Skin: Warm, dry and intact. No rashes, lesions or ulcerations noted. HEENT: Head: normal shape and size; Eyes: sclera white, no icterus, conjunctiva pink, PERRLA and EOMs intact; Ears: Tm's gray and intact, normal light reflex; Nose: mucosa pink and moist, septum midline; Throat/Mouth: Teeth present, mucosa pink  and moist, no exudate, lesions or ulcerations noted.  Neck: Normal range of motion. Neck supple, trachea midline. No massses, lumps or thyromegaly present.  Cardiovascular: Normal rate and rhythm. S1,S2 noted.  No murmur, rubs or gallops noted. No JVD or BLE edema. No carotid bruits noted. Pulmonary/Chest: Normal effort and positive vesicular breath sounds. No respiratory distress. No wheezes, rales or ronchi noted.  Abdomen: Soft and nontender. Normal bowel sounds, no bruits noted. No distention or masses noted. Liver, spleen and kidneys non palpable. Musculoskeletal: Normal range of motion. No signs of joint swelling. No difficulty with gait.  Neurological: Alert and oriented to person and place, not time. Cranial nerves II-XII intact. Coordination  normal. +DTRs bilaterally. Psychiatric: Mood and affect normal. Behavior is normal. Judgment and thought content normal.     BMET    Component Value Date/Time   NA 141 04/16/2015 1016   K 4.0 04/16/2015 1016   CL 105 04/16/2015 1016   CO2 28 04/16/2015 1016   GLUCOSE 85 04/16/2015 1016   BUN 26* 04/16/2015 1016   CREATININE 1.52* 04/16/2015 1016   CALCIUM 8.9 04/16/2015 1016   GFRNONAA 50* 12/03/2014 0555   GFRAA 58* 12/03/2014 0555    Lipid Panel     Component Value Date/Time   CHOL 131 04/16/2015 1016   TRIG 81.0 04/16/2015 1016   HDL 50.30 04/16/2015 1016   CHOLHDL 3 04/16/2015 1016   VLDL 16.2 04/16/2015 1016   LDLCALC 65 04/16/2015 1016    CBC    Component Value Date/Time   WBC 8.7 04/16/2015 1016   RBC 4.09* 04/16/2015 1016   HGB 12.2* 04/16/2015 1016   HCT 37.1* 04/16/2015 1016   PLT 289.0 04/16/2015 1016   MCV 90.7 04/16/2015 1016   MCH 28.8 12/04/2014 0326   MCHC 33.0 04/16/2015 1016   RDW 16.1* 04/16/2015 1016   LYMPHSABS 1.4 07/15/2011 1111   MONOABS 0.5 07/15/2011 1111   EOSABS 0.4 07/15/2011 1111   BASOSABS 0.0 07/15/2011 1111    Hgb A1C Lab Results  Component Value Date   HGBA1C 6.0* 12/01/2014       Assessment and Plan:   Medicare Annual Wellness Visit:  Diet: Endorses fair diet. Breakfast: Cereal Lunch: Sandwiches Dinner: Frozen dinners. Snacks: Fruit Desserts: Occasional Beverages: Pepsi, water  Physical activity: No regular exercise, but is active around the house. Depression/mood screen: Negative Hearing: Intact to whispered voice Visual acuity: Grossly normal, performs annual eye exam  ADLs: Capable, requires some help from his son. Fall risk: None Home safety: Good, lives at home with his son. Cognitive evaluation: Could not recall 3 items. He has a difficult time understanding and following commands. He has a history of CVA in the past and I am having difficulty discerning if this is related to CVA or dementia. Will have patient schedule cogitative screening appointment in 1 month. EOL planning: He's unsure if this has been addressed. Will communicate with son.  Alert to person and place. Unsure of the month. He can describe the president but cannot recall his name.  Preventative Medicine: Flu vaccination provided today. Will set up cogitative screening appointment in 1 month to assess for dementia. Discussed healthy diet and exercise. Anticipatory guidance provided to patient, will also mention to son during phone call later this afternoon.  Next appointment: October 2nd for cogitative evaluation.

## 2015-04-22 NOTE — Assessment & Plan Note (Signed)
Vaccinations up to date, except for shingles. Do not see documentation, Will discuss with son later as patient was alone today. Influenza provided today. Suspect he may meet criteria for dementia, will do full cognitive exam next month with son present. Discussed healthy diet and exercise.  I have personally reviewed and have noted: 1. The patient's medical and social history 2. Their use of alcohol, tobacco or illicit drugs 3. Their current medications and supplements 4. The patient's functional ability including ADL's, fall risks, home safety risks and hearing or visual  impairment. 5. Diet and physical activities 6. Evidence for depression or mood disorder  Follow up in 1 month for cogitative eval.

## 2015-04-22 NOTE — Progress Notes (Signed)
Pre visit review using our clinic review tool, if applicable. No additional management support is needed unless otherwise documented below in the visit note. 

## 2015-04-22 NOTE — Patient Instructions (Addendum)
You've been provided with a flu shot today.  Please schedule a 30 minute follow up appointment with me in 1 month for a cognitive evaluation.  It was a pleasure to see you today!

## 2015-04-23 ENCOUNTER — Ambulatory Visit (INDEPENDENT_AMBULATORY_CARE_PROVIDER_SITE_OTHER): Payer: Medicare Other | Admitting: *Deleted

## 2015-04-23 DIAGNOSIS — R32 Unspecified urinary incontinence: Secondary | ICD-10-CM | POA: Insufficient documentation

## 2015-04-23 DIAGNOSIS — Z7901 Long term (current) use of anticoagulants: Secondary | ICD-10-CM

## 2015-04-23 NOTE — Assessment & Plan Note (Signed)
Patient endorses this although he is a poor historian and is alone during his visit. He presented to his visit with wash cloths lodged in his underwear. He has a difficult time explaining his symptoms but appear to sound like urinary incontinence, urge. Discussed this with patient's son who hasn't heard him mentioning this before. I am reticent to start another medication as the patient is already confused with his meds. His family plans to intervervene and assist with meds. He is to follow up in 1 month for re-evaluation. Will discuss this again with son present during next appointment.

## 2015-04-23 NOTE — Progress Notes (Signed)
Pre visit review using our clinic review tool, if applicable. No additional management support is needed unless otherwise documented below in the visit note. 

## 2015-04-23 NOTE — Assessment & Plan Note (Signed)
Discussed this with the patient's son. Son reports patient was diagnosed soon after his stroke. Patient had some difficulty following commands and understanding simple conversation during exam today. I highly recommended to his son that the patient should be transitioned to assisted living as he should not be living alone.  He and his family are to discuss this option and notify me of any changes.

## 2015-05-01 ENCOUNTER — Ambulatory Visit (INDEPENDENT_AMBULATORY_CARE_PROVIDER_SITE_OTHER): Payer: Medicare Other | Admitting: *Deleted

## 2015-05-01 ENCOUNTER — Telehealth: Payer: Self-pay | Admitting: *Deleted

## 2015-05-01 ENCOUNTER — Ambulatory Visit: Payer: Medicare Other

## 2015-05-01 DIAGNOSIS — Z7901 Long term (current) use of anticoagulants: Secondary | ICD-10-CM

## 2015-05-01 DIAGNOSIS — I639 Cerebral infarction, unspecified: Secondary | ICD-10-CM

## 2015-05-01 LAB — POCT INR: INR: 2.3

## 2015-05-01 NOTE — Progress Notes (Signed)
Pre visit review using our clinic review tool, if applicable. No additional management support is needed unless otherwise documented below in the visit note. 

## 2015-05-01 NOTE — Telephone Encounter (Signed)
Spoke to patient and rescheduled appointment for this afternoon at 2:30p

## 2015-05-02 ENCOUNTER — Telehealth: Payer: Self-pay | Admitting: Primary Care

## 2015-05-02 NOTE — Telephone Encounter (Signed)
Will you please call Logan Day, Logan Day son, to get him in the office on Monday. He will need a 30 minute appointment. I was thinking he could take the 3:45 and we could block the 4pm. He's expecting your call. Thanks.

## 2015-05-02 NOTE — Telephone Encounter (Signed)
Done. Schedule at 3:45 pm on 05/05/15

## 2015-05-02 NOTE — Telephone Encounter (Signed)
Pt son, Logan Day called requesting Anda Kraft give him a call regarding his father about his upcoming appt. Please advise.

## 2015-05-05 ENCOUNTER — Encounter: Payer: Self-pay | Admitting: Primary Care

## 2015-05-05 ENCOUNTER — Ambulatory Visit (INDEPENDENT_AMBULATORY_CARE_PROVIDER_SITE_OTHER): Payer: Medicare Other | Admitting: Primary Care

## 2015-05-05 ENCOUNTER — Encounter (INDEPENDENT_AMBULATORY_CARE_PROVIDER_SITE_OTHER): Payer: Self-pay

## 2015-05-05 ENCOUNTER — Ambulatory Visit (INDEPENDENT_AMBULATORY_CARE_PROVIDER_SITE_OTHER)
Admission: RE | Admit: 2015-05-05 | Discharge: 2015-05-05 | Disposition: A | Discharge status: Home / Self Care | Payer: Medicare Other | Source: Ambulatory Visit | Attending: Primary Care | Admitting: Primary Care

## 2015-05-05 VITALS — BP 126/64 | HR 61 | Temp 97.4°F | Ht 71.0 in | Wt 152.8 lb

## 2015-05-05 DIAGNOSIS — F0280 Dementia in other diseases classified elsewhere without behavioral disturbance: Secondary | ICD-10-CM | POA: Diagnosis not present

## 2015-05-05 DIAGNOSIS — I5042 Chronic combined systolic (congestive) and diastolic (congestive) heart failure: Secondary | ICD-10-CM | POA: Diagnosis not present

## 2015-05-05 DIAGNOSIS — R0602 Shortness of breath: Secondary | ICD-10-CM

## 2015-05-05 DIAGNOSIS — E119 Type 2 diabetes mellitus without complications: Secondary | ICD-10-CM | POA: Diagnosis not present

## 2015-05-05 LAB — POCT URINALYSIS DIPSTICK
BILIRUBIN UA: NEGATIVE
GLUCOSE UA: NEGATIVE
KETONES UA: NEGATIVE
LEUKOCYTES UA: NEGATIVE
Nitrite, UA: NEGATIVE
PROTEIN UA: NEGATIVE
SPEC GRAV UA: 1.025
Urobilinogen, UA: NEGATIVE
pH, UA: 6

## 2015-05-05 NOTE — Assessment & Plan Note (Signed)
EF of 40-45% in April with mitral valve regurgitation. Currently on beta blocker, no diuretic therapy. Increased SOB upon exertion with crackles noted at bases today. Will check kidney function and obtain chest xray. Will consider diuretic treatment. He has follow up with cards in the next month or two per his son.

## 2015-05-05 NOTE — Assessment & Plan Note (Addendum)
Cogitative evaluation preformed today with MMSE score of 10/30. This exam was difficult to administer as he has a history of expressive aphasia. Increased agitation and anger at home. Urinalysis negative in office today. Dicussed with patient and his son regarding assisted living as this is highly necessary for the future. Will consider treatment with Aricept after he follows with his neurologist.

## 2015-05-05 NOTE — Progress Notes (Signed)
Subjective:    Patient ID: Logan Day, male    DOB: 1933/06/18, 81 y.o.   MRN: [id number]  HPI  Logan Day is a 81 year old male who presents today for cogitative evaluation. He had an evaluation with his neurologist in 2016 and had a diagnosis of stroke induced dementia. Since then his son has noticed a gradual decline in functional and mental ability. He will get agitated and angry with family quite easily. He can function well at home by performing basic tasks. He can keep his house clean and will do mild cooking (microwave, toaster oven). He drives very minimally, but his son doesn't believe he's safe to do so. He cannot managed his medications. His other son lives with him but is not helpful around the house or with his fathers care. He see's Dr. Launa Grill with [facility] for the stroke.   2) Shortness of breath: Gradual for the past 2 weeks. Hospitalized in April 2016 and diagnosed with CHF. EF of 40-45% on Echo during that visit. He's had increased shortness of breath lately when walking to the mail box. He's currently managed on a beta blocker but is not on diuretic therapy. He follows with cardiology and has an appointment pending. Denies fevers, chills, cough.  Review of Systems  Constitutional: Negative for fever, chills and fatigue.  HENT: Positive for postnasal drip.   Respiratory: Positive for shortness of breath. Negative for cough.   Cardiovascular: Negative for chest pain.  Neurological: Negative for dizziness, weakness, numbness and headaches.  Psychiatric/Behavioral: Positive for confusion and agitation. Negative for suicidal ideas. The patient is not nervous/anxious.        Past Medical History  Diagnosis Date  . Hypertension   . Atrial fibrillation ([place])   . Hypercholesteremia   . MI (myocardial infarction) ([place])     x 2  . Angina   . Cancer [facility])     prostate cancer  . Coronary artery disease     Circ stent (2009 cath patent)  .  Prostate cancer ([place])   . Hyperlipidemia   . TIA (transient ischemic attack)   . GERD (gastroesophageal reflux disease)   . Shortness of breath dyspnea     Social History   Social History  . Marital Status: Widowed    Spouse Name: N/A  . Number of Children: N/A  . Years of Education: N/A   Occupational History  . Not on file.   Social History Main Topics  . Smoking status: Former Smoker -- 1.00 packs/day for 40 years  . Smokeless tobacco: Former Systems developer    Types: Chew     Comment: quit smoking 2002  . Alcohol Use: No  . Drug Use: No  . Sexual Activity: No   Other Topics Concern  . Not on file   Social History Narrative   Widower.   Retired. Worked for [employer].   Working outdoors.       Past Surgical History  Procedure Laterality Date  . Prostayectomy    . Fracture surgery    . Coronary angioplasty with stent placement  10/19/2012    DES  to circumflex  . Percutaneous coronary stent intervention (pci-s) N/A 10/19/2012    Procedure: PERCUTANEOUS CORONARY STENT INTERVENTION (PCI-S);  Surgeon: Sinclair Grooms, MD;  Location: [facility] CATH LAB;  Service: Cardiovascular;  Laterality: N/A;    Family History  Problem Relation Age of Onset  . Coronary artery disease Son   . Heart disease  Mother   . Heart disease Father     No Known Allergies  Current Outpatient Prescriptions on File Prior to Visit  Medication Sig Dispense Refill  . ALPRAZolam (XANAX) 0.5 MG tablet Take 1 tablet (0.5 mg total) by mouth at bedtime. 5 tablet 0  . amiodarone (PACERONE) 200 MG tablet Take 1 tablet (200 mg total) by mouth daily. 30 tablet 5  . aspirin EC 81 MG tablet Take 1 tablet (81 mg total) by mouth daily.    [name] buPROPion (WELLBUTRIN SR) 150 MG 12 hr tablet Take 150 mg by mouth 2 (two) times daily.     . feeding supplement, ENSURE ENLIVE, (ENSURE ENLIVE) LIQD Take 237 mLs by mouth 2 (two) times daily after a meal.    . metoprolol succinate (TOPROL-XL) 25 MG 24 hr tablet     . Multiple  Vitamin (MULTIVITAMIN WITH MINERALS) TABS tablet Take 1 tablet by mouth every evening.    . nitroGLYCERIN (NITROSTAT) 0.4 MG SL tablet Place 0.4 mg under the tongue every 5 (five) minutes as needed for chest pain.    [name] omeprazole (PRILOSEC) 20 MG capsule Take 20 mg by mouth every morning.    . pravastatin (PRAVACHOL) 40 MG tablet Take 40 mg by mouth every evening.    . sertraline (ZOLOFT) 100 MG tablet Take 100 mg by mouth every evening.    . traMADol (ULTRAM) 50 MG tablet Take 50 mg by mouth 2 (two) times daily as needed for pain.    [name] warfarin (COUMADIN) 4 MG tablet Take 2-4 mg by mouth every evening. Take half of a tablet (2mg ) on Tuesdays & Fridays. On all other days take a whole tablet (4mg ).     No current facility-administered medications on file prior to visit.    BP 126/64 mmHg  Pulse 61  Temp(Src) 97.4 F (36.3 C) (Oral)  Ht 5\' 11"  (1.803 m)  Wt 152 lb 12.8 oz (69.31 kg)  BMI 21.32 kg/m2  SpO2 95%    Objective:   Physical Exam  Constitutional: He appears well-nourished.  Eyes: EOM are normal. Pupils are equal, round, and reactive to light.  Cardiovascular: Normal rate and regular rhythm.   Pulmonary/Chest: Effort normal.  Mild crackles noted to bilateral bases.  Neurological: He is alert. He has normal reflexes. No cranial nerve deficit.  MMSE performed and scored 10/30. Also with a history of expressive aphasia from prior stroke. Exam difficulty to perform due to expressive aphasia.  Skin: Skin is warm and dry.  Psychiatric: He has a normal mood and affect.          Assessment & Plan:

## 2015-05-05 NOTE — Patient Instructions (Signed)
Complete lab work prior to leaving today. I will notify you of your results.  Complete xray(s) prior to leaving today. I will contact you regarding your results.  Follow up with Neurology as discussed. Please notify me of this visit. Follow up with Cardiology as discussed.  It's important to consider assisted living as it may be dangerous for you to live alone.  Follow up in 3 months.  It was a pleasure to see you today!

## 2015-05-06 ENCOUNTER — Other Ambulatory Visit: Payer: Self-pay | Admitting: Primary Care

## 2015-05-06 DIAGNOSIS — J449 Chronic obstructive pulmonary disease, unspecified: Secondary | ICD-10-CM

## 2015-05-06 LAB — BASIC METABOLIC PANEL
BUN: 30 mg/dL — AB (ref 6–23)
CHLORIDE: 106 meq/L (ref 96–112)
CO2: 27 mEq/L (ref 19–32)
Calcium: 8.9 mg/dL (ref 8.4–10.5)
Creatinine, Ser: 1.61 mg/dL — ABNORMAL HIGH (ref 0.40–1.50)
GFR: 44.12 mL/min — ABNORMAL LOW (ref >60.00)
Glucose, Bld: 111 mg/dL — ABNORMAL HIGH (ref 70–99)
POTASSIUM: 3.9 meq/L (ref 3.5–5.1)
Sodium: 140 mEq/L (ref 135–145)

## 2015-05-06 LAB — HEMOGLOBIN A1C: HEMOGLOBIN A1C: 5.7 % (ref 4.6–6.5)

## 2015-05-06 MED ORDER — ALBUTEROL SULFATE HFA 108 (90 BASE) MCG/ACT IN AERS: 1.0000 | INHALATION_SPRAY | Freq: Four times a day (QID) | RESPIRATORY_TRACT | Status: DC | PRN

## 2015-05-07 ENCOUNTER — Encounter: Payer: Self-pay | Admitting: *Deleted

## 2015-05-13 ENCOUNTER — Telehealth: Payer: Self-pay | Admitting: Primary Care

## 2015-05-13 NOTE — Telephone Encounter (Signed)
See previous phone note. Typically they will pick this up from the nursing home and drop it off for me to complete. Is this is what he's asking about?

## 2015-05-13 NOTE — Telephone Encounter (Signed)
[name] called and is requesting cb regarding FL2 form. Please call mobile number [phone] Thank you

## 2015-05-13 NOTE — Telephone Encounter (Signed)
Called Mr. Palmeri and spoke with him regarding the FL2 form. He will be dropping it by tomorrow.

## 2015-05-13 NOTE — Telephone Encounter (Signed)
Called patient's son. Notified him of Kate's comments. Patient's son will drop of the FL2 form to be fill out. He also would like to talk to [place], one on one regarding all this matter. If possible if Anda Kraft can call patient's son.

## 2015-05-14 ENCOUNTER — Telehealth: Payer: Self-pay | Admitting: Primary Care

## 2015-05-14 DIAGNOSIS — Z0279 Encounter for issue of other medical certificate: Secondary | ICD-10-CM

## 2015-05-14 NOTE — Telephone Encounter (Signed)
Form completed and placed in Chan's box.

## 2015-05-14 NOTE — Telephone Encounter (Signed)
Placed in [place] for completion.

## 2015-05-14 NOTE — Telephone Encounter (Signed)
Son did drop off FL2 form to be completed. Form in Kate's slot.

## 2015-05-14 NOTE — Telephone Encounter (Signed)
Call Back number [phone].

## 2015-05-14 NOTE — Telephone Encounter (Signed)
Called patient's son and notified him that form is ready for pick up.

## 2015-05-18 ENCOUNTER — Encounter (HOSPITAL_COMMUNITY): Payer: Self-pay | Admitting: Emergency Medicine

## 2015-05-18 ENCOUNTER — Emergency Department (HOSPITAL_COMMUNITY)
Admission: EM | Admit: 2015-05-18 | Discharge: 2015-05-20 | Disposition: A | Discharge status: Home / Self Care | Payer: Medicare Other | Attending: Emergency Medicine | Admitting: Emergency Medicine

## 2015-05-18 ENCOUNTER — Emergency Department (HOSPITAL_COMMUNITY): Payer: Medicare Other

## 2015-05-18 DIAGNOSIS — I25119 Atherosclerotic heart disease of native coronary artery with unspecified angina pectoris: Secondary | ICD-10-CM | POA: Diagnosis not present

## 2015-05-18 DIAGNOSIS — I10 Essential (primary) hypertension: Secondary | ICD-10-CM | POA: Insufficient documentation

## 2015-05-18 DIAGNOSIS — Z8673 Personal history of transient ischemic attack (TIA), and cerebral infarction without residual deficits: Secondary | ICD-10-CM | POA: Diagnosis not present

## 2015-05-18 DIAGNOSIS — Y9389 Activity, other specified: Secondary | ICD-10-CM | POA: Diagnosis not present

## 2015-05-18 DIAGNOSIS — Y9289 Other specified places as the place of occurrence of the external cause: Secondary | ICD-10-CM | POA: Diagnosis not present

## 2015-05-18 DIAGNOSIS — Y998 Other external cause status: Secondary | ICD-10-CM | POA: Insufficient documentation

## 2015-05-18 DIAGNOSIS — F329 Major depressive disorder, single episode, unspecified: Secondary | ICD-10-CM | POA: Insufficient documentation

## 2015-05-18 DIAGNOSIS — Z7982 Long term (current) use of aspirin: Secondary | ICD-10-CM | POA: Diagnosis not present

## 2015-05-18 DIAGNOSIS — E7800 Pure hypercholesterolemia, unspecified: Secondary | ICD-10-CM | POA: Insufficient documentation

## 2015-05-18 DIAGNOSIS — Z87891 Personal history of nicotine dependence: Secondary | ICD-10-CM | POA: Diagnosis not present

## 2015-05-18 DIAGNOSIS — I509 Heart failure, unspecified: Secondary | ICD-10-CM | POA: Insufficient documentation

## 2015-05-18 DIAGNOSIS — R45851 Suicidal ideations: Secondary | ICD-10-CM

## 2015-05-18 DIAGNOSIS — Z79899 Other long term (current) drug therapy: Secondary | ICD-10-CM | POA: Diagnosis not present

## 2015-05-18 DIAGNOSIS — Z049 Encounter for examination and observation for unspecified reason: Secondary | ICD-10-CM

## 2015-05-18 DIAGNOSIS — Y288XXA Contact with other sharp object, undetermined intent, initial encounter: Secondary | ICD-10-CM | POA: Diagnosis not present

## 2015-05-18 DIAGNOSIS — S51802A Unspecified open wound of left forearm, initial encounter: Secondary | ICD-10-CM | POA: Insufficient documentation

## 2015-05-18 DIAGNOSIS — Z23 Encounter for immunization: Secondary | ICD-10-CM | POA: Insufficient documentation

## 2015-05-18 DIAGNOSIS — I252 Old myocardial infarction: Secondary | ICD-10-CM | POA: Insufficient documentation

## 2015-05-18 DIAGNOSIS — Z7901 Long term (current) use of anticoagulants: Secondary | ICD-10-CM | POA: Diagnosis not present

## 2015-05-18 DIAGNOSIS — F331 Major depressive disorder, recurrent, moderate: Secondary | ICD-10-CM | POA: Diagnosis present

## 2015-05-18 DIAGNOSIS — IMO0002 Reserved for concepts with insufficient information to code with codable children: Secondary | ICD-10-CM

## 2015-05-18 DIAGNOSIS — K219 Gastro-esophageal reflux disease without esophagitis: Secondary | ICD-10-CM | POA: Insufficient documentation

## 2015-05-18 DIAGNOSIS — Z7289 Other problems related to lifestyle: Secondary | ICD-10-CM

## 2015-05-18 DIAGNOSIS — J449 Chronic obstructive pulmonary disease, unspecified: Secondary | ICD-10-CM | POA: Insufficient documentation

## 2015-05-18 DIAGNOSIS — F332 Major depressive disorder, recurrent severe without psychotic features: Secondary | ICD-10-CM | POA: Diagnosis present

## 2015-05-18 DIAGNOSIS — Z9861 Coronary angioplasty status: Secondary | ICD-10-CM | POA: Diagnosis not present

## 2015-05-18 DIAGNOSIS — Z8546 Personal history of malignant neoplasm of prostate: Secondary | ICD-10-CM | POA: Diagnosis not present

## 2015-05-18 DIAGNOSIS — I4891 Unspecified atrial fibrillation: Secondary | ICD-10-CM | POA: Insufficient documentation

## 2015-05-18 DIAGNOSIS — T1491 Suicide attempt: Secondary | ICD-10-CM | POA: Diagnosis present

## 2015-05-18 DIAGNOSIS — F0391 Unspecified dementia with behavioral disturbance: Secondary | ICD-10-CM | POA: Diagnosis present

## 2015-05-18 DIAGNOSIS — F32A Depression, unspecified: Secondary | ICD-10-CM

## 2015-05-18 DIAGNOSIS — F03918 Unspecified dementia, unspecified severity, with other behavioral disturbance: Secondary | ICD-10-CM | POA: Diagnosis present

## 2015-05-18 HISTORY — DX: Heart failure, unspecified: I50.9

## 2015-05-18 HISTORY — DX: Unspecified dementia, unspecified severity, without behavioral disturbance, psychotic disturbance, mood disturbance, and anxiety: F03.90

## 2015-05-18 HISTORY — DX: Chronic obstructive pulmonary disease, unspecified: J44.9

## 2015-05-18 LAB — COMPREHENSIVE METABOLIC PANEL
ALK PHOS: 73 U/L (ref 38–126)
ALT: 30 U/L (ref 17–63)
AST: 33 U/L (ref 15–41)
Albumin: 3.4 g/dL — ABNORMAL LOW (ref 3.5–5.0)
Anion gap: 6 (ref 5–15)
BILIRUBIN TOTAL: 0.9 mg/dL (ref 0.3–1.2)
BUN: 32 mg/dL — ABNORMAL HIGH (ref 6–20)
CALCIUM: 8.8 mg/dL — AB (ref 8.9–10.3)
CO2: 26 mmol/L (ref 22–32)
CREATININE: 1.64 mg/dL — AB (ref 0.61–1.24)
Chloride: 106 mmol/L (ref 101–111)
GFR, EST AFRICAN AMERICAN: 44 mL/min — AB (ref >60)
GFR, EST NON AFRICAN AMERICAN: 38 mL/min — AB (ref >60)
Glucose, Bld: 108 mg/dL — ABNORMAL HIGH (ref 65–99)
Potassium: 3.9 mmol/L (ref 3.5–5.1)
Sodium: 138 mmol/L (ref 135–145)
TOTAL PROTEIN: 8 g/dL (ref 6.5–8.1)

## 2015-05-18 LAB — CBC WITH DIFFERENTIAL/PLATELET
BASOS ABS: 0 10*3/uL (ref 0.0–0.1)
Basophils Relative: 0 %
EOS PCT: 4 %
Eosinophils Absolute: 0.3 10*3/uL (ref 0.0–0.7)
HCT: 36.8 % — ABNORMAL LOW (ref 39.0–52.0)
HEMOGLOBIN: 12.1 g/dL — AB (ref 13.0–17.0)
LYMPHS ABS: 0.8 10*3/uL (ref 0.7–4.0)
Lymphocytes Relative: 8 %
MCH: 30.6 pg (ref 26.0–34.0)
MCHC: 32.9 g/dL (ref 30.0–36.0)
MCV: 92.9 fL (ref 78.0–100.0)
MONOS PCT: 6 %
Monocytes Absolute: 0.6 10*3/uL (ref 0.1–1.0)
NEUTROS PCT: 82 %
Neutro Abs: 7.7 10*3/uL (ref 1.7–7.7)
PLATELETS: 290 10*3/uL (ref 150–400)
RBC: 3.96 MIL/uL — AB (ref 4.22–5.81)
RDW: 15.6 % — ABNORMAL HIGH (ref 11.5–15.5)
WBC: 9.4 10*3/uL (ref 4.0–10.5)

## 2015-05-18 LAB — URINALYSIS, ROUTINE W REFLEX MICROSCOPIC
BILIRUBIN URINE: NEGATIVE
Glucose, UA: NEGATIVE mg/dL
Ketones, ur: NEGATIVE mg/dL
Leukocytes, UA: NEGATIVE
Nitrite: NEGATIVE
Protein, ur: NEGATIVE mg/dL
SPECIFIC GRAVITY, URINE: 1.019 (ref 1.005–1.030)
UROBILINOGEN UA: 1 mg/dL (ref 0.0–1.0)
pH: 6 (ref 5.0–8.0)

## 2015-05-18 LAB — PROTIME-INR
INR: 4.23 — ABNORMAL HIGH (ref 0.00–1.49)
PROTHROMBIN TIME: 39.6 s — AB (ref 11.6–15.2)

## 2015-05-18 LAB — ACETAMINOPHEN LEVEL: Acetaminophen (Tylenol), Serum: <10 ug/mL — ABNORMAL LOW (ref 10–30)

## 2015-05-18 LAB — RAPID URINE DRUG SCREEN, HOSP PERFORMED
Amphetamines: NOT DETECTED
BARBITURATES: NOT DETECTED
Benzodiazepines: NOT DETECTED
Cocaine: NOT DETECTED
Opiates: NOT DETECTED
Tetrahydrocannabinol: NOT DETECTED

## 2015-05-18 LAB — URINE MICROSCOPIC-ADD ON

## 2015-05-18 LAB — ETHANOL

## 2015-05-18 LAB — SALICYLATE LEVEL

## 2015-05-18 MED ORDER — SERTRALINE HCL 50 MG PO TABS
100.0000 mg | ORAL_TABLET | Freq: Every evening | ORAL | Status: DC
Start: 2015-05-18 — End: 2015-05-20
  Administered 2015-05-18 – 2015-05-19 (×2): 100 mg via ORAL
  Filled 2015-05-18 (×2): qty 2

## 2015-05-18 MED ORDER — TETANUS-DIPHTH-ACELL PERTUSSIS 5-2.5-18.5 LF-MCG/0.5 IM SUSP
0.5000 mL | Freq: Once | INTRAMUSCULAR | Status: AC
  Administered 2015-05-18: 0.5 mL via INTRAMUSCULAR
  Filled 2015-05-18: qty 0.5

## 2015-05-18 MED ORDER — ASPIRIN EC 81 MG PO TBEC
81.0000 mg | DELAYED_RELEASE_TABLET | Freq: Every day | ORAL | Status: DC
  Administered 2015-05-19 – 2015-05-20 (×2): 81 mg via ORAL
  Filled 2015-05-18 (×2): qty 1

## 2015-05-18 MED ORDER — AMIODARONE HCL 200 MG PO TABS
200.0000 mg | ORAL_TABLET | Freq: Every day | ORAL | Status: DC
  Administered 2015-05-19 – 2015-05-20 (×2): 200 mg via ORAL
  Filled 2015-05-18 (×2): qty 1

## 2015-05-18 MED ORDER — BUPROPION HCL ER (SR) 150 MG PO TB12
150.0000 mg | ORAL_TABLET | Freq: Two times a day (BID) | ORAL | Status: DC
  Administered 2015-05-18 – 2015-05-19 (×2): 150 mg via ORAL
  Filled 2015-05-18 (×3): qty 1

## 2015-05-18 MED ORDER — WARFARIN SODIUM 2 MG PO TABS: 2.0000 mg | ORAL_TABLET | Freq: Every evening | ORAL | Status: DC

## 2015-05-18 MED ORDER — PANTOPRAZOLE SODIUM 40 MG PO TBEC
40.0000 mg | DELAYED_RELEASE_TABLET | Freq: Every day | ORAL | Status: DC
  Administered 2015-05-18 – 2015-05-20 (×3): 40 mg via ORAL
  Filled 2015-05-18 (×3): qty 1

## 2015-05-18 MED ORDER — METOPROLOL SUCCINATE ER 25 MG PO TB24
25.0000 mg | ORAL_TABLET | Freq: Two times a day (BID) | ORAL | Status: DC
  Filled 2015-05-18 (×4): qty 1

## 2015-05-18 MED ORDER — WARFARIN - PHARMACIST DOSING INPATIENT: Freq: Every day | Status: DC

## 2015-05-18 MED ORDER — ALBUTEROL SULFATE HFA 108 (90 BASE) MCG/ACT IN AERS
1.0000 | INHALATION_SPRAY | Freq: Four times a day (QID) | RESPIRATORY_TRACT | Status: DC | PRN
  Filled 2015-05-18: qty 6.7

## 2015-05-18 NOTE — ED Notes (Signed)
Pts [name] is taking home Pts clothing and his metal watch.

## 2015-05-18 NOTE — ED Notes (Signed)
Pt has 2 sons with him.  Logan Day lives with Pt and can be reached at [phone].  His other son is Logan Day and he be reached at [phone].

## 2015-05-18 NOTE — ED Notes (Signed)
Security scanned Pt (and his sons) and belongings with wand.  Pt dressed in paper scrubs.  Pts left forearm wounds cleaned and bandaged.

## 2015-05-18 NOTE — Progress Notes (Signed)
ANTICOAGULATION CONSULT NOTE - Initial Consult  Pharmacy Consult for Warfarin Indication: atrial fibrillation  No Known Allergies  Patient Measurements: Height: 5\' 11"  (180.3 cm) Weight: 146 lb (66.225 kg) IBW/kg (Calculated) : 75.3  Vital Signs: Temp: 97.7 F (36.5 C) (10/16 1342) Temp Source: Oral (10/16 1342) BP: 120/70 mmHg (10/16 1342) Pulse Rate: 55 (10/16 1342)  Labs:  Recent Labs  05/18/15 1443 05/18/15 1625  HGB 12.1*  --   HCT 36.8*  --   PLT 290  --   LABPROT  --  39.6*  INR  --  4.23*  CREATININE 1.64*  --     Estimated Creatinine Clearance: 34.2 mL/min (by C-G formula based on Cr of 1.64).   Medical History: Past Medical History  Diagnosis Date  . Hypertension   . Atrial fibrillation ([place])   . Hypercholesteremia   . MI (myocardial infarction) ([place])     x 2  . Angina   . Cancer [facility])     prostate cancer  . Coronary artery disease     Circ stent (2009 cath patent)  . Prostate cancer ([place])   . Hyperlipidemia   . TIA (transient ischemic attack)   . GERD (gastroesophageal reflux disease)   . Shortness of breath dyspnea   . COPD (chronic obstructive pulmonary disease) ([place])   . Dementia   . Congestive heart failure ([facility])     Medications:  Scheduled:  . [START ON 05/19/2015] amiodarone  200 mg Oral Daily  . [START ON 05/19/2015] aspirin EC  81 mg Oral Daily  . buPROPion  150 mg Oral BID  . metoprolol succinate  25 mg Oral BID  . pantoprazole  40 mg Oral Daily  . sertraline  100 mg Oral QPM   Infusions:    Assessment: 53 yoM admitted 10/16 after suicide attempt w/ lacerations to his forearm. He takes warfarin for paroxysmal a.fib. Pharmacy is asked to manage while inpatient. Home regimen reported as 2mg  daily except 4mg  on Tues & Thurs. Last taken on 10/15.  Today, 05/18/2015: INR 4.23, supratherapeutic. CBC:  Hgb 12.1 (near baseline with previous values ~ 12) and Plt WNL. Drug-drug interactions: home medications include amiodarone and  aspirin 81 mg  Goal of Therapy:  INR 2-3 Monitor platelets by anticoagulation protocol: Yes   Plan:  Hold warfarin dose today. Daily INR and CBC.  Gretta Arab PharmD, BCPS Pager [phone] 05/18/2015 6:06 PM

## 2015-05-18 NOTE — ED Notes (Signed)
Pt sleeping.  Protonix held.

## 2015-05-18 NOTE — BH Assessment (Signed)
Tele Assessment Note   Logan Day is an 81 y.o. male. Pt presents voluntarily to [place] BIB his [name] with whom he lives. Pt is cooperative. He is oriented to self and situation only. Pt is wearing scrubs and has a large bandage on his L forearm. Per chart review, pt has approx 20 self inflicted lacerations on L forearm. Pt reports he tried to kill himself by cutting his forearm this am. He continues to endorse SI. Pt says, "I'd like to get rid of my own self." His speech is logical, coherent and circumstantial at times. He endorses irritability and hopelessness. He endorses hopelessness and irritability. Pt denies HI. He denies [facility] and no delusions noted. Pt sts he was arguing with his two sons about about whether his phone worked. Per chart review, pt was dx with dementia this year. Pt appears a bit confused during teleassessment. He is pleasant. He sts more than once that it was his fault that he argues with his sons. Pt says he "wants to die" so he "won't bother" his sons. Collateral info provided by pt's [name] [phone]. He reports, "In the last three weeks, it seems like he (patient) has just given." Son reports pt's wife died of cancer 5 yrs ago and pt had a stroke approx 5 yrs ago. He says pt was taking Zoloft for a few years after wife's death. Son reports pt has no short term memory and is often angry with himself.   Diagnosis: Dementia  Past Medical History:  Past Medical History  Diagnosis Date  . Hypertension   . Atrial fibrillation ([place])   . Hypercholesteremia   . MI (myocardial infarction) ([facility])     x 2  . Angina   . Cancer [facility])     prostate cancer  . Coronary artery disease     Circ stent (2009 cath patent)  . Prostate cancer ([place])   . Hyperlipidemia   . TIA (transient ischemic attack)   . GERD (gastroesophageal reflux disease)   . Shortness of breath dyspnea   . COPD (chronic obstructive pulmonary disease) ([place])   . Dementia   . Congestive heart  failure [facility])     Past Surgical History  Procedure Laterality Date  . Prostayectomy    . Fracture surgery    . Coronary angioplasty with stent placement  10/19/2012    DES  to circumflex  . Percutaneous coronary stent intervention (pci-s) N/A 10/19/2012    Procedure: PERCUTANEOUS CORONARY STENT INTERVENTION (PCI-S);  Surgeon: Sinclair Grooms, MD;  Location: [facility] C[facility] LAB;  Service: Cardiovascular;  Laterality: N/A;    Family History:  Family History  Problem Relation Age of Onset  . Coronary artery disease Son   . Heart disease Mother   . Heart disease Father     Social History:  reports that he has quit smoking. He has quit using smokeless tobacco. His smokeless tobacco use included Chew. He reports that he does not drink alcohol or use illicit drugs.  Additional Social History:  Alcohol / Drug Use Pain Medications: pt denies abuse - see PTA meds list Prescriptions: pt denies abuse- see pTA meds list Over the Counter: pt denies abuse - see PTA meds list History of alcohol / drug use?: No history of alcohol / drug abuse  CIWA: CIWA-Ar BP: 120/70 mmHg Pulse Rate: (!) 55 COWS:    PATIENT STRENGTHS: (choose at least two) Average or above average intelligence Communication skills Supportive family/friends  Allergies:  No Known Allergies  Home Medications:  (Not in a hospital admission)  OB/GYN Status:  No LMP for male patient.  General Assessment Data [facility] of [facility]: [facility] TTS Assessment: In system Is this a Tele or Face-to-Face Assessment?: Tele Assessment Is this an Initial Assessment or a Re-assessment for this encounter?: Initial Assessment Marital status: Widowed Living Arrangements: Children (son - Quarry manager) Can pt return to current living arrangement?: Yes Admission Status: Voluntary Is patient capable of signing voluntary admission?: Yes Referral Source: Self/Family/Friend Insurance type: [place] [facility]  Arrangements: Children (son - Quarry manager) Name of Psychiatrist: none Name of Therapist: none  Education Status Is patient currently in school?: No Highest grade of school patient has completed: 12  Risk to self with the past 6 months Suicidal Ideation: Yes-Currently Present Has patient been a risk to self within the past 6 months prior to admission? : Yes Suicidal Intent: Yes-Currently Present Has patient had any suicidal intent within the past 6 months prior to admission? : Yes Is patient at risk for suicide?: Yes Suicidal Plan?: Yes-Currently Present Has patient had any suicidal plan within the past 6 months prior to admission? : Yes Specify Current Suicidal Plan: pt made several lacerations to L arm in suicide attempt today Access to Means: Yes Specify Access to Suicidal Means: access to sharps What has been your use of drugs/alcohol within the last 12 months?: none Previous Attempts/Gestures: No (none prior to today) How many times?: 0 Other Self Harm Risks: none Triggers for Past Attempts:  (n/a) Intentional Self Injurious Behavior: None Family Suicide History: Unknown Recent stressful life event(s): Other (Comment) (angry at himself for arguing with sons) Persecutory voices/beliefs?: No Depression: Yes Depression Symptoms: Despondent, Feeling angry/irritable Substance abuse history and/or treatment for substance abuse?: No Suicide prevention information given to non-admitted patients: Not applicable  Risk to Others within the past 6 months Homicidal Ideation: No Does patient have any lifetime risk of violence toward others beyond the six months prior to admission? : No Thoughts of Harm to Others: No Current Homicidal Intent: No Current Homicidal Plan: No Access to Homicidal Means: No Identified Victim: none History of harm to others?: No Assessment of Violence: None Noted Violent Behavior Description: pt denies hx violence Does patient have access to weapons?:  No Criminal Charges Pending?: No Does patient have a court date: No Is patient on probation?: No  Psychosis Hallucinations: None noted Delusions: None noted  Mental Status Report Appearance/Hygiene: Unremarkable, In scrubs Eye Contact: Good Motor Activity: Freedom of movement Speech: Logical/coherent Level of Consciousness: Alert Mood: Irritable, Depressed Affect: Appropriate to circumstance, Depressed Anxiety Level: Minimal Thought Processes: Relevant, Coherent, Circumstantial Judgement: Impaired Orientation: Situation, Person Obsessive Compulsive Thoughts/Behaviors: None  Cognitive Functioning Concentration: Decreased Memory: Recent Impaired, Remote Impaired IQ: Average Insight: Fair Impulse Control: Poor Appetite: Fair Sleep: No Change Total Hours of Sleep: 7 Vegetative Symptoms: None  ADLScreening [facility] Assessment Services) Patient's cognitive ability adequate to safely complete daily activities?: Yes Patient able to express need for assistance with ADLs?: Yes Independently performs ADLs?: Yes (appropriate for developmental age)  Prior Inpatient Therapy Prior Inpatient Therapy: No Prior Therapy Dates: na Prior Therapy Facilty/Provider(s): na Reason for Treatment: na  Prior Outpatient Therapy Prior Outpatient Therapy: No Prior Therapy Dates: na Prior Therapy Facilty/Provider(s): na Reason for Treatment: na Does patient have an ACCT team?: No Does patient have Intensive In-House Services?  : No Does patient have [facility] services? : No Does patient  have P4CC services?: No  ADL Screening (condition at time of admission) Patient's cognitive ability adequate to safely complete daily activities?: Yes Is the patient deaf or have difficulty hearing?: No Does the patient have difficulty seeing, even when wearing glasses/contacts?: No Does the patient have difficulty concentrating, remembering, or making decisions?: Yes Patient able to express need for assistance  with ADLs?: Yes Does the patient have difficulty dressing or bathing?: No Independently performs ADLs?: Yes (appropriate for developmental age) Does the patient have difficulty walking or climbing stairs?: No Weakness of Legs: None Weakness of Arms/Hands: None  Home Assistive Devices/Equipment Home Assistive Devices/Equipment: Eyeglasses    Abuse/Neglect Assessment (Assessment to be complete while patient is alone) Physical Abuse: Denies Verbal Abuse: Denies Sexual Abuse: Denies Exploitation of patient/patient's resources: Denies Self-Neglect: Denies     Regulatory affairs officer (For Healthcare) Does patient have an advance directive?: Yes Type of Advance Directive: Healthcare Power of [place], Living will (Pt is a DNR per sons [name] & Esdras Delair) Does patient want to make changes to advanced directive?: No - Patient declined Copy of advanced directive(s) in chart?: No - copy requested    Additional Information 1:1 In Past 12 Months?: No CIRT Risk: No Elopement Risk: No Does patient have medical clearance?: Yes     Disposition:  Disposition Initial Assessment Completed for this Encounter: Yes Disposition of Patient: Inpatient treatment program Type of inpatient treatment program: Adult Charmaine Downs NP rec geropsych inpatient placement)  Tjuana Vickrey P 05/18/2015 4:39 PM

## 2015-05-18 NOTE — ED Notes (Signed)
Upon assessment by RN, Pt found to have several (at least 20), self inflicted stab wounds to his Left Forearm that were done at about 0300 today with a kitchen knife and a box cutter.  Bleeding is controlled.  Wounds cleaned and new dressing applied by RN.

## 2015-05-18 NOTE — ED Notes (Signed)
Unable to collect labs at this time patient talking with TTS.

## 2015-05-18 NOTE — ED Notes (Addendum)
Pt has been talking about wanting to die with no prior attempt known to family.  No loss of support systems or precipitating factors known.  Pt recently Dx with Dementia and family is beginning process of getting Pt into some assisted care facility.  Pt has several self inflicted LACs to left forearm made  With kitchen knife and box cutter.

## 2015-05-18 NOTE — ED Notes (Signed)
MD at bedside. 

## 2015-05-18 NOTE — ED Provider Notes (Addendum)
CSN: [id number]     Arrival date & time 05/18/15  1326 History   First MD Initiated Contact with Patient 05/18/15 1500     Chief Complaint  Patient presents with  . Suicide Attempt     (Consider location/radiation/quality/duration/timing/severity/associated sxs/prior Treatment) The history is provided by the patient.  Patient w hx htn, a fib, cad, dementia, cva, presents w worsening depression, and new feelings of self harm, SI, not wanting to be here anymore.  Yesterday evening he stabbed self in left forearm w box cutter multiple times, minimal blood loss. Tetanus unknown. Denies other pain or injury. No OD. No hand or arm numbness/weakness, or loss of function. Decreased appetite. Pt indicates lost his spouse several years ago, frustrated by being able to do less, dependency on child who 'tell me what to do too much'.   Pt denies any acute physical problems, recent illness, or new symptoms. States compliant w normal meds.     Past Medical History  Diagnosis Date  . Hypertension   . Atrial fibrillation ([place])   . Hypercholesteremia   . MI (myocardial infarction) ([place])     x 2  . Angina   . Cancer [facility])     prostate cancer  . Coronary artery disease     Circ stent (2009 cath patent)  . Prostate cancer ([place])   . Hyperlipidemia   . TIA (transient ischemic attack)   . GERD (gastroesophageal reflux disease)   . Shortness of breath dyspnea   . COPD (chronic obstructive pulmonary disease) ([place])   . Dementia   . Congestive heart failure [facility])    Past Surgical History  Procedure Laterality Date  . Prostayectomy    . Fracture surgery    . Coronary angioplasty with stent placement  10/19/2012    DES  to circumflex  . Percutaneous coronary stent intervention (pci-s) N/A 10/19/2012    Procedure: PERCUTANEOUS CORONARY STENT INTERVENTION (PCI-S);  Surgeon: Sinclair Grooms, MD;  Location: [facility] CATH LAB;  Service: Cardiovascular;  Laterality: N/A;   Family History  Problem Relation Age  of Onset  . Coronary artery disease Son   . Heart disease Mother   . Heart disease Father    Social History  Substance Use Topics  . Smoking status: Former Smoker -- 1.00 packs/day for 40 years  . Smokeless tobacco: Former Systems developer    Types: Chew     Comment: quit smoking 2002  . Alcohol Use: No    Review of Systems  Constitutional: Negative for fever and chills.  HENT: Negative for sore throat.   Eyes: Negative for redness.  Respiratory: Negative for shortness of breath.   Cardiovascular: Negative for chest pain.  Gastrointestinal: Negative for vomiting, abdominal pain and diarrhea.  Genitourinary: Negative for flank pain.  Musculoskeletal: Negative for back pain and neck pain.  Skin: Positive for wound.  Neurological: Negative for headaches.  Hematological: Does not bruise/bleed easily.  Psychiatric/Behavioral: Positive for suicidal ideas and dysphoric mood.      Allergies  Review of patient's allergies indicates no known allergies.  Home Medications   Prior to Admission medications   Medication Sig Start Date End Date Taking? Authorizing Provider  albuterol (PROAIR HFA) 108 (90 BASE) MCG/ACT inhaler Inhale 1-2 puffs into the lungs every 6 (six) hours as needed for wheezing or shortness of breath. 05/06/15  Yes Pleas Koch, NP  ALPRAZolam Duanne Moron) 0.5 MG tablet Take 1 tablet (0.5 mg total) by mouth at bedtime. Patient taking differently: Take 0.5  mg by mouth at bedtime as needed for sleep.  12/04/14  Yes Modena Jansky, MD  amiodarone (PACERONE) 200 MG tablet Take 1 tablet (200 mg total) by mouth daily. 01/22/15  Yes Belva Crome, MD  aspirin EC 81 MG tablet Take 1 tablet (81 mg total) by mouth daily. 12/18/13  Yes Belva Crome, MD  buPROPion [facility] SR) 150 MG 12 hr tablet Take 150 mg by mouth 2 (two) times daily.  08/01/14  Yes Historical Provider, MD  feeding supplement, ENSURE ENLIVE, (ENSURE ENLIVE) LIQD Take 237 mLs by mouth 2 (two) times daily after a  meal. Patient taking differently: Take 237 mLs by mouth daily.  12/04/14  Yes Modena Jansky, MD  metoprolol succinate (TOPROL-XL) 25 MG 24 hr tablet Take 25 mg by mouth 2 (two) times daily.  01/03/15  Yes Historical Provider, MD  Multiple Vitamin (MULTIVITAMIN WITH MINERALS) TABS tablet Take 1 tablet by mouth every evening.   Yes Historical Provider, MD  nitroGLYCERIN (NITROSTAT) 0.4 MG SL tablet Place 0.4 mg under the tongue every 5 (five) minutes as needed for chest pain.   Yes Historical Provider, MD  omeprazole (PRILOSEC) 20 MG capsule Take 20 mg by mouth every morning.   Yes Historical Provider, MD  pravastatin (PRAVACHOL) 40 MG tablet Take 40 mg by mouth every evening.   Yes Historical Provider, MD  sertraline (ZOLOFT) 100 MG tablet Take 100 mg by mouth every evening.   Yes Historical Provider, MD  traMADol (ULTRAM) 50 MG tablet Take 50 mg by mouth 2 (two) times daily as needed for pain.   Yes Historical Provider, MD  warfarin (COUMADIN) 4 MG tablet Take 2-4 mg by mouth every evening. Take half of a tablet (2mg ) on Tuesdays & Fridays. On all other days take a whole tablet (4mg ).   Yes Historical Provider, MD   BP 120/70 mmHg  Pulse 55  Temp(Src) 97.7 F (36.5 C) (Oral)  Ht 5\' 11"  (1.803 m)  Wt 146 lb (66.225 kg)  BMI 20.37 kg/m2  SpO2 92% Physical Exam  Constitutional: He appears well-developed and well-nourished. No distress.  HENT:  Head: Atraumatic.  Mouth/Throat: Oropharynx is clear and moist.  Eyes: Conjunctivae are normal. Pupils are equal, round, and reactive to light. No scleral icterus.  Neck: Neck supple. No tracheal deviation present. No thyromegaly present.  Cardiovascular: Normal rate, regular rhythm, normal heart sounds and intact distal pulses.   Pulmonary/Chest: Effort normal and breath sounds normal. No accessory muscle usage. No respiratory distress.  Abdominal: Soft. Bowel sounds are normal. He exhibits no distension.  Musculoskeletal: Normal range of motion. He  exhibits no edema.  Multiple small, superficial, linear wounds to left forearm c/w hx of self inflicted wounds w box cutter. No active bleeding. Wound edges well approximated. No fb seen or felt. Radial pulse 2+. No sign of infection.   Neurological: He is alert.  Speech clear/fluent. LUE R/M/U nerve fxn, motor/sens intact.   Skin: Skin is warm and dry. He is not diaphoretic.  Psychiatric:  Depressed mood, flat affect. +SI.    Nursing note and vitals reviewed.   ED Course  Procedures (including critical care time) Labs Review  Results for orders placed or performed during the hospital encounter of 05/18/15  CBC with Differential  Result Value Ref Range   WBC 9.4 4.0 - 10.5 K/uL   RBC 3.96 (L) 4.22 - 5.81 MIL/uL   Hemoglobin 12.1 (L) 13.0 - 17.0 g/dL   HCT 36.8 (L) 39.0 -  52.0 %   MCV 92.9 78.0 - 100.0 fL   MCH 30.6 26.0 - 34.0 pg   MCHC 32.9 30.0 - 36.0 g/dL   RDW 15.6 (H) 11.5 - 15.5 %   Platelets 290 150 - 400 K/uL   Neutrophils Relative % 82 %   Neutro Abs 7.7 1.7 - 7.7 K/uL   Lymphocytes Relative 8 %   Lymphs Abs 0.8 0.7 - 4.0 K/uL   Monocytes Relative 6 %   Monocytes Absolute 0.6 0.1 - 1.0 K/uL   Eosinophils Relative 4 %   Eosinophils Absolute 0.3 0.0 - 0.7 K/uL   Basophils Relative 0 %   Basophils Absolute 0.0 0.0 - 0.1 K/uL  Comprehensive metabolic panel  Result Value Ref Range   Sodium 138 135 - 145 mmol/L   Potassium 3.9 3.5 - 5.1 mmol/L   Chloride 106 101 - 111 mmol/L   CO2 26 22 - 32 mmol/L   Glucose, Bld 108 (H) 65 - 99 mg/dL   BUN 32 (H) 6 - 20 mg/dL   Creatinine, Ser 1.64 (H) 0.61 - 1.24 mg/dL   Calcium 8.8 (L) 8.9 - 10.3 mg/dL   Total Protein 8.0 6.5 - 8.1 g/dL   Albumin 3.4 (L) 3.5 - 5.0 g/dL   AST 33 15 - 41 U/L   ALT 30 17 - 63 U/L   Alkaline Phosphatase 73 38 - 126 U/L   Total Bilirubin 0.9 0.3 - 1.2 mg/dL   GFR calc non Af Amer 38 (L) >60 mL/min   GFR calc Af Amer 44 (L) >60 mL/min   Anion gap 6 5 - 15  Ethanol  Result Value Ref Range    Alcohol, Ethyl (B) <5 <5 mg/dL  Salicylate level  Result Value Ref Range   Salicylate Lvl <7.8 2.8 - 30.0 mg/dL  Acetaminophen level  Result Value Ref Range   Acetaminophen (Tylenol), Serum <10 (L) 10 - 30 ug/mL  Urine rapid drug screen (hosp performed)  Result Value Ref Range   Opiates NONE DETECTED NONE DETECTED   Cocaine NONE DETECTED NONE DETECTED   Benzodiazepines NONE DETECTED NONE DETECTED   Amphetamines NONE DETECTED NONE DETECTED   Tetrahydrocannabinol NONE DETECTED NONE DETECTED   Barbiturates NONE DETECTED NONE DETECTED   Dg Chest 2 View  05/18/2015  CLINICAL DATA:  COPD EXAM: CHEST  2 VIEW COMPARISON:  05/05/2015 FINDINGS: Cardiomediastinal silhouette is stable. Hyperinflation again noted. Stable bilateral basilar atelectasis or scarring. Small right pleural effusion. No segmental infiltrate or pulmonary edema. Stable chronic interstitial prominence. Osteopenia and mild degenerative changes thoracic spine. IMPRESSION: Hyperinflation and chronic interstitial prominence again noted. No segmental infiltrate or pulmonary edema. Small right pleural effusion. Stable bilateral basilar atelectasis or scarring. Electronically Signed   By: Lahoma Crocker M.D.   On: 05/18/2015 15:17     EKG Interpretation  Date/Time:  Sunday May 18 2015 16:55:23 EDT Ventricular Rate:  56 PR Interval:  260 QRS Duration: 128 QT Interval:  530 QTC Calculation: 511 R Axis:   70 Text Interpretation:  Sinus bradycardia with sinus arrhythmia with 1st degree A-V block Non-specific intra-ventricular conduction block Non-specific ST-t changes No significant change since last tracing Confirmed by Ashok Cordia  MD, Lennette Bihari ([id number]) on 05/18/2015 5:07:23 PM          I have personally reviewed and evaluated these images and lab results as part of my medical decision-making.    MDM   Labs.   Wounds cleaned, sterile dressing.  Wounds not requiring sutures, and have  been present since yesterday evening.    Tetanus IM.  Behavioral health team consulted.  Reviewed nursing notes and prior charts for additional history.   Pharmacy consulted re management coumadin dosing.  PT pending.  Recheck pt, no change from prior.  Disposition per psych team.      Lajean Saver, MD 05/18/15 [phone]

## 2015-05-18 NOTE — ED Notes (Signed)
Bed: WA26 Expected date:  Expected time:  Means of arrival:  Comments: 

## 2015-05-18 NOTE — Progress Notes (Signed)
Disposition CSW completed patient referrals to the following inpatient Geri-Psych facilities:  [place].  CSW will continue to follow patient as needed for placement.  [place] Disposition CSW [phone]

## 2015-05-19 ENCOUNTER — Telehealth: Payer: Self-pay | Admitting: Primary Care

## 2015-05-19 ENCOUNTER — Emergency Department (HOSPITAL_COMMUNITY): Payer: Medicare Other

## 2015-05-19 DIAGNOSIS — F331 Major depressive disorder, recurrent, moderate: Secondary | ICD-10-CM | POA: Diagnosis present

## 2015-05-19 DIAGNOSIS — R45851 Suicidal ideations: Secondary | ICD-10-CM

## 2015-05-19 DIAGNOSIS — F0391 Unspecified dementia with behavioral disturbance: Secondary | ICD-10-CM

## 2015-05-19 DIAGNOSIS — F332 Major depressive disorder, recurrent severe without psychotic features: Secondary | ICD-10-CM

## 2015-05-19 DIAGNOSIS — F03918 Unspecified dementia, unspecified severity, with other behavioral disturbance: Secondary | ICD-10-CM | POA: Diagnosis present

## 2015-05-19 LAB — PROTIME-INR
INR: 3.94 — AB (ref 0.00–1.49)
PROTHROMBIN TIME: 37.6 s — AB (ref 11.6–15.2)

## 2015-05-19 LAB — CBC
HEMATOCRIT: 37.3 % — AB (ref 39.0–52.0)
Hemoglobin: 12.4 g/dL — ABNORMAL LOW (ref 13.0–17.0)
MCH: 30.5 pg (ref 26.0–34.0)
MCHC: 33.2 g/dL (ref 30.0–36.0)
MCV: 91.6 fL (ref 78.0–100.0)
PLATELETS: 289 10*3/uL (ref 150–400)
RBC: 4.07 MIL/uL — ABNORMAL LOW (ref 4.22–5.81)
RDW: 15.6 % — AB (ref 11.5–15.5)
WBC: 8.8 10*3/uL (ref 4.0–10.5)

## 2015-05-19 MED ORDER — BUPROPION HCL ER (XL) 150 MG PO TB24
150.0000 mg | ORAL_TABLET | Freq: Every day | ORAL | Status: DC
  Administered 2015-05-19 – 2015-05-20 (×2): 150 mg via ORAL
  Filled 2015-05-19 (×2): qty 1

## 2015-05-19 MED ORDER — BACITRACIN ZINC 500 UNIT/GM EX OINT
TOPICAL_OINTMENT | CUTANEOUS | Status: DC | PRN
  Filled 2015-05-19: qty 1.8

## 2015-05-19 MED ORDER — METOPROLOL TARTRATE 25 MG PO TABS
50.0000 mg | ORAL_TABLET | Freq: Two times a day (BID) | ORAL | Status: DC
  Administered 2015-05-19 – 2015-05-20 (×2): 50 mg via ORAL
  Filled 2015-05-19 (×2): qty 2

## 2015-05-19 MED ORDER — TRAZODONE HCL 50 MG PO TABS
50.0000 mg | ORAL_TABLET | Freq: Every evening | ORAL | Status: DC | PRN
  Administered 2015-05-19: 50 mg via ORAL
  Filled 2015-05-19: qty 1

## 2015-05-19 NOTE — Progress Notes (Signed)
CSW met with patient and family at bedside. CSW educated family regarding referral process.   Family and patient state that they do not have any questions at this time.  Willette Brace [phone] ED CSW 05/19/2015 7:59 PM

## 2015-05-19 NOTE — Telephone Encounter (Signed)
Noted  

## 2015-05-19 NOTE — Consult Note (Signed)
[place] Psychiatry Consult   Reason for Consult:  Suicide attempt Referring Physician:  EDP Patient Identification: Logan Day MRN:  [id number] Principal Diagnosis: Severe recurrent major depression without psychotic features [facility]) Diagnosis:   Patient Active Problem List   Diagnosis Date Noted  . Dementia with behavioral disturbance [F03.91] 05/19/2015    Priority: High  . Severe recurrent major depression without psychotic features ([place]) [F33.2] 05/19/2015    Priority: High  . Incontinence of urine [R32] 04/23/2015  . Medicare annual wellness visit, subsequent [Z00.00] 04/22/2015  . Anxiety and depression [F41.8] 01/22/2015  . On amiodarone therapy [Z79.899] 12/11/2014  . Chronic combined systolic and diastolic congestive heart failure ([place]) [I50.42]   . PAF (paroxysmal atrial fibrillation) ([place]) [I48.0]   . Malnutrition of moderate degree ([place]) [E44.0] 11/30/2014  . CKD (chronic kidney disease) stage 2, GFR 60-89 ml/min [N18.2] 11/29/2014  . Hyperlipidemia [E78.5] 11/29/2014  . Dementia due to another medical condition [F02.80] 11/29/2014  . Weight loss, unintentional [R63.4] 11/29/2014  . Hard of hearing [H91.90] 11/29/2014  . Essential hypertension [I10] 12/18/2013  . Long term current use of anticoagulant therapy [Z79.01] 10/20/2012    Class: Chronic  . Other and unspecified angina pectoris [I20.9] 10/19/2012    Class: Acute  . Atherosclerotic heart disease of native coronary artery with angina pectoris ([place]) [I25.119] 10/19/2012  . Aphasia S/P CVA [I69.920] 07/15/2011    Total Time spent with patient: 45 minutes  Subjective:   Logan Day is a 81 y.o. male patient admitted with suicide attempt.  HPI:  81 yo male who cut his arm to try and end his life, history of depression and dementia.  Positive for suicidal ideations on assessment and wants help.  "I know what I did was wrong but could not help myself."  Denies homicidal ideations, hallucinations,  and alcohol/drug abuse issues.  Agreeable to go to geropsychiatry for assistance with his depression.  Past Psychiatric History: Depression  Risk to Self: Suicidal Ideation: Yes-Currently Present Suicidal Intent: Yes-Currently Present Is patient at risk for suicide?: Yes Suicidal Plan?: Yes-Currently Present Specify Current Suicidal Plan: pt made several lacerations to L arm in suicide attempt today Access to Means: Yes Specify Access to Suicidal Means: access to sharps What has been your use of drugs/alcohol within the last 12 months?: none How many times?: 0 Other Self Harm Risks: none Triggers for Past Attempts:  (n/a) Intentional Self Injurious Behavior: None Risk to Others: Homicidal Ideation: No Thoughts of Harm to Others: No Current Homicidal Intent: No Current Homicidal Plan: No Access to Homicidal Means: No Identified Victim: none History of harm to others?: No Assessment of Violence: None Noted Violent Behavior Description: pt denies hx violence Does patient have access to weapons?: No Criminal Charges Pending?: No Does patient have a court date: No Prior Inpatient Therapy: Prior Inpatient Therapy: No Prior Therapy Dates: na Prior Therapy Facilty/Provider(s): na Reason for Treatment: na Prior Outpatient Therapy: Prior Outpatient Therapy: No Prior Therapy Dates: na Prior Therapy Facilty/Provider(s): na Reason for Treatment: na Does patient have an ACCT team?: No Does patient have Intensive In-House Services?  : No Does patient have [facility] services? : No Does patient have P4CC services?: No  Past Medical History:  Past Medical History  Diagnosis Date  . Hypertension   . Atrial fibrillation ([place])   . Hypercholesteremia   . MI (myocardial infarction) ([place])     x 2  . Angina   . Cancer [facility])     prostate cancer  .  Coronary artery disease     Circ stent (2009 cath patent)  . Prostate cancer ([place])   . Hyperlipidemia   . TIA (transient ischemic attack)   .  GERD (gastroesophageal reflux disease)   . Shortness of breath dyspnea   . COPD (chronic obstructive pulmonary disease) ([place])   . Dementia   . Congestive heart failure [facility])     Past Surgical History  Procedure Laterality Date  . Prostayectomy    . Fracture surgery    . Coronary angioplasty with stent placement  10/19/2012    DES  to circumflex  . Percutaneous coronary stent intervention (pci-s) N/A 10/19/2012    Procedure: PERCUTANEOUS CORONARY STENT INTERVENTION (PCI-S);  Surgeon: Sinclair Grooms, MD;  Location: [facility] CATH LAB;  Service: Cardiovascular;  Laterality: N/A;   Family History:  Family History  Problem Relation Age of Onset  . Coronary artery disease Son   . Heart disease Mother   . Heart disease Father    Family Psychiatric  History: Depression Social History:  History  Alcohol Use No     History  Drug Use No    Social History   Social History  . Marital Status: Widowed    Spouse Name: N/A  . Number of Children: N/A  . Years of Education: N/A   Social History Main Topics  . Smoking status: Former Smoker -- 1.00 packs/day for 40 years  . Smokeless tobacco: Former Systems developer    Types: Chew     Comment: quit smoking 2002  . Alcohol Use: No  . Drug Use: No  . Sexual Activity: No   Other Topics Concern  . None   Social History Narrative   Widower.   Retired. Worked for [employer].   Working outdoors.      Additional Social History:    Pain Medications: pt denies abuse - see PTA meds list Prescriptions: pt denies abuse- see pTA meds list Over the Counter: pt denies abuse - see PTA meds list History of alcohol / drug use?: No history of alcohol / drug abuse                     Allergies:  No Known Allergies  Labs:  Results for orders placed or performed during the hospital encounter of 05/18/15 (from the past 48 hour(s))  CBC with Differential     Status: Abnormal   Collection Time: 05/18/15  2:43 PM  Result Value Ref Range   WBC 9.4 4.0  - 10.5 K/uL   RBC 3.96 (L) 4.22 - 5.81 MIL/uL   Hemoglobin 12.1 (L) 13.0 - 17.0 g/dL   HCT 36.8 (L) 39.0 - 52.0 %   MCV 92.9 78.0 - 100.0 fL   MCH 30.6 26.0 - 34.0 pg   MCHC 32.9 30.0 - 36.0 g/dL   RDW 15.6 (H) 11.5 - 15.5 %   Platelets 290 150 - 400 K/uL   Neutrophils Relative % 82 %   Neutro Abs 7.7 1.7 - 7.7 K/uL   Lymphocytes Relative 8 %   Lymphs Abs 0.8 0.7 - 4.0 K/uL   Monocytes Relative 6 %   Monocytes Absolute 0.6 0.1 - 1.0 K/uL   Eosinophils Relative 4 %   Eosinophils Absolute 0.3 0.0 - 0.7 K/uL   Basophils Relative 0 %   Basophils Absolute 0.0 0.0 - 0.1 K/uL  Comprehensive metabolic panel     Status: Abnormal   Collection Time: 05/18/15  2:43 PM  Result Value  Ref Range   Sodium 138 135 - 145 mmol/L   Potassium 3.9 3.5 - 5.1 mmol/L   Chloride 106 101 - 111 mmol/L   CO2 26 22 - 32 mmol/L   Glucose, Bld 108 (H) 65 - 99 mg/dL   BUN 32 (H) 6 - 20 mg/dL   Creatinine, Ser 1.64 (H) 0.61 - 1.24 mg/dL   Calcium 8.8 (L) 8.9 - 10.3 mg/dL   Total Protein 8.0 6.5 - 8.1 g/dL   Albumin 3.4 (L) 3.5 - 5.0 g/dL   AST 33 15 - 41 U/L   ALT 30 17 - 63 U/L   Alkaline Phosphatase 73 38 - 126 U/L   Total Bilirubin 0.9 0.3 - 1.2 mg/dL   GFR calc non Af Amer 38 (L) >60 mL/min   GFR calc Af Amer 44 (L) >60 mL/min    Comment: (NOTE) The eGFR has been calculated using the CKD EPI equation. This calculation has not been validated in all clinical situations. eGFR's persistently <60 mL/min signify possible Chronic Kidney Disease.    Anion gap 6 5 - 15  Ethanol     Status: None   Collection Time: 05/18/15  2:43 PM  Result Value Ref Range   Alcohol, Ethyl (B) <5 <5 mg/dL    Comment:        LOWEST DETECTABLE LIMIT FOR SERUM ALCOHOL IS 5 mg/dL FOR MEDICAL PURPOSES ONLY   Salicylate level     Status: None   Collection Time: 05/18/15  2:43 PM  Result Value Ref Range   Salicylate Lvl <6.8 2.8 - 30.0 mg/dL  Acetaminophen level     Status: Abnormal   Collection Time: 05/18/15  2:43 PM   Result Value Ref Range   Acetaminophen (Tylenol), Serum <10 (L) 10 - 30 ug/mL    Comment:        THERAPEUTIC CONCENTRATIONS VARY SIGNIFICANTLY. A RANGE OF 10-30 ug/mL MAY BE AN EFFECTIVE CONCENTRATION FOR MANY PATIENTS. HOWEVER, SOME ARE BEST TREATED AT CONCENTRATIONS OUTSIDE THIS RANGE. ACETAMINOPHEN CONCENTRATIONS >150 ug/mL AT 4 HOURS AFTER INGESTION AND >50 ug/mL AT 12 HOURS AFTER INGESTION ARE OFTEN ASSOCIATED WITH TOXIC REACTIONS.   Urine rapid drug screen (hosp performed)     Status: None   Collection Time: 05/18/15  4:00 PM  Result Value Ref Range   Opiates NONE DETECTED NONE DETECTED   Cocaine NONE DETECTED NONE DETECTED   Benzodiazepines NONE DETECTED NONE DETECTED   Amphetamines NONE DETECTED NONE DETECTED   Tetrahydrocannabinol NONE DETECTED NONE DETECTED   Barbiturates NONE DETECTED NONE DETECTED    Comment:        DRUG SCREEN FOR MEDICAL PURPOSES ONLY.  IF CONFIRMATION IS NEEDED FOR ANY PURPOSE, NOTIFY LAB WITHIN 5 DAYS.        LOWEST DETECTABLE LIMITS FOR URINE DRUG SCREEN Drug Class       Cutoff (ng/mL) Amphetamine      1000 Barbiturate      200 Benzodiazepine   032 Tricyclics       122 Opiates          300 Cocaine          300 THC              50   Protime-INR     Status: Abnormal   Collection Time: 05/18/15  4:25 PM  Result Value Ref Range   Prothrombin Time 39.6 (H) 11.6 - 15.2 seconds   INR 4.23 (H) 0.00 - 1.49  Urinalysis, Routine w reflex microscopic (  not at [facility])     Status: Abnormal   Collection Time: 05/18/15  4:41 PM  Result Value Ref Range   Color, Urine YELLOW YELLOW   APPearance CLEAR CLEAR   Specific Gravity, Urine 1.019 1.005 - 1.030   pH 6.0 5.0 - 8.0   Glucose, UA NEGATIVE NEGATIVE mg/dL   Hgb urine dipstick TRACE (A) NEGATIVE   Bilirubin Urine NEGATIVE NEGATIVE   Ketones, ur NEGATIVE NEGATIVE mg/dL   Protein, ur NEGATIVE NEGATIVE mg/dL   Urobilinogen, UA 1.0 0.0 - 1.0 mg/dL   Nitrite NEGATIVE NEGATIVE   Leukocytes, UA  NEGATIVE NEGATIVE  Urine microscopic-add on     Status: None   Collection Time: 05/18/15  4:41 PM  Result Value Ref Range   Squamous Epithelial / LPF RARE RARE   RBC / HPF 0-2 <3 RBC/hpf  Protime-INR     Status: Abnormal   Collection Time: 05/19/15  5:36 AM  Result Value Ref Range   Prothrombin Time 37.6 (H) 11.6 - 15.2 seconds   INR 3.94 (H) 0.00 - 1.49  CBC     Status: Abnormal   Collection Time: 05/19/15  5:36 AM  Result Value Ref Range   WBC 8.8 4.0 - 10.5 K/uL   RBC 4.07 (L) 4.22 - 5.81 MIL/uL   Hemoglobin 12.4 (L) 13.0 - 17.0 g/dL   HCT 37.3 (L) 39.0 - 52.0 %   MCV 91.6 78.0 - 100.0 fL   MCH 30.5 26.0 - 34.0 pg   MCHC 33.2 30.0 - 36.0 g/dL   RDW 15.6 (H) 11.5 - 15.5 %   Platelets 289 150 - 400 K/uL    Current Facility-Administered Medications  Medication Dose Route Frequency Provider Last Rate Last Dose  . albuterol (PROVENTIL HFA;VENTOLIN HFA) 108 (90 BASE) MCG/ACT inhaler 1-2 puff  1-2 puff Inhalation Q6H PRN Lajean Saver, MD      . amiodarone (PACERONE) tablet 200 mg  200 mg Oral Daily Lajean Saver, MD   200 mg at 05/19/15 0942  . aspirin EC tablet 81 mg  81 mg Oral Daily Lajean Saver, MD   81 mg at 05/19/15 0942  . bacitracin ointment   Topical PRN Lajean Saver, MD      . buPROPion (WELLBUTRIN XL) 24 hr tablet 150 mg  150 mg Oral Daily Estanislao Harmon   150 mg at 05/19/15 1041  . metoprolol tartrate (LOPRESSOR) tablet 50 mg  50 mg Oral BID Lajean Saver, MD   50 mg at 05/19/15 0944  . pantoprazole (PROTONIX) EC tablet 40 mg  40 mg Oral Daily Lajean Saver, MD   40 mg at 05/19/15 0945  . sertraline (ZOLOFT) tablet 100 mg  100 mg Oral QPM Lajean Saver, MD   100 mg at 05/18/15 1738  . traZODone (DESYREL) tablet 50 mg  50 mg Oral QHS PRN Arlan Birks      . Warfarin - Pharmacist Dosing Inpatient   Does not apply q1800 Randa Spike, RPH   0 each at 05/19/15 1800   Current Outpatient Prescriptions  Medication Sig Dispense Refill  . albuterol (PROAIR HFA) 108 (90  BASE) MCG/ACT inhaler Inhale 1-2 puffs into the lungs every 6 (six) hours as needed for wheezing or shortness of breath. 1 Inhaler 3  . ALPRAZolam (XANAX) 0.5 MG tablet Take 1 tablet (0.5 mg total) by mouth at bedtime. (Patient taking differently: Take 0.5 mg by mouth at bedtime as needed for sleep. ) 5 tablet 0  . amiodarone (PACERONE) 200 MG tablet  Take 1 tablet (200 mg total) by mouth daily. 30 tablet 5  . aspirin EC 81 MG tablet Take 1 tablet (81 mg total) by mouth daily.    [name] buPROPion (WELLBUTRIN SR) 150 MG 12 hr tablet Take 150 mg by mouth 2 (two) times daily.     . feeding supplement, ENSURE ENLIVE, (ENSURE ENLIVE) LIQD Take 237 mLs by mouth 2 (two) times daily after a meal. (Patient taking differently: Take 237 mLs by mouth daily. )    . metoprolol (LOPRESSOR) 50 MG tablet Take 50 mg by mouth 2 (two) times daily.    . Multiple Vitamin (MULTIVITAMIN WITH MINERALS) TABS tablet Take 1 tablet by mouth every evening.    . nitroGLYCERIN (NITROSTAT) 0.4 MG SL tablet Place 0.4 mg under the tongue every 5 (five) minutes as needed for chest pain.    [name] omeprazole (PRILOSEC) 20 MG capsule Take 20 mg by mouth every morning.    . pravastatin (PRAVACHOL) 40 MG tablet Take 40 mg by mouth every evening.    . sertraline (ZOLOFT) 100 MG tablet Take 100 mg by mouth every evening.    . traMADol (ULTRAM) 50 MG tablet Take 50 mg by mouth 2 (two) times daily as needed for pain.    [name] warfarin (COUMADIN) 4 MG tablet Take 2-4 mg by mouth every evening. Take half of a tablet ($RemoveB'2mg'LAIoMrEi$ ) on Tuesdays & Fridays. On all other days take a whole tablet ($RemoveBefor'4mg'iEugvhJvpASL$ ).      Musculoskeletal: Strength & Muscle Tone: within normal limits Gait & Station: normal Patient leans: N/A  Psychiatric Specialty Exam: Review of Systems  Constitutional: Negative.   HENT: Negative.   Eyes: Negative.   Respiratory: Negative.   Cardiovascular: Negative.   Gastrointestinal: Negative.   Genitourinary: Negative.   Musculoskeletal: Negative.    Skin: Negative.   Neurological: Negative.   Endo/Heme/Allergies: Negative.   Psychiatric/Behavioral: Positive for depression, suicidal ideas and memory loss.    Blood pressure 134/79, pulse 65, temperature 98.1 F (36.7 C), temperature source Oral, resp. rate 18, height $RemoveBe'5\' 11"'nbSPySZiv$  (1.803 m), weight 66.225 kg (146 lb), SpO2 100 %.Body mass index is 20.37 kg/(m^2).  General Appearance: Casual  Eye Contact::  Good  Speech:  Normal Rate  Volume:  Normal  Mood:  Depressed  Affect:  Congruent  Thought Process:  Coherent  Orientation:  Full (Time, Place, and Person)  Thought Content:  Rumination  Suicidal Thoughts:  Yes.  with intent/plan  Homicidal Thoughts:  No  Memory:  Immediate;   Fair Recent;   Fair Remote;   Fair  Judgement:  Poor  Insight:  Fair  Psychomotor Activity:  Decreased  Concentration:  Fair  Recall:  [employer] of Knowledge:Fair  Language: Good  Akathisia:  No  Handed:  Right  AIMS (if indicated):     Assets:  Leisure Time Resilience Social Support  ADL's:  Impaired  Cognition: Impaired,  Mild  Sleep:      Treatment Plan Summary: Daily contact with patient to assess and evaluate symptoms and progress in treatment, Medication management and Plan major depression, recurrent, severe  -Crisis stabilization -Medication management:  Zoloft 100 mg daily for depression restarted.  Wellbutrin 150 mg BID for depression started at 150 mg daily.  Trazodone 50 mg at bedtime for sleep issues. -Individual counseling  Disposition: Recommend psychiatric Inpatient admission when medically cleared.  Waylan Boga, [place] 05/19/2015 12:35 PM Patient seen face-to-face for psychiatric evaluation, chart reviewed and case discussed with the physician extender and developed  treatment plan. Reviewed the information documented and agree with the treatment plan. Corena Pilgrim, MD

## 2015-05-19 NOTE — Telephone Encounter (Signed)
Pts daughter in law came to pick up nursing home form that was completed by Anda Kraft. She wanted to let Anda Kraft know that he was at [facility] currently after being admitted this weekend for suicidal thoughts. After being discharged pt will go to [facility] or [facility] for long term care, until they can find something closer to home.

## 2015-05-19 NOTE — BH Assessment (Signed)
[place] Assessment Progress Note  The following facilities have been contacted to seek placement for this pt, with results as noted:  Beds available, information sent, decision pending:  [facility]   Additionally numerous attempts were made to send referral information to [facility] without success.   Jalene Mullet, [place] Triage Specialist [phone]

## 2015-05-19 NOTE — Progress Notes (Signed)
ANTICOAGULATION CONSULT NOTE - Initial Consult  Pharmacy Consult for Warfarin Indication: atrial fibrillation  No Known Allergies  Patient Measurements: Height: 5\' 11"  (180.3 cm) Weight: 146 lb (66.225 kg) IBW/kg (Calculated) : 75.3  Vital Signs: Temp: 98.1 F (36.7 C) (10/17 0608) Temp Source: Oral (10/17 3343) BP: 134/79 mmHg (10/17 0608) Pulse Rate: 65 (10/17 0608)  Labs:  Recent Labs  05/18/15 1443 05/18/15 1625 05/19/15 0536  HGB 12.1*  --  12.4*  HCT 36.8*  --  37.3*  PLT 290  --  289  LABPROT  --  39.6* 37.6*  INR  --  4.23* 3.94*  CREATININE 1.64*  --   --     Estimated Creatinine Clearance: 34.2 mL/min (by C-G formula based on Cr of 1.64).   Medical History: Past Medical History  Diagnosis Date  . Hypertension   . Atrial fibrillation ([place])   . Hypercholesteremia   . MI (myocardial infarction) ([place])     x 2  . Angina   . Cancer [facility])     prostate cancer  . Coronary artery disease     Circ stent (2009 cath patent)  . Prostate cancer ([place])   . Hyperlipidemia   . TIA (transient ischemic attack)   . GERD (gastroesophageal reflux disease)   . Shortness of breath dyspnea   . COPD (chronic obstructive pulmonary disease) ([place])   . Dementia   . Congestive heart failure ([facility])     Medications:  Scheduled:  . amiodarone  200 mg Oral Daily  . aspirin EC  81 mg Oral Daily  . buPROPion  150 mg Oral BID  . metoprolol succinate  25 mg Oral BID  . pantoprazole  40 mg Oral Daily  . sertraline  100 mg Oral QPM  . Warfarin - Pharmacist Dosing Inpatient   Does not apply q1800   Infusions:    Assessment: 67 yoM admitted 10/16 after suicide attempt w/ lacerations to his forearm. He takes warfarin for paroxysmal a.fib. Pharmacy is asked to manage while inpatient. Home regimen reported as 2mg  daily except 4mg  on Tues & Thurs. Last taken on 10/15.  05/18/15: INR 4.23, supratherapeutic. CBC:  Hgb 12.1 (near baseline with previous values ~ 12) and Plt  WNL. Drug-drug interactions: home medications include amiodarone and aspirin 81 mg  05/19/15: INR 3.94, supratherapeutic CBC: Hgb 12.4, plt 289 No new drug-drug interacting meds started.  Goal of Therapy:  INR 2-3 Monitor platelets by anticoagulation protocol: Yes   Plan:  Hold warfarin dose today. Daily INR and CBC.  Royetta Asal PharmD, BCPS Pager [phone] 05/19/2015 8:22 AM

## 2015-05-19 NOTE — BH Assessment (Signed)
[place] Assessment Progress Note  Per Corena Pilgrim, MD, this pt requires psychiatric hospitalization at this time.  Pt has been tentatively accepted to [facility] in anticipation of a room becoming available later today.  They will call when they are ready for the pt.  Accepting physician in Dr Jeanette Caprice.  Room will be 9441.  Please call report to [phone] or [phone].  Completion of transfer has been passed on to [place], [place].  Jalene Mullet, [place] Triage Specialist [phone]

## 2015-05-19 NOTE — ED Notes (Addendum)
Family at bedside: son, [name] and daughter in law-  Brought in [facility] forms and official yellow DNR form. SW, Brittney aware, went in room to speak to family.  Pt son and wife states there is an improvement the can see and that patient is better today. He is more talkative. States prior to arrival, patient was not himself, swearing and having a tantrum. He was very upset.   Glory Buff (son)- [phone]. States if you call other son, Kasandra Knudsen, he will not answer his phone.

## 2015-05-19 NOTE — ED Notes (Signed)
Patient is resting comfortably, sitter at bedside  

## 2015-05-19 NOTE — ED Notes (Signed)
During wound care and dressing change  To left forearm, patient began to state why he injured himself. He states, "I just had too much to handle. I couldn't handle it all. My wife died and I got cancer down here ( as he points to his groin area). I just thought if I hit myself here with the  Knife, It would go away. The blood would just pour out. "  He states the Lord put this here in me, he is the reason why I did my arm like this, I was trying to get it out."   Pt had to be reminded about words such as cancer and knife. Pt is alert to self and situation.

## 2015-05-19 NOTE — ED Notes (Signed)
Patient ate 75% of lunch

## 2015-05-20 LAB — CBC
HEMATOCRIT: 35.8 % — AB (ref 39.0–52.0)
Hemoglobin: 11.5 g/dL — ABNORMAL LOW (ref 13.0–17.0)
MCH: 29.7 pg (ref 26.0–34.0)
MCHC: 32.1 g/dL (ref 30.0–36.0)
MCV: 92.5 fL (ref 78.0–100.0)
Platelets: 249 10*3/uL (ref 150–400)
RBC: 3.87 MIL/uL — AB (ref 4.22–5.81)
RDW: 16 % — ABNORMAL HIGH (ref 11.5–15.5)
WBC: 6.7 10*3/uL (ref 4.0–10.5)

## 2015-05-20 LAB — PROTIME-INR
INR: 3.36 — ABNORMAL HIGH (ref 0.00–1.49)
Prothrombin Time: 33.3 s — ABNORMAL HIGH (ref 11.6–15.2)

## 2015-05-20 NOTE — BH Assessment (Signed)
[facility] Assessment Progress Note  This morning this writer spoke to pt about plan to transfer him to [facility].  Pt agreed to this plan at the time, but later told his nurse that he did not want to be transferred.  I then discussed this with Logan Pilgrim, MD.  He continues to recommend psychiatric hospitalization for the pt, but requests that I discuss plan with pt's son, [facility], who holds [facility] for the pt.  I then called [facility] at [facility].  She reports that they are willing to have the son sign consent for admission on pt's behalf as long as he holds [facility].  However, a bed is not currently available for the pt.  They anticipate that a bed will open later today and will call us when they are ready for the pt.  Health Care POA documents were then faxed to [facility].  I then called Logan Day at [phone].  He is in agreement with Dr. Marquis Day judgment and will come to [facility] to discuss the situation with the pt, along with this Probation officer.  Pt's nurse has been notified to inform me when the son arrives.  Logan Day, [place] Triage Specialist [phone]

## 2015-05-20 NOTE — ED Notes (Signed)
Patient is resting comfortably. 

## 2015-05-20 NOTE — BH Assessment (Signed)
[place] Assessment Progress Note  Pt's son and [place] has arrived at [facility].  At 14:10 Darlene calls from [facility].  A bed is now available for the pt with an understanding that [name] will arrive with the pt to sign consent for admission.  Accepting physician is Dr Jeanette Caprice.  Pt's room is 9442.  Please call report to [phone].  Charmaine Downs, NP concurs with this plan.  Pt's nurse has been notified.  Pt is to be transported via Stacey Drain, [place] Triage Specialist [phone]

## 2015-05-20 NOTE — Progress Notes (Signed)
ANTICOAGULATION CONSULT NOTE - Initial Consult  Pharmacy Consult for Warfarin Indication: atrial fibrillation  No Known Allergies  Patient Measurements: Height: 5\' 11"  (180.3 cm) Weight: 146 lb (66.225 kg) IBW/kg (Calculated) : 75.3  Vital Signs: Temp: 98.1 F (36.7 C) (10/18 1236) Temp Source: Oral (10/18 1236) BP: 131/60 mmHg (10/18 1236) Pulse Rate: 55 (10/18 1236)  Labs:  Recent Labs  05/18/15 1443 05/18/15 1625 05/19/15 0536 05/20/15 0610  HGB 12.1*  --  12.4* 11.5*  HCT 36.8*  --  37.3* 35.8*  PLT 290  --  289 249  LABPROT  --  39.6* 37.6* 33.3*  INR  --  4.23* 3.94* 3.36*  CREATININE 1.64*  --   --   --     Estimated Creatinine Clearance: 34.2 mL/min (by C-G formula based on Cr of 1.64).   Medical History: Past Medical History  Diagnosis Date  . Hypertension   . Atrial fibrillation ([place])   . Hypercholesteremia   . MI (myocardial infarction) ([place])     x 2  . Angina   . Cancer [facility])     prostate cancer  . Coronary artery disease     Circ stent (2009 cath patent)  . Prostate cancer ([place])   . Hyperlipidemia   . TIA (transient ischemic attack)   . GERD (gastroesophageal reflux disease)   . Shortness of breath dyspnea   . COPD (chronic obstructive pulmonary disease) ([place])   . Dementia   . Congestive heart failure (H[facility])     Medications:  Scheduled:  . amiodarone  200 mg Oral Daily  . aspirin EC  81 mg Oral Daily  . buPROPion  150 mg Oral Daily  . metoprolol  50 mg Oral BID  . pantoprazole  40 mg Oral Daily  . sertraline  100 mg Oral QPM  . Warfarin - Pharmacist Dosing Inpatient   Does not apply q1800   Infusions:    Assessment: 84 yoM admitted 10/16 after suicide attempt w/ lacerations to his forearm. He takes warfarin for paroxysmal a.fib. Pharmacy is asked to manage while inpatient. Home regimen reported as 2mg  daily except 4mg  on Tues & Thurs. Last taken on 10/15.  05/18/15: INR 4.23, supratherapeutic. CBC:  Hgb 12.1 (near baseline with  previous values ~ 12) and Plt WNL. Drug-drug interactions: home medications include amiodarone and aspirin 81 mg  05/19/15: INR 3.94, supratherapeutic CBC: Hgb 12.4, plt 289 No new drug-drug interacting meds started.  05/20/15: INR 3.36, supratherapeutic Hgb 11.5, plt 249  Goal of Therapy:  INR 2-3 Monitor platelets by anticoagulation protocol: Yes   Plan:  Hold warfarin dose today. Daily INR and CBC.  Royetta Asal PharmD, BCPS Pager [phone] 05/20/2015 2:27 PM

## 2015-05-20 NOTE — ED Notes (Addendum)
Pt has no belongings with him for transport.  Notified son regarding transport.  [place] notified for transport.

## 2015-05-22 ENCOUNTER — Ambulatory Visit: Payer: Medicare Other

## 2015-05-22 ENCOUNTER — Ambulatory Visit: Payer: Medicare Other | Admitting: Primary Care

## 2015-05-29 ENCOUNTER — Other Ambulatory Visit: Payer: Self-pay | Admitting: Primary Care

## 2015-05-29 ENCOUNTER — Ambulatory Visit: Payer: Medicare Other

## 2015-05-29 ENCOUNTER — Telehealth: Payer: Self-pay | Admitting: *Deleted

## 2015-05-29 DIAGNOSIS — F411 Generalized anxiety disorder: Secondary | ICD-10-CM

## 2015-05-29 MED ORDER — ALPRAZOLAM 0.5 MG PO TABS: 0.5000 mg | ORAL_TABLET | Freq: Two times a day (BID) | ORAL | Status: DC | PRN

## 2015-05-29 NOTE — Telephone Encounter (Signed)
Ian,[facility],called and asked if he can get a response to this message today.  They'll be faxing over an fl-2 verification to check his medications.  Please call back at [phone].

## 2015-05-29 NOTE — Telephone Encounter (Addendum)
Logan Day with [facility] Day called Triage. Pt was admitted to [facility] yesterday and there is a problem with pt's med list. They received 2 different FL2 forms for pt and the 1st one that [name] sent has all of his meds that are listed on his medication list but the 2nd FL2 Form didn't have 1/2 of his meds on there. Logan Day is request that [name], NP fax her over a list of what medications pt should be on to fax # [phone]  (Att: Logan Day)  Logan Day said there is 2 different doses of zoloft 100mg  qd, zoloft 125mg  daily on the form and she isn't sure what dose pt should be on or should pt be on both doses.  Also the 2nd fl2 didn't have most of his meds from 1st fl2 form, the meds that were not on the 2nd FL2 forms are: Albuterol inhaler q6hr prn Xanax 0.5mg  hs prn for sleep Wellbutrin SR 150mg  BID Ensure BID Nitroglycerin 0.4 prn Tramadol 50 mg Vistaril  Also the hospital sent over a paper Rx for xerelto 15mg  BID, and then stating on 06/18/15 20mg  qd, they gave him this Rx instead coumadin because pt would have to come to our office to get his coumadin checked and they thought this was easier so she wants to make sure you are okay with this change. Also they are not comfortable giving pt an injection like vistaril if he has anxiety during the day so they would like an anxiety med that they can give him during the day, his xanax only has him taking it at night so if you want him to be able to take it during the day they would need you to make that change on his med list.  Logan Day would also like a verbal Home Health Order to Eval pt for PT, OT, and Speech

## 2015-05-29 NOTE — Telephone Encounter (Signed)
Noted. Spoke with Natale Milch from [facility] and discussed med req in full. She is to speak with cardiology regarding xarelto initiation to replace coumadin.

## 2015-05-29 NOTE — Telephone Encounter (Signed)
Error

## 2015-06-02 ENCOUNTER — Telehealth: Payer: Self-pay | Admitting: Interventional Cardiology

## 2015-06-02 NOTE — Telephone Encounter (Signed)
New problem    Per son calling pt is now at [facility] and they are wanting to change him from coumadin to Xarelto. Please call pt's son.

## 2015-06-02 NOTE — Telephone Encounter (Signed)
Returned pt son call. Pt on asked that I call [facility] and talk with Tera Helper @ [phone] about switching pt anti-coag med from Coumadin to Xarelto. lmtcb on Lacie's voicemail  Discussed and reviewed with Porfirio Oar, Pharm -D. Ok for pt to switch to Xarelto 15mg  qd. Pt needs to have his INR checked the same day prior to switch. Pt's INR needs to be <3.0 before switch.

## 2015-06-03 NOTE — Telephone Encounter (Signed)
F/u   Lacie stated to call her between 4pm - 4:30 because she is going into a meeting. Or Danelle Earthly at 3:30.

## 2015-06-03 NOTE — Telephone Encounter (Signed)
Returned call lmtcb. 

## 2015-06-03 NOTE — Telephone Encounter (Signed)
F/u   Lacie returning your call, she will be leaving the office at 4:30

## 2015-06-04 ENCOUNTER — Ambulatory Visit (INDEPENDENT_AMBULATORY_CARE_PROVIDER_SITE_OTHER): Payer: Medicare Other | Admitting: *Deleted

## 2015-06-04 DIAGNOSIS — I639 Cerebral infarction, unspecified: Secondary | ICD-10-CM

## 2015-06-04 DIAGNOSIS — Z7901 Long term (current) use of anticoagulants: Secondary | ICD-10-CM

## 2015-06-04 LAB — POCT INR: INR: 2.1

## 2015-06-04 NOTE — Telephone Encounter (Signed)
contd Discussed and reviewed with Porfirio Oar, Pharm -D. Ok for pt to switch to Xarelto 15mg  qd. Pt needs to have his INR checked the same day prior to switch. Pt's INR needs to be <3.0 before switch.. Pt coumadin has been manage by pt pcp. Lacie sts that she will f/u with pt pcp today, to get a clarification on Xarelto dosage, and get pt in to have an INR check there. She will call back if further assistance is needed

## 2015-06-04 NOTE — Telephone Encounter (Signed)
Spoke with Laci @ [facility]. She sts that pt has been started on Xarelto 15mg  bid at the facility he was previously at prior to them. Adv her per Elberta Leatherwood, Pharm- D instructions on switching from Coumadin to Xarelto and the dosage

## 2015-06-04 NOTE — Progress Notes (Signed)
Pre visit review using our clinic review tool, if applicable. No additional management support is needed unless otherwise documented below in the visit note. 

## 2015-06-06 ENCOUNTER — Telehealth: Payer: Self-pay | Admitting: Primary Care

## 2015-06-06 NOTE — Telephone Encounter (Signed)
Lacie called to make sure Logan Day received the fax she sent yesterday afternoon.  Patient's urine is brown, he has a bruise on his right side and he's having body aches and neck pain.  They would like to get something for discomfort.  Patient said he can't take Tylenol.

## 2015-06-06 NOTE — Telephone Encounter (Signed)
Noted. Spoke with Carole Civil at [facility]. Stat urinalysis, cbc, bmp ordered. INR was 2.3 earlier this week.

## 2015-06-09 ENCOUNTER — Telehealth: Payer: Self-pay

## 2015-06-09 NOTE — Telephone Encounter (Signed)
PLEASE NOTE: All timestamps contained within this report are represented as [place] Standard Time. CONFIDENTIALTY NOTICE: This fax transmission is intended only for the addressee. It contains information that is legally privileged, confidential or otherwise protected from use or disclosure. If you are not the intended recipient, you are strictly prohibited from reviewing, disclosing, copying using or disseminating any of this information or taking any action in reliance on or regarding this information. If you have received this fax in error, please notify us immediately by telephone so that we can arrange for its return to [place]. Phone: [phone], Toll-Free: [phone], Fax: [phone] Page: 1 of 1 Call Id: [id number] [place] Patient Name: Logan Day Gender: Male DOB: [birth year] Age: 81 Y 11 M 14 D Return Phone Number: Address: City/State/Zip: [place] Client [place] Night - Client Client Site [place] [facility] [place], [place] Type Call Caller Name [place] Phone Number unk Relationship To Patient Provider Is this call to report lab results? No Call Type Page Only Initial Comment Charlean Sanfilippo from [place] at [facility], [phone] says they have STAT urine samples that need to be picked up. [facility] does not come and pick up on the weekend and needs to know which company the office uses General Information Type Other Nurse Assessment Guidelines Guideline Title Affirmed Question Affirmed Notes Nurse Date/Time (Eastern Time) Disp. Time Eilene Ghazi Time) Disposition Final User 06/07/2015 4:45:32 PM Send to [place], Rosie 06/07/2015 5:08:47 PM General Information Provided Yes Sherrilyn Rist After Care Instructions Given Call Event Type User Date / Time  Description Comments User: Sherrilyn Rist Date/Time Eilene Ghazi Time): 06/07/2015 5:07:43 PM contacted [place] at [facility] and instructed them to contact the office during normal business hours

## 2015-06-09 NOTE — Telephone Encounter (Signed)
Will send note to [name], NP and Aniceto Boss and Karna Christmas in [facility] lab.

## 2015-06-09 NOTE — Telephone Encounter (Signed)
Noted. Patient scheduled for an office visit tomorrow 06/10/15, will evaluate.

## 2015-06-10 ENCOUNTER — Ambulatory Visit (INDEPENDENT_AMBULATORY_CARE_PROVIDER_SITE_OTHER): Payer: Medicare Other | Admitting: Primary Care

## 2015-06-10 ENCOUNTER — Encounter: Payer: Self-pay | Admitting: Primary Care

## 2015-06-10 ENCOUNTER — Other Ambulatory Visit: Payer: Self-pay | Admitting: Primary Care

## 2015-06-10 VITALS — BP 122/60 | HR 64 | Temp 97.9°F | Ht 71.0 in | Wt 158.0 lb

## 2015-06-10 DIAGNOSIS — N390 Urinary tract infection, site not specified: Secondary | ICD-10-CM | POA: Diagnosis not present

## 2015-06-10 DIAGNOSIS — R310 Gross hematuria: Secondary | ICD-10-CM | POA: Diagnosis not present

## 2015-06-10 DIAGNOSIS — R319 Hematuria, unspecified: Secondary | ICD-10-CM | POA: Diagnosis not present

## 2015-06-10 LAB — POCT URINALYSIS DIPSTICK
Nitrite, UA: POSITIVE
SPEC GRAV UA: 1.025
UROBILINOGEN UA: 2
pH, UA: 5

## 2015-06-10 LAB — CBC WITH DIFFERENTIAL/PLATELET
BASOS PCT: 0.5 % (ref 0.0–3.0)
Basophils Absolute: 0 10*3/uL (ref 0.0–0.1)
EOS ABS: 0.6 10*3/uL (ref 0.0–0.7)
Eosinophils Relative: 7.5 % — ABNORMAL HIGH (ref 0.0–5.0)
HCT: 29.2 % — ABNORMAL LOW (ref 39.0–52.0)
HEMOGLOBIN: 9.4 g/dL — AB (ref 13.0–17.0)
Lymphocytes Relative: 11.5 % — ABNORMAL LOW (ref 12.0–46.0)
Lymphs Abs: 0.9 10*3/uL (ref 0.7–4.0)
MCHC: 32.2 g/dL (ref 30.0–36.0)
MCV: 92.5 fl (ref 78.0–100.0)
MONO ABS: 0.8 10*3/uL (ref 0.1–1.0)
Monocytes Relative: 9.3 % (ref 3.0–12.0)
NEUTROS ABS: 5.8 10*3/uL (ref 1.4–7.7)
Neutrophils Relative %: 71.2 % (ref 43.0–77.0)
PLATELETS: 298 10*3/uL (ref 150.0–400.0)
RBC: 3.16 Mil/uL — ABNORMAL LOW (ref 4.22–5.81)
RDW: 17.1 % — AB (ref 11.5–15.5)
WBC: 8.2 10*3/uL (ref 4.0–10.5)

## 2015-06-10 LAB — BASIC METABOLIC PANEL
BUN: 28 mg/dL — ABNORMAL HIGH (ref 6–23)
CALCIUM: 8.7 mg/dL (ref 8.4–10.5)
CO2: 27 meq/L (ref 19–32)
Chloride: 104 mEq/L (ref 96–112)
Creatinine, Ser: 1.53 mg/dL — ABNORMAL HIGH (ref 0.40–1.50)
GFR: 46.78 mL/min — AB (ref >60.00)
Glucose, Bld: 83 mg/dL (ref 70–99)
POTASSIUM: 4.3 meq/L (ref 3.5–5.1)
SODIUM: 138 meq/L (ref 135–145)

## 2015-06-10 MED ORDER — SULFAMETHOXAZOLE-TRIMETHOPRIM 800-160 MG PO TABS
1.0000 | ORAL_TABLET | Freq: Two times a day (BID) | ORAL | Status: DC
Start: 2015-06-10 — End: 2015-08-28

## 2015-06-10 NOTE — Patient Instructions (Signed)
Start Bactrim DS tablets for urinary tract infection. Take 1 tablet by mouth twice daily for 7 days.  Complete lab work prior to leaving today. I will notify you of your results.  Please notify me if his blood does not improve in 5 days.  Please help to clear his living space for potential falls.  It was a pleasure to see you today!

## 2015-06-10 NOTE — Progress Notes (Signed)
Subjective:    Patient ID: Logan Day, male    DOB: [birth year], 81 y.o.   MRN: [id number]  HPI  Mr. Mcpheeters is a 81 year old male who presents today with a chief complaint of gross, painless, hematuria. He resides at [facility] in the memory care unit. He first noticed his urine discoloration one week ago. He has generalized body aches, incontinence. He denies abdominal pain, falls, fevers. He's a poor historian overall. He has a history of atrial fibrillation and is managed on Xarelto 15 mg.   Review of Systems  Constitutional: Negative for fever and fatigue.  Gastrointestinal: Negative for abdominal pain.  Genitourinary: Positive for hematuria. Negative for flank pain and difficulty urinating.       Incontinence  Musculoskeletal: Positive for myalgias.       Past Medical History  Diagnosis Date  . Hypertension   . Atrial fibrillation ([place])   . Hypercholesteremia   . MI (myocardial infarction) ([place])     x 2  . Angina   . Cancer [facility])     prostate cancer  . Coronary artery disease     Circ stent (2009 cath patent)  . Prostate cancer ([place])   . Hyperlipidemia   . TIA (transient ischemic attack)   . GERD (gastroesophageal reflux disease)   . Shortness of breath dyspnea   . COPD (chronic obstructive pulmonary disease) ([place])   . Dementia   . Congestive heart failure [facility])     Social History   Social History  . Marital Status: Widowed    Spouse Name: N/A  . Number of Children: N/A  . Years of Education: N/A   Occupational History  . Not on file.   Social History Main Topics  . Smoking status: Former Smoker -- 1.00 packs/day for 40 years  . Smokeless tobacco: Former Systems developer    Types: Chew     Comment: quit smoking 2002  . Alcohol Use: No  . Drug Use: No  . Sexual Activity: No   Other Topics Concern  . Not on file   Social History Narrative   Widower.   Retired. Worked for [employer].   Working outdoors.       Past  Surgical History  Procedure Laterality Date  . Prostayectomy    . Fracture surgery    . Coronary angioplasty with stent placement  10/19/2012    DES  to circumflex  . Percutaneous coronary stent intervention (pci-s) N/A 10/19/2012    Procedure: PERCUTANEOUS CORONARY STENT INTERVENTION (PCI-S);  Surgeon: Sinclair Grooms, MD;  Location: [facility] CATH LAB;  Service: Cardiovascular;  Laterality: N/A;    Family History  Problem Relation Age of Onset  . Coronary artery disease Son   . Heart disease Mother   . Heart disease Father     No Known Allergies  Current Outpatient Prescriptions on File Prior to Visit  Medication Sig Dispense Refill  . albuterol (PROAIR HFA) 108 (90 BASE) MCG/ACT inhaler Inhale 1-2 puffs into the lungs every 6 (six) hours as needed for wheezing or shortness of breath. 1 Inhaler 3  . ALPRAZolam (XANAX) 0.5 MG tablet Take 1 tablet (0.5 mg total) by mouth 2 (two) times daily as needed for anxiety. 60 tablet 2  . amiodarone (PACERONE) 200 MG tablet Take 1 tablet (200 mg total) by mouth daily. 30 tablet 5  . aspirin EC 81 MG tablet Take 1 tablet (81 mg total) by mouth daily.    [name]  bisacodyl (DULCOLAX) 5 MG EC tablet Take 5 mg by mouth daily as needed for moderate constipation.    [name] buPROPion (WELLBUTRIN SR) 150 MG 12 hr tablet Take 150 mg by mouth 2 (two) times daily.     . feeding supplement, ENSURE ENLIVE, (ENSURE ENLIVE) LIQD Take 237 mLs by mouth 2 (two) times daily after a meal. (Patient taking differently: Take 237 mLs by mouth 2 (two) times daily between meals. )    . metoprolol (LOPRESSOR) 50 MG tablet Take 50 mg by mouth 2 (two) times daily.    . Multiple Vitamin (MULTIVITAMIN WITH MINERALS) TABS tablet Take 1 tablet by mouth every evening.    . nitroGLYCERIN (NITROSTAT) 0.4 MG SL tablet Place 0.4 mg under the tongue every 5 (five) minutes as needed for chest pain.    [name] omeprazole (PRILOSEC) 20 MG capsule Take 20 mg by mouth every morning.    . pravastatin (PRAVACHOL)  40 MG tablet Take 40 mg by mouth every evening.    . Rivaroxaban (XARELTO) 15 MG TABS tablet Take 15 mg by mouth 2 (two) times daily with a meal. x20 days    . sertraline (ZOLOFT) 100 MG tablet Take 100 mg by mouth every evening.    . traZODone (DESYREL) 50 MG tablet Take 50 mg by mouth at bedtime.     No current facility-administered medications on file prior to visit.    BP 122/60 mmHg  Pulse 64  Temp(Src) 97.9 F (36.6 C) (Oral)  Ht 5\' 11"  (1.803 m)  Wt 158 lb (71.668 kg)  BMI 22.05 kg/m2  SpO2 98%    Objective:   Physical Exam  Cardiovascular: Normal rate and regular rhythm.   Pulmonary/Chest: Effort normal and breath sounds normal.  Abdominal: Soft. Normal appearance and bowel sounds are normal. There is no tenderness. There is no CVA tenderness.  Genitourinary:  Moderate amount of blood in urine container.  Skin: Skin is warm and dry.  4 moderate sized bruises in various healing stages. No skin breakdown or skin tears.          Assessment & Plan:  Gross hematuria:  Urine discoloration since Thursday last week, overall worse. He's had some incontinence which isn't new. Denies urinary symptoms, fevers, abdominal pain. Poor historian overall. UA: 3+ leuks, 3+ blood, positive nitirtes Treat with Bactrim DS BID. RX written and sent with patient and his son to the assisted living center. Culture sent. If no improvement in bleeding, then will consider hematuria work up including CT scan. History of prostate cancer. CBC and BMP pending today.

## 2015-06-12 ENCOUNTER — Telehealth: Payer: Self-pay | Admitting: Primary Care

## 2015-06-12 ENCOUNTER — Other Ambulatory Visit: Payer: Self-pay | Admitting: Primary Care

## 2015-06-12 DIAGNOSIS — R310 Gross hematuria: Secondary | ICD-10-CM

## 2015-06-12 LAB — URINE CULTURE
COLONY COUNT: NO GROWTH
Organism ID, Bacteria: NO GROWTH

## 2015-06-12 NOTE — Telephone Encounter (Signed)
Logan Day called from the [facility] - they received the order to hold the xelerto.  They want to know how long to hold the medication. Please call back - [phone] Ask for Logan Day or Loa Socks Thank you

## 2015-06-12 NOTE — Telephone Encounter (Signed)
Message left for patient to return my call.  

## 2015-06-12 NOTE — Telephone Encounter (Signed)
Since Anda Kraft is out of the office. I had to asked Webb Silversmith of how long to hold xarelto. She stated until patient see urology.

## 2015-06-12 NOTE — Telephone Encounter (Signed)
Loa Socks request written order how long to hold xarelto on prescription note faxed to [phone].

## 2015-06-13 NOTE — Telephone Encounter (Signed)
RX printed and signed and placed in [place]

## 2015-06-13 NOTE — Telephone Encounter (Signed)
Order faxed.

## 2015-06-17 ENCOUNTER — Telehealth: Payer: Self-pay

## 2015-06-17 NOTE — Telephone Encounter (Signed)
This will have to wait until Logan Day's return. I do see where is was discontinued during anticoag visit.

## 2015-06-17 NOTE — Telephone Encounter (Signed)
[name] pts son left v/m requesting refill tramadol to [facility]. Per hx med list 06/04/15 tramadol d/c by provider.Please advise.

## 2015-06-18 ENCOUNTER — Other Ambulatory Visit: Payer: Medicare Other

## 2015-06-19 ENCOUNTER — Telehealth: Payer: Self-pay | Admitting: Primary Care

## 2015-06-19 NOTE — Telephone Encounter (Signed)
Patient's son called to let Anda Kraft know patient will be switching to the doctor at [facility].

## 2015-06-19 NOTE — Telephone Encounter (Signed)
This seems appropriate. It is hard for [place] to manage patients when they are in a facility like that.

## 2015-06-22 ENCOUNTER — Telehealth: Payer: Self-pay | Admitting: Primary Care

## 2015-06-22 NOTE — Telephone Encounter (Signed)
Noted. Will contact patient's son to ensure care has been transitioned.

## 2015-06-22 NOTE — Telephone Encounter (Signed)
Noted. Will hold off as he has transitioned over to MD at assisted living facility.

## 2015-06-23 ENCOUNTER — Telehealth: Payer: Self-pay | Admitting: Primary Care

## 2015-06-23 NOTE — Telephone Encounter (Signed)
Spoke with Mr. Logan Day son who reports he's transitioned over to the MD at assisted living.

## 2015-07-08 ENCOUNTER — Telehealth: Payer: Self-pay | Admitting: Primary Care

## 2015-07-08 NOTE — Telephone Encounter (Signed)
Will you please call Logan Day son. Has Logan Day been transitioned to another provider since being in assisted living? I thought this was the plan. Logan Day is calling stating that I'm still the PCP. Will you please confirm and then notify [facility]. [phone], hit the first option for pharmacist line.

## 2015-07-08 NOTE — Telephone Encounter (Signed)
Called and spoken to patient's son [name]). He stated that patient has been transitioned to another provider who works at the [place] living. Notified patient's son that would like [facility] and let them know that he is no longer under the care of Anda Kraft. Called and spoken to a rep at [facility] and she stated they will have it change and they will contact patient's caregiver.

## 2015-08-03 DIAGNOSIS — K922 Gastrointestinal hemorrhage, unspecified: Secondary | ICD-10-CM

## 2015-08-03 HISTORY — DX: Gastrointestinal hemorrhage, unspecified: K92.2

## 2015-08-26 ENCOUNTER — Inpatient Hospital Stay (HOSPITAL_COMMUNITY)
Admission: EM | Admit: 2015-08-26 | Discharge: 2015-08-28 | DRG: 377 | Disposition: A | Discharge status: Home / Self Care | Payer: Medicare Other | Attending: Internal Medicine | Admitting: Internal Medicine

## 2015-08-26 ENCOUNTER — Encounter (HOSPITAL_COMMUNITY): Payer: Self-pay | Admitting: Emergency Medicine

## 2015-08-26 ENCOUNTER — Emergency Department (HOSPITAL_COMMUNITY): Payer: Medicare Other

## 2015-08-26 ENCOUNTER — Inpatient Hospital Stay (HOSPITAL_COMMUNITY): Payer: Medicare Other

## 2015-08-26 DIAGNOSIS — K922 Gastrointestinal hemorrhage, unspecified: Secondary | ICD-10-CM | POA: Diagnosis present

## 2015-08-26 DIAGNOSIS — Z8546 Personal history of malignant neoplasm of prostate: Secondary | ICD-10-CM | POA: Diagnosis not present

## 2015-08-26 DIAGNOSIS — R4182 Altered mental status, unspecified: Secondary | ICD-10-CM | POA: Diagnosis present

## 2015-08-26 DIAGNOSIS — F411 Generalized anxiety disorder: Secondary | ICD-10-CM

## 2015-08-26 DIAGNOSIS — Z6821 Body mass index (BMI) 21.0-21.9, adult: Secondary | ICD-10-CM | POA: Diagnosis not present

## 2015-08-26 DIAGNOSIS — K5791 Diverticulosis of intestine, part unspecified, without perforation or abscess with bleeding: Principal | ICD-10-CM | POA: Diagnosis present

## 2015-08-26 DIAGNOSIS — I252 Old myocardial infarction: Secondary | ICD-10-CM

## 2015-08-26 DIAGNOSIS — D62 Acute posthemorrhagic anemia: Secondary | ICD-10-CM | POA: Diagnosis present

## 2015-08-26 DIAGNOSIS — E7800 Pure hypercholesterolemia, unspecified: Secondary | ICD-10-CM | POA: Diagnosis present

## 2015-08-26 DIAGNOSIS — I5042 Chronic combined systolic (congestive) and diastolic (congestive) heart failure: Secondary | ICD-10-CM | POA: Diagnosis present

## 2015-08-26 DIAGNOSIS — E43 Unspecified severe protein-calorie malnutrition: Secondary | ICD-10-CM | POA: Diagnosis present

## 2015-08-26 DIAGNOSIS — Z7982 Long term (current) use of aspirin: Secondary | ICD-10-CM | POA: Diagnosis not present

## 2015-08-26 DIAGNOSIS — D500 Iron deficiency anemia secondary to blood loss (chronic): Secondary | ICD-10-CM | POA: Diagnosis not present

## 2015-08-26 DIAGNOSIS — K219 Gastro-esophageal reflux disease without esophagitis: Secondary | ICD-10-CM | POA: Diagnosis present

## 2015-08-26 DIAGNOSIS — Z955 Presence of coronary angioplasty implant and graft: Secondary | ICD-10-CM | POA: Diagnosis not present

## 2015-08-26 DIAGNOSIS — Z7901 Long term (current) use of anticoagulants: Secondary | ICD-10-CM

## 2015-08-26 DIAGNOSIS — I2510 Atherosclerotic heart disease of native coronary artery without angina pectoris: Secondary | ICD-10-CM | POA: Diagnosis present

## 2015-08-26 DIAGNOSIS — G4700 Insomnia, unspecified: Secondary | ICD-10-CM | POA: Diagnosis present

## 2015-08-26 DIAGNOSIS — J449 Chronic obstructive pulmonary disease, unspecified: Secondary | ICD-10-CM | POA: Diagnosis present

## 2015-08-26 DIAGNOSIS — K5900 Constipation, unspecified: Secondary | ICD-10-CM | POA: Diagnosis present

## 2015-08-26 DIAGNOSIS — F17210 Nicotine dependence, cigarettes, uncomplicated: Secondary | ICD-10-CM | POA: Diagnosis present

## 2015-08-26 DIAGNOSIS — N182 Chronic kidney disease, stage 2 (mild): Secondary | ICD-10-CM | POA: Diagnosis present

## 2015-08-26 DIAGNOSIS — E46 Unspecified protein-calorie malnutrition: Secondary | ICD-10-CM | POA: Diagnosis present

## 2015-08-26 DIAGNOSIS — K5909 Other constipation: Secondary | ICD-10-CM

## 2015-08-26 DIAGNOSIS — E785 Hyperlipidemia, unspecified: Secondary | ICD-10-CM | POA: Diagnosis present

## 2015-08-26 DIAGNOSIS — D689 Coagulation defect, unspecified: Secondary | ICD-10-CM | POA: Diagnosis present

## 2015-08-26 DIAGNOSIS — Z66 Do not resuscitate: Secondary | ICD-10-CM | POA: Diagnosis present

## 2015-08-26 DIAGNOSIS — I130 Hypertensive heart and chronic kidney disease with heart failure and stage 1 through stage 4 chronic kidney disease, or unspecified chronic kidney disease: Secondary | ICD-10-CM | POA: Diagnosis present

## 2015-08-26 DIAGNOSIS — F418 Other specified anxiety disorders: Secondary | ICD-10-CM | POA: Diagnosis present

## 2015-08-26 DIAGNOSIS — R5381 Other malaise: Secondary | ICD-10-CM

## 2015-08-26 DIAGNOSIS — I4891 Unspecified atrial fibrillation: Secondary | ICD-10-CM

## 2015-08-26 DIAGNOSIS — I480 Paroxysmal atrial fibrillation: Secondary | ICD-10-CM | POA: Diagnosis present

## 2015-08-26 DIAGNOSIS — I10 Essential (primary) hypertension: Secondary | ICD-10-CM | POA: Diagnosis not present

## 2015-08-26 DIAGNOSIS — D649 Anemia, unspecified: Secondary | ICD-10-CM

## 2015-08-26 DIAGNOSIS — I25119 Atherosclerotic heart disease of native coronary artery with unspecified angina pectoris: Secondary | ICD-10-CM

## 2015-08-26 DIAGNOSIS — F0390 Unspecified dementia without behavioral disturbance: Secondary | ICD-10-CM | POA: Diagnosis present

## 2015-08-26 DIAGNOSIS — I69320 Aphasia following cerebral infarction: Secondary | ICD-10-CM

## 2015-08-26 DIAGNOSIS — Z79899 Other long term (current) drug therapy: Secondary | ICD-10-CM | POA: Diagnosis not present

## 2015-08-26 HISTORY — DX: Gastrointestinal hemorrhage, unspecified: K92.2

## 2015-08-26 LAB — I-STAT CHEM 8, ED
BUN: 28 mg/dL — AB (ref 6–20)
CALCIUM ION: 1.11 mmol/L — AB (ref 1.13–1.30)
CHLORIDE: 106 mmol/L (ref 101–111)
Creatinine, Ser: 1.6 mg/dL — ABNORMAL HIGH (ref 0.61–1.24)
GLUCOSE: 84 mg/dL (ref 65–99)
HCT: 23 % — ABNORMAL LOW (ref 39.0–52.0)
Hemoglobin: 7.8 g/dL — ABNORMAL LOW (ref 13.0–17.0)
POTASSIUM: 4.2 mmol/L (ref 3.5–5.1)
Sodium: 141 mmol/L (ref 135–145)
TCO2: 23 mmol/L (ref 0–100)

## 2015-08-26 LAB — URINALYSIS, ROUTINE W REFLEX MICROSCOPIC
BILIRUBIN URINE: NEGATIVE
Glucose, UA: NEGATIVE mg/dL
HGB URINE DIPSTICK: NEGATIVE
Ketones, ur: NEGATIVE mg/dL
Leukocytes, UA: NEGATIVE
Nitrite: NEGATIVE
PROTEIN: NEGATIVE mg/dL
Specific Gravity, Urine: 1.013 (ref 1.005–1.030)
pH: 7.5 (ref 5.0–8.0)

## 2015-08-26 LAB — COMPREHENSIVE METABOLIC PANEL
ALBUMIN: 3 g/dL — AB (ref 3.5–5.0)
ALK PHOS: 55 U/L (ref 38–126)
ALT: 17 U/L (ref 17–63)
AST: 28 U/L (ref 15–41)
Anion gap: 10 (ref 5–15)
BILIRUBIN TOTAL: 0.6 mg/dL (ref 0.3–1.2)
BUN: 24 mg/dL — AB (ref 6–20)
CALCIUM: 8.4 mg/dL — AB (ref 8.9–10.3)
CO2: 22 mmol/L (ref 22–32)
CREATININE: 1.74 mg/dL — AB (ref 0.61–1.24)
Chloride: 107 mmol/L (ref 101–111)
GFR calc Af Amer: 41 mL/min — ABNORMAL LOW (ref >60)
GFR calc non Af Amer: 35 mL/min — ABNORMAL LOW (ref >60)
GLUCOSE: 86 mg/dL (ref 65–99)
Potassium: 4.2 mmol/L (ref 3.5–5.1)
Sodium: 139 mmol/L (ref 135–145)
TOTAL PROTEIN: 6.5 g/dL (ref 6.5–8.1)

## 2015-08-26 LAB — CBC WITH DIFFERENTIAL/PLATELET
BASOS ABS: 0 10*3/uL (ref 0.0–0.1)
BASOS PCT: 1 %
EOS ABS: 0.3 10*3/uL (ref 0.0–0.7)
Eosinophils Relative: 5 %
HCT: 20.1 % — ABNORMAL LOW (ref 39.0–52.0)
Hemoglobin: 6.5 g/dL — CL (ref 13.0–17.0)
Lymphocytes Relative: 14 %
Lymphs Abs: 0.8 10*3/uL (ref 0.7–4.0)
MCH: 28.3 pg (ref 26.0–34.0)
MCHC: 32.3 g/dL (ref 30.0–36.0)
MCV: 87.4 fL (ref 78.0–100.0)
MONO ABS: 0.4 10*3/uL (ref 0.1–1.0)
Monocytes Relative: 8 %
NEUTROS ABS: 4.2 10*3/uL (ref 1.7–7.7)
NEUTROS PCT: 73 %
Platelets: 219 10*3/uL (ref 150–400)
RBC: 2.3 MIL/uL — ABNORMAL LOW (ref 4.22–5.81)
RDW: 15.6 % — ABNORMAL HIGH (ref 11.5–15.5)
WBC: 5.8 10*3/uL (ref 4.0–10.5)

## 2015-08-26 LAB — LIPASE, BLOOD: Lipase: 25 U/L (ref 11–51)

## 2015-08-26 LAB — POC OCCULT BLOOD, ED: Fecal Occult Bld: POSITIVE — AB

## 2015-08-26 LAB — PROTIME-INR
INR: 3.2 — ABNORMAL HIGH (ref 0.00–1.49)
Prothrombin Time: 32.1 seconds — ABNORMAL HIGH (ref 11.6–15.2)

## 2015-08-26 LAB — ABO/RH: ABO/RH(D): A POS

## 2015-08-26 LAB — PREPARE RBC (CROSSMATCH)

## 2015-08-26 MED ORDER — ENSURE ENLIVE PO LIQD
237.0000 mL | Freq: Two times a day (BID) | ORAL | Status: DC
  Administered 2015-08-27: 237 mL via ORAL
  Filled 2015-08-26: qty 237

## 2015-08-26 MED ORDER — SODIUM CHLORIDE 0.9% FLUSH
3.0000 mL | Freq: Two times a day (BID) | INTRAVENOUS | Status: DC
  Administered 2015-08-27 – 2015-08-28 (×2): 3 mL via INTRAVENOUS

## 2015-08-26 MED ORDER — ACETAMINOPHEN 650 MG RE SUPP: 650.0000 mg | Freq: Four times a day (QID) | RECTAL | Status: DC | PRN

## 2015-08-26 MED ORDER — VITAMIN K1 10 MG/ML IJ SOLN
10.0000 mg | Freq: Once | INTRAVENOUS | Status: AC
  Administered 2015-08-26: 10 mg via INTRAVENOUS
  Filled 2015-08-26: qty 1

## 2015-08-26 MED ORDER — SODIUM CHLORIDE 0.9 % IV SOLN
80.0000 mg | Freq: Once | INTRAVENOUS | Status: AC
  Administered 2015-08-26: 80 mg via INTRAVENOUS
  Filled 2015-08-26: qty 80

## 2015-08-26 MED ORDER — SODIUM CHLORIDE 0.9 % IV SOLN
8.0000 mg/h | INTRAVENOUS | Status: DC
  Administered 2015-08-26 – 2015-08-28 (×4): 8 mg/h via INTRAVENOUS
  Filled 2015-08-26 (×9): qty 80

## 2015-08-26 MED ORDER — PANTOPRAZOLE SODIUM 40 MG IV SOLR: 40.0000 mg | Freq: Two times a day (BID) | INTRAVENOUS | Status: DC

## 2015-08-26 MED ORDER — SODIUM CHLORIDE 0.9 % IV SOLN: Freq: Once | INTRAVENOUS | Status: DC

## 2015-08-26 MED ORDER — SODIUM CHLORIDE 0.45 % IV SOLN
INTRAVENOUS | Status: DC
  Administered 2015-08-27 – 2015-08-28 (×2): via INTRAVENOUS

## 2015-08-26 MED ORDER — SODIUM CHLORIDE 0.9 % IV BOLUS (SEPSIS)
500.0000 mL | Freq: Once | INTRAVENOUS | Status: AC
  Administered 2015-08-26: 500 mL via INTRAVENOUS

## 2015-08-26 MED ORDER — ONDANSETRON HCL 4 MG/2ML IJ SOLN: 4.0000 mg | Freq: Four times a day (QID) | INTRAMUSCULAR | Status: DC | PRN

## 2015-08-26 MED ORDER — BUPROPION HCL ER (SR) 150 MG PO TB12
150.0000 mg | ORAL_TABLET | Freq: Two times a day (BID) | ORAL | Status: DC
  Administered 2015-08-26 – 2015-08-28 (×4): 150 mg via ORAL
  Filled 2015-08-26 (×6): qty 1

## 2015-08-26 MED ORDER — ACETAMINOPHEN 325 MG PO TABS: 650.0000 mg | ORAL_TABLET | Freq: Four times a day (QID) | ORAL | Status: DC | PRN

## 2015-08-26 MED ORDER — ONDANSETRON HCL 4 MG PO TABS: 4.0000 mg | ORAL_TABLET | Freq: Four times a day (QID) | ORAL | Status: DC | PRN

## 2015-08-26 MED ORDER — SERTRALINE HCL 100 MG PO TABS
100.0000 mg | ORAL_TABLET | Freq: Every evening | ORAL | Status: DC
  Administered 2015-08-27: 100 mg via ORAL
  Filled 2015-08-26: qty 1

## 2015-08-26 MED ORDER — AMIODARONE HCL 200 MG PO TABS
200.0000 mg | ORAL_TABLET | Freq: Every day | ORAL | Status: DC
  Administered 2015-08-27 – 2015-08-28 (×2): 200 mg via ORAL
  Filled 2015-08-26 (×2): qty 1

## 2015-08-26 MED ORDER — SODIUM CHLORIDE 0.9 % IV SOLN
Freq: Once | INTRAVENOUS | Status: AC
  Administered 2015-08-26: 17:00:00 via INTRAVENOUS

## 2015-08-26 MED ORDER — SODIUM CHLORIDE 0.9 % IV SOLN: 80.0000 mg | Freq: Once | INTRAVENOUS | Status: DC

## 2015-08-26 MED ORDER — METOPROLOL TARTRATE 50 MG PO TABS
50.0000 mg | ORAL_TABLET | Freq: Two times a day (BID) | ORAL | Status: DC
  Administered 2015-08-26: 50 mg via ORAL
  Filled 2015-08-26 (×4): qty 1

## 2015-08-26 MED ORDER — PRAVASTATIN SODIUM 40 MG PO TABS
40.0000 mg | ORAL_TABLET | Freq: Every evening | ORAL | Status: DC
  Administered 2015-08-27: 40 mg via ORAL
  Filled 2015-08-26: qty 1

## 2015-08-26 MED ORDER — ALPRAZOLAM 0.5 MG PO TABS
0.5000 mg | ORAL_TABLET | Freq: Two times a day (BID) | ORAL | Status: DC | PRN
Start: 2015-08-26 — End: 2015-08-28
  Administered 2015-08-27: 0.5 mg via ORAL
  Filled 2015-08-26: qty 1

## 2015-08-26 MED ORDER — TRAZODONE HCL 50 MG PO TABS
50.0000 mg | ORAL_TABLET | Freq: Every day | ORAL | Status: DC
  Administered 2015-08-26 – 2015-08-27 (×2): 50 mg via ORAL
  Filled 2015-08-26 (×2): qty 1

## 2015-08-26 NOTE — ED Notes (Signed)
Per [facility], pt from [facility] Care, Pt son showed up and noticed pt very pale, not responsive like he normally is. Sugar is 73, BP palpated 118. Normal baseline mentation is alerted, unsure how altered. Pt son is on the way. Concerned mostly for pt skin color and shallow respirations. Pt eyes are open, asking about his son "david".

## 2015-08-26 NOTE — H&P (Signed)
[facility] History and Physical  PRINCETON CARNAHAN [id number] DOB: 03-02-1933 DOA: 08/26/2015  Referring physician: Dr Billy Fischer PCP: No primary care provider on file.   Chief Complaint: Generalized weakness and GI bleed  HPI: Logan Day is a 82 y.o. male  Level V caveat: Patient presenting with dementia and aphasia from previous stroke. Very difficult to understand and to get clear history. History provided in part by patient, patient's son, and EDP. Patient presenting for generalized weakness, confusion and constipation. Upon further discussion patient apparently had one or 2 episodes of profuse bloody diarrhea approximately one week ago and since that time patient states that he's been unable to have a bowel movement. Patient is becoming progressively more weak throughout. No focal upper body. No recent change medications. Patient lives at an assisted living facility and given his medications daily. On blood thinners. Associated with some headaches, lightheadedness and nausea. No reported changes in behavior assisted living facility. Denies fevers, diarrhea, dysuria, chest pain, shortness of breath.   Review of Systems:  Per history of present illness with all other systems negative. Unable to obtain significant RS due to patient's mental status  Past Medical History  Diagnosis Date  . Hypertension   . Atrial fibrillation ([place])   . Hypercholesteremia   . MI (myocardial infarction) ([place])     x 2  . Angina   . Cancer [facility])     prostate cancer  . Coronary artery disease     Circ stent (2009 cath patent)  . Prostate cancer ([place])   . Hyperlipidemia   . TIA (transient ischemic attack)   . GERD (gastroesophageal reflux disease)   . Shortness of breath dyspnea   . COPD (chronic obstructive pulmonary disease) ([place])   . Dementia   . Congestive heart failure [facility])    Past Surgical History  Procedure Laterality Date  . Prostayectomy    . Fracture surgery    .  Coronary angioplasty with stent placement  10/19/2012    DES  to circumflex  . Percutaneous coronary stent intervention (pci-s) N/A 10/19/2012    Procedure: PERCUTANEOUS CORONARY STENT INTERVENTION (PCI-S);  Surgeon: Sinclair Grooms, MD;  Location: [facility] CATH LAB;  Service: Cardiovascular;  Laterality: N/A;   Social History:  reports that he has quit smoking. He has quit using smokeless tobacco. His smokeless tobacco use included Chew. He reports that he does not drink alcohol or use illicit drugs.  No Known Allergies  Family History  Problem Relation Age of Onset  . Coronary artery disease Son   . Heart disease Mother   . Heart disease Father      Prior to Admission medications   Medication Sig Start Date End Date Taking? Authorizing Provider  ALPRAZolam Duanne Moron) 0.5 MG tablet Take 1 tablet (0.5 mg total) by mouth 2 (two) times daily as needed for anxiety. 05/29/15  Yes Pleas Koch, NP  amiodarone (PACERONE) 200 MG tablet Take 1 tablet (200 mg total) by mouth daily. 01/22/15  Yes Belva Crome, MD  aspirin EC 81 MG tablet Take 1 tablet (81 mg total) by mouth daily. 12/18/13  Yes Belva Crome, MD  buPROPion [facility] SR) 150 MG 12 hr tablet Take 150 mg by mouth 2 (two) times daily.  08/01/14  Yes Historical Provider, MD  feeding supplement, ENSURE ENLIVE, (ENSURE ENLIVE) LIQD Take 237 mLs by mouth 2 (two) times daily after a meal. Patient taking differently: Take 237 mLs by mouth 2 (two) times daily between  meals.  12/04/14  Yes Modena Jansky, MD  metoprolol (LOPRESSOR) 50 MG tablet Take 50 mg by mouth 2 (two) times daily. 04/23/15  Yes Historical Provider, MD  Multiple Vitamin (MULTIVITAMIN WITH MINERALS) TABS tablet Take 1 tablet by mouth every evening.   Yes Historical Provider, MD  omeprazole (PRILOSEC) 20 MG capsule Take 20 mg by mouth every morning.   Yes Historical Provider, MD  pravastatin (PRAVACHOL) 40 MG tablet Take 40 mg by mouth every evening.   Yes Historical Provider, MD    Rivaroxaban (XARELTO) 15 MG TABS tablet Take 15 mg by mouth 2 (two) times daily with a meal.    Yes Historical Provider, MD  sertraline (ZOLOFT) 100 MG tablet Take 100 mg by mouth every evening.   Yes Historical Provider, MD  traZODone (DESYREL) 50 MG tablet Take 50 mg by mouth at bedtime.   Yes Historical Provider, MD  albuterol (PROAIR HFA) 108 (90 BASE) MCG/ACT inhaler Inhale 1-2 puffs into the lungs every 6 (six) hours as needed for wheezing or shortness of breath. 05/06/15   Pleas Koch, NP  bisacodyl (DULCOLAX) 5 MG EC tablet Take 5 mg by mouth daily as needed for moderate constipation.    Historical Provider, MD  nitroGLYCERIN (NITROSTAT) 0.4 MG SL tablet Place 0.4 mg under the tongue every 5 (five) minutes as needed for chest pain.    Historical Provider, MD  sulfamethoxazole-trimethoprim (BACTRIM DS,SEPTRA DS) 800-160 MG tablet Take 1 tablet by mouth 2 (two) times daily. Patient not taking: Reported on 08/26/2015 06/10/15   Pleas Koch, NP   Physical Exam: Filed Vitals:   08/26/15 1657 08/26/15 1730 08/26/15 1745 08/26/15 1800  BP: 144/66 148/69 143/74 152/58  Pulse: 64 63 62 49  Temp: 97.6 F (36.4 C)     TempSrc: Oral     Resp: 12 11 15 14   SpO2: 100% 100% 97% 96%    Wt Readings from Last 3 Encounters:  06/10/15 71.668 kg (158 lb)  05/18/15 66.225 kg (146 lb)  05/05/15 69.31 kg (152 lb 12.8 oz)    General: Elderly and frail Eyes:  PERRL, EOMI, normal lids, iris ENT: dry mm Neck:  no LAD, masses or thyromegaly Cardiovascular: faint heart sounds, RRR, no m/r/g. No LE edema.  Respiratory:  CTA bilaterally, no w/r/r. Normal respiratory effort. Abdomen:  soft, ntnd Skin:  no rash or induration seen on limited exam Musculoskeletal:  grossly weak tone BUE/BLE Psychiatric: AOx3 but only occasionally able to answer questions appropriately  Neurologic:  CN 2-12 grossly intact, moves all extremities in coordinated fashion.          Labs on Admission:  Basic  Metabolic Panel:  Recent Labs Lab 08/26/15 1455 08/26/15 1524  NA 139 141  K 4.2 4.2  CL 107 106  CO2 22  --   GLUCOSE 86 84  BUN 24* 28*  CREATININE 1.74* 1.60*  CALCIUM 8.4*  --    Liver Function Tests:  Recent Labs Lab 08/26/15 1455  AST 28  ALT 17  ALKPHOS 55  BILITOT 0.6  PROT 6.5  ALBUMIN 3.0*    Recent Labs Lab 08/26/15 1455  LIPASE 25   No results for input(s): AMMONIA in the last 168 hours. CBC:  Recent Labs Lab 08/26/15 1455 08/26/15 1524  WBC 5.8  --   NEUTROABS 4.2  --   HGB 6.5* 7.8*  HCT 20.1* 23.0*  MCV 87.4  --   PLT 219  --    Cardiac Enzymes:  No results for input(s): CKTOTAL, CKMB, CKMBINDEX, TROPONINI in the last 168 hours.  BNP (last 3 results)  Recent Labs  11/29/14 1053 12/01/14 0530  BNP 1513.0* 424.9*    ProBNP (last 3 results) No results for input(s): PROBNP in the last 8760 hours.   Creatinine clearance cannot be calculated (Unknown ideal weight.)  CBG: No results for input(s): GLUCAP in the last 168 hours.  Radiological Exams on Admission: Dg Chest 2 View  08/26/2015  CLINICAL DATA:  Headache, prostate cancer, pale EXAM: CHEST  2 VIEW COMPARISON:  05/19/2015 FINDINGS: Cardiomediastinal silhouette is stable. No acute infiltrate or pleural effusion. No pulmonary edema. Osteopenia and mild degenerative changes thoracic spine. Hyperinflation again noted. IMPRESSION: No active disease. Hyperinflation again noted. Osteopenia and mild degenerative changes thoracic spine. Electronically Signed   By: Lahoma Crocker M.D.   On: 08/26/2015 14:50   Ct Head Wo Contrast  08/26/2015  CLINICAL DATA:  Altered mental status and headache today. Initial encounter. EXAM: CT HEAD WITHOUT CONTRAST TECHNIQUE: Contiguous axial images were obtained from the base of the skull through the vertex without intravenous contrast. COMPARISON:  Brain MRI 07/16/2011.  Head CT scan 07/15/2011. FINDINGS: The brain is atrophic with extensive chronic  microvascular ischemic change. Remote bilateral posterior temporal and parietal infarcts are identified. No evidence of acute infarction, hemorrhage, mass lesion, midline shift or abnormal extra-axial fluid collection is seen. No pneumocephalus or hydrocephalus. The calvarium is intact. Mucous retention cysts or polyps right maxillary sinus noted. Mild ethmoid air cell disease is seen on the right. IMPRESSION: No acute abnormality. Remote bilateral parietotemporal infarcts. Atrophy and chronic microvascular ischemic change. Electronically Signed   By: Inge Rise M.D.   On: 08/26/2015 16:24      Assessment/Plan Active Problems:   Essential hypertension   Hyperlipidemia   PAF (paroxysmal atrial fibrillation) (HCC)   GI bleed   Symptomatic anemia   CKD (chronic kidney disease), stage II   Physical deconditioning   Protein calorie malnutrition (HCC)  GI bleed/symptomatic anemia: Hemoccult positive, history of abdominal pain and profuse diarrhea 1 week ago though has been constipated since that time. Hemoglobin 6.5. Baseline hemoglobin 9-11 with apparent downward trend since October 2016 when hemoglobin was 12.4. Suspect slow diverticular bleed while on Xarelto. Cannot exclude upper GI bleed but unlikely. Patient unsure of last colonoscopy but states it was normal approximately 15 years ago. - Tele - 2 units PRBC per ED - GI consult - Hold Xarelto - NPO - continue PPI drip - KUB  Afib: Paroxysmal. Currently sinus. On Xarelto. Rate controlled. CHA2DS2-VASc = 7 (11.2% yearly risk). HAS-BLED: 5 (9.1% yearly risk) - hold Xarelto. (consider permenant DC - family discussing) - continue metop, amiodarone  CKD: Cr 1.6. At baseline - BMEt in am - mild hydration  Physical deconditioning and protein calorie malnutrition: - PT/OT - continue home ensure - nutrition consult  Systolic CHF: Last EF [id number]. No decompensation. - continue bblocker  Dementia/AMS: Baseline dementia and aphasia from  previous stroke. Appears somewhat worse than baseline but difficult to ascertain true mental status. CT head normal. - monitor  Insomnia:  - continue trazodone  Depression/Anxiety: - continyue xanax, wellbutrin, soloft  HLD: - continue statin  GERD: - PPI as above    Code Status: DNR  DVT Prophylaxis: SCD Family Communication: son and grandson Disposition Plan: Pending Improvement    Alaiyah Bollman Lenna Sciara, MD Family Medicine [facility] [email or web address] Password [email or web address]

## 2015-08-26 NOTE — ED Provider Notes (Signed)
CSN: [id number]     Arrival date & time 08/26/15  1333 History   First MD Initiated Contact with Patient 08/26/15 1336     Chief Complaint  Patient presents with  . Altered Mental Status     (Consider location/radiation/quality/duration/timing/severity/associated sxs/prior Treatment) HPI Comments: Day before yesterday, reported blurred vision, dizzy/lightheaded when standing up Today looks pale Blood in stool, starting talking about it end of week Constipated Generalized weakness, "couldnt poop, couldn't pee" Couldn't stand, very weak, HR 50s At assisted living [facility]  Patient is a 82 y.o. male presenting with altered mental status. History limited by: dementia.  Altered Mental Status Associated symptoms: headaches, light-headedness and nausea   Associated symptoms: no abdominal pain, no fever, no rash and no vomiting   Dr. Mordecai Maes [facility] Lynnell Catalan NP Onsite care  Past Medical History  Diagnosis Date  . Hypertension   . Atrial fibrillation ([place])   . Hypercholesteremia   . MI (myocardial infarction) ([place])     x 2  . Angina   . Cancer [facility])     prostate cancer  . Coronary artery disease     Circ stent (2009 cath patent)  . Prostate cancer ([place])   . Hyperlipidemia   . TIA (transient ischemic attack)   . GERD (gastroesophageal reflux disease)   . Shortness of breath dyspnea   . COPD (chronic obstructive pulmonary disease) ([place])   . Dementia   . Congestive heart failure [facility])    Past Surgical History  Procedure Laterality Date  . Prostayectomy    . Fracture surgery    . Coronary angioplasty with stent placement  10/19/2012    DES  to circumflex  . Percutaneous coronary stent intervention (pci-s) N/A 10/19/2012    Procedure: PERCUTANEOUS CORONARY STENT INTERVENTION (PCI-S);  Surgeon: Sinclair Grooms, MD;  Location: [facility] CATH LAB;  Service: Cardiovascular;  Laterality: N/A;   Family History  Problem Relation Age of Onset  . Coronary artery  disease Son   . Heart disease Mother   . Heart disease Father    Social History  Substance Use Topics  . Smoking status: Former Smoker -- 1.00 packs/day for 40 years  . Smokeless tobacco: Former Systems developer    Types: Chew     Comment: quit smoking 2002  . Alcohol Use: No    Review of Systems  Unable to perform ROS: Dementia  Constitutional: Positive for fatigue. Negative for fever.  HENT: Negative for congestion.   Eyes: Negative for visual disturbance.  Respiratory: Negative for cough and shortness of breath (no change in baseline COPD).   Cardiovascular: Negative for chest pain.  Gastrointestinal: Positive for nausea, constipation, blood in stool and anal bleeding. Negative for vomiting, abdominal pain and diarrhea.  Genitourinary: Positive for difficulty urinating.  Musculoskeletal: Negative for back pain.  Skin: Negative for rash.  Neurological: Positive for light-headedness and headaches.      Allergies  Review of patient's allergies indicates no known allergies.  Home Medications   Prior to Admission medications   Medication Sig Start Date End Date Taking? Authorizing Provider  ALPRAZolam Duanne Moron) 0.5 MG tablet Take 1 tablet (0.5 mg total) by mouth 2 (two) times daily as needed for anxiety. 05/29/15  Yes Pleas Koch, NP  amiodarone (PACERONE) 200 MG tablet Take 1 tablet (200 mg total) by mouth daily. 01/22/15  Yes Belva Crome, MD  aspirin EC 81 MG tablet Take 1 tablet (81 mg total) by mouth daily. 12/18/13  Yes  Belva Crome, MD  buPROPion [facility] SR) 150 MG 12 hr tablet Take 150 mg by mouth 2 (two) times daily.  08/01/14  Yes Historical Provider, MD  feeding supplement, ENSURE ENLIVE, (ENSURE ENLIVE) LIQD Take 237 mLs by mouth 2 (two) times daily after a meal. Patient taking differently: Take 237 mLs by mouth 2 (two) times daily between meals.  12/04/14  Yes Modena Jansky, MD  metoprolol (LOPRESSOR) 50 MG tablet Take 50 mg by mouth 2 (two) times daily. 04/23/15  Yes  Historical Provider, MD  Multiple Vitamin (MULTIVITAMIN WITH MINERALS) TABS tablet Take 1 tablet by mouth every evening.   Yes Historical Provider, MD  omeprazole (PRILOSEC) 20 MG capsule Take 20 mg by mouth every morning.   Yes Historical Provider, MD  pravastatin (PRAVACHOL) 40 MG tablet Take 40 mg by mouth every evening.   Yes Historical Provider, MD  Rivaroxaban (XARELTO) 15 MG TABS tablet Take 15 mg by mouth 2 (two) times daily with a meal.    Yes Historical Provider, MD  sertraline (ZOLOFT) 100 MG tablet Take 100 mg by mouth every evening.   Yes Historical Provider, MD  traZODone (DESYREL) 50 MG tablet Take 50 mg by mouth at bedtime.   Yes Historical Provider, MD  albuterol (PROAIR HFA) 108 (90 BASE) MCG/ACT inhaler Inhale 1-2 puffs into the lungs every 6 (six) hours as needed for wheezing or shortness of breath. 05/06/15   Pleas Koch, NP  bisacodyl (DULCOLAX) 5 MG EC tablet Take 5 mg by mouth daily as needed for moderate constipation.    Historical Provider, MD  nitroGLYCERIN (NITROSTAT) 0.4 MG SL tablet Place 0.4 mg under the tongue every 5 (five) minutes as needed for chest pain.    Historical Provider, MD  sulfamethoxazole-trimethoprim (BACTRIM DS,SEPTRA DS) 800-160 MG tablet Take 1 tablet by mouth 2 (two) times daily. Patient not taking: Reported on 08/26/2015 06/10/15   Pleas Koch, NP   BP 144/66 mmHg  Pulse 64  Temp(Src) 97.6 F (36.4 C) (Oral)  Resp 12  SpO2 100% Physical Exam  Constitutional: He is oriented to person, place, and time. He appears well-developed and well-nourished. No distress.  HENT:  Head: Normocephalic and atraumatic.  Eyes: Conjunctivae and EOM are normal.  Neck: Normal range of motion.  Cardiovascular: Normal rate, regular rhythm, normal heart sounds and intact distal pulses.  Exam reveals no gallop and no friction rub.   No murmur heard. Pulmonary/Chest: Effort normal and breath sounds normal. No respiratory distress. He has no wheezes. He has  no rales.  Abdominal: Soft. He exhibits no distension. There is no tenderness. There is no guarding.  Genitourinary: Guaiac positive stool. Prostate is enlarged.  Maroon colored stool  Musculoskeletal: He exhibits no edema.  Neurological: He is alert and oriented to person, place, and time.  Skin: Skin is warm and dry. He is not diaphoretic. There is pallor.  Nursing note and vitals reviewed.   ED Course  Procedures (including critical care time) Labs Review Labs Reviewed  CBC WITH DIFFERENTIAL/PLATELET - Abnormal; Notable for the following:    RBC 2.30 (*)    Hemoglobin 6.5 (*)    HCT 20.1 (*)    RDW 15.6 (*)    All other components within normal limits  COMPREHENSIVE METABOLIC PANEL - Abnormal; Notable for the following:    BUN 24 (*)    Creatinine, Ser 1.74 (*)    Calcium 8.4 (*)    Albumin 3.0 (*)    GFR calc  non Af Amer 35 (*)    GFR calc Af Amer 41 (*)    All other components within normal limits  PROTIME-INR - Abnormal; Notable for the following:    Prothrombin Time 32.1 (*)    INR 3.20 (*)    All other components within normal limits  POC OCCULT BLOOD, ED - Abnormal; Notable for the following:    Fecal Occult Bld POSITIVE (*)    All other components within normal limits  I-STAT CHEM 8, ED - Abnormal; Notable for the following:    BUN 28 (*)    Creatinine, Ser 1.60 (*)    Calcium, Ion 1.11 (*)    Hemoglobin 7.8 (*)    HCT 23.0 (*)    All other components within normal limits  URINE CULTURE  LIPASE, BLOOD  URINALYSIS, ROUTINE W REFLEX MICROSCOPIC (NOT AT [facility])  TYPE AND SCREEN  PREPARE RBC (CROSSMATCH)  ABO/RH    Imaging Review Dg Chest 2 View  08/26/2015  CLINICAL DATA:  Headache, prostate cancer, pale EXAM: CHEST  2 VIEW COMPARISON:  05/19/2015 FINDINGS: Cardiomediastinal silhouette is stable. No acute infiltrate or pleural effusion. No pulmonary edema. Osteopenia and mild degenerative changes thoracic spine. Hyperinflation again noted. IMPRESSION: No  active disease. Hyperinflation again noted. Osteopenia and mild degenerative changes thoracic spine. Electronically Signed   By: Lahoma Crocker M.D.   On: 08/26/2015 14:50   Ct Head Wo Contrast  08/26/2015  CLINICAL DATA:  Altered mental status and headache today. Initial encounter. EXAM: CT HEAD WITHOUT CONTRAST TECHNIQUE: Contiguous axial images were obtained from the base of the skull through the vertex without intravenous contrast. COMPARISON:  Brain MRI 07/16/2011.  Head CT scan 07/15/2011. FINDINGS: The brain is atrophic with extensive chronic microvascular ischemic change. Remote bilateral posterior temporal and parietal infarcts are identified. No evidence of acute infarction, hemorrhage, mass lesion, midline shift or abnormal extra-axial fluid collection is seen. No pneumocephalus or hydrocephalus. The calvarium is intact. Mucous retention cysts or polyps right maxillary sinus noted. Mild ethmoid air cell disease is seen on the right. IMPRESSION: No acute abnormality. Remote bilateral parietotemporal infarcts. Atrophy and chronic microvascular ischemic change. Electronically Signed   By: Inge Rise M.D.   On: 08/26/2015 16:24   I have personally reviewed and evaluated these images and lab results as part of my medical decision-making.   EKG Interpretation   Date/Time:  Tuesday August 26 2015 13:50:46 EST Ventricular Rate:  64 PR Interval:    QRS Duration: 187 QT Interval:  499 QTC Calculation: 515 R Axis:   62 Text Interpretation:  Sinus bradycardia Nonspecific intraventricular  conduction delay Anteroseptal infarct, old Minimal ST depression, lateral  leads No significant change since last tracing Confirmed by [facility] MD,  Numa Heatwole ([id number]) on 08/26/2015 4:21:43 PM      MDM   Final diagnoses:  Gastrointestinal hemorrhage, unspecified gastritis, unspecified gastrointestinal hemorrhage type  Blood loss anemia  Symptomatic anemia  Anticoagulated   82 year old male with a  history of CKD, dementia in assisted living, hyperlipidemia, aphasia status post CVA, paroxysmal atrial fibrillation on Xarelto, CAD, prostate Ca, htn, chronic combined diastolic and systolic CHF, presents with concern of pale appearance, generalized weakness. Patient had reported some blood in stool since Friday, however is a poor historian and unclear amount or quality of bleeding.  Hemoccult shows maroon colored stool, and hemoglobin was 6.5 with INR 3.2.  Patient hemodynamically stable, abdominal exam benign, denies chest pain, no acute bleeding in the ED.  Pt had reported  HA and CT ordered showing no sign of bleeding. Electrolytes WNL. XR without signs of pneumonia.  2U pRBC ordered as well as 80mg  of IV protonix, however suspected lower GI bleed at this time by history and exam.  Will hold on xarelto reversal given pt hemodynamically stable without signs of active bleeding in the ED.  Consulted gastroenterology [facility]. Will give IV vitamin K.  Pt remains hemodynamically stable at this time, mentation at baseline.     Gareth Morgan, MD 08/26/15 1750

## 2015-08-26 NOTE — Progress Notes (Signed)
Received report from [place], RN in ED for transfer of pt to [phone].

## 2015-08-26 NOTE — ED Notes (Signed)
Critical lab notification, hemoglobin 6.7. MD notified.

## 2015-08-26 NOTE — ED Notes (Signed)
Blood bank only has order to prepare only 1 unit of blood, admiting MD paged.

## 2015-08-26 NOTE — ED Notes (Signed)
Spoke with blood bank, one unit ready.

## 2015-08-26 NOTE — Progress Notes (Signed)
NURSING PROGRESS NOTE  Logan Day [id number] Admission Data: 08/26/2015 9:49 PM Attending Provider: Waldemar Dickens, MD PCP:No primary care provider on file. Code Status: DNR  Allergies:  Review of patient's allergies indicates no known allergies. Past Medical History:   has a past medical history of Hypertension; Atrial fibrillation ([place]); Hypercholesteremia; MI (myocardial infarction) ([place]); Angina; Cancer ([place]); Coronary artery disease; Prostate cancer ([place]); Hyperlipidemia; TIA (transient ischemic attack); GERD (gastroesophageal reflux disease); Shortness of breath dyspnea; COPD (chronic obstructive pulmonary disease) ([place]); Dementia; and Congestive heart failure ([place]). Past Surgical History:   has past surgical history that includes prostayectomy; Fracture surgery; Coronary angioplasty with stent (10/19/2012); and percutaneous coronary stent intervention (pci-s) (N/A, 10/19/2012). Social History:   reports that he has quit smoking. He has quit using smokeless tobacco. His smokeless tobacco use included Chew. He reports that he does not drink alcohol or use illicit drugs.  Logan Day is a 82 y.o. male patient admitted from ED:   Last Documented Vital Signs: Blood pressure 136/69, pulse 120, temperature 98.6 F (37 C), temperature source Oral, resp. rate 18, SpO2 98 %.  Cardiac Monitoring: Box # 24 in place. Cardiac monitor yields:normal sinus rhythm.  IV Fluids:  IV in place, occlusive dsg intact without redness, IV cath wrist right, condition patent and no redness and antecubital right, condition patent and no redness none.   Skin: WDL, dry  Patient/Family orientated to room. Information packet given to patient/family. Admission inpatient armband information verified with patient/family to include name and date of birth and placed on patient arm. Side rails up x 2, fall assessment and education completed with patient/family. Patient/family able to verbalize understanding of  risk associated with falls and verbalized understanding to call for assistance before getting out of bed. Call light within reach. Patient/family able to voice and demonstrate understanding of unit orientation instructions.    Will continue to evaluate and treat per MD orders.   Amaryllis Dyke, RN

## 2015-08-26 NOTE — ED Notes (Signed)
Attempted report 

## 2015-08-27 ENCOUNTER — Encounter (HOSPITAL_COMMUNITY): Payer: Self-pay | Admitting: General Practice

## 2015-08-27 DIAGNOSIS — I480 Paroxysmal atrial fibrillation: Secondary | ICD-10-CM

## 2015-08-27 DIAGNOSIS — D62 Acute posthemorrhagic anemia: Secondary | ICD-10-CM

## 2015-08-27 DIAGNOSIS — E43 Unspecified severe protein-calorie malnutrition: Secondary | ICD-10-CM | POA: Insufficient documentation

## 2015-08-27 LAB — BASIC METABOLIC PANEL
ANION GAP: 6 (ref 5–15)
BUN: 20 mg/dL (ref 6–20)
CALCIUM: 8.1 mg/dL — AB (ref 8.9–10.3)
CO2: 24 mmol/L (ref 22–32)
Chloride: 109 mmol/L (ref 101–111)
Creatinine, Ser: 1.67 mg/dL — ABNORMAL HIGH (ref 0.61–1.24)
GFR calc Af Amer: 43 mL/min — ABNORMAL LOW (ref >60)
GFR, EST NON AFRICAN AMERICAN: 37 mL/min — AB (ref >60)
Glucose, Bld: 80 mg/dL (ref 65–99)
POTASSIUM: 4 mmol/L (ref 3.5–5.1)
SODIUM: 139 mmol/L (ref 135–145)

## 2015-08-27 LAB — CBC
HCT: 24.3 % — ABNORMAL LOW (ref 39.0–52.0)
Hemoglobin: 8 g/dL — ABNORMAL LOW (ref 13.0–17.0)
MCH: 27.8 pg (ref 26.0–34.0)
MCHC: 32.9 g/dL (ref 30.0–36.0)
MCV: 84.4 fL (ref 78.0–100.0)
PLATELETS: 174 10*3/uL (ref 150–400)
RBC: 2.88 MIL/uL — AB (ref 4.22–5.81)
RDW: 16 % — AB (ref 11.5–15.5)
WBC: 5.8 10*3/uL (ref 4.0–10.5)

## 2015-08-27 LAB — URINE CULTURE: Culture: NO GROWTH

## 2015-08-27 LAB — MRSA PCR SCREENING: MRSA by PCR: NEGATIVE

## 2015-08-27 MED ORDER — BOOST / RESOURCE BREEZE PO LIQD
1.0000 | Freq: Three times a day (TID) | ORAL | Status: DC
  Administered 2015-08-27 – 2015-08-28 (×4): 1 via ORAL

## 2015-08-27 NOTE — Progress Notes (Signed)
Initial Nutrition Assessment  DOCUMENTATION CODES:   Severe malnutrition in context of chronic illness  INTERVENTION:   -Boost Breeze po TID, each supplement provides 250 kcal and 9 grams of protein  NUTRITION DIAGNOSIS:   Malnutrition related to chronic illness as evidenced by severe depletion of body fat, severe depletion of muscle mass.  GOAL:   Patient will meet greater than or equal to 90% of their needs  MONITOR:   PO intake, Supplement acceptance, Diet advancement, Labs, Weight trends, Skin, I & O's  REASON FOR ASSESSMENT:   Malnutrition Screening Tool    ASSESSMENT:   Patient presenting for generalized weakness, confusion and constipation. Upon further discussion patient apparently had one or 2 episodes of profuse bloody diarrhea approximately one week ago and since that time patient states that he's been unable to have a bowel movement. Patient is becoming progressively more weak throughout. No focal upper body. No recent change medications. Patient lives at an assisted living facility and given his medications daily. On blood thinners. Associated with some headaches, lightheadedness and nausea. No reported changes in behavior assisted living facility. Denies fevers, diarrhea, dysuria, chest pain, shortness of breath.  Pt admitted with GIB and symptomatic anemia.   Spoke with pt at bedside. He reports a decreased appetite of unknown time frame. He reports he generally consumes 3 meals per day, however, has been eating very little as of late due to decreased appetite. He reports he has been drinking chocolate Boost at home, but unable to specify frequency.   Wt hx reviewed and noted wt stability over the past year.   Reviewed GI note from 08/27/15; pt was just advanced to a clear liquid diet with no plans to pursue endoscopy or colonoscopy.   Nutrition-Focused physical exam completed. Findings are moderate to severe fat depletion, moderate to severe muscle depletion, and  no edema.   Labs reviewed.   Diet Order:  Diet clear liquid Room service appropriate?: Yes; Fluid consistency:: Thin  Skin:  Reviewed, no issues  Last BM:  08/26/15  Height:   Ht Readings from Last 1 Encounters:  06/10/15 5\' 11"  (1.803 m)    Weight:   Wt Readings from Last 1 Encounters:  08/26/15 157 lb 3 oz (71.3 kg)    Ideal Body Weight:  78.2 kg  BMI:  Body mass index is 21.93 kg/(m^2).  Estimated Nutritional Needs:   Kcal:  1800-2000  Protein:  90-105 grams  Fluid:  1.8-2.0 L  EDUCATION NEEDS:   No education needs identified at this time  Logan Day A. Jimmye Norman, RD, LDN, CDE Pager: [phone] After hours Pager: [phone]

## 2015-08-27 NOTE — Progress Notes (Signed)
UR COMPLETED  

## 2015-08-27 NOTE — Evaluation (Signed)
Physical Therapy Evaluation Patient Details Name: Logan Day MRN: [id number] DOB: [birth year] Today's Date: 08/27/2015   History of Present Illness  Logan Day is a 82 y.o. male with history of atrial fibrillation on Xarelto, stroke, dementia. Presents for complaints of GI bleeding. Patient is unable to provide any history, but per chart review he was noted to have some bloody diarrhea at his assisted living facility for the past several days. Also some noted weakness and fatigue. Patient denies abdominal pain. No reported hematemesis. He had colonoscopy in 2009 which showed universal diverticulosis otherwise unrevealing. Pt admitted secondary to above w/ dx: GI bleed.  Clinical Impression  Pt admitted with the above complications. Pt currently with functional limitations due to the deficits listed below (see PT Problem List). Demonstrates minor instability with transfer and gait. No overt loss of balance however. Tolerated some higher level dynamic gait tasks which prove challenging. Pt will benefit from HHPT at assisted living facility. Pt will benefit from skilled PT to increase their independence and safety with mobility to allow discharge to the venue listed below.       Follow Up Recommendations Home health PT;Other (comment) (Return to ALF)    Equipment Recommendations  Cane    Recommendations for Other Services       Precautions / Restrictions Precautions Precautions: Fall Precaution Comments: DNR Restrictions Weight Bearing Restrictions: No      Mobility  Bed Mobility Overal bed mobility: Modified Independent             General bed mobility comments: extra time  Transfers Overall transfer level: Needs assistance Equipment used: None Transfers: Sit to/from Stand Sit to Stand: Supervision Stand pivot transfers: Min guard       General transfer comment: supervision for safety. Mild sway noted but able to self correct without reaching for  furniture.   Ambulation/Gait Ambulation/Gait assistance: Supervision Ambulation Distance (Feet): 150 Feet Assistive device: None Gait Pattern/deviations: Step-through pattern;Decreased stride length;Scissoring;Trunk flexed Gait velocity: decreased Gait velocity interpretation: Below normal speed for age/gender General Gait Details: VC for upright posture and awareness of instability with higher level dynamic gait tasks. Strays away from straight path with high marching and variable (slow) gait challenges. Minimal difficulty with quick gait speeds, vertical/horizontal head turns, and backwards stepping. No overt loss of balance noted.  Stairs            Wheelchair Mobility    Modified Rankin (Stroke Patients Only)       Balance Overall balance assessment: Needs assistance Sitting-balance support: No upper extremity supported;Feet supported Sitting balance-Leahy Scale: Normal     Standing balance support: No upper extremity supported Standing balance-Leahy Scale: Good                               Pertinent Vitals/Pain Pain Assessment: No/denies pain    Home Living Family/patient expects to be discharged to:: Assisted living Living Arrangements: Alone             Home Equipment: None      Prior Function Level of Independence: Needs assistance   Gait / Transfers Assistance Needed: Pt states that he ambulates at ALF w/o AD, however, he is notwed to reach for objects to steady himeself when walking in room. No family present during assessment this morning.  ADL's / Homemaking Assistance Needed: Pt lives at [facility], he reports that meals and homemaking is done for him. He states that he dresses  himself and does toileting on his own as well. No family present during assessment.  Comments: Pt is from ALF and plans to d/c back there.      Hand Dominance   Dominant Hand: Right    Extremity/Trunk Assessment   Upper Extremity Assessment: Defer to OT  evaluation           Lower Extremity Assessment: Generalized weakness      Cervical / Trunk Assessment: Normal  Communication   Communication: Expressive difficulties;HOH  Cognition Arousal/Alertness: Awake/alert Behavior During Therapy: WFL for tasks assessed/performed Overall Cognitive Status: No family/caregiver present to determine baseline cognitive functioning (Unaware of correct year.)                      General Comments      Exercises        Assessment/Plan    PT Assessment Patient needs continued PT services  PT Diagnosis Abnormality of gait;Generalized weakness   PT Problem List Decreased strength;Decreased activity tolerance;Decreased balance;Decreased mobility;Decreased cognition;Decreased knowledge of use of DME  PT Treatment Interventions DME instruction;Gait training;Functional mobility training;Therapeutic activities;Therapeutic exercise;Balance training;Cognitive remediation;Patient/family education   PT Goals (Current goals can be found in the Care Plan section) Acute Rehab PT Goals Patient Stated Goal: Feel better PT Goal Formulation: With patient Time For Goal Achievement: 09/10/15 Potential to Achieve Goals: Good    Frequency Min 3X/week   Barriers to discharge        Co-evaluation               End of Session   Activity Tolerance: Patient tolerated treatment well Patient left: in bed;with call bell/phone within reach;with bed alarm set;with SCD's reapplied Nurse Communication: Mobility status         Time: [id number] PT Time Calculation (min) (ACUTE ONLY): 12 min   Charges:   PT Evaluation $PT Eval Low Complexity: 1 Procedure     PT G CodesEllouise Newer 08/27/2015, 12:11 PM  Elayne Snare, [place]

## 2015-08-27 NOTE — Evaluation (Signed)
Occupational Therapy Evaluation Patient Details Name: Logan Day MRN: [id number] DOB: 12/14/1932 Today's Date: 08/27/2015    History of Present Illness Logan Day is a 82 y.o. male with history of atrial fibrillation on Xarelto, stroke, dementia. Presents for complaints of GI bleeding. Patient is unable to provide any history, but per chart review he was noted to have some bloody diarrhea at his assisted living facility for the past several days. Also some noted weakness and fatigue. Patient denies abdominal pain. No reported hematemesis. He had colonoscopy in 2009 which showed universal diverticulosis otherwise unrevealing. Pt admitted secondary to above w/ dx: GI bleed.   Clinical Impression   Pt admitted 2* GI bleed, & was seen for ADL retraining session for toileting, functional transfers and bathing/grooming at sink level. His is overall supervision-min assist. He benefits from increased time for tasks and to express himself as per his h/o expressive aphaisa. Discussion with RN revelas that pt is most likely at baseline level w/ plans to d/c back to ALF. No family present during assessment. Will sign off acute OT at this time.    Follow Up Recommendations  No OT follow up;Supervision/Assistance - 24 hour (Pt plans to d/c back to ALF w/ PRN assist)    Equipment Recommendations  None recommended by OT    Recommendations for Other Services       Precautions / Restrictions Precautions Precautions: Fall;Other (comment) Precaution Comments: DNR Restrictions Weight Bearing Restrictions: No      Mobility Bed Mobility Overal bed mobility: Modified Independent             General bed mobility comments: Increased time and 1 step commands secondary to aphasia  Transfers Overall transfer level: Needs assistance Equipment used: None Transfers: Sit to/from Stand;Stand Pivot Transfers Sit to Stand: Supervision Stand pivot transfers: Min guard       General  transfer comment: Increased time and 1 step commands for tasks secondary to aphasia    Balance Overall balance assessment: Needs assistance Sitting-balance support: No upper extremity supported;Feet supported Sitting balance-Leahy Scale: Good     Standing balance support: No upper extremity supported Standing balance-Leahy Scale: Fair                              ADL Overall ADL's : Needs assistance/impaired;At baseline     Grooming: Wash/dry hands;Wash/dry face;Standing;Set up;Minimal assistance   Upper Body Bathing: Set up;Minimal assitance;Sitting   Lower Body Bathing: Set up;Minimal assistance;Sit to/from stand;Cueing for safety;Cueing for sequencing   Upper Body Dressing : Modified independent;Sitting   Lower Body Dressing: Modified independent;Sitting/lateral leans;Sit to/from stand   Toilet Transfer: Supervision/safety;Ambulation;BSC   Toileting- Clothing Manipulation and Hygiene: Supervision/safety;Sitting/lateral lean;Sit to/from stand;Min guard Toileting - Clothing Manipulation Details (indicate cue type and reason):  (Pt has BM on 3:1 and was noted to get BM on hands after cleaning. He was supervision to wash hands and clean peri /anal area)     Functional mobility during ADLs: Supervision/safety;Min guard (Min guard w/o AD, pt will benefit from PT assessment for recommendation on possible AD) General ADL Comments: Pt was seen for ADL retraining session for toileting, functional transfers and bathing/grooming at sink level. He benefits from increased time for tasks and to express himself as per his h/o expressive aphaisa. Discussion with RN revelas that pt is most likely at baseline level w/ plans to d/c back to ALF. No family present during assessment. Will sign off acute OT  at this time.     Vision Vision Assessment?: No apparent visual deficits   Perception     Praxis      Pertinent Vitals/Pain Pain Assessment: No/denies pain     Hand  Dominance Right   Extremity/Trunk Assessment Upper Extremity Assessment Upper Extremity Assessment: Overall WFL for tasks assessed (L UE overall 3/5 vs R UE 4/5 )   Lower Extremity Assessment Lower Extremity Assessment: Defer to PT evaluation   Cervical / Trunk Assessment Cervical / Trunk Assessment: Normal   Communication Communication Communication: Expressive difficulties;HOH   Cognition Arousal/Alertness: Awake/alert Behavior During Therapy: WFL for tasks assessed/performed Overall Cognitive Status: History of cognitive impairments - at baseline (Pt with h/o dementia as well as expressive aphasia 2* previous CVA)                     General Comments       Exercises       Shoulder Instructions      Home Living Family/patient expects to be discharged to:: Assisted living                                        Prior Functioning/Environment Level of Independence: Needs assistance  Gait / Transfers Assistance Needed: Pt states that he ambulates at ALF w/o AD, however, he is notwed to reach for objects to steady himeself when walking in room. No family present during assessment this morning. ADL's / Homemaking Assistance Needed: Pt lives at [facility], he reports that meals and homemaking is done for him. He states that he dresses himself and does toileting on his own as well. No family present during assessment. Communication / Swallowing Assistance Needed: Pt has had previous CVA w/ expressive aphasia and dementia. Given increased time & occasional vc's, pt appears to get out what he is trying to say. Comments: Pt is from ALF and plans to d/c back there.     OT Diagnosis: Generalized weakness   OT Problem List:     OT Treatment/Interventions:      OT Goals(Current goals can be found in the care plan section) Acute Rehab OT Goals Patient Stated Goal: Feel better OT Goal Formulation: All assessment and education complete, DC therapy  OT Frequency:      Barriers to D/C:            Co-evaluation              End of Session Equipment Utilized During Treatment: Gait belt Nurse Communication: Mobility status;Other (comment) (At baseline level for OT)  Activity Tolerance: Patient tolerated treatment well Patient left: in bed;with call bell/phone within reach;with bed alarm set   Time: [id number] OT Time Calculation (min): 28 min Charges:  OT General Charges $OT Visit: 1 Procedure OT Evaluation $OT Eval High Complexity: 1 Procedure G-Codes:    Almyra Deforest, OTR/L 08/27/2015, 10:28 AM

## 2015-08-27 NOTE — Progress Notes (Signed)
PATIENT DETAILS Name: Logan Day Age: 82 y.o. Sex: male Date of Birth: 03-30-1933 Admit Date: 08/26/2015 Admitting Physician Waldemar Dickens, MD PCP:No primary care provider on file.  Subjective: Pleasantly confused-but able to answer some questions appropriately.Per RN-brown stools this morning. No further bleeding noted overnight.  Assessment/Plan: Active Problems: Suspected lower GI bleeding: Likely diverticular-in a setting of anticoagulation use, seems to have resolved. Per RN-brown stools this morning. Hemoglobin stable after PRBC transfusion. GI following-no plans for endoscopic evaluation for now. Hopefully after stopping anticoagulation, bleeding well not reoccur. Do not plan on restarting anticoagulation on discharge.  Acute blood loss anemia: Secondary to above, hemoglobin stable posttransfusion. Follow.  Paroxysmal atrial fibrillation: Sinus rhythm in telemetry, continue amiodarone and metoprolol.CHA2DS2-VASc = 7. Spoke with patient's son-[name] Elsmore over the phone-long discussion regarding pros vs cons of continuing long-term anticoagulation. Family is aware of embolic risk-including catastrophic CVA in the event of stopping anticoagulation. After much discussion, we have decided to stop long-term anticoagulation, will plan on starting aspirin once stable. We will try to get in touch with patient's primary cardiologist.  CKD stage III: Creatinine close to usual baseline. Follow periodically.  Chronic systolic heart failure: Compensated, EF by TTE on April 2016 around 40-45%. Continue metoprolol.  GERD: Continue PPI  Dyslipidemia: Continue statin  Insomnia: Continue trazodone  Depression with anxiety: Appears stable-continue Xanax, Wellbutrin and Zoloft.  Dementia: Pleasantly confused, nonfocal exam-answer some questions appropriately. CT head without any acute abnormalities. Suspect current mental status close to baseline.  Follow.  Protein-calorie malnutrition, severe: Continue supplements.  Physical deconditioning: PT evaluation-home health physical therapy recommended.  Disposition: Remain inpatient-back to ALF with home health PT on discharge.  Antimicrobial agents  See below  Anti-infectives    None      DVT Prophylaxis: SCD's  Code Status: DNR  Family Communication Ashad Imel over the phone (Son)  Procedures: None  CONSULTS:  GI  Time spent 30 minutes-Greater than 50% of this time was spent in counseling, explanation of diagnosis, planning of further management, and coordination of care.  MEDICATIONS: Scheduled Meds: . sodium chloride   Intravenous Once  . amiodarone  200 mg Oral Daily  . buPROPion  150 mg Oral BID  . feeding supplement  1 Container Oral TID BM  . metoprolol  50 mg Oral BID  . pantoprazole (PROTONIX) IV  80 mg Intravenous Once  . [START ON 08/30/2015] pantoprazole (PROTONIX) IV  40 mg Intravenous Q12H  . pravastatin  40 mg Oral QPM  . sertraline  100 mg Oral QPM  . sodium chloride flush  3 mL Intravenous Q12H  . traZODone  50 mg Oral QHS   Continuous Infusions: . sodium chloride 50 mL/hr at 08/27/15 0330  . pantoprozole (PROTONIX) infusion 8 mg/hr (08/27/15 0630)   PRN Meds:.acetaminophen **OR** acetaminophen, ALPRAZolam, ondansetron **OR** ondansetron (ZOFRAN) IV    PHYSICAL EXAM: Vital signs in last 24 hours: Filed Vitals:   08/27/15 0604 08/27/15 0945 08/27/15 1200 08/27/15 1306  BP: 119/38 127/56  122/54  Pulse: 46 50  45  Temp: 98.1 F (36.7 C)   98.1 F (36.7 C)  TempSrc: Oral   Oral  Resp: 18   20  Height:   5\' 11"  (1.803 m)   Weight:   71.3 kg (157 lb 3 oz)   SpO2: 91%   98%    Weight change:  Filed Weights   08/26/15 2135 08/27/15 1200  Weight: 71.3 kg (157 lb 3 oz) 71.3 kg (157 lb 3 oz)   Body mass index is 21.93 kg/(m^2).   Gen Exam: Awake-pleasantly confused. Clear speech.  Neck: Supple, No JVD.   Chest: B/L Clear.    CVS: S1 S2 Regular, no murmurs.  Abdomen: soft, BS +, non tender, non distended.  Extremities: no edema, lower extremities warm to touch. Neurologic: Non Focal.   Skin: No Rash.   Wounds: N/A.    Intake/Output from previous day:  Intake/Output Summary (Last 24 hours) at 08/27/15 1420 Last data filed at 08/27/15 1402  Gross per 24 hour  Intake 1186.16 ml  Output   1975 ml  Net -788.84 ml     LAB RESULTS: CBC  Recent Labs Lab 08/26/15 1455 08/26/15 1524 08/27/15 0558  WBC 5.8  --  5.8  HGB 6.5* 7.8* 8.0*  HCT 20.1* 23.0* 24.3*  PLT 219  --  174  MCV 87.4  --  84.4  MCH 28.3  --  27.8  MCHC 32.3  --  32.9  RDW 15.6*  --  16.0*  LYMPHSABS 0.8  --   --   MONOABS 0.4  --   --   EOSABS 0.3  --   --   BASOSABS 0.0  --   --     Chemistries   Recent Labs Lab 08/26/15 1455 08/26/15 1524 08/27/15 0558  NA 139 141 139  K 4.2 4.2 4.0  CL 107 106 109  CO2 22  --  24  GLUCOSE 86 84 80  BUN 24* 28* 20  CREATININE 1.74* 1.60* 1.67*  CALCIUM 8.4*  --  8.1*    CBG: No results for input(s): GLUCAP in the last 168 hours.  GFR Estimated Creatinine Clearance: 35.6 mL/min (by C-G formula based on Cr of 1.67).  Coagulation profile  Recent Labs Lab 08/26/15 1455  INR 3.20*    Cardiac Enzymes No results for input(s): CKMB, TROPONINI, MYOGLOBIN in the last 168 hours.  Invalid input(s): CK  Invalid input(s): POCBNP No results for input(s): DDIMER in the last 72 hours. No results for input(s): HGBA1C in the last 72 hours. No results for input(s): CHOL, HDL, LDLCALC, TRIG, CHOLHDL, LDLDIRECT in the last 72 hours. No results for input(s): TSH, T4TOTAL, T3FREE, THYROIDAB in the last 72 hours.  Invalid input(s): FREET3 No results for input(s): VITAMINB12, FOLATE, FERRITIN, TIBC, IRON, RETICCTPCT in the last 72 hours.  Recent Labs  08/26/15 1455  LIPASE 25    Urine Studies No results for input(s): UHGB, CRYS in the last 72 hours.  Invalid input(s): UACOL,  UAPR, USPG, UPH, UTP, UGL, UKET, UBIL, UNIT, UROB, ULEU, UEPI, UWBC, URBC, UBAC, CAST, UCOM, BILUA  MICROBIOLOGY: Recent Results (from the past 240 hour(s))  Urine culture     Status: None (Preliminary result)   Collection Time: 08/26/15  3:36 PM  Result Value Ref Range Status   Specimen Description URINE, CLEAN CATCH  Final   Special Requests NONE  Final   Culture NO GROWTH < 24 HOURS  Final   Report Status PENDING  Incomplete  MRSA PCR Screening     Status: None   Collection Time: 08/26/15 10:12 PM  Result Value Ref Range Status   MRSA by PCR NEGATIVE NEGATIVE Final    Comment:        The GeneXpert MRSA Assay (FDA approved for NASAL specimens only), is one component of a comprehensive MRSA colonization surveillance program. It is not intended to diagnose MRSA infection  nor to guide or monitor treatment for MRSA infections.     RADIOLOGY STUDIES/RESULTS: Dg Chest 2 View  08/26/2015  CLINICAL DATA:  Headache, prostate cancer, pale EXAM: CHEST  2 VIEW COMPARISON:  05/19/2015 FINDINGS: Cardiomediastinal silhouette is stable. No acute infiltrate or pleural effusion. No pulmonary edema. Osteopenia and mild degenerative changes thoracic spine. Hyperinflation again noted. IMPRESSION: No active disease. Hyperinflation again noted. Osteopenia and mild degenerative changes thoracic spine. Electronically Signed   By: Lahoma Crocker M.D.   On: 08/26/2015 14:50   Dg Abd 1 View  08/26/2015  CLINICAL DATA:  82 year old male with constipation. EXAM: ABDOMEN - 1 VIEW COMPARISON:  None. FINDINGS: The bowel gas pattern is unremarkable. There is no evidence of bowel obstruction. A small amount of stool in the colon and rectum noted. Surgical clips in the pelvis are present. IMPRESSION: Unremarkable bowel gas pattern. Electronically Signed   By: Margarette Canada M.D.   On: 08/26/2015 18:48   Ct Head Wo Contrast  08/26/2015  CLINICAL DATA:  Altered mental status and headache today. Initial encounter. EXAM: CT  HEAD WITHOUT CONTRAST TECHNIQUE: Contiguous axial images were obtained from the base of the skull through the vertex without intravenous contrast. COMPARISON:  Brain MRI 07/16/2011.  Head CT scan 07/15/2011. FINDINGS: The brain is atrophic with extensive chronic microvascular ischemic change. Remote bilateral posterior temporal and parietal infarcts are identified. No evidence of acute infarction, hemorrhage, mass lesion, midline shift or abnormal extra-axial fluid collection is seen. No pneumocephalus or hydrocephalus. The calvarium is intact. Mucous retention cysts or polyps right maxillary sinus noted. Mild ethmoid air cell disease is seen on the right. IMPRESSION: No acute abnormality. Remote bilateral parietotemporal infarcts. Atrophy and chronic microvascular ischemic change. Electronically Signed   By: Inge Rise M.D.   On: 08/26/2015 16:24    Oren Binet, MD  [facility] Pager:[phone]  If 7PM-7AM, please contact night-coverage [email or web address] Password [email or web address] 08/27/2015, 2:20 PM   LOS: 1 day

## 2015-08-27 NOTE — Consult Note (Signed)
[facility] Gastroenterology Consultation Note  Referring Provider: No ref. provider found Primary Care Physician:  No primary care provider on file. Primary Gastroenterologist:  Dr. Earle Gell  Reason for Consultation:  Blood in stool  HPI: Logan Day is a 82 y.o. male with history of atrial fibrillation on Xarelto, stroke, dementia.  Presents for complaints of GI bleeding.  Patient is unable to provide any history, but per chart review he was noted to have some bloody diarrhea at his assisted living facility for the past several days.  Also some noted weakness and fatigue.  Patient denies abdominal pain.  No reported hematemesis.  He had colonoscopy in 2009 which showed universal diverticulosis otherwise unrevealing.  No reported bleeding since hospital admission.   Past Medical History  Diagnosis Date  . Hypertension   . Atrial fibrillation ([place])   . Hypercholesteremia   . MI (myocardial infarction) ([place])     x 2  . Angina   . Cancer [facility])     prostate cancer  . Coronary artery disease     Circ stent (2009 cath patent)  . Prostate cancer ([place])   . Hyperlipidemia   . TIA (transient ischemic attack)   . GERD (gastroesophageal reflux disease)   . Shortness of breath dyspnea   . COPD (chronic obstructive pulmonary disease) ([place])   . Dementia   . Congestive heart failure [facility])     Past Surgical History  Procedure Laterality Date  . Prostayectomy    . Fracture surgery    . Coronary angioplasty with stent placement  10/19/2012    DES  to circumflex  . Percutaneous coronary stent intervention (pci-s) N/A 10/19/2012    Procedure: PERCUTANEOUS CORONARY STENT INTERVENTION (PCI-S);  Surgeon: Sinclair Grooms, MD;  Location: [facility] CATH LAB;  Service: Cardiovascular;  Laterality: N/A;    Prior to Admission medications   Medication Sig Start Date End Date Taking? Authorizing Provider  ALPRAZolam Duanne Moron) 0.5 MG tablet Take 1 tablet (0.5 mg total) by mouth 2 (two) times daily as needed  for anxiety. 05/29/15  Yes Pleas Koch, NP  amiodarone (PACERONE) 200 MG tablet Take 1 tablet (200 mg total) by mouth daily. 01/22/15  Yes Belva Crome, MD  aspirin EC 81 MG tablet Take 1 tablet (81 mg total) by mouth daily. 12/18/13  Yes Belva Crome, MD  buPROPion [facility] SR) 150 MG 12 hr tablet Take 150 mg by mouth 2 (two) times daily.  08/01/14  Yes Historical Provider, MD  feeding supplement, ENSURE ENLIVE, (ENSURE ENLIVE) LIQD Take 237 mLs by mouth 2 (two) times daily after a meal. Patient taking differently: Take 237 mLs by mouth 2 (two) times daily between meals.  12/04/14  Yes Modena Jansky, MD  metoprolol (LOPRESSOR) 50 MG tablet Take 50 mg by mouth 2 (two) times daily. 04/23/15  Yes Historical Provider, MD  Multiple Vitamin (MULTIVITAMIN WITH MINERALS) TABS tablet Take 1 tablet by mouth every evening.   Yes Historical Provider, MD  omeprazole (PRILOSEC) 20 MG capsule Take 20 mg by mouth every morning.   Yes Historical Provider, MD  pravastatin (PRAVACHOL) 40 MG tablet Take 40 mg by mouth every evening.   Yes Historical Provider, MD  Rivaroxaban (XARELTO) 15 MG TABS tablet Take 15 mg by mouth 2 (two) times daily with a meal.    Yes Historical Provider, MD  sertraline (ZOLOFT) 100 MG tablet Take 100 mg by mouth every evening.   Yes Historical Provider, MD  traZODone (DESYREL) 50  MG tablet Take 50 mg by mouth at bedtime.   Yes Historical Provider, MD  albuterol (PROAIR HFA) 108 (90 BASE) MCG/ACT inhaler Inhale 1-2 puffs into the lungs every 6 (six) hours as needed for wheezing or shortness of breath. 05/06/15   Pleas Koch, NP  bisacodyl (DULCOLAX) 5 MG EC tablet Take 5 mg by mouth daily as needed for moderate constipation.    Historical Provider, MD  nitroGLYCERIN (NITROSTAT) 0.4 MG SL tablet Place 0.4 mg under the tongue every 5 (five) minutes as needed for chest pain.    Historical Provider, MD  sulfamethoxazole-trimethoprim (BACTRIM DS,SEPTRA DS) 800-160 MG tablet Take 1  tablet by mouth 2 (two) times daily. Patient not taking: Reported on 08/26/2015 06/10/15   Pleas Koch, NP    Current Facility-Administered Medications  Medication Dose Route Frequency Provider Last Rate Last Dose  . 0.45 % sodium chloride infusion   Intravenous Continuous Waldemar Dickens, MD 50 mL/hr at 08/27/15 0330    . 0.9 %  sodium chloride infusion   Intravenous Once Gardiner Barefoot, NP      . acetaminophen (TYLENOL) tablet 650 mg  650 mg Oral Q6H PRN Waldemar Dickens, MD       Or  . acetaminophen (TYLENOL) suppository 650 mg  650 mg Rectal Q6H PRN Waldemar Dickens, MD      . ALPRAZolam Duanne Moron) tablet 0.5 mg  0.5 mg Oral BID PRN Waldemar Dickens, MD      . amiodarone (PACERONE) tablet 200 mg  200 mg Oral Daily Waldemar Dickens, MD      . buPROPion [facility] SR) 12 hr tablet 150 mg  150 mg Oral BID Waldemar Dickens, MD   150 mg at 08/26/15 2216  . feeding supplement (ENSURE ENLIVE) (ENSURE ENLIVE) liquid 237 mL  237 mL Oral BID BM Waldemar Dickens, MD      . metoprolol tartrate (LOPRESSOR) tablet 50 mg  50 mg Oral BID Waldemar Dickens, MD   50 mg at 08/26/15 2206  . ondansetron (ZOFRAN) tablet 4 mg  4 mg Oral Q6H PRN Waldemar Dickens, MD       Or  . ondansetron [facility]) injection 4 mg  4 mg Intravenous Q6H PRN Waldemar Dickens, MD      . pantoprazole (PROTONIX) 80 mg in sodium chloride 0.9 % 100 mL IVPB  80 mg Intravenous Once Waldemar Dickens, MD   80 mg at 08/26/15 1812  . pantoprazole (PROTONIX) 80 mg in sodium chloride 0.9 % 250 mL (0.32 mg/mL) infusion  8 mg/hr Intravenous Continuous Waldemar Dickens, MD 25 mL/hr at 08/27/15 0630 8 mg/hr at 08/27/15 0630  . [START ON 08/30/2015] pantoprazole (PROTONIX) injection 40 mg  40 mg Intravenous Q12H Waldemar Dickens, MD      . pravastatin (PRAVACHOL) tablet 40 mg  40 mg Oral QPM Waldemar Dickens, MD      . sertraline (ZOLOFT) tablet 100 mg  100 mg Oral QPM Waldemar Dickens, MD      . sodium chloride flush (NS) 0.9 % injection 3 mL  3 mL  Intravenous Q12H Waldemar Dickens, MD   3 mL at 08/26/15 2200  . traZODone (DESYREL) tablet 50 mg  50 mg Oral QHS Waldemar Dickens, MD   50 mg at 08/26/15 2206    Allergies as of 08/26/2015  . (No Known Allergies)    Family History  Problem Relation Age of Onset  .  Coronary artery disease Son   . Heart disease Mother   . Heart disease Father     Social History   Social History  . Marital Status: Widowed    Spouse Name: N/A  . Number of Children: N/A  . Years of Education: N/A   Occupational History  . Not on file.   Social History Main Topics  . Smoking status: Former Smoker -- 1.00 packs/day for 40 years  . Smokeless tobacco: Former Systems developer    Types: Chew     Comment: quit smoking 2002  . Alcohol Use: No  . Drug Use: No  . Sexual Activity: No   Other Topics Concern  . Not on file   Social History Narrative   Widower.   Retired. Worked for [employer].   Working outdoors.       Review of Systems: Unable to obtain due to patient's chronic confusion/dementia  Physical Exam: Vital signs in last 24 hours: Temp:  [97.3 F (36.3 C)-98.6 F (37 C)] 98.1 F (36.7 C) (01/25 0604) Pulse Rate:  [46-120] 46 (01/25 0604) Resp:  [11-20] 18 (01/25 0604) BP: (112-152)/(38-97) 119/38 mmHg (01/25 0604) SpO2:  [90 %-100 %] 91 % (01/25 0604) Weight:  [71.3 kg (157 lb 3 oz)] 71.3 kg (157 lb 3 oz) (01/24 2135) Last BM Date: 08/26/15 General:   Alert, can answer some questions appropriately, most questions inappropriately, NAD Head:  Normocephalic and atraumatic. Eyes:  Sclera clear, no icterus.   Conjunctiva pale-appearing Ears:  Normal auditory acuity. Nose:  No deformity, discharge,  or lesions. Mouth:  No deformity or lesions.  Oropharynx dry and somewhat pale-appearing Neck:  Supple; no masses or thyromegaly. Lungs:  Clear throughout to auscultation.   No wheezes, crackles, or rhonchi. No acute distress. Heart:  Irregular, no murmurs, clicks, rubs,  or gallops. Abdomen:   Soft, nontender and nondistended. No masses, hepatosplenomegaly or hernias noted. Normal bowel sounds, without guarding, and without rebound.     Msk:  Symmetrical extremity atrophy without gross deformities. Normal posture. Pulses:  Normal pulses noted. Extremities:  Without clubbing or edema. Neurologic:  Alert but not oriented to time or situation; diffusely weak. Skin:  Pale, scattered ecchymoses, otherwise intact without significant lesions or rashes. Psych:  Alert but not oriented to time or situation. Normal mood and affect.   Lab Results:  Recent Labs  08/26/15 1455 08/26/15 1524 08/27/15 0558  WBC 5.8  --  5.8  HGB 6.5* 7.8* 8.0*  HCT 20.1* 23.0* 24.3*  PLT 219  --  174   BMET  Recent Labs  08/26/15 1455 08/26/15 1524 08/27/15 0558  NA 139 141 139  K 4.2 4.2 4.0  CL 107 106 109  CO2 22  --  24  GLUCOSE 86 84 80  BUN 24* 28* 20  CREATININE 1.74* 1.60* 1.67*  CALCIUM 8.4*  --  8.1*   LFT  Recent Labs  08/26/15 1455  PROT 6.5  ALBUMIN 3.0*  AST 28  ALT 17  ALKPHOS 55  BILITOT 0.6   PT/INR  Recent Labs  08/26/15 1455  LABPROT 32.1*  INR 3.20*    Studies/Results: Dg Chest 2 View  08/26/2015  CLINICAL DATA:  Headache, prostate cancer, pale EXAM: CHEST  2 VIEW COMPARISON:  05/19/2015 FINDINGS: Cardiomediastinal silhouette is stable. No acute infiltrate or pleural effusion. No pulmonary edema. Osteopenia and mild degenerative changes thoracic spine. Hyperinflation again noted. IMPRESSION: No active disease. Hyperinflation again noted. Osteopenia and mild degenerative changes thoracic spine.  Electronically Signed   By: Lahoma Crocker M.D.   On: 08/26/2015 14:50   Dg Abd 1 View  08/26/2015  CLINICAL DATA:  82 year old male with constipation. EXAM: ABDOMEN - 1 VIEW COMPARISON:  None. FINDINGS: The bowel gas pattern is unremarkable. There is no evidence of bowel obstruction. A small amount of stool in the colon and rectum noted. Surgical clips in the pelvis  are present. IMPRESSION: Unremarkable bowel gas pattern. Electronically Signed   By: Margarette Canada M.D.   On: 08/26/2015 18:48   Ct Head Wo Contrast  08/26/2015  CLINICAL DATA:  Altered mental status and headache today. Initial encounter. EXAM: CT HEAD WITHOUT CONTRAST TECHNIQUE: Contiguous axial images were obtained from the base of the skull through the vertex without intravenous contrast. COMPARISON:  Brain MRI 07/16/2011.  Head CT scan 07/15/2011. FINDINGS: The brain is atrophic with extensive chronic microvascular ischemic change. Remote bilateral posterior temporal and parietal infarcts are identified. No evidence of acute infarction, hemorrhage, mass lesion, midline shift or abnormal extra-axial fluid collection is seen. No pneumocephalus or hydrocephalus. The calvarium is intact. Mucous retention cysts or polyps right maxillary sinus noted. Mild ethmoid air cell disease is seen on the right. IMPRESSION: No acute abnormality. Remote bilateral parietotemporal infarcts. Atrophy and chronic microvascular ischemic change. Electronically Signed   By: Inge Rise M.D.   On: 08/26/2015 16:24    Impression:  1.  Blood in stool per report; patient is not able to provide cogent history.  No rampant GI bleeding reported since his admission. 2.  Anemia, likely acute (GI bleed) and chronic components. 3.  Coagulopathy, likely contributing to #1 and #2 above. 4.  History of stroke and dementia.  Plan:  1.  Correct coagulopathy. 2.  Are benefits of anticoagulation upon discharge worth the risks?  Certainly if patient is not on blood thinners his risk of GI bleeding will be less. 3.  Follow serial CBCs and transfuse as needed. 4.  Would not pursue endoscopy or colonoscopy at this time, or into the future, in absence of destabilizing GI bleeding otherwise unable to be localized and treated with radiographic means (i.e., tagged RBC study +/- embolization). 5.  If patient rebleeds once his INR is </= 1.8,  would consider tagged RBC as next step in management. 6.  Clear liquid diet today. 7.  [facility] will follow.   LOS: 1 day   Landry Dyke  08/27/2015, 8:33 AM  Pager [phone] If no answer or after 5 PM call [phone]

## 2015-08-28 LAB — PROTIME-INR
INR: 1.31 (ref 0.00–1.49)
Prothrombin Time: 16.4 seconds — ABNORMAL HIGH (ref 11.6–15.2)

## 2015-08-28 LAB — CBC
HCT: 24.4 % — ABNORMAL LOW (ref 39.0–52.0)
Hemoglobin: 8 g/dL — ABNORMAL LOW (ref 13.0–17.0)
MCH: 27.9 pg (ref 26.0–34.0)
MCHC: 32.8 g/dL (ref 30.0–36.0)
MCV: 85 fL (ref 78.0–100.0)
PLATELETS: 193 10*3/uL (ref 150–400)
RBC: 2.87 MIL/uL — ABNORMAL LOW (ref 4.22–5.81)
RDW: 16.2 % — AB (ref 11.5–15.5)
WBC: 6.3 10*3/uL (ref 4.0–10.5)

## 2015-08-28 LAB — TYPE AND SCREEN
ABO/RH(D): A POS
ANTIBODY SCREEN: NEGATIVE
UNIT DIVISION: 0
UNIT DIVISION: 0

## 2015-08-28 MED ORDER — ASPIRIN EC 81 MG PO TBEC: 81.0000 mg | DELAYED_RELEASE_TABLET | Freq: Every day | ORAL | Status: DC

## 2015-08-28 MED ORDER — FERROUS SULFATE 325 (65 FE) MG PO TABS: 325.0000 mg | ORAL_TABLET | Freq: Two times a day (BID) | ORAL | Status: DC

## 2015-08-28 MED ORDER — PANTOPRAZOLE SODIUM 40 MG PO TBEC
40.0000 mg | DELAYED_RELEASE_TABLET | Freq: Every day | ORAL | Status: DC
  Administered 2015-08-28: 40 mg via ORAL
  Filled 2015-08-28: qty 1

## 2015-08-28 MED ORDER — ALPRAZOLAM 0.5 MG PO TABS: 0.5000 mg | ORAL_TABLET | Freq: Two times a day (BID) | ORAL | Status: DC | PRN

## 2015-08-28 MED ORDER — BOOST / RESOURCE BREEZE PO LIQD: 1.0000 | Freq: Three times a day (TID) | ORAL | Status: DC

## 2015-08-28 MED ORDER — PANTOPRAZOLE SODIUM 40 MG PO TBEC: 40.0000 mg | DELAYED_RELEASE_TABLET | Freq: Every day | ORAL | Status: DC

## 2015-08-28 NOTE — Progress Notes (Signed)
Remains pleasantly confused-"when can i go home" No bowel movement overnight (no bleeding for at least 2 days) Advanced diet Hemoglobin stable at 8 No further recommendations from GI-suspect stable for discharge. See d/c summary for details.

## 2015-08-28 NOTE — Progress Notes (Signed)
Pt sitting in bed, NAD, alert but somewhat inappropriate speech c/w h/o OBS.  Apparently no BM's x 24hrs, and possibly 48hrs.  Spoke w/ nurse--there is certainly no active ongoing bleeding.  On exam, skin is warm, VS ok exc for bradycardia (metoprolol being held), abd nondistended/soft/nontender, radial pulse is full.  Hgb 8.0, unchanged over 24 hrs.  IMPR:  Quiescent GIB, probably lower, probably diverticular, accentuated by coagulopathy (INR 3.2) on adm +/- Xarelto use.  RECOMM:  1.  Agree w/ advancing diet 2.  If this is diverticular bleeding, there is a fair chance of a re-bleed in the next 1-3 days. 3.  Would strongly re-assess appropriateness of resuming anticoagulation in light of this bleeding event. 4.  I will sign off at this time; if pt has persistent/destabilizing active re-bleeding episode on this admission, could consider nucl med bleeding scan, following by IR embolization if strongly +.   Please call us if questions, or if we can be of further assistance in this patient's care.  Cleotis Nipper, M.D. Pager [phone] If no answer or after 5 PM call [phone]

## 2015-08-28 NOTE — Discharge Summary (Signed)
PATIENT DETAILS Name: Logan Day Age: 82 y.o. Sex: male Date of Birth: [birth year] MRN: [id number]. Admitting Physician: Waldemar Dickens, MD PCP:No primary care provider on file.  Admit Date: 08/26/2015 Discharge date: 08/28/2015  Recommendations for Outpatient Follow-up:  1. Note: Discontinued Xarelto 2. Start ASA in 2 weeks 3. Please repeat CBC in 2 weeks  PRIMARY DISCHARGE DIAGNOSIS:  Active Problems:   Essential hypertension   Hyperlipidemia   PAF (paroxysmal atrial fibrillation) (HCC)   GI bleed   Symptomatic anemia   CKD (chronic kidney disease), stage II   Physical deconditioning   Protein calorie malnutrition (HCC)   Protein-calorie malnutrition, severe      PAST MEDICAL HISTORY: Past Medical History  Diagnosis Date  . Hypertension   . Atrial fibrillation ([place])   . Hypercholesteremia   . MI (myocardial infarction) ([place])     x 2  . Angina   . Cancer [facility])     prostate cancer  . Coronary artery disease     Circ stent (2009 cath patent)  . Prostate cancer ([place])   . Hyperlipidemia   . TIA (transient ischemic attack)   . GERD (gastroesophageal reflux disease)   . Shortness of breath dyspnea   . COPD (chronic obstructive pulmonary disease) ([place])   . Dementia   . Congestive heart failure ([place])   . GI bleed 08/2015    DISCHARGE MEDICATIONS: Current Discharge Medication List    START taking these medications   Details  ferrous sulfate 325 (65 FE) MG tablet Take 1 tablet (325 mg total) by mouth 2 (two) times daily with a meal. Qty: 90 tablet, Refills: 0    pantoprazole (PROTONIX) 40 MG tablet Take 1 tablet (40 mg total) by mouth daily at 12 noon. Qty: 30 tablet, Refills: 0      CONTINUE these medications which have CHANGED   Details  ALPRAZolam (XANAX) 0.5 MG tablet Take 1 tablet (0.5 mg total) by mouth 2 (two) times daily as needed for anxiety. Qty: 30 tablet, Refills: 0   Associated Diagnoses: Anxiety state    aspirin EC 81 MG tablet Take  1 tablet (81 mg total) by mouth daily. Resume on 09/11/15   Associated Diagnoses: Atherosclerotic heart disease of native coronary artery with angina pectoris ([place]); Atrial fibrillation (HCC)    feeding supplement (BOOST / RESOURCE BREEZE) LIQD Take 1 Container by mouth 3 (three) times daily between meals. Qty: 90 Container, Refills: 0      CONTINUE these medications which have NOT CHANGED   Details  amiodarone (PACERONE) 200 MG tablet Take 1 tablet (200 mg total) by mouth daily. Qty: 30 tablet, Refills: 5    buPROPion (WELLBUTRIN SR) 150 MG 12 hr tablet Take 150 mg by mouth 2 (two) times daily.     metoprolol (LOPRESSOR) 50 MG tablet Take 50 mg by mouth 2 (two) times daily.    Multiple Vitamin (MULTIVITAMIN WITH MINERALS) TABS tablet Take 1 tablet by mouth every evening.    pravastatin (PRAVACHOL) 40 MG tablet Take 40 mg by mouth every evening.    sertraline (ZOLOFT) 100 MG tablet Take 100 mg by mouth every evening.    traZODone (DESYREL) 50 MG tablet Take 50 mg by mouth at bedtime.    albuterol (PROAIR HFA) 108 (90 BASE) MCG/ACT inhaler Inhale 1-2 puffs into the lungs every 6 (six) hours as needed for wheezing or shortness of breath. Qty: 1 Inhaler, Refills: 3   Associated Diagnoses: Chronic obstructive pulmonary disease, unspecified COPD type (  HCC)    bisacodyl (DULCOLAX) 5 MG EC tablet Take 5 mg by mouth daily as needed for moderate constipation.    nitroGLYCERIN (NITROSTAT) 0.4 MG SL tablet Place 0.4 mg under the tongue every 5 (five) minutes as needed for chest pain.      STOP taking these medications     omeprazole (PRILOSEC) 20 MG capsule      Rivaroxaban (XARELTO) 15 MG TABS tablet      sulfamethoxazole-trimethoprim (BACTRIM DS,SEPTRA DS) 800-160 MG tablet         ALLERGIES:  No Known Allergies  BRIEF HPI:  See H&P, Labs, Consult and Test reports for all details in brief, patient is a 82 year old male with history of dementia-brought in from assisted living for  evaluation of anemia. Patient had hematochezia 1 week prior to this admission.  CONSULTATIONS:   GI  PERTINENT RADIOLOGIC STUDIES: Dg Chest 2 View  08/26/2015  CLINICAL DATA:  Headache, prostate cancer, pale EXAM: CHEST  2 VIEW COMPARISON:  05/19/2015 FINDINGS: Cardiomediastinal silhouette is stable. No acute infiltrate or pleural effusion. No pulmonary edema. Osteopenia and mild degenerative changes thoracic spine. Hyperinflation again noted. IMPRESSION: No active disease. Hyperinflation again noted. Osteopenia and mild degenerative changes thoracic spine. Electronically Signed   By: Lahoma Crocker M.D.   On: 08/26/2015 14:50   Dg Abd 1 View  08/26/2015  CLINICAL DATA:  82 year old male with constipation. EXAM: ABDOMEN - 1 VIEW COMPARISON:  None. FINDINGS: The bowel gas pattern is unremarkable. There is no evidence of bowel obstruction. A small amount of stool in the colon and rectum noted. Surgical clips in the pelvis are present. IMPRESSION: Unremarkable bowel gas pattern. Electronically Signed   By: Margarette Canada M.D.   On: 08/26/2015 18:48   Ct Head Wo Contrast  08/26/2015  CLINICAL DATA:  Altered mental status and headache today. Initial encounter. EXAM: CT HEAD WITHOUT CONTRAST TECHNIQUE: Contiguous axial images were obtained from the base of the skull through the vertex without intravenous contrast. COMPARISON:  Brain MRI 07/16/2011.  Head CT scan 07/15/2011. FINDINGS: The brain is atrophic with extensive chronic microvascular ischemic change. Remote bilateral posterior temporal and parietal infarcts are identified. No evidence of acute infarction, hemorrhage, mass lesion, midline shift or abnormal extra-axial fluid collection is seen. No pneumocephalus or hydrocephalus. The calvarium is intact. Mucous retention cysts or polyps right maxillary sinus noted. Mild ethmoid air cell disease is seen on the right. IMPRESSION: No acute abnormality. Remote bilateral parietotemporal infarcts. Atrophy and  chronic microvascular ischemic change. Electronically Signed   By: Inge Rise M.D.   On: 08/26/2015 16:24     PERTINENT LAB RESULTS: CBC:  Recent Labs  08/27/15 0558 08/28/15 0712  WBC 5.8 6.3  HGB 8.0* 8.0*  HCT 24.3* 24.4*  PLT 174 193   CMET CMP     Component Value Date/Time   NA 139 08/27/2015 0558   K 4.0 08/27/2015 0558   CL 109 08/27/2015 0558   CO2 24 08/27/2015 0558   GLUCOSE 80 08/27/2015 0558   BUN 20 08/27/2015 0558   CREATININE 1.67* 08/27/2015 0558   CALCIUM 8.1* 08/27/2015 0558   PROT 6.5 08/26/2015 1455   ALBUMIN 3.0* 08/26/2015 1455   AST 28 08/26/2015 1455   ALT 17 08/26/2015 1455   ALKPHOS 55 08/26/2015 1455   BILITOT 0.6 08/26/2015 1455   GFRNONAA 37* 08/27/2015 0558   GFRAA 43* 08/27/2015 0558    GFR Estimated Creatinine Clearance: 35.6 mL/min (by C-G formula based on Cr  of 1.67).  Recent Labs  08/26/15 1455  LIPASE 25   No results for input(s): CKTOTAL, CKMB, CKMBINDEX, TROPONINI in the last 72 hours. Invalid input(s): POCBNP No results for input(s): DDIMER in the last 72 hours. No results for input(s): HGBA1C in the last 72 hours. No results for input(s): CHOL, HDL, LDLCALC, TRIG, CHOLHDL, LDLDIRECT in the last 72 hours. No results for input(s): TSH, T4TOTAL, T3FREE, THYROIDAB in the last 72 hours.  Invalid input(s): FREET3 No results for input(s): VITAMINB12, FOLATE, FERRITIN, TIBC, IRON, RETICCTPCT in the last 72 hours. Coags:  Recent Labs  08/26/15 1455 08/28/15 0712  INR 3.20* 1.31   Microbiology: Recent Results (from the past 240 hour(s))  Urine culture     Status: None   Collection Time: 08/26/15  3:36 PM  Result Value Ref Range Status   Specimen Description URINE, CLEAN CATCH  Final   Special Requests NONE  Final   Culture NO GROWTH 1 DAY  Final   Report Status 08/27/2015 FINAL  Final  MRSA PCR Screening     Status: None   Collection Time: 08/26/15 10:12 PM  Result Value Ref Range Status   MRSA by PCR  NEGATIVE NEGATIVE Final    Comment:        The GeneXpert MRSA Assay (FDA approved for NASAL specimens only), is one component of a comprehensive MRSA colonization surveillance program. It is not intended to diagnose MRSA infection nor to guide or monitor treatment for MRSA infections.      BRIEF HOSPITAL COURSE:  Suspected lower GI bleeding: Likely diverticular-in a setting of anticoagulation use, seems to have resolved. No bleeding for at least the past 48 hours. Hemoglobin stable after PRBC transfusion. GI following-no plans for endoscopic evaluation for now. Since no documented hematochezia for the past 48 hours, suspect stable to be discharged back to assisted living facility for further monitoring.  Acute blood loss anemia: Secondary to above, hemoglobin stable posttransfusion. Follow CBC as an outpatient-suggest repeat in 2 weeks. Will start patient on iron supplementation.  Paroxysmal atrial fibrillation: Sinus rhythm in telemetry, continue amiodarone and metoprolol.CHA2DS2-VASc = 7. Spoke with patient's son-[name] Ormond over the phone on 1/26-long discussion regarding pros vs cons of continuing long-term anticoagulation. Family is aware of embolic risk-including catastrophic CVA in the event of stopping anticoagulation. After much discussion, we have decided to stop long-term anticoagulation, will plan on starting aspirin in around 2 weeks. Plan was discussed with patient's primary cardiologist-Dr. Daneen Schick over the phone-who agrees with above.   CKD stage III: Creatinine close to usual baseline. Follow periodically.  Chronic systolic heart failure: Compensated, EF by TTE on April 2016 around 40-45%. Continue metoprolol.  GERD: Continue PPI  Dyslipidemia: Continue statin  Insomnia: Continue trazodone  Depression with anxiety: Appears stable-continue Xanax, Wellbutrin and Zoloft.  Dementia: Pleasantly confused, nonfocal exam-answer some questions appropriately. CT head  without any acute abnormalities. Suspect current mental status close to baseline. Follow.  Protein-calorie malnutrition, severe: Continue supplements.  Physical deconditioning: PT evaluation-home health physical therapy recommended.  DNR  TODAY-DAY OF DISCHARGE:  Subjective:   Logan Day today has no headache,no chest abdominal pain,no new weakness tingling or numbness. No mid trapezius the past 48 hours. He remains pleasantly confused.  Objective:   Blood pressure 126/56, pulse 48, temperature 98.4 F (36.9 C), temperature source Oral, resp. rate 18, height 5\' 11"  (1.803 m), weight 71.3 kg (157 lb 3 oz), SpO2 95 %.  Intake/Output Summary (Last 24 hours) at 08/28/15 1205 Last data  filed at 08/28/15 0950  Gross per 24 hour  Intake 1993.58 ml  Output   1550 ml  Net 443.58 ml   Filed Weights   08/26/15 2135 08/27/15 1200  Weight: 71.3 kg (157 lb 3 oz) 71.3 kg (157 lb 3 oz)    Exam Awake-pleasantly confused-No new F.N deficits, Normal affect Long Neck.AT,PERRAL Supple Neck,No JVD, No cervical lymphadenopathy appriciated.  Symmetrical Chest wall movement, Good air movement bilaterally, CTAB RRR,No Gallops,Rubs or new Murmurs, No Parasternal Heave +ve B.Sounds, Abd Soft, Non tender, No organomegaly appriciated, No rebound -guarding or rigidity. No Cyanosis, Clubbing or edema, No new Rash or bruise  DISCHARGE CONDITION: Stable  DISPOSITION: ALF with PT  DISCHARGE INSTRUCTIONS:    Activity:  As tolerated   Get Medicines reviewed and adjusted: Please take all your medications with you for your next visit with your Primary MD  Please request your Primary MD to go over all hospital tests and procedure/radiological results at the follow up, please ask your Primary MD to get all Hospital records sent to his/her office.  If you experience worsening of your admission symptoms, develop shortness of breath, life threatening emergency, suicidal or homicidal thoughts you must seek  medical attention immediately by calling 911 or calling your MD immediately  if symptoms less severe.  You must read complete instructions/literature along with all the possible adverse reactions/side effects for all the Medicines you take and that have been prescribed to you. Take any new Medicines after you have completely understood and accpet all the possible adverse reactions/side effects.   Do not drive when taking Pain medications.   Do not take more than prescribed Pain, Sleep and Anxiety Medications  Special Instructions: If you have smoked or chewed Tobacco  in the last 2 yrs please stop smoking, stop any regular Alcohol  and or any Recreational drug use.  Wear Seat belts while driving.  Please note  You were cared for by a hospitalist during your hospital stay. Once you are discharged, your primary care physician will handle any further medical issues. Please note that NO REFILLS for any discharge medications will be authorized once you are discharged, as it is imperative that you return to your primary care physician (or establish a relationship with a primary care physician if you do not have one) for your aftercare needs so that they can reassess your need for medications and monitor your lab values.   Diet recommendation: Heart Healthy diet  Discharge Instructions    Call MD for:    Complete by:  As directed   If hematochezia recurs     Diet - low sodium heart healthy    Complete by:  As directed      Increase activity slowly    Complete by:  As directed            Follow-up Information    Follow up with Primary MD. Schedule an appointment as soon as possible for a visit in 1 week.     Total Time spent on discharge equals 45 minutes.  SignedOren Binet 08/28/2015 12:05 PM

## 2015-08-28 NOTE — NC FL2 (Signed)
[place] LEVEL OF CARE SCREENING TOOL     IDENTIFICATION  Patient Name: Logan Day Birthdate: 1932-12-11 Sex: male Admission Date (Current Location): 08/26/2015  [facility] and [place] Number:  Herbalist and Address:  The . [facility], [place] [address] [place], [place] [address]      Provider Number: [id number]  Attending Physician Name and Address:  Jonetta Osgood, MD  Relative Name and Phone Number:  [name], son [phone]    Current Level of Care: Hospital Recommended Level of Care: [facility] Prior Approval Number:    Date Approved/Denied:   PASRR Number:    Discharge Plan: Other (Comment) (ALF)   Current Diagnoses: Patient Active Problem List   Diagnosis Date Noted  . Protein-calorie malnutrition, severe 08/27/2015  . GI bleed 08/26/2015  . Symptomatic anemia 08/26/2015  . CKD (chronic kidney disease), stage II 08/26/2015  . Physical deconditioning 08/26/2015  . Protein calorie malnutrition ([place]) 08/26/2015  . Anticoagulated   . Blood loss anemia   . Constipation   . Bleeding gastrointestinal   . Dementia with behavioral disturbance 05/19/2015  . Severe recurrent major depression without psychotic features ([place]) 05/19/2015  . Incontinence of urine 04/23/2015  . Medicare annual wellness visit, subsequent 04/22/2015  . Anxiety and depression 01/22/2015  . On amiodarone therapy 12/11/2014  . Chronic combined systolic and diastolic congestive heart failure ([place])   . PAF (paroxysmal atrial fibrillation) ([place])   . Malnutrition of moderate degree ([place]) 11/30/2014  . CKD (chronic kidney disease) stage 2, GFR 60-89 ml/min 11/29/2014  . Hyperlipidemia 11/29/2014  . 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 Weight loss, unintentional 11/29/2014  . Hard of hearing 11/29/2014  . Essential hypertension 12/18/2013  . Other and unspecified angina pectoris 10/19/2012    Class: Acute  .  Atherosclerotic heart disease of native coronary artery with angina pectoris ([place]) 10/19/2012  . Aphasia S/P CVA 07/15/2011    Orientation RESPIRATION BLADDER Height & Weight    Self, Time, Situation, Place  Normal Continent   157 lbs.  BEHAVIORAL SYMPTOMS/MOOD NEUROLOGICAL BOWEL NUTRITION STATUS      Continent Diet (Clear Liquids)  AMBULATORY STATUS COMMUNICATION OF NEEDS Skin   Limited Assist Verbally Normal                       Personal Care Assistance Level of Assistance  Bathing, Feeding, Dressing Bathing Assistance: Limited assistance Feeding assistance: Independent Dressing Assistance: Limited assistance     Functional Limitations Info             SPECIAL CARE FACTORS FREQUENCY                       Contractures Contractures Info: Not present    Additional Factors Info  Code Status, Allergies Code Status Info: DNR Allergies Info: NKA           Current Medications (08/28/2015):   Discharge Medications: START taking these medications   Details  ferrous sulfate 325 (65 FE) MG tablet Take 1 tablet (325 mg total) by mouth 2 (two) times daily with a meal. Qty: 90 tablet, Refills: 0    pantoprazole (PROTONIX) 40 MG tablet Take 1 tablet (40 mg total) by mouth daily at 12 noon. Qty: 30 tablet, Refills: 0      CONTINUE these medications which have CHANGED   Details  ALPRAZolam (XANAX) 0.5 MG tablet Take 1 tablet (0.5 mg  total) by mouth 2 (two) times daily as needed for anxiety. Qty: 30 tablet, Refills: 0   Associated Diagnoses: Anxiety state    aspirin EC 81 MG tablet Take 1 tablet (81 mg total) by mouth daily. Resume on 09/11/15   Associated Diagnoses: Atherosclerotic heart disease of native coronary artery with angina pectoris ([place]); Atrial fibrillation (HCC)    feeding supplement (BOOST / RESOURCE BREEZE) LIQD Take 1 Container by mouth 3 (three) times daily between meals. Qty: 90 Container, Refills: 0       CONTINUE these medications which have NOT CHANGED   Details  amiodarone (PACERONE) 200 MG tablet Take 1 tablet (200 mg total) by mouth daily. Qty: 30 tablet, Refills: 5    buPROPion (WELLBUTRIN SR) 150 MG 12 hr tablet Take 150 mg by mouth 2 (two) times daily.     metoprolol (LOPRESSOR) 50 MG tablet Take 50 mg by mouth 2 (two) times daily.    Multiple Vitamin (MULTIVITAMIN WITH MINERALS) TABS tablet Take 1 tablet by mouth every evening.    pravastatin (PRAVACHOL) 40 MG tablet Take 40 mg by mouth every evening.    sertraline (ZOLOFT) 100 MG tablet Take 100 mg by mouth every evening.    traZODone (DESYREL) 50 MG tablet Take 50 mg by mouth at bedtime.    albuterol (PROAIR HFA) 108 (90 BASE) MCG/ACT inhaler Inhale 1-2 puffs into the lungs every 6 (six) hours as needed for wheezing or shortness of breath. Qty: 1 Inhaler, Refills: 3   Associated Diagnoses: Chronic obstructive pulmonary disease, unspecified COPD type (HCC)    bisacodyl (DULCOLAX) 5 MG EC tablet Take 5 mg by mouth daily as needed for moderate constipation.    nitroGLYCERIN (NITROSTAT) 0.4 MG SL tablet Place 0.4 mg under the tongue every 5 (five) minutes as needed for chest pain.      STOP taking these medications     omeprazole (PRILOSEC) 20 MG capsule      Rivaroxaban (XARELTO) 15 MG TABS tablet      sulfamethoxazole-trimethoprim (BACTRIM DS,SEPTRA DS) 800-160 MG tablet         Relevant Imaging Results:  Relevant Lab Results:   Additional [place]ha Guerin, LCSWA

## 2015-08-28 NOTE — Progress Notes (Signed)
Pt given discharge instructions, prescriptions, and care notes. Pt verbalized understanding AEB no further questions or concerns at this time. IV was discontinued, no redness, pain, or swelling noted at this time. Telemetry discontinued and Centralized Telemetry was notified. Pt left the floor via wheelchair with staff in stable condition. 

## 2015-08-28 NOTE — Progress Notes (Signed)
Patient will DC to: [facility] Anticipated DC date: 08/28/15 Family notified: Son, [name] Transport by: Son will transport patient by car  CSW signing off.  Cedric Fishman, [place] Social Worker [phone]

## 2015-08-28 NOTE — Clinical Social Work Note (Signed)
Clinical Social Work Assessment  Patient Details  Name: Logan Day MRN: [id number] Date of Birth: February 06, 1933  Date of referral:  08/28/15               Reason for consult:  Facility Placement                Permission sought to share information with:  Family Supports, Customer service manager Permission granted to share information::  No  Name::     [name]  Agency::  [facility]  Relationship::  Son  Sport and exercise psychologist Information:  [phone]  Housing/Transportation Living arrangements for the past 2 months:  [place] of Information:  Adult Children Patient Interpreter Needed:  None Criminal Activity/Legal Involvement Pertinent to Current Situation/Hospitalization:  No - Comment as needed Significant Relationships:  Adult Children Lives with:  Self Do you feel safe going back to the place where you live?  Yes Need for family participation in patient care:  Yes (Comment)  Care giving concerns:  CSW consulted regarding discharge plan. CSW spoke with patient and patient's son who are in agreement with plan to return to [facility].  Social Worker assessment / plan:  Patient to discharge to [facility].  Employment status:  Retired Nurse, adult PT Recommendations:  Home with [place] / Referral to community resources:     Patient/Family's Response to care:  Patient's son will be transporting patient by car.  Patient/Family's Understanding of and Emotional Response to Diagnosis, Current Treatment, and Prognosis:  No questions/concerns.  Emotional Assessment Appearance:  Appears stated age Attitude/Demeanor/Rapport:    Affect (typically observed):  Unable to Assess Orientation:  Oriented to Self, Oriented to Place, Oriented to  Time, Oriented to Situation Alcohol / Substance use:    Psych involvement (Current and /or in the community):  No (Comment)  Discharge Needs  Concerns to be  addressed:  No discharge needs identified Readmission within the last 30 days:    Current discharge risk:  None Barriers to Discharge:  No Barriers Identified   Benard Halsted, [place] 08/28/2015, 1:06 PM

## 2015-09-18 ENCOUNTER — Emergency Department (HOSPITAL_COMMUNITY): Payer: Medicare Other

## 2015-09-18 ENCOUNTER — Encounter (HOSPITAL_COMMUNITY): Payer: Self-pay | Admitting: Emergency Medicine

## 2015-09-18 ENCOUNTER — Inpatient Hospital Stay (HOSPITAL_COMMUNITY)
Admission: EM | Admit: 2015-09-18 | Discharge: 2015-09-29 | DRG: 356 | Disposition: A | Discharge status: Skilled Nursing Facility | Payer: Medicare Other | Attending: Internal Medicine | Admitting: Internal Medicine

## 2015-09-18 DIAGNOSIS — Z66 Do not resuscitate: Secondary | ICD-10-CM | POA: Diagnosis present

## 2015-09-18 DIAGNOSIS — Z79899 Other long term (current) drug therapy: Secondary | ICD-10-CM

## 2015-09-18 DIAGNOSIS — I480 Paroxysmal atrial fibrillation: Secondary | ICD-10-CM | POA: Diagnosis present

## 2015-09-18 DIAGNOSIS — Z87891 Personal history of nicotine dependence: Secondary | ICD-10-CM

## 2015-09-18 DIAGNOSIS — K5669 Other intestinal obstruction: Secondary | ICD-10-CM | POA: Diagnosis present

## 2015-09-18 DIAGNOSIS — I130 Hypertensive heart and chronic kidney disease with heart failure and stage 1 through stage 4 chronic kidney disease, or unspecified chronic kidney disease: Secondary | ICD-10-CM | POA: Diagnosis present

## 2015-09-18 DIAGNOSIS — I10 Essential (primary) hypertension: Secondary | ICD-10-CM | POA: Diagnosis present

## 2015-09-18 DIAGNOSIS — I2510 Atherosclerotic heart disease of native coronary artery without angina pectoris: Secondary | ICD-10-CM | POA: Diagnosis present

## 2015-09-18 DIAGNOSIS — E876 Hypokalemia: Secondary | ICD-10-CM | POA: Diagnosis not present

## 2015-09-18 DIAGNOSIS — Z8546 Personal history of malignant neoplasm of prostate: Secondary | ICD-10-CM | POA: Diagnosis not present

## 2015-09-18 DIAGNOSIS — N390 Urinary tract infection, site not specified: Secondary | ICD-10-CM | POA: Diagnosis present

## 2015-09-18 DIAGNOSIS — J449 Chronic obstructive pulmonary disease, unspecified: Secondary | ICD-10-CM | POA: Diagnosis present

## 2015-09-18 DIAGNOSIS — I252 Old myocardial infarction: Secondary | ICD-10-CM

## 2015-09-18 DIAGNOSIS — F0391 Unspecified dementia with behavioral disturbance: Secondary | ICD-10-CM | POA: Diagnosis present

## 2015-09-18 DIAGNOSIS — I82402 Acute embolism and thrombosis of unspecified deep veins of left lower extremity: Secondary | ICD-10-CM | POA: Diagnosis not present

## 2015-09-18 DIAGNOSIS — I5022 Chronic systolic (congestive) heart failure: Secondary | ICD-10-CM | POA: Diagnosis present

## 2015-09-18 DIAGNOSIS — K219 Gastro-esophageal reflux disease without esophagitis: Secondary | ICD-10-CM | POA: Diagnosis present

## 2015-09-18 DIAGNOSIS — F411 Generalized anxiety disorder: Secondary | ICD-10-CM

## 2015-09-18 DIAGNOSIS — G9340 Encephalopathy, unspecified: Secondary | ICD-10-CM | POA: Diagnosis not present

## 2015-09-18 DIAGNOSIS — B961 Klebsiella pneumoniae [K. pneumoniae] as the cause of diseases classified elsewhere: Secondary | ICD-10-CM | POA: Diagnosis present

## 2015-09-18 DIAGNOSIS — K565 Intestinal adhesions [bands] with obstruction (postprocedural) (postinfection): Secondary | ICD-10-CM | POA: Diagnosis present

## 2015-09-18 DIAGNOSIS — Z8673 Personal history of transient ischemic attack (TIA), and cerebral infarction without residual deficits: Secondary | ICD-10-CM | POA: Diagnosis not present

## 2015-09-18 DIAGNOSIS — Z955 Presence of coronary angioplasty implant and graft: Secondary | ICD-10-CM

## 2015-09-18 DIAGNOSIS — N182 Chronic kidney disease, stage 2 (mild): Secondary | ICD-10-CM | POA: Diagnosis present

## 2015-09-18 DIAGNOSIS — E785 Hyperlipidemia, unspecified: Secondary | ICD-10-CM | POA: Diagnosis present

## 2015-09-18 DIAGNOSIS — R188 Other ascites: Secondary | ICD-10-CM | POA: Diagnosis present

## 2015-09-18 DIAGNOSIS — N183 Chronic kidney disease, stage 3 (moderate): Secondary | ICD-10-CM | POA: Diagnosis present

## 2015-09-18 DIAGNOSIS — I82622 Acute embolism and thrombosis of deep veins of left upper extremity: Secondary | ICD-10-CM | POA: Diagnosis present

## 2015-09-18 DIAGNOSIS — I4581 Long QT syndrome: Secondary | ICD-10-CM | POA: Diagnosis present

## 2015-09-18 DIAGNOSIS — K56609 Unspecified intestinal obstruction, unspecified as to partial versus complete obstruction: Secondary | ICD-10-CM

## 2015-09-18 DIAGNOSIS — N179 Acute kidney failure, unspecified: Secondary | ICD-10-CM | POA: Diagnosis present

## 2015-09-18 DIAGNOSIS — D500 Iron deficiency anemia secondary to blood loss (chronic): Secondary | ICD-10-CM | POA: Diagnosis present

## 2015-09-18 DIAGNOSIS — Z7982 Long term (current) use of aspirin: Secondary | ICD-10-CM

## 2015-09-18 DIAGNOSIS — Z0189 Encounter for other specified special examinations: Secondary | ICD-10-CM

## 2015-09-18 LAB — CBC WITH DIFFERENTIAL/PLATELET
BASOS ABS: 0 10*3/uL (ref 0.0–0.1)
Basophils Relative: 0 %
Eosinophils Absolute: 0.2 10*3/uL (ref 0.0–0.7)
Eosinophils Relative: 2 %
HEMATOCRIT: 36.3 % — AB (ref 39.0–52.0)
HEMOGLOBIN: 11.5 g/dL — AB (ref 13.0–17.0)
LYMPHS PCT: 9 %
Lymphs Abs: 0.9 10*3/uL (ref 0.7–4.0)
MCH: 28.9 pg (ref 26.0–34.0)
MCHC: 31.7 g/dL (ref 30.0–36.0)
MCV: 91.2 fL (ref 78.0–100.0)
MONO ABS: 0.4 10*3/uL (ref 0.1–1.0)
Monocytes Relative: 4 %
NEUTROS ABS: 8 10*3/uL — AB (ref 1.7–7.7)
Neutrophils Relative %: 85 %
Platelets: 283 10*3/uL (ref 150–400)
RBC: 3.98 MIL/uL — ABNORMAL LOW (ref 4.22–5.81)
RDW: 19 % — AB (ref 11.5–15.5)
WBC: 9.4 10*3/uL (ref 4.0–10.5)

## 2015-09-18 LAB — I-STAT CG4 LACTIC ACID, ED: LACTIC ACID, VENOUS: 1.64 mmol/L (ref 0.5–2.0)

## 2015-09-18 LAB — URINALYSIS, ROUTINE W REFLEX MICROSCOPIC
BILIRUBIN URINE: NEGATIVE
Glucose, UA: NEGATIVE mg/dL
HGB URINE DIPSTICK: NEGATIVE
Ketones, ur: 15 mg/dL — AB
Nitrite: POSITIVE — AB
PROTEIN: NEGATIVE mg/dL
Specific Gravity, Urine: 1.012 (ref 1.005–1.030)
pH: 8 (ref 5.0–8.0)

## 2015-09-18 LAB — URINE MICROSCOPIC-ADD ON

## 2015-09-18 LAB — COMPREHENSIVE METABOLIC PANEL
ALK PHOS: 63 U/L (ref 38–126)
ALT: 28 U/L (ref 17–63)
AST: 42 U/L — AB (ref 15–41)
Albumin: 3.4 g/dL — ABNORMAL LOW (ref 3.5–5.0)
Anion gap: 15 (ref 5–15)
BILIRUBIN TOTAL: 0.6 mg/dL (ref 0.3–1.2)
BUN: 18 mg/dL (ref 6–20)
CALCIUM: 9.1 mg/dL (ref 8.9–10.3)
CHLORIDE: 102 mmol/L (ref 101–111)
CO2: 21 mmol/L — ABNORMAL LOW (ref 22–32)
CREATININE: 1.74 mg/dL — AB (ref 0.61–1.24)
GFR, EST AFRICAN AMERICAN: 41 mL/min — AB (ref >60)
GFR, EST NON AFRICAN AMERICAN: 35 mL/min — AB (ref >60)
Glucose, Bld: 124 mg/dL — ABNORMAL HIGH (ref 65–99)
Potassium: 4.2 mmol/L (ref 3.5–5.1)
Sodium: 138 mmol/L (ref 135–145)
Total Protein: 7.3 g/dL (ref 6.5–8.1)

## 2015-09-18 LAB — LIPASE, BLOOD: LIPASE: 21 U/L (ref 11–51)

## 2015-09-18 LAB — TROPONIN I: Troponin I: <0.03 ng/mL (ref <0.031)

## 2015-09-18 LAB — POC OCCULT BLOOD, ED: Fecal Occult Bld: NEGATIVE

## 2015-09-18 MED ORDER — FENTANYL CITRATE (PF) 100 MCG/2ML IJ SOLN
25.0000 ug | Freq: Once | INTRAMUSCULAR | Status: AC
  Administered 2015-09-18: 25 ug via INTRAVENOUS
  Filled 2015-09-18: qty 2

## 2015-09-18 MED ORDER — SODIUM CHLORIDE 0.9 % IV SOLN: Freq: Once | INTRAVENOUS | Status: DC

## 2015-09-18 MED ORDER — ACETAMINOPHEN 325 MG PO TABS: 650.0000 mg | ORAL_TABLET | Freq: Four times a day (QID) | ORAL | Status: DC | PRN

## 2015-09-18 MED ORDER — DEXTROSE 5 % IV SOLN
1.0000 g | INTRAVENOUS | Status: DC
  Administered 2015-09-19 – 2015-09-22 (×4): 1 g via INTRAVENOUS
  Filled 2015-09-18 (×5): qty 10

## 2015-09-18 MED ORDER — NITROGLYCERIN 0.4 MG SL SUBL: 0.4000 mg | SUBLINGUAL_TABLET | SUBLINGUAL | Status: DC | PRN

## 2015-09-18 MED ORDER — ONDANSETRON HCL 4 MG/2ML IJ SOLN
4.0000 mg | Freq: Four times a day (QID) | INTRAMUSCULAR | Status: DC | PRN
  Administered 2015-09-19 – 2015-09-21 (×2): 4 mg via INTRAVENOUS
  Filled 2015-09-18 (×3): qty 2

## 2015-09-18 MED ORDER — ACETAMINOPHEN 650 MG RE SUPP: 650.0000 mg | Freq: Four times a day (QID) | RECTAL | Status: DC | PRN

## 2015-09-18 MED ORDER — ONDANSETRON HCL 4 MG/2ML IJ SOLN
4.0000 mg | Freq: Once | INTRAMUSCULAR | Status: AC
  Administered 2015-09-18: 4 mg via INTRAVENOUS
  Filled 2015-09-18: qty 2

## 2015-09-18 MED ORDER — DEXTROSE 5 % IV SOLN
1.0000 g | Freq: Once | INTRAVENOUS | Status: AC
  Administered 2015-09-18: 1 g via INTRAVENOUS
  Filled 2015-09-18: qty 10

## 2015-09-18 MED ORDER — ONDANSETRON HCL 4 MG PO TABS: 4.0000 mg | ORAL_TABLET | Freq: Four times a day (QID) | ORAL | Status: DC | PRN

## 2015-09-18 MED ORDER — HYDRALAZINE HCL 20 MG/ML IJ SOLN
10.0000 mg | INTRAMUSCULAR | Status: DC | PRN
  Administered 2015-09-24: 10 mg via INTRAVENOUS
  Filled 2015-09-18: qty 1

## 2015-09-18 MED ORDER — SODIUM CHLORIDE 0.9 % IV SOLN
INTRAVENOUS | Status: AC
  Administered 2015-09-18: 22:00:00 via INTRAVENOUS

## 2015-09-18 MED ORDER — SODIUM CHLORIDE 0.9 % IV BOLUS (SEPSIS)
1000.0000 mL | Freq: Once | INTRAVENOUS | Status: AC
  Administered 2015-09-18: 1000 mL via INTRAVENOUS

## 2015-09-18 MED ORDER — DEXTROSE-NACL 5-0.9 % IV SOLN
INTRAVENOUS | Status: AC
  Administered 2015-09-19 (×2): via INTRAVENOUS

## 2015-09-18 MED ORDER — ALBUTEROL SULFATE HFA 108 (90 BASE) MCG/ACT IN AERS: 1.0000 | INHALATION_SPRAY | Freq: Four times a day (QID) | RESPIRATORY_TRACT | Status: DC | PRN

## 2015-09-18 NOTE — Consult Note (Signed)
Reason for Consult:SBO Referring Physician: Oren Binet  Logan Day is an 82 y.o. male.  HPI: Brayten was recently admitted for hematochezia on anticoagulation. His anticoagulation was held. He did not undergo colonoscopy at that time. He was discharged back to skilled nursing. He developed abdominal pain and was evaluated in the emergency department. CT scan demonstrates small bowel obstruction at the terminal ileum where it joins the cecum. There is cecal thickening with a possible mass in that area. I was asked to see him for surgical management. His son is present and assists with history. The patient does have some dementia.  Past Medical History  Diagnosis Date  . Hypertension   . Atrial fibrillation ([place])   . Hypercholesteremia   . MI (myocardial infarction) ([place])     x 2  . Angina   . Cancer [facility])     prostate cancer  . Coronary artery disease     Circ stent (2009 cath patent)  . Prostate cancer ([place])   . Hyperlipidemia   . TIA (transient ischemic attack)   . GERD (gastroesophageal reflux disease)   . Shortness of breath dyspnea   . COPD (chronic obstructive pulmonary disease) ([place])   . Dementia   . Congestive heart failure ([place])   . GI bleed 08/2015    Past Surgical History  Procedure Laterality Date  . Prostayectomy    . Fracture surgery    . Coronary angioplasty with stent placement  10/19/2012    DES  to circumflex  . Percutaneous coronary stent intervention (pci-s) N/A 10/19/2012    Procedure: PERCUTANEOUS CORONARY STENT INTERVENTION (PCI-S);  Surgeon: Sinclair Grooms, MD;  Location: [facility] C[facility] LAB;  Service: Cardiovascular;  Laterality: N/A;    Family History  Problem Relation Age of Onset  . Coronary artery disease Son   . Heart disease Mother   . Heart disease Father     Social History:  reports that he has quit smoking. He has quit using smokeless tobacco. His smokeless tobacco use included Chew. He reports that he does not drink alcohol or use  illicit drugs.  Allergies: No Known Allergies  Medications: Prior to Admission:  (Not in a hospital admission)  Results for orders placed or performed during the hospital encounter of 09/18/15 (from the past 48 hour(s))  CBC with Differential/Platelet     Status: Abnormal   Collection Time: 09/18/15  5:42 PM  Result Value Ref Range   WBC 9.4 4.0 - 10.5 K/uL   RBC 3.98 (L) 4.22 - 5.81 MIL/uL   Hemoglobin 11.5 (L) 13.0 - 17.0 g/dL   HCT 36.3 (L) 39.0 - 52.0 %   MCV 91.2 78.0 - 100.0 fL   MCH 28.9 26.0 - 34.0 pg   MCHC 31.7 30.0 - 36.0 g/dL   RDW 19.0 (H) 11.5 - 15.5 %   Platelets 283 150 - 400 K/uL   Neutrophils Relative % 85 %   Neutro Abs 8.0 (H) 1.7 - 7.7 K/uL   Lymphocytes Relative 9 %   Lymphs Abs 0.9 0.7 - 4.0 K/uL   Monocytes Relative 4 %   Monocytes Absolute 0.4 0.1 - 1.0 K/uL   Eosinophils Relative 2 %   Eosinophils Absolute 0.2 0.0 - 0.7 K/uL   Basophils Relative 0 %   Basophils Absolute 0.0 0.0 - 0.1 K/uL  Comprehensive metabolic panel     Status: Abnormal   Collection Time: 09/18/15  5:42 PM  Result Value Ref Range   Sodium 138 135 -  145 mmol/L   Potassium 4.2 3.5 - 5.1 mmol/L   Chloride 102 101 - 111 mmol/L   CO2 21 (L) 22 - 32 mmol/L   Glucose, Bld 124 (H) 65 - 99 mg/dL   BUN 18 6 - 20 mg/dL   Creatinine, Ser 1.74 (H) 0.61 - 1.24 mg/dL   Calcium 9.1 8.9 - 10.3 mg/dL   Total Protein 7.3 6.5 - 8.1 g/dL   Albumin 3.4 (L) 3.5 - 5.0 g/dL   AST 42 (H) 15 - 41 U/L   ALT 28 17 - 63 U/L   Alkaline Phosphatase 63 38 - 126 U/L   Total Bilirubin 0.6 0.3 - 1.2 mg/dL   GFR calc non Af Amer 35 (L) >60 mL/min   GFR calc Af Amer 41 (L) >60 mL/min    Comment: (NOTE) The eGFR has been calculated using the CKD EPI equation. This calculation has not been validated in all clinical situations. eGFR's persistently <60 mL/min signify possible Chronic Kidney Disease.    Anion gap 15 5 - 15  Lipase, blood     Status: None   Collection Time: 09/18/15  5:42 PM  Result Value  Ref Range   Lipase 21 11 - 51 U/L  Troponin I     Status: None   Collection Time: 09/18/15  5:42 PM  Result Value Ref Range   Troponin I <0.03 <0.031 ng/mL    Comment:        NO INDICATION OF MYOCARDIAL INJURY.   POC occult blood, ED Provider will collect     Status: None   Collection Time: 09/18/15  5:55 PM  Result Value Ref Range   Fecal Occult Bld NEGATIVE NEGATIVE  Urinalysis, Routine w reflex microscopic (not at [facility])     Status: Abnormal   Collection Time: 09/18/15  6:56 PM  Result Value Ref Range   Color, Urine YELLOW YELLOW   APPearance TURBID (A) CLEAR   Specific Gravity, Urine 1.012 1.005 - 1.030   pH 8.0 5.0 - 8.0   Glucose, UA NEGATIVE NEGATIVE mg/dL   Hgb urine dipstick NEGATIVE NEGATIVE   Bilirubin Urine NEGATIVE NEGATIVE   Ketones, ur 15 (A) NEGATIVE mg/dL   Protein, ur NEGATIVE NEGATIVE mg/dL   Nitrite POSITIVE (A) NEGATIVE   Leukocytes, UA TRACE (A) NEGATIVE  Urine microscopic-add on     Status: Abnormal   Collection Time: 09/18/15  6:56 PM  Result Value Ref Range   Squamous Epithelial / LPF 0-5 (A) NONE SEEN   WBC, UA 0-5 0 - 5 WBC/hpf   RBC / HPF 0-5 0 - 5 RBC/hpf   Bacteria, UA MANY (A) NONE SEEN   Urine-Other AMORPHOUS URATES/PHOSPHATES   I-Stat CG4 Lactic Acid, ED     Status: None   Collection Time: 09/18/15  8:54 PM  Result Value Ref Range   Lactic Acid, Venous 1.64 0.5 - 2.0 mmol/L    Ct Abdomen Pelvis Wo Contrast  09/18/2015  CLINICAL DATA:  82 year old male with left flank pain radiating into the left lower quadrant with associated vomiting EXAM: CT ABDOMEN AND PELVIS WITHOUT CONTRAST TECHNIQUE: Multidetector CT imaging of the abdomen and pelvis was performed following the standard protocol without IV contrast. COMPARISON:  Prior CT scan of the chest including the upper abdomen 11/30/2014 FINDINGS: Lower Chest: Small right-sided pleural effusion and associated right lower lobe atelectasis. Background of emphysema and chronic interstitial  prominence. The lungs are hyperinflated. Borderline cardiomegaly. No pericardial effusion. Unremarkable imaged distal thoracic esophagus. Abdomen: Unenhanced  CT was performed per clinician order. Lack of IV contrast limits sensitivity and specificity, especially for evaluation of abdominal/pelvic solid viscera. Within these limitations, unremarkable CT appearance of the stomach, duodenum, spleen, adrenal glands and pancreas. Normal hepatic contour morphology. No discrete hepatic lesion. Small stones layer within the gallbladder lumen. No intra or extrahepatic biliary ductal dilatation. Nonobstructing nephrolithiasis bilaterally. Nonspecific perinephric stranding bilaterally. No evidence of hydronephrosis. Multiple loops of dilated and fluid-filled small bowel throughout the abdomen. The source of obstruction appears to be the ileocecal junction. There is diffuse submucosal edema of the cecum in the region of the ileocecal junction. Evaluations significantly limited in the absence of intravenous contrast material. The appendix is identified and is normal. There is associated mesenteric edema and small volume ascites. Colonic diverticular disease without CT evidence of active inflammation. Pelvis: Surgical changes of prior prostatectomy. The bladder is unremarkable. Musculoskeletal: No acute fracture or aggressive appearing lytic or blastic osseous lesion. Vascular: Limited evaluation in the absence of intravenous contrast. Moderate atherosclerotic vascular calcifications without aneurysm. IMPRESSION: 1. High-grade distal small bowel obstruction. The location of the obstruction appears to be at the ileocecal junction were there is diffuse submucosal edema involving the cecum. Differential considerations include terminal ileitis, typhlitis with secondary obstruction, and potentially an obstructing ileocecal mass. Evaluation is limited in the absence of intravenous contrast. There is associated mesenteric edema and  small volume ascites related to this process. 2. Bilateral nonobstructing nephrolithiasis. 3. The appendix is identified and is normal. 4. Colonic diverticular disease without CT evidence of active inflammation. 5. Pulmonary emphysema and chronic interstitial prominence. 6. Small right-sided pleural effusion. 7. Borderline cardiomegaly. 8. Cholelithiasis without evidence of acute cholecystitis. 9. Nonspecific perinephric stranding bilaterally consistent with the clinical history of renal dysfunction. 10. Surgical changes of prior prostatectomy. 11. Atherosclerotic vascular calcifications. Electronically Signed   By: Jacqulynn Cadet M.D.   On: 09/18/2015 20:50   Dg Chest Portable 1 View  09/18/2015  CLINICAL DATA:  Generalized abdominal pain. EXAM: PORTABLE CHEST 1 VIEW COMPARISON:  August 26, 2015 FINDINGS: The cardiomediastinal silhouette is stable. A torturous thoracic aorta is again identified. No pneumothorax. No pulmonary nodules, masses, or focal infiltrates. IMPRESSION: No active disease. Electronically Signed   By: Dorise Bullion III M.D   On: 09/18/2015 18:08   Dg Abd Portable 1v  09/18/2015  CLINICAL DATA:  Abdominal pain for 1 day EXAM: PORTABLE ABDOMEN - 1 VIEW COMPARISON:  08/26/2015 FINDINGS: Scattered large and small bowel gas is noted. No obstructive changes are noted. Postoperative changes are seen. No free air is noted. No acute bony abnormality is noted. IMPRESSION: No acute abnormality seen. Electronically Signed   By: Inez Catalina M.D.   On: 09/18/2015 18:12    Review of Systems  Constitutional: Negative for fever.  HENT: Negative.   Eyes: Negative.   Respiratory: Negative for cough.   Cardiovascular: Negative for chest pain.  Gastrointestinal: Positive for nausea, vomiting and abdominal pain.  Genitourinary: Negative.   Musculoskeletal: Negative.   Skin: Negative.   Neurological:       Dementia  Endo/Heme/Allergies: Negative.   Psychiatric/Behavioral: Negative.     Blood pressure 139/65, pulse 51, resp. rate 14, SpO2 93 %. Physical Exam  Constitutional: He appears well-developed and well-nourished. No distress.  HENT:  Head: Normocephalic.  Nose: Nose normal.  Mouth/Throat: Oropharynx is clear and moist.  NG tube  Eyes: EOM are normal. Pupils are equal, round, and reactive to light.  Neck: Neck supple. No tracheal deviation present.  Cardiovascular:  Heart rate 50s  Respiratory: Effort normal. No stridor. No respiratory distress. He has no wheezes.  GI: Soft. He exhibits no distension. There is no tenderness. There is no rebound and no guarding.  Hypoactive bowel sounds, no significant tenderness  Musculoskeletal: Normal range of motion.  Neurological: He is alert.  Skin: Skin is warm.  Psychiatric: He has a normal mood and affect.    Assessment/Plan: Small bowel obstruction at junction of the terminal ileum and the cecum, possibly do to cecal mass. This may be what caused his hematochezia on recent admission. Agree with NG tube and medical admission. Recommend GI consultation for possible colonoscopy. We will follow with you. I spoke with his son regarding our plan.  Zalayah Pizzuto E 09/18/2015, 9:52 PM

## 2015-09-18 NOTE — ED Provider Notes (Signed)
CSN: [id number]     Arrival date & time 09/18/15  1712 History   First MD Initiated Contact with Patient 09/18/15 1726     Chief Complaint  Patient presents with  . Abdominal Pain     (Consider location/radiation/quality/duration/timing/severity/associated sxs/prior Treatment) HPI Comments: Level V caveat for dementia. Patient nursing home with acute onset of lower abdominal pain with nausea and vomiting that started 3 hours ago. No blood in the emesis. Denies any diarrhea or bloody bowel movements. He was admitted for a GI bleed in January and taken off of his blood thinners. He did not have endoscopy performed. Patient with history of dementia so history is limited. Reports lower abdominal pain with multiple causes of vomiting. Has not had this pain in the past. Denies chest pain or shortness of breath. Endorses some dizziness and lightheadedness. Denies sick contacts. Pale-appearing on arrival. No reported blood in the stool or emesis.  Patient is a 82 y.o. male presenting with abdominal pain. The history is provided by the patient and the EMS personnel. The history is limited by the condition of the patient.  Abdominal Pain   Past Medical History  Diagnosis Date  . Hypertension   . Atrial fibrillation ([place])   . Hypercholesteremia   . MI (myocardial infarction) ([place])     x 2  . Angina   . Cancer [facility])     prostate cancer  . Coronary artery disease     Circ stent (2009 cath patent)  . Prostate cancer ([place])   . Hyperlipidemia   . TIA (transient ischemic attack)   . GERD (gastroesophageal reflux disease)   . Shortness of breath dyspnea   . COPD (chronic obstructive pulmonary disease) ([place])   . Dementia   . Congestive heart failure ([place])   . GI bleed 08/2015   Past Surgical History  Procedure Laterality Date  . Prostayectomy    . Fracture surgery    . Coronary angioplasty with stent placement  10/19/2012    DES  to circumflex  . Percutaneous coronary stent intervention  (pci-s) N/A 10/19/2012    Procedure: PERCUTANEOUS CORONARY STENT INTERVENTION (PCI-S);  Surgeon: Sinclair Grooms, MD;  Location: [facility] CATH LAB;  Service: Cardiovascular;  Laterality: N/A;   Family History  Problem Relation Age of Onset  . Coronary artery disease Son   . Heart disease Mother   . Heart disease Father    Social History  Substance Use Topics  . Smoking status: Former Smoker -- 1.00 packs/day for 40 years  . Smokeless tobacco: Former Systems developer    Types: Chew     Comment: quit smoking 2002  . Alcohol Use: No    Review of Systems  Unable to perform ROS: Dementia  Gastrointestinal: Positive for abdominal pain.      Allergies  Review of patient's allergies indicates no known allergies.  Home Medications   Prior to Admission medications   Medication Sig Start Date End Date Taking? Authorizing Provider  acetaminophen (TYLENOL) 500 MG tablet Take 500 mg by mouth 2 (two) times daily.   Yes Historical Provider, MD  albuterol (PROAIR HFA) 108 (90 BASE) MCG/ACT inhaler Inhale 1-2 puffs into the lungs every 6 (six) hours as needed for wheezing or shortness of breath. 05/06/15  Yes Pleas Koch, NP  amiodarone (PACERONE) 200 MG tablet Take 1 tablet (200 mg total) by mouth daily. 01/22/15  Yes Belva Crome, MD  aspirin EC 81 MG tablet Take 1 tablet (81 mg total) by  mouth daily. Resume on 09/11/15 09/11/15  Yes Shanker Kristeen Mans, MD  bisacodyl (DULCOLAX) 5 MG EC tablet Take 5 mg by mouth daily as needed for moderate constipation.   Yes Historical Provider, MD  buPROPion (WELLBUTRIN SR) 150 MG 12 hr tablet Take 150 mg by mouth 2 (two) times daily.  08/01/14  Yes Historical Provider, MD  feeding supplement (BOOST / RESOURCE BREEZE) LIQD Take 1 Container by mouth 3 (three) times daily between meals. 08/28/15  Yes Shanker Kristeen Mans, MD  ferrous sulfate 325 (65 FE) MG tablet Take 1 tablet (325 mg total) by mouth 2 (two) times daily with a meal. 08/28/15  Yes Shanker Kristeen Mans, MD   nitroGLYCERIN (NITROSTAT) 0.4 MG SL tablet Place 0.4 mg under the tongue every 5 (five) minutes as needed for chest pain.   Yes Historical Provider, MD  ALPRAZolam Duanne Moron) 0.5 MG tablet Take 1 tablet (0.5 mg total) by mouth 2 (two) times daily as needed for anxiety. 08/28/15   Shanker Kristeen Mans, MD  pantoprazole (PROTONIX) 40 MG tablet Take 1 tablet (40 mg total) by mouth daily at 12 noon. 08/28/15   Shanker Kristeen Mans, MD  pravastatin (PRAVACHOL) 40 MG tablet Take 40 mg by mouth every evening.    Historical Provider, MD  sertraline (ZOLOFT) 100 MG tablet Take 100 mg by mouth every evening.    Historical Provider, MD  traZODone (DESYREL) 50 MG tablet Take 50 mg by mouth at bedtime.    Historical Provider, MD   BP 150/69 mmHg  Pulse 53  Resp 13  SpO2 91% Physical Exam  Constitutional: He appears well-developed and well-nourished. He appears distressed.  Uncomfortable, dry heaving, pale-appearing  HENT:  Head: Normocephalic and atraumatic.  Mouth/Throat: Oropharynx is clear and moist. No oropharyngeal exudate.  Eyes: Conjunctivae and EOM are normal. Pupils are equal, round, and reactive to light.  Pale conjunctiva  Neck: Normal range of motion. Neck supple.  Cardiovascular: Normal rate, regular rhythm, normal heart sounds and intact distal pulses.   No murmur heard. Pulmonary/Chest: Effort normal and breath sounds normal. No respiratory distress. He exhibits no tenderness.  Abdominal: Soft. Bowel sounds are normal. There is tenderness. There is no rebound and no guarding.  Diffuse lower abdominal tenderness  Genitourinary:  Minimal stool obtained. No gross blood. External hemorrhoids  Musculoskeletal: Normal range of motion. He exhibits no edema or tenderness.  Neurological: He is alert. No cranial nerve deficit. He exhibits normal muscle tone. Coordination normal.  Oriented x1.  Moving all extremities    ED Course  Procedures (including critical care time) Labs Review Labs Reviewed   CBC WITH DIFFERENTIAL/PLATELET - Abnormal; Notable for the following:    RBC 3.98 (*)    Hemoglobin 11.5 (*)    HCT 36.3 (*)    RDW 19.0 (*)    Neutro Abs 8.0 (*)    All other components within normal limits  COMPREHENSIVE METABOLIC PANEL - Abnormal; Notable for the following:    CO2 21 (*)    Glucose, Bld 124 (*)    Creatinine, Ser 1.74 (*)    Albumin 3.4 (*)    AST 42 (*)    GFR calc non Af Amer 35 (*)    GFR calc Af Amer 41 (*)    All other components within normal limits  URINALYSIS, ROUTINE W REFLEX MICROSCOPIC (NOT AT [facility]) - Abnormal; Notable for the following:    APPearance TURBID (*)    Ketones, ur 15 (*)    Nitrite POSITIVE (*)  Leukocytes, UA TRACE (*)    All other components within normal limits  URINE MICROSCOPIC-ADD ON - Abnormal; Notable for the following:    Squamous Epithelial / LPF 0-5 (*)    Bacteria, UA MANY (*)    All other components within normal limits  URINE CULTURE  LIPASE, BLOOD  TROPONIN I  BASIC METABOLIC PANEL  CBC  I-STAT CG4 LACTIC ACID, ED  POC OCCULT BLOOD, ED  I-STAT CG4 LACTIC ACID, ED    Imaging Review Ct Abdomen Pelvis Wo Contrast  09/18/2015  CLINICAL DATA:  82 year old male with left flank pain radiating into the left lower quadrant with associated vomiting EXAM: CT ABDOMEN AND PELVIS WITHOUT CONTRAST TECHNIQUE: Multidetector CT imaging of the abdomen and pelvis was performed following the standard protocol without IV contrast. COMPARISON:  Prior CT scan of the chest including the upper abdomen 11/30/2014 FINDINGS: Lower Chest: Small right-sided pleural effusion and associated right lower lobe atelectasis. Background of emphysema and chronic interstitial prominence. The lungs are hyperinflated. Borderline cardiomegaly. No pericardial effusion. Unremarkable imaged distal thoracic esophagus. Abdomen: Unenhanced CT was performed per clinician order. Lack of IV contrast limits sensitivity and specificity, especially for evaluation of  abdominal/pelvic solid viscera. Within these limitations, unremarkable CT appearance of the stomach, duodenum, spleen, adrenal glands and pancreas. Normal hepatic contour morphology. No discrete hepatic lesion. Small stones layer within the gallbladder lumen. No intra or extrahepatic biliary ductal dilatation. Nonobstructing nephrolithiasis bilaterally. Nonspecific perinephric stranding bilaterally. No evidence of hydronephrosis. Multiple loops of dilated and fluid-filled small bowel throughout the abdomen. The source of obstruction appears to be the ileocecal junction. There is diffuse submucosal edema of the cecum in the region of the ileocecal junction. Evaluations significantly limited in the absence of intravenous contrast material. The appendix is identified and is normal. There is associated mesenteric edema and small volume ascites. Colonic diverticular disease without CT evidence of active inflammation. Pelvis: Surgical changes of prior prostatectomy. The bladder is unremarkable. Musculoskeletal: No acute fracture or aggressive appearing lytic or blastic osseous lesion. Vascular: Limited evaluation in the absence of intravenous contrast. Moderate atherosclerotic vascular calcifications without aneurysm. IMPRESSION: 1. High-grade distal small bowel obstruction. The location of the obstruction appears to be at the ileocecal junction were there is diffuse submucosal edema involving the cecum. Differential considerations include terminal ileitis, typhlitis with secondary obstruction, and potentially an obstructing ileocecal mass. Evaluation is limited in the absence of intravenous contrast. There is associated mesenteric edema and small volume ascites related to this process. 2. Bilateral nonobstructing nephrolithiasis. 3. The appendix is identified and is normal. 4. Colonic diverticular disease without CT evidence of active inflammation. 5. Pulmonary emphysema and chronic interstitial prominence. 6. Small  right-sided pleural effusion. 7. Borderline cardiomegaly. 8. Cholelithiasis without evidence of acute cholecystitis. 9. Nonspecific perinephric stranding bilaterally consistent with the clinical history of renal dysfunction. 10. Surgical changes of prior prostatectomy. 11. Atherosclerotic vascular calcifications. Electronically Signed   By: Jacqulynn Cadet M.D.   On: 09/18/2015 20:50   Dg Chest Portable 1 View  09/18/2015  CLINICAL DATA:  Generalized abdominal pain. EXAM: PORTABLE CHEST 1 VIEW COMPARISON:  August 26, 2015 FINDINGS: The cardiomediastinal silhouette is stable. A torturous thoracic aorta is again identified. No pneumothorax. No pulmonary nodules, masses, or focal infiltrates. IMPRESSION: No active disease. Electronically Signed   By: Dorise Bullion III M.D   On: 09/18/2015 18:08   Dg Abd Portable 1v  09/18/2015  CLINICAL DATA:  Abdominal pain for 1 day EXAM: PORTABLE ABDOMEN - 1 VIEW  COMPARISON:  08/26/2015 FINDINGS: Scattered large and small bowel gas is noted. No obstructive changes are noted. Postoperative changes are seen. No free air is noted. No acute bony abnormality is noted. IMPRESSION: No acute abnormality seen. Electronically Signed   By: Inez Catalina M.D.   On: 09/18/2015 18:12   I have personally reviewed and evaluated these images and lab results as part of my medical decision-making.   EKG Interpretation   Date/Time:  Thursday September 18 2015 18:00:34 EST Ventricular Rate:  66 PR Interval:  274 QRS Duration: 126 QT Interval:  544 QTC Calculation: 570 R Axis:   85 Text Interpretation:  Sinus or ectopic atrial rhythm Prolonged PR interval  Nonspecific intraventricular conduction delay Borderline repolarization  abnormality slight worse lateral and inferior ST depression No significant  change was found Confirmed by Wyvonnia Dusky  MD, Cadie Sorci [phone]) on 09/18/2015  6:10:33 PM      MDM   Final diagnoses:  Small bowel obstruction (HCC)  Urinary tract infection  without hematuria, site unspecified   Acute onset of abdominal pain with nausea and vomiting. Recent admission for GI bleed. He is no longer on the blood thinners. Abdomen is soft without peritoneal signs.  Labs show stable hemoglobin and creatinine. Patient given IV fluids and IV Zofran.  CT scan shows high-grade distal small bowel obstruction ileocecal junction, possible mass. Patient did not have endoscopy during his recent admission for GI bleed.  No further vomiting.  discussed with Dr. Grandville Silos he will evaluate. Recommends medical admission and NG tube. Continue IV hydration and nothing by mouth.  Admission d.w Dr. Hal Hope.  NG placed. Abdomen soft. No further vomiting in the ED. Treat possible UTI.   Ezequiel Essex, MD 09/18/15 [phone]

## 2015-09-18 NOTE — ED Notes (Signed)
Per EMS: pt arrives from [facility] for eval of generalized abd pain that started x1 day, pt states some nausea with spitting. Pt denies any bloody diarrhea, recently seen for GI bleed, currently on no xarelto at this time. Pt with hx of dementia but answering questions appropriately., given 4mg  of zofran en route.

## 2015-09-18 NOTE — ED Notes (Signed)
Family at bedside. 

## 2015-09-18 NOTE — ED Notes (Signed)
MD at bedside. 

## 2015-09-18 NOTE — ED Notes (Signed)
md at bedside to talk with family

## 2015-09-18 NOTE — ED Notes (Signed)
Pt transported to ct on 2L nasal cannula in nad. Alert and oriented.

## 2015-09-18 NOTE — ED Notes (Signed)
Pt sleeping and oxygen SATs noted to be 87% room air, pt placed on 2L nasal cannula and oxygen sats increased to 98%. Pt in nad.

## 2015-09-18 NOTE — H&P (Signed)
[facility] History and Physical  Logan Day [id number] DOB: [birth year] DOA: 09/18/2015  Referring physician: Dr. Wyvonnia Dusky. PCP: No primary care provider on file.  Specialists: None.  Chief Complaint: Abdominal pain nausea vomiting.  HPI: Logan Day is a 82 y.o. male who was recently admitted for GI bleed and at that time patient's xarelto was discontinued presents to the ER because of abdominal pain and nausea vomiting. Patient states he had a large bowel movement this morning and later started developing abdominal pain in the lower quadrants. Had multiple episodes of nausea and vomiting. CT abdomen and pelvis shows high-grade bowel obstruction at the ileocecal junction. On-call surgeon Dr. Grandville Silos was consulted and the patient was placed on NG tube and admitted for further management. Patient otherwise denies any chest pain or shortness of breath.   Review of Systems: As presented in the history of presenting illness, rest negative.  Past Medical History  Diagnosis Date  . Hypertension   . Atrial fibrillation ([place])   . Hypercholesteremia   . MI (myocardial infarction) ([place])     x 2  . Angina   . Cancer [facility] [place])     prostate cancer  . Coronary artery disease     Circ stent (2009 cath patent)  . Prostate cancer ([place])   . Hyperlipidemia   . TIA (transient ischemic attack)   . GERD (gastroesophageal reflux disease)   . Shortness of breath dyspnea   . COPD (chronic obstructive pulmonary disease) ([place])   . Dementia   . Congestive heart failure ([place])   . GI bleed 08/2015   Past Surgical History  Procedure Laterality Date  . Prostayectomy    . Fracture surgery    . Coronary angioplasty with stent placement  10/19/2012    DES  to circumflex  . Percutaneous coronary stent intervention (pci-s) N/A 10/19/2012    Procedure: PERCUTANEOUS CORONARY STENT INTERVENTION (PCI-S);  Surgeon: Sinclair Grooms, MD;  Location: [facility] CATH LAB;  Service: Cardiovascular;   Laterality: N/A;   Social History:  reports that he has quit smoking. He has quit using smokeless tobacco. His smokeless tobacco use included Chew. He reports that he does not drink alcohol or use illicit drugs. Where does patient live nursing home. Can patient participate in ADLs? Yes.  No Known Allergies  Family History:  Family History  Problem Relation Age of Onset  . Coronary artery disease Son   . Heart disease Mother   . Heart disease Father       Prior to Admission medications   Medication Sig Start Date End Date Taking? Authorizing Provider  acetaminophen (TYLENOL) 500 MG tablet Take 500 mg by mouth 2 (two) times daily.   Yes Historical Provider, MD  albuterol (PROAIR HFA) 108 (90 BASE) MCG/ACT inhaler Inhale 1-2 puffs into the lungs every 6 (six) hours as needed for wheezing or shortness of breath. 05/06/15  Yes Pleas Koch, NP  amiodarone (PACERONE) 200 MG tablet Take 1 tablet (200 mg total) by mouth daily. 01/22/15  Yes Belva Crome, MD  aspirin EC 81 MG tablet Take 1 tablet (81 mg total) by mouth daily. Resume on 09/11/15 09/11/15  Yes Shanker Kristeen Mans, MD  bisacodyl (DULCOLAX) 5 MG EC tablet Take 5 mg by mouth daily as needed for moderate constipation.   Yes Historical Provider, MD  buPROPion (WELLBUTRIN SR) 150 MG 12 hr tablet Take 150 mg by mouth 2 (two) times daily.  08/01/14  Yes Historical Provider, MD  feeding  supplement (BOOST / RESOURCE BREEZE) LIQD Take 1 Container by mouth 3 (three) times daily between meals. 08/28/15  Yes Shanker Kristeen Mans, MD  ferrous sulfate 325 (65 FE) MG tablet Take 1 tablet (325 mg total) by mouth 2 (two) times daily with a meal. 08/28/15  Yes Shanker Kristeen Mans, MD  nitroGLYCERIN (NITROSTAT) 0.4 MG SL tablet Place 0.4 mg under the tongue every 5 (five) minutes as needed for chest pain.   Yes Historical Provider, MD  ALPRAZolam Duanne Moron) 0.5 MG tablet Take 1 tablet (0.5 mg total) by mouth 2 (two) times daily as needed for anxiety. 08/28/15    Shanker Kristeen Mans, MD  pantoprazole (PROTONIX) 40 MG tablet Take 1 tablet (40 mg total) by mouth daily at 12 noon. 08/28/15   Shanker Kristeen Mans, MD  pravastatin (PRAVACHOL) 40 MG tablet Take 40 mg by mouth every evening.    Historical Provider, MD  sertraline (ZOLOFT) 100 MG tablet Take 100 mg by mouth every evening.    Historical Provider, MD  traZODone (DESYREL) 50 MG tablet Take 50 mg by mouth at bedtime.    Historical Provider, MD    Physical Exam: Filed Vitals:   09/18/15 2115 09/18/15 2130 09/18/15 2145 09/18/15 2200  BP: 145/65 139/65 132/77 142/67  Pulse: 50 51 52 52  Resp: 15 14 12    SpO2: 97% 93% 99% 91%     General:  Moderately built and nourished.  Eyes: Anicteric no pallor.  ENT: No discharge from the ears eyes nose and mouth.  Neck: No mass felt.  Cardiovascular: S1 and S2 heard.  Respiratory: No rhonchi or crepitations.  Abdomen: Soft mildly distended no guarding or rigidity. Nontender.  Skin: No rash.  Musculoskeletal: No edema.  Psychiatric: Alert awake.  Neurologic: Alert awake oriented to place and name. Moves all extremities.  Labs on Admission:  Basic Metabolic Panel:  Recent Labs Lab 09/18/15 1742  NA 138  K 4.2  CL 102  CO2 21*  GLUCOSE 124*  BUN 18  CREATININE 1.74*  CALCIUM 9.1   Liver Function Tests:  Recent Labs Lab 09/18/15 1742  AST 42*  ALT 28  ALKPHOS 63  BILITOT 0.6  PROT 7.3  ALBUMIN 3.4*    Recent Labs Lab 09/18/15 1742  LIPASE 21   No results for input(s): AMMONIA in the last 168 hours. CBC:  Recent Labs Lab 09/18/15 1742  WBC 9.4  NEUTROABS 8.0*  HGB 11.5*  HCT 36.3*  MCV 91.2  PLT 283   Cardiac Enzymes:  Recent Labs Lab 09/18/15 1742  TROPONINI <0.03    BNP (last 3 results)  Recent Labs  11/29/14 1053 12/01/14 0530  BNP 1513.0* 424.9*    ProBNP (last 3 results) No results for input(s): PROBNP in the last 8760 hours.  CBG: No results for input(s): GLUCAP in the last 168  hours.  Radiological Exams on Admission: Ct Abdomen Pelvis Wo Contrast  09/18/2015  CLINICAL DATA:  82 year old male with left flank pain radiating into the left lower quadrant with associated vomiting EXAM: CT ABDOMEN AND PELVIS WITHOUT CONTRAST TECHNIQUE: Multidetector CT imaging of the abdomen and pelvis was performed following the standard protocol without IV contrast. COMPARISON:  Prior CT scan of the chest including the upper abdomen 11/30/2014 FINDINGS: Lower Chest: Small right-sided pleural effusion and associated right lower lobe atelectasis. Background of emphysema and chronic interstitial prominence. The lungs are hyperinflated. Borderline cardiomegaly. No pericardial effusion. Unremarkable imaged distal thoracic esophagus. Abdomen: Unenhanced CT was performed per clinician order.  Lack of IV contrast limits sensitivity and specificity, especially for evaluation of abdominal/pelvic solid viscera. Within these limitations, unremarkable CT appearance of the stomach, duodenum, spleen, adrenal glands and pancreas. Normal hepatic contour morphology. No discrete hepatic lesion. Small stones layer within the gallbladder lumen. No intra or extrahepatic biliary ductal dilatation. Nonobstructing nephrolithiasis bilaterally. Nonspecific perinephric stranding bilaterally. No evidence of hydronephrosis. Multiple loops of dilated and fluid-filled small bowel throughout the abdomen. The source of obstruction appears to be the ileocecal junction. There is diffuse submucosal edema of the cecum in the region of the ileocecal junction. Evaluations significantly limited in the absence of intravenous contrast material. The appendix is identified and is normal. There is associated mesenteric edema and small volume ascites. Colonic diverticular disease without CT evidence of active inflammation. Pelvis: Surgical changes of prior prostatectomy. The bladder is unremarkable. Musculoskeletal: No acute fracture or aggressive  appearing lytic or blastic osseous lesion. Vascular: Limited evaluation in the absence of intravenous contrast. Moderate atherosclerotic vascular calcifications without aneurysm. IMPRESSION: 1. High-grade distal small bowel obstruction. The location of the obstruction appears to be at the ileocecal junction were there is diffuse submucosal edema involving the cecum. Differential considerations include terminal ileitis, typhlitis with secondary obstruction, and potentially an obstructing ileocecal mass. Evaluation is limited in the absence of intravenous contrast. There is associated mesenteric edema and small volume ascites related to this process. 2. Bilateral nonobstructing nephrolithiasis. 3. The appendix is identified and is normal. 4. Colonic diverticular disease without CT evidence of active inflammation. 5. Pulmonary emphysema and chronic interstitial prominence. 6. Small right-sided pleural effusion. 7. Borderline cardiomegaly. 8. Cholelithiasis without evidence of acute cholecystitis. 9. Nonspecific perinephric stranding bilaterally consistent with the clinical history of renal dysfunction. 10. Surgical changes of prior prostatectomy. 11. Atherosclerotic vascular calcifications. Electronically Signed   By: Jacqulynn Cadet M.D.   On: 09/18/2015 20:50   Dg Chest Portable 1 View  09/18/2015  CLINICAL DATA:  Generalized abdominal pain. EXAM: PORTABLE CHEST 1 VIEW COMPARISON:  August 26, 2015 FINDINGS: The cardiomediastinal silhouette is stable. A torturous thoracic aorta is again identified. No pneumothorax. No pulmonary nodules, masses, or focal infiltrates. IMPRESSION: No active disease. Electronically Signed   By: Dorise Bullion III M.D   On: 09/18/2015 18:08   Dg Abd Portable 1v  09/18/2015  CLINICAL DATA:  Abdominal pain for 1 day EXAM: PORTABLE ABDOMEN - 1 VIEW COMPARISON:  08/26/2015 FINDINGS: Scattered large and small bowel gas is noted. No obstructive changes are noted. Postoperative changes  are seen. No free air is noted. No acute bony abnormality is noted. IMPRESSION: No acute abnormality seen. Electronically Signed   By: Inez Catalina M.D.   On: 09/18/2015 18:12    EKG: Independently reviewed. A. fib with heart rate control.  Assessment/Plan Active Problems:   Essential hypertension   PAF (paroxysmal atrial fibrillation) (HCC)   CKD (chronic kidney disease), stage II   Blood loss anemia   Small bowel obstruction ([facility])   UTI (lower urinary tract infection)   SBO (small bowel obstruction) ([place])   1. Small bowel obstruction - appreciate surgery consult. Patient will be kept nothing by mouth. Continue G-tube suction and will need serial x-ray of the abdomen. Further recommendations per surgery. Gentle hydration. 2. Paroxysmal atrial fibrillation - presently mildly bradycardic. Closely monitor in telemetry. Patient used to be on amiodarone and metoprolol which are on hold at this time. Patient's xarelto was discontinued due to recent GI bleed. Chads 2 vasc score was 7. 3. Hypertension - will  keep patient on when necessary IV hydralazine for systolic blood pressure more than 160. 4. UTI - patient is placed on ceftriaxone for urine cultures. 5. Chronic systolic heart failure last EF measured was 40-45% - presently receiving IV fluids closely monitor respiratory status. 6. Chronic kidney disease stage III - closely monitor metabolic panel. 7. Recent blood loss anemia had received transfusion - follow CBC. 8. COPD personally not wheezing. 9. History of hyperlipidemia.   DVT Prophylaxis SCDs.  Code Status: DO NOT RESUSCITATE.  Family Communication: Discussed with patient.  Disposition Plan: Admit to inpatient.    Dominic Rhome N. [facility] Pager [phone].  If 7PM-7AM, please contact night-coverage [email or web address] Password [facility] 09/18/2015, 10:52 PM

## 2015-09-18 NOTE — ED Notes (Signed)
Surgeon at bedside.  

## 2015-09-19 ENCOUNTER — Inpatient Hospital Stay (HOSPITAL_COMMUNITY): Payer: Medicare Other

## 2015-09-19 LAB — GLUCOSE, CAPILLARY
GLUCOSE-CAPILLARY: 111 mg/dL — AB (ref 65–99)
GLUCOSE-CAPILLARY: 123 mg/dL — AB (ref 65–99)
GLUCOSE-CAPILLARY: 133 mg/dL — AB (ref 65–99)
Glucose-Capillary: 123 mg/dL — ABNORMAL HIGH (ref 65–99)
Glucose-Capillary: 118 mg/dL — ABNORMAL HIGH (ref 65–99)

## 2015-09-19 LAB — CBC
HCT: 37.7 % — ABNORMAL LOW (ref 39.0–52.0)
HEMOGLOBIN: 11.5 g/dL — AB (ref 13.0–17.0)
MCH: 28 pg (ref 26.0–34.0)
MCHC: 30.5 g/dL (ref 30.0–36.0)
MCV: 91.7 fL (ref 78.0–100.0)
PLATELETS: 286 10*3/uL (ref 150–400)
RBC: 4.11 MIL/uL — ABNORMAL LOW (ref 4.22–5.81)
RDW: 19.1 % — ABNORMAL HIGH (ref 11.5–15.5)
WBC: 11.6 10*3/uL — ABNORMAL HIGH (ref 4.0–10.5)

## 2015-09-19 LAB — BASIC METABOLIC PANEL
ANION GAP: 11 (ref 5–15)
BUN: 17 mg/dL (ref 6–20)
CALCIUM: 8.5 mg/dL — AB (ref 8.9–10.3)
CO2: 23 mmol/L (ref 22–32)
CREATININE: 1.6 mg/dL — AB (ref 0.61–1.24)
Chloride: 104 mmol/L (ref 101–111)
GFR calc Af Amer: 45 mL/min — ABNORMAL LOW (ref >60)
GFR, EST NON AFRICAN AMERICAN: 39 mL/min — AB (ref >60)
GLUCOSE: 133 mg/dL — AB (ref 65–99)
POTASSIUM: 3.9 mmol/L (ref 3.5–5.1)
Sodium: 138 mmol/L (ref 135–145)

## 2015-09-19 LAB — MRSA PCR SCREENING: MRSA BY PCR: NEGATIVE

## 2015-09-19 MED ORDER — CHLORHEXIDINE GLUCONATE 0.12 % MT SOLN
15.0000 mL | Freq: Two times a day (BID) | OROMUCOSAL | Status: DC
  Administered 2015-09-19 – 2015-09-29 (×17): 15 mL via OROMUCOSAL
  Filled 2015-09-19 (×15): qty 15

## 2015-09-19 MED ORDER — ALBUTEROL SULFATE (2.5 MG/3ML) 0.083% IN NEBU: 2.5000 mg | INHALATION_SOLUTION | Freq: Four times a day (QID) | RESPIRATORY_TRACT | Status: DC | PRN

## 2015-09-19 MED ORDER — CETYLPYRIDINIUM CHLORIDE 0.05 % MT LIQD
7.0000 mL | Freq: Two times a day (BID) | OROMUCOSAL | Status: DC
  Administered 2015-09-20 – 2015-09-29 (×13): 7 mL via OROMUCOSAL

## 2015-09-19 MED ORDER — PANTOPRAZOLE SODIUM 40 MG IV SOLR
40.0000 mg | Freq: Two times a day (BID) | INTRAVENOUS | Status: DC
  Administered 2015-09-19 – 2015-09-29 (×20): 40 mg via INTRAVENOUS
  Filled 2015-09-19 (×21): qty 40

## 2015-09-19 NOTE — Progress Notes (Signed)
Pt does have just one episode of incontinence since morning, bladder scan done it is less than 180, pt doesnot have any urge to go and feeling relaxed, will continue to monitor

## 2015-09-19 NOTE — Progress Notes (Signed)
Utilization review completed. Niyanna Asch, RN, BSN. 

## 2015-09-19 NOTE — Progress Notes (Addendum)
NG tube tried to be pushed 6 inch further, but patient couldn't tolerate and vomited, IV Zofran provided, surgery aware, will continue to monitor

## 2015-09-19 NOTE — Progress Notes (Signed)
[facility]                                                                              Patient Demographics  Logan Day, is a 82 y.o. male, DOB - [birth year], VX:[id number]  Admit date - 09/18/2015   Admitting Physician Rise Patience, MD  Outpatient Primary MD for the patient is No primary care provider on file.  LOS - 1   Chief Complaint  Patient presents with  . Abdominal Pain       Brief HPI   Logan Day is a 82 y.o. male who was recently admitted for GI bleed and at that time patient's xarelto was discontinued presented to the ER because of abdominal pain and nausea vomiting. Patient reported that he had a large bowel movement this morning and later started developing abdominal pain in the lower quadrants with multiple episodes of nausea and vomiting. CT abdomen and pelvis shows high-grade bowel obstruction at the ileocecal junction. On-call surgeon Dr. Grandville Silos was consulted and the patient was placed on NG tube and admitted for further management.    Assessment & Plan     SBO (small bowel obstruction) (HCC) - Continue NPO status, IV fluid hydration, NG tube suction - CT abdomen showed high-grade small bowel obstruction at the ED a cecal junction ? Edema versus mass - Gen. surgery following, recommended GI consult for possible colonoscopy to rule out any cecal mass - GI consulted.    Essential hypertension - Currently stable, continue IV hydralazine as needed with parameters    PAF (paroxysmal atrial fibrillation) (HCC) - Currently rate controlled, mildly bradycardiac and 70 and drawn and metoprolol currently on hold - [place] vasc 7, however anticoagulation was discontinued during previous admission due to recent GI bleed - Discussed in detail with patient's son at the bedside, understands the risk of possible CVA in future however no further anticoagulation     CKD (chronic kidney disease), stage II - Follow creatinine closely,  currently at baseline    UTI (lower urinary tract infection) - Follow urine culture and sensitivities, continue Rocephin  Chronic systolic CHF - Currently compensated, echo 4/16  EF 40-45%, monitor respiratory status was IV fluids  COPD - Currently stable, not wheezing  Dementia - Continue to hold Wellbutrin, Xanax, Zoloft, trazodone for now  Recent GI bleed - Currently H&H stable, continue IV PPI  Code Status: DO NOT RESUSCITATE   Family Communication: Discussed in detail with the patient, all imaging results, lab results explained to the patient's sons Mr. Logan Day and Logan Day,     Disposition Plan:   Time Spent in minutes  25 minutes  Procedures  CT abd NGT suction  Consults   Surgery GI   DVT Prophylaxis  SCD's  Medications  Scheduled Meds: . sodium chloride   Intravenous Once  . cefTRIAXone (ROCEPHIN)  IV  1 g Intravenous Q24H   Continuous Infusions: . dextrose 5 % and 0.9% NaCl 75 mL/hr at 09/19/15 0047   PRN Meds:.acetaminophen **OR** acetaminophen, albuterol, hydrALAZINE, nitroGLYCERIN, ondansetron **OR** ondansetron (ZOFRAN) IV   Antibiotics   Anti-infectives    Start  Dose/Rate Route Frequency Ordered Stop   09/18/15 2300  cefTRIAXone (ROCEPHIN) 1 g in dextrose 5 % 50 mL IVPB     1 g 100 mL/hr over 30 Minutes Intravenous Every 24 hours 09/18/15 2251     09/18/15 2115  cefTRIAXone (ROCEPHIN) 1 g in dextrose 5 % 50 mL IVPB     1 g 100 mL/hr over 30 Minutes Intravenous  Once 09/18/15 2106 09/18/15 2308        Subjective:   Logan Day was seen and examined today.  Patient has dementia and hence difficult to obtain review of system. Currently denies any pain. No obvious nausea or vomiting. NG tube suction. Patient denies dizziness, new weakness, numbess, tingling. No acute events overnight.    Objective:   Filed Vitals:   09/19/15 0455 09/19/15 0805 09/19/15 1005 09/19/15 1146  BP: 143/68 108/64 116/47 122/60  Pulse: 58 53  58 55  Temp: 98.4 F (36.9 C) 99 F (37.2 C) 98.9 F (37.2 C)   TempSrc: Oral Oral Oral   Resp: 18 18  18   Height:      Weight:      SpO2: 98% 99% 99% 96%    Intake/Output Summary (Last 24 hours) at 09/19/15 1208 Last data filed at 09/19/15 0843  Gross per 24 hour  Intake 483.75 ml  Output    250 ml  Net 233.75 ml     Wt Readings from Last 3 Encounters:  09/19/15 70.806 kg (156 lb 1.6 oz)  08/27/15 71.3 kg (157 lb 3 oz)  06/10/15 71.668 kg (158 lb)     Exam  General: Alert and oriented x 2, NAD  HEENT:  PERRLA, EOMI, Anicteric Sclera, mucous membranes moist.   Neck: Supple, no JVD, no masses  CVS: S1 S2 auscultated, no rubs, murmurs or gallops. Regular rate and rhythm.  Respiratory: Clear to auscultation bilaterally, no wheezing, rales or rhonchi  Abdomen: Soft, mildly tender, hypoactive bowel sounds  Ext: no cyanosis clubbing or edema  Neuro: AAOx3, Cr N's II- XII. Strength 5/5 upper and lower extremities bilaterally  Skin: No rashes  Psych: Normal affect and demeanor, alert and oriented x2   Data Review   Micro Results Recent Results (from the past 240 hour(s))  MRSA PCR Screening     Status: None   Collection Time: 09/19/15  1:09 AM  Result Value Ref Range Status   MRSA by PCR NEGATIVE NEGATIVE Final    Comment:        The GeneXpert MRSA Assay (FDA approved for NASAL specimens only), is one component of a comprehensive MRSA colonization surveillance program. It is not intended to diagnose MRSA infection nor to guide or monitor treatment for MRSA infections.     Radiology Reports Ct Abdomen Pelvis Wo Contrast  09/18/2015  CLINICAL DATA:  82 year old male with left flank pain radiating into the left lower quadrant with associated vomiting EXAM: CT ABDOMEN AND PELVIS WITHOUT CONTRAST TECHNIQUE: Multidetector CT imaging of the abdomen and pelvis was performed following the standard protocol without IV contrast. COMPARISON:  Prior CT scan of the  chest including the upper abdomen 11/30/2014 FINDINGS: Lower Chest: Small right-sided pleural effusion and associated right lower lobe atelectasis. Background of emphysema and chronic interstitial prominence. The lungs are hyperinflated. Borderline cardiomegaly. No pericardial effusion. Unremarkable imaged distal thoracic esophagus. Abdomen: Unenhanced CT was performed per clinician order. Lack of IV contrast limits sensitivity and specificity, especially for evaluation of abdominal/pelvic solid viscera. Within these limitations, unremarkable CT appearance of  the stomach, duodenum, spleen, adrenal glands and pancreas. Normal hepatic contour morphology. No discrete hepatic lesion. Small stones layer within the gallbladder lumen. No intra or extrahepatic biliary ductal dilatation. Nonobstructing nephrolithiasis bilaterally. Nonspecific perinephric stranding bilaterally. No evidence of hydronephrosis. Multiple loops of dilated and fluid-filled small bowel throughout the abdomen. The source of obstruction appears to be the ileocecal junction. There is diffuse submucosal edema of the cecum in the region of the ileocecal junction. Evaluations significantly limited in the absence of intravenous contrast material. The appendix is identified and is normal. There is associated mesenteric edema and small volume ascites. Colonic diverticular disease without CT evidence of active inflammation. Pelvis: Surgical changes of prior prostatectomy. The bladder is unremarkable. Musculoskeletal: No acute fracture or aggressive appearing lytic or blastic osseous lesion. Vascular: Limited evaluation in the absence of intravenous contrast. Moderate atherosclerotic vascular calcifications without aneurysm. IMPRESSION: 1. High-grade distal small bowel obstruction. The location of the obstruction appears to be at the ileocecal junction were there is diffuse submucosal edema involving the cecum. Differential considerations include terminal  ileitis, typhlitis with secondary obstruction, and potentially an obstructing ileocecal mass. Evaluation is limited in the absence of intravenous contrast. There is associated mesenteric edema and small volume ascites related to this process. 2. Bilateral nonobstructing nephrolithiasis. 3. The appendix is identified and is normal. 4. Colonic diverticular disease without CT evidence of active inflammation. 5. Pulmonary emphysema and chronic interstitial prominence. 6. Small right-sided pleural effusion. 7. Borderline cardiomegaly. 8. Cholelithiasis without evidence of acute cholecystitis. 9. Nonspecific perinephric stranding bilaterally consistent with the clinical history of renal dysfunction. 10. Surgical changes of prior prostatectomy. 11. Atherosclerotic vascular calcifications. Electronically Signed   By: Jacqulynn Cadet M.D.   On: 09/18/2015 20:50   Dg Chest 2 View  08/26/2015  CLINICAL DATA:  Headache, prostate cancer, pale EXAM: CHEST  2 VIEW COMPARISON:  05/19/2015 FINDINGS: Cardiomediastinal silhouette is stable. No acute infiltrate or pleural effusion. No pulmonary edema. Osteopenia and mild degenerative changes thoracic spine. Hyperinflation again noted. IMPRESSION: No active disease. Hyperinflation again noted. Osteopenia and mild degenerative changes thoracic spine. Electronically Signed   By: Lahoma Crocker M.D.   On: 08/26/2015 14:50   Dg Abd 1 View  08/26/2015  CLINICAL DATA:  82 year old male with constipation. EXAM: ABDOMEN - 1 VIEW COMPARISON:  None. FINDINGS: The bowel gas pattern is unremarkable. There is no evidence of bowel obstruction. A small amount of stool in the colon and rectum noted. Surgical clips in the pelvis are present. IMPRESSION: Unremarkable bowel gas pattern. Electronically Signed   By: Margarette Canada M.D.   On: 08/26/2015 18:48   Ct Head Wo Contrast  08/26/2015  CLINICAL DATA:  Altered mental status and headache today. Initial encounter. EXAM: CT HEAD WITHOUT CONTRAST  TECHNIQUE: Contiguous axial images were obtained from the base of the skull through the vertex without intravenous contrast. COMPARISON:  Brain MRI 07/16/2011.  Head CT scan 07/15/2011. FINDINGS: The brain is atrophic with extensive chronic microvascular ischemic change. Remote bilateral posterior temporal and parietal infarcts are identified. No evidence of acute infarction, hemorrhage, mass lesion, midline shift or abnormal extra-axial fluid collection is seen. No pneumocephalus or hydrocephalus. The calvarium is intact. Mucous retention cysts or polyps right maxillary sinus noted. Mild ethmoid air cell disease is seen on the right. IMPRESSION: No acute abnormality. Remote bilateral parietotemporal infarcts. Atrophy and chronic microvascular ischemic change. Electronically Signed   By: Inge Rise M.D.   On: 08/26/2015 16:24   Dg Chest Portable 1 View  09/18/2015  CLINICAL DATA:  Generalized abdominal pain. EXAM: PORTABLE CHEST 1 VIEW COMPARISON:  August 26, 2015 FINDINGS: The cardiomediastinal silhouette is stable. A torturous thoracic aorta is again identified. No pneumothorax. No pulmonary nodules, masses, or focal infiltrates. IMPRESSION: No active disease. Electronically Signed   By: Dorise Bullion III M.D   On: 09/18/2015 18:08   Dg Abd 2 Views  09/19/2015  CLINICAL DATA:  Lower abdominal pain, small-bowel obstruction EXAM: ABDOMEN - 2 VIEW COMPARISON:  09/18/2015 FINDINGS: NG tube tip is in the proximal stomach with the side port in the distal esophagus. Nonobstructive bowel gas pattern. No free air organomegaly. No suspicious calcification. Surgical clips in the lower pelvis. IMPRESSION: NG tube tip in the proximal stomach with the side port in the distal esophagus. No evidence of obstruction or free air. Electronically Signed   By: Rolm Baptise M.D.   On: 09/19/2015 11:45   Dg Abd Portable 1v  09/18/2015  CLINICAL DATA:  Abdominal pain for 1 day EXAM: PORTABLE ABDOMEN - 1 VIEW COMPARISON:   08/26/2015 FINDINGS: Scattered large and small bowel gas is noted. No obstructive changes are noted. Postoperative changes are seen. No free air is noted. No acute bony abnormality is noted. IMPRESSION: No acute abnormality seen. Electronically Signed   By: Inez Catalina M.D.   On: 09/18/2015 18:12    CBC  Recent Labs Lab 09/18/15 1742 09/19/15 0547  WBC 9.4 11.6*  HGB 11.5* 11.5*  HCT 36.3* 37.7*  PLT 283 286  MCV 91.2 91.7  MCH 28.9 28.0  MCHC 31.7 30.5  RDW 19.0* 19.1*  LYMPHSABS 0.9  --   MONOABS 0.4  --   EOSABS 0.2  --   BASOSABS 0.0  --     Chemistries   Recent Labs Lab 09/18/15 1742 09/19/15 0547  NA 138 138  K 4.2 3.9  CL 102 104  CO2 21* 23  GLUCOSE 124* 133*  BUN 18 17  CREATININE 1.74* 1.60*  CALCIUM 9.1 8.5*  AST 42*  --   ALT 28  --   ALKPHOS 63  --   BILITOT 0.6  --    ------------------------------------------------------------------------------------------------------------------ estimated creatinine clearance is 36.9 mL/min (by C-G formula based on Cr of 1.6). ------------------------------------------------------------------------------------------------------------------ No results for input(s): HGBA1C in the last 72 hours. ------------------------------------------------------------------------------------------------------------------ No results for input(s): CHOL, HDL, LDLCALC, TRIG, CHOLHDL, LDLDIRECT in the last 72 hours. ------------------------------------------------------------------------------------------------------------------ No results for input(s): TSH, T4TOTAL, T3FREE, THYROIDAB in the last 72 hours.  Invalid input(s): FREET3 ------------------------------------------------------------------------------------------------------------------ No results for input(s): VITAMINB12, FOLATE, FERRITIN, TIBC, IRON, RETICCTPCT in the last 72 hours.  Coagulation profile No results for input(s): INR, PROTIME in the last 168 hours.  No  results for input(s): DDIMER in the last 72 hours.  Cardiac Enzymes  Recent Labs Lab 09/18/15 1742  TROPONINI <0.03   ------------------------------------------------------------------------------------------------------------------ Invalid input(s): POCBNP   Recent Labs  09/19/15 0105 09/19/15 0645  GLUCAP 111* 123*     Jedidiah Demartini M.D. [facility] 09/19/2015, 12:08 PM  Pager: [phone] Between 7am to 7pm - call Pager - [phone]  After 7pm go to [email or web address] - password [email or web address]  Call night coverage person covering after 7pm

## 2015-09-19 NOTE — Consult Note (Signed)
[facility] Consult Note  Referring Provider: No ref. provider found Primary Care Physician:  No primary care provider on file. Primary Gastroenterologist:  Dr.  Laurel Dimmer Complaint: Abdominal pain HPI: Logan Day is an 82 y.o. white male  presents with acute onset of abdominal pain nausea and vomiting. He has chronic dementia and is unable to give good history. He was just in the hospital a month ago with GI bleeding in the setting of over anticoagulation. Conservative management was recommended in no endoscopic evaluation done. A colonoscopy in 2009 which showed pandiverticulosis. Currently his CT scan shows abrupt obstruction of the ileocecal junction with submucosal edema, no evident cecal mass, multiple diverticuli, study limited by lack of contrast. NG tube has been placed. Surgery has been consulted.  Past Medical History  Diagnosis Date  . Hypertension   . Atrial fibrillation ([place])   . Hypercholesteremia   . MI (myocardial infarction) ([place])     x 2  . Angina   . Cancer [facility])     prostate cancer  . Coronary artery disease     Circ stent (2009 cath patent)  . Prostate cancer ([place])   . Hyperlipidemia   . TIA (transient ischemic attack)   . GERD (gastroesophageal reflux disease)   . Shortness of breath dyspnea   . COPD (chronic obstructive pulmonary disease) ([place])   . Dementia   . Congestive heart failure ([place])   . GI bleed 08/2015    Past Surgical History  Procedure Laterality Date  . Prostayectomy    . Fracture surgery    . Coronary angioplasty with stent placement  10/19/2012    DES  to circumflex  . Percutaneous coronary stent intervention (pci-s) N/A 10/19/2012    Procedure: PERCUTANEOUS CORONARY STENT INTERVENTION (PCI-S);  Surgeon: Sinclair Grooms, MD;  Location: [facility] CATH LAB;  Service: Cardiovascular;  Laterality: N/A;    Medications Prior to Admission  Medication Sig Dispense Refill  . acetaminophen (TYLENOL) 500 MG tablet Take 500 mg by mouth 2  (two) times daily.    [name] albuterol (PROAIR HFA) 108 (90 BASE) MCG/ACT inhaler Inhale 1-2 puffs into the lungs every 6 (six) hours as needed for wheezing or shortness of breath. 1 Inhaler 3  . amiodarone (PACERONE) 200 MG tablet Take 1 tablet (200 mg total) by mouth daily. 30 tablet 5  . aspirin EC 81 MG tablet Take 1 tablet (81 mg total) by mouth daily. Resume on 09/11/15    . bisacodyl (DULCOLAX) 5 MG EC tablet Take 5 mg by mouth daily as needed for moderate constipation.    [name] buPROPion (WELLBUTRIN SR) 150 MG 12 hr tablet Take 150 mg by mouth 2 (two) times daily.     . feeding supplement (BOOST / RESOURCE BREEZE) LIQD Take 1 Container by mouth 3 (three) times daily between meals. 90 Container 0  . ferrous sulfate 325 (65 FE) MG tablet Take 1 tablet (325 mg total) by mouth 2 (two) times daily with a meal. 90 tablet 0  . nitroGLYCERIN (NITROSTAT) 0.4 MG SL tablet Place 0.4 mg under the tongue every 5 (five) minutes as needed for chest pain.    [name] ALPRAZolam (XANAX) 0.5 MG tablet Take 1 tablet (0.5 mg total) by mouth 2 (two) times daily as needed for anxiety. 30 tablet 0  . pantoprazole (PROTONIX) 40 MG tablet Take 1 tablet (40 mg total) by mouth daily at 12 noon. 30 tablet 0  . pravastatin (PRAVACHOL) 40 MG tablet Take 40 mg by  mouth every evening.    . sertraline (ZOLOFT) 100 MG tablet Take 100 mg by mouth every evening.    . traZODone (DESYREL) 50 MG tablet Take 50 mg by mouth at bedtime.      Allergies: No Known Allergies  Family History  Problem Relation Age of Onset  . Coronary artery disease Son   . Heart disease Mother   . Heart disease Father     Social History:  reports that he has quit smoking. He has quit using smokeless tobacco. His smokeless tobacco use included Chew. He reports that he does not drink alcohol or use illicit drugs.  Review of Systems: negative except not relatively obtainable   Blood pressure 108/64, pulse 53, temperature 99 F (37.2 C), temperature source Oral,  resp. rate 18, height 5' 11"  (1.803 m), weight 70.806 kg (156 lb 1.6 oz), SpO2 99 %. Head: Normocephalic, without obvious abnormality, atraumatic Neck: no adenopathy, no carotid bruit, no JVD, supple, symmetrical, trachea midline and thyroid not enlarged, symmetric, no tenderness/mass/nodules Resp: clear to auscultation bilaterally Cardio: regular rate and rhythm, S1, S2 normal, no murmur, click, rub or gallop GI: Abdomen not distended decreased bowel sounds diffuse inconsistent tenderness throughout Extremities: extremities normal, atraumatic, no cyanosis or edema  Results for orders placed or performed during the hospital encounter of 09/18/15 (from the past 48 hour(s))  CBC with Differential/Platelet     Status: Abnormal   Collection Time: 09/18/15  5:42 PM  Result Value Ref Range   WBC 9.4 4.0 - 10.5 K/uL   RBC 3.98 (L) 4.22 - 5.81 MIL/uL   Hemoglobin 11.5 (L) 13.0 - 17.0 g/dL   HCT 36.3 (L) 39.0 - 52.0 %   MCV 91.2 78.0 - 100.0 fL   MCH 28.9 26.0 - 34.0 pg   MCHC 31.7 30.0 - 36.0 g/dL   RDW 19.0 (H) 11.5 - 15.5 %   Platelets 283 150 - 400 K/uL   Neutrophils Relative % 85 %   Neutro Abs 8.0 (H) 1.7 - 7.7 K/uL   Lymphocytes Relative 9 %   Lymphs Abs 0.9 0.7 - 4.0 K/uL   Monocytes Relative 4 %   Monocytes Absolute 0.4 0.1 - 1.0 K/uL   Eosinophils Relative 2 %   Eosinophils Absolute 0.2 0.0 - 0.7 K/uL   Basophils Relative 0 %   Basophils Absolute 0.0 0.0 - 0.1 K/uL  Comprehensive metabolic panel     Status: Abnormal   Collection Time: 09/18/15  5:42 PM  Result Value Ref Range   Sodium 138 135 - 145 mmol/L   Potassium 4.2 3.5 - 5.1 mmol/L   Chloride 102 101 - 111 mmol/L   CO2 21 (L) 22 - 32 mmol/L   Glucose, Bld 124 (H) 65 - 99 mg/dL   BUN 18 6 - 20 mg/dL   Creatinine, Ser 1.74 (H) 0.61 - 1.24 mg/dL   Calcium 9.1 8.9 - 10.3 mg/dL   Total Protein 7.3 6.5 - 8.1 g/dL   Albumin 3.4 (L) 3.5 - 5.0 g/dL   AST 42 (H) 15 - 41 U/L   ALT 28 17 - 63 U/L   Alkaline Phosphatase 63 38  - 126 U/L   Total Bilirubin 0.6 0.3 - 1.2 mg/dL   GFR calc non Af Amer 35 (L) >60 mL/min   GFR calc Af Amer 41 (L) >60 mL/min    Comment: (NOTE) The eGFR has been calculated using the CKD EPI equation. This calculation has not been validated in all clinical situations.  eGFR's persistently <60 mL/min signify possible Chronic Kidney Disease.    Anion gap 15 5 - 15  Lipase, blood     Status: None   Collection Time: 09/18/15  5:42 PM  Result Value Ref Range   Lipase 21 11 - 51 U/L  Troponin I     Status: None   Collection Time: 09/18/15  5:42 PM  Result Value Ref Range   Troponin I <0.03 <0.031 ng/mL    Comment:        NO INDICATION OF MYOCARDIAL INJURY.   POC occult blood, ED Provider will collect     Status: None   Collection Time: 09/18/15  5:55 PM  Result Value Ref Range   Fecal Occult Bld NEGATIVE NEGATIVE  Urinalysis, Routine w reflex microscopic (not at [facility])     Status: Abnormal   Collection Time: 09/18/15  6:56 PM  Result Value Ref Range   Color, Urine YELLOW YELLOW   APPearance TURBID (A) CLEAR   Specific Gravity, Urine 1.012 1.005 - 1.030   pH 8.0 5.0 - 8.0   Glucose, UA NEGATIVE NEGATIVE mg/dL   Hgb urine dipstick NEGATIVE NEGATIVE   Bilirubin Urine NEGATIVE NEGATIVE   Ketones, ur 15 (A) NEGATIVE mg/dL   Protein, ur NEGATIVE NEGATIVE mg/dL   Nitrite POSITIVE (A) NEGATIVE   Leukocytes, UA TRACE (A) NEGATIVE  Urine microscopic-add on     Status: Abnormal   Collection Time: 09/18/15  6:56 PM  Result Value Ref Range   Squamous Epithelial / LPF 0-5 (A) NONE SEEN   WBC, UA 0-5 0 - 5 WBC/hpf   RBC / HPF 0-5 0 - 5 RBC/hpf   Bacteria, UA MANY (A) NONE SEEN   Urine-Other AMORPHOUS URATES/PHOSPHATES   I-Stat CG4 Lactic Acid, ED     Status: None   Collection Time: 09/18/15  8:54 PM  Result Value Ref Range   Lactic Acid, Venous 1.64 0.5 - 2.0 mmol/L  Glucose, capillary     Status: Abnormal   Collection Time: 09/19/15  1:05 AM  Result Value Ref Range    Glucose-Capillary 111 (H) 65 - 99 mg/dL   Comment 1 Notify RN    Comment 2 Document in Chart   MRSA PCR Screening     Status: None   Collection Time: 09/19/15  1:09 AM  Result Value Ref Range   MRSA by PCR NEGATIVE NEGATIVE    Comment:        The GeneXpert MRSA Assay (FDA approved for NASAL specimens only), is one component of a comprehensive MRSA colonization surveillance program. It is not intended to diagnose MRSA infection nor to guide or monitor treatment for MRSA infections.   Basic metabolic panel     Status: Abnormal   Collection Time: 09/19/15  5:47 AM  Result Value Ref Range   Sodium 138 135 - 145 mmol/L   Potassium 3.9 3.5 - 5.1 mmol/L   Chloride 104 101 - 111 mmol/L   CO2 23 22 - 32 mmol/L   Glucose, Bld 133 (H) 65 - 99 mg/dL   BUN 17 6 - 20 mg/dL   Creatinine, Ser 1.60 (H) 0.61 - 1.24 mg/dL   Calcium 8.5 (L) 8.9 - 10.3 mg/dL   GFR calc non Af Amer 39 (L) >60 mL/min   GFR calc Af Amer 45 (L) >60 mL/min    Comment: (NOTE) The eGFR has been calculated using the CKD EPI equation. This calculation has not been validated in all clinical situations. eGFR's persistently <60  mL/min signify possible Chronic Kidney Disease.    Anion gap 11 5 - 15  CBC     Status: Abnormal   Collection Time: 09/19/15  5:47 AM  Result Value Ref Range   WBC 11.6 (H) 4.0 - 10.5 K/uL   RBC 4.11 (L) 4.22 - 5.81 MIL/uL   Hemoglobin 11.5 (L) 13.0 - 17.0 g/dL   HCT 37.7 (L) 39.0 - 52.0 %   MCV 91.7 78.0 - 100.0 fL   MCH 28.0 26.0 - 34.0 pg   MCHC 30.5 30.0 - 36.0 g/dL   RDW 19.1 (H) 11.5 - 15.5 %   Platelets 286 150 - 400 K/uL  Glucose, capillary     Status: Abnormal   Collection Time: 09/19/15  6:45 AM  Result Value Ref Range   Glucose-Capillary 123 (H) 65 - 99 mg/dL   Ct Abdomen Pelvis Wo Contrast  09/18/2015  CLINICAL DATA:  82 year old male with left flank pain radiating into the left lower quadrant with associated vomiting EXAM: CT ABDOMEN AND PELVIS WITHOUT CONTRAST TECHNIQUE:  Multidetector CT imaging of the abdomen and pelvis was performed following the standard protocol without IV contrast. COMPARISON:  Prior CT scan of the chest including the upper abdomen 11/30/2014 FINDINGS: Lower Chest: Small right-sided pleural effusion and associated right lower lobe atelectasis. Background of emphysema and chronic interstitial prominence. The lungs are hyperinflated. Borderline cardiomegaly. No pericardial effusion. Unremarkable imaged distal thoracic esophagus. Abdomen: Unenhanced CT was performed per clinician order. Lack of IV contrast limits sensitivity and specificity, especially for evaluation of abdominal/pelvic solid viscera. Within these limitations, unremarkable CT appearance of the stomach, duodenum, spleen, adrenal glands and pancreas. Normal hepatic contour morphology. No discrete hepatic lesion. Small stones layer within the gallbladder lumen. No intra or extrahepatic biliary ductal dilatation. Nonobstructing nephrolithiasis bilaterally. Nonspecific perinephric stranding bilaterally. No evidence of hydronephrosis. Multiple loops of dilated and fluid-filled small bowel throughout the abdomen. The source of obstruction appears to be the ileocecal junction. There is diffuse submucosal edema of the cecum in the region of the ileocecal junction. Evaluations significantly limited in the absence of intravenous contrast material. The appendix is identified and is normal. There is associated mesenteric edema and small volume ascites. Colonic diverticular disease without CT evidence of active inflammation. Pelvis: Surgical changes of prior prostatectomy. The bladder is unremarkable. Musculoskeletal: No acute fracture or aggressive appearing lytic or blastic osseous lesion. Vascular: Limited evaluation in the absence of intravenous contrast. Moderate atherosclerotic vascular calcifications without aneurysm. IMPRESSION: 1. High-grade distal small bowel obstruction. The location of the  obstruction appears to be at the ileocecal junction were there is diffuse submucosal edema involving the cecum. Differential considerations include terminal ileitis, typhlitis with secondary obstruction, and potentially an obstructing ileocecal mass. Evaluation is limited in the absence of intravenous contrast. There is associated mesenteric edema and small volume ascites related to this process. 2. Bilateral nonobstructing nephrolithiasis. 3. The appendix is identified and is normal. 4. Colonic diverticular disease without CT evidence of active inflammation. 5. Pulmonary emphysema and chronic interstitial prominence. 6. Small right-sided pleural effusion. 7. Borderline cardiomegaly. 8. Cholelithiasis without evidence of acute cholecystitis. 9. Nonspecific perinephric stranding bilaterally consistent with the clinical history of renal dysfunction. 10. Surgical changes of prior prostatectomy. 11. Atherosclerotic vascular calcifications. Electronically Signed   By: Jacqulynn Cadet M.D.   On: 09/18/2015 20:50   Dg Chest Portable 1 View  09/18/2015  CLINICAL DATA:  Generalized abdominal pain. EXAM: PORTABLE CHEST 1 VIEW COMPARISON:  August 26, 2015 FINDINGS: The cardiomediastinal  silhouette is stable. A torturous thoracic aorta is again identified. No pneumothorax. No pulmonary nodules, masses, or focal infiltrates. IMPRESSION: No active disease. Electronically Signed   By: Dorise Bullion III M.D   On: 09/18/2015 18:08   Dg Abd Portable 1v  09/18/2015  CLINICAL DATA:  Abdominal pain for 1 day EXAM: PORTABLE ABDOMEN - 1 VIEW COMPARISON:  08/26/2015 FINDINGS: Scattered large and small bowel gas is noted. No obstructive changes are noted. Postoperative changes are seen. No free air is noted. No acute bony abnormality is noted. IMPRESSION: No acute abnormality seen. Electronically Signed   By: Inez Catalina M.D.   On: 09/18/2015 18:12    Assessment: Apparent acute bowel obstruction at the ileocecal  junction. Plan:  Conservative management with NG suction for now. Other plans per surgery. Available to perform colonoscopy if felt helpful but overall suspicion of cecal mass would appear fairly low. We'll follow with you. Anyela Napierkowski C 09/19/2015, 10:12 AM  Pager [phone] If no answer or after 5 PM call [phone]

## 2015-09-19 NOTE — Progress Notes (Signed)
Subjective: He says he feels better, not distended, but still sore.  Nothing in the NG cannister so I have tube working now.     Objective: Vital signs in last 24 hours: Temp:  [98.4 F (36.9 C)-98.8 F (37.1 C)] 98.4 F (36.9 C) (02/17 0455) Pulse Rate:  [48-66] 58 (02/17 0455) Resp:  [11-21] 18 (02/17 0455) BP: (122-167)/(47-105) 143/68 mmHg (02/17 0455) SpO2:  [86 %-100 %] 98 % (02/17 0455) Weight:  [70.806 kg (156 lb 1.6 oz)] 70.806 kg (156 lb 1.6 oz) (02/17 0037)  Nothing reported from NG,  250 urine reported Afebrile, Bradycardic HR in the 50's  BP OK Creatinine 1.6 WBC 11.6 Urine culture pending CT last PM:  High-grade distal small bowel obstruction. The location of the obstruction appears to be at the ileocecal junction were there is diffuse submucosal edema involving the cecum. Differential considerations include terminal ileitis, typhlitis with secondary obstruction, and potentially an obstructing ileocecal mass. Evaluation is limited in the absence of intravenous contrast. There is associated mesenteric edema and small volume ascites related to this process.  Intake/Output from previous day: 02/16 0701 - 02/17 0700 In: 141.3 [I.V.:91.3; IV Piggyback:50] Out: 250 [Urine:250] Intake/Output this shift:    General appearance: alert, cooperative, no distress and feels better, but still sore. GI: soft, not distended this AM.  still sore.  Lab Results:   Recent Labs  09/18/15 1742 09/19/15 0547  WBC 9.4 11.6*  HGB 11.5* 11.5*  HCT 36.3* 37.7*  PLT 283 286    BMET  Recent Labs  09/18/15 1742 09/19/15 0547  NA 138 138  K 4.2 3.9  CL 102 104  CO2 21* 23  GLUCOSE 124* 133*  BUN 18 17  CREATININE 1.74* 1.60*  CALCIUM 9.1 8.5*   PT/INR No results for input(s): LABPROT, INR in the last 72 hours.   Recent Labs Lab 09/18/15 1742  AST 42*  ALT 28  ALKPHOS 63  BILITOT 0.6  PROT 7.3  ALBUMIN 3.4*     Lipase     Component Value Date/Time    LIPASE 21 09/18/2015 1742     Studies/Results: Ct Abdomen Pelvis Wo Contrast  09/18/2015  CLINICAL DATA:  82 year old male with left flank pain radiating into the left lower quadrant with associated vomiting EXAM: CT ABDOMEN AND PELVIS WITHOUT CONTRAST TECHNIQUE: Multidetector CT imaging of the abdomen and pelvis was performed following the standard protocol without IV contrast. COMPARISON:  Prior CT scan of the chest including the upper abdomen 11/30/2014 FINDINGS: Lower Chest: Small right-sided pleural effusion and associated right lower lobe atelectasis. Background of emphysema and chronic interstitial prominence. The lungs are hyperinflated. Borderline cardiomegaly. No pericardial effusion. Unremarkable imaged distal thoracic esophagus. Abdomen: Unenhanced CT was performed per clinician order. Lack of IV contrast limits sensitivity and specificity, especially for evaluation of abdominal/pelvic solid viscera. Within these limitations, unremarkable CT appearance of the stomach, duodenum, spleen, adrenal glands and pancreas. Normal hepatic contour morphology. No discrete hepatic lesion. Small stones layer within the gallbladder lumen. No intra or extrahepatic biliary ductal dilatation. Nonobstructing nephrolithiasis bilaterally. Nonspecific perinephric stranding bilaterally. No evidence of hydronephrosis. Multiple loops of dilated and fluid-filled small bowel throughout the abdomen. The source of obstruction appears to be the ileocecal junction. There is diffuse submucosal edema of the cecum in the region of the ileocecal junction. Evaluations significantly limited in the absence of intravenous contrast material. The appendix is identified and is normal. There is associated mesenteric edema and small volume ascites. Colonic diverticular disease without  CT evidence of active inflammation. Pelvis: Surgical changes of prior prostatectomy. The bladder is unremarkable. Musculoskeletal: No acute fracture or  aggressive appearing lytic or blastic osseous lesion. Vascular: Limited evaluation in the absence of intravenous contrast. Moderate atherosclerotic vascular calcifications without aneurysm. IMPRESSION: 1. High-grade distal small bowel obstruction. The location of the obstruction appears to be at the ileocecal junction were there is diffuse submucosal edema involving the cecum. Differential considerations include terminal ileitis, typhlitis with secondary obstruction, and potentially an obstructing ileocecal mass. Evaluation is limited in the absence of intravenous contrast. There is associated mesenteric edema and small volume ascites related to this process. 2. Bilateral nonobstructing nephrolithiasis. 3. The appendix is identified and is normal. 4. Colonic diverticular disease without CT evidence of active inflammation. 5. Pulmonary emphysema and chronic interstitial prominence. 6. Small right-sided pleural effusion. 7. Borderline cardiomegaly. 8. Cholelithiasis without evidence of acute cholecystitis. 9. Nonspecific perinephric stranding bilaterally consistent with the clinical history of renal dysfunction. 10. Surgical changes of prior prostatectomy. 11. Atherosclerotic vascular calcifications. Electronically Signed   By: Jacqulynn Cadet M.D.   On: 09/18/2015 20:50   Dg Chest Portable 1 View  09/18/2015  CLINICAL DATA:  Generalized abdominal pain. EXAM: PORTABLE CHEST 1 VIEW COMPARISON:  August 26, 2015 FINDINGS: The cardiomediastinal silhouette is stable. A torturous thoracic aorta is again identified. No pneumothorax. No pulmonary nodules, masses, or focal infiltrates. IMPRESSION: No active disease. Electronically Signed   By: Dorise Bullion III M.D   On: 09/18/2015 18:08   Dg Abd Portable 1v  09/18/2015  CLINICAL DATA:  Abdominal pain for 1 day EXAM: PORTABLE ABDOMEN - 1 VIEW COMPARISON:  08/26/2015 FINDINGS: Scattered large and small bowel gas is noted. No obstructive changes are noted.  Postoperative changes are seen. No free air is noted. No acute bony abnormality is noted. IMPRESSION: No acute abnormality seen. Electronically Signed   By: Inez Catalina M.D.   On: 09/18/2015 18:12    Medications: . sodium chloride   Intravenous Once  . sodium chloride   Intravenous STAT  . cefTRIAXone (ROCEPHIN)  IV  1 g Intravenous Q24H   . dextrose 5 % and 0.9% NaCl 75 mL/hr at 09/19/15 0047   Prior to Admission medications   Medication Sig Start Date End Date Taking? Authorizing Provider  acetaminophen (TYLENOL) 500 MG tablet Take 500 mg by mouth 2 (two) times daily.   Yes Historical Provider, MD  albuterol (PROAIR HFA) 108 (90 BASE) MCG/ACT inhaler Inhale 1-2 puffs into the lungs every 6 (six) hours as needed for wheezing or shortness of breath. 05/06/15  Yes Pleas Koch, NP  amiodarone (PACERONE) 200 MG tablet Take 1 tablet (200 mg total) by mouth daily. 01/22/15  Yes Belva Crome, MD  aspirin EC 81 MG tablet Take 1 tablet (81 mg total) by mouth daily. Resume on 09/11/15 09/11/15  Yes Shanker Kristeen Mans, MD  bisacodyl (DULCOLAX) 5 MG EC tablet Take 5 mg by mouth daily as needed for moderate constipation.   Yes Historical Provider, MD  buPROPion (WELLBUTRIN SR) 150 MG 12 hr tablet Take 150 mg by mouth 2 (two) times daily.  08/01/14  Yes Historical Provider, MD  feeding supplement (BOOST / RESOURCE BREEZE) LIQD Take 1 Container by mouth 3 (three) times daily between meals. 08/28/15  Yes Shanker Kristeen Mans, MD  ferrous sulfate 325 (65 FE) MG tablet Take 1 tablet (325 mg total) by mouth 2 (two) times daily with a meal. 08/28/15  Yes Jonetta Osgood, MD  nitroGLYCERIN (NITROSTAT) 0.4 MG SL tablet Place 0.4 mg under the tongue every 5 (five) minutes as needed for chest pain.   Yes Historical Provider, MD  ALPRAZolam Duanne Moron) 0.5 MG tablet Take 1 tablet (0.5 mg total) by mouth 2 (two) times daily as needed for anxiety. 08/28/15   Shanker Kristeen Mans, MD  pantoprazole (PROTONIX) 40 MG tablet Take 1  tablet (40 mg total) by mouth daily at 12 noon. 08/28/15   Shanker Kristeen Mans, MD  pravastatin (PRAVACHOL) 40 MG tablet Take 40 mg by mouth every evening.    Historical Provider, MD  sertraline (ZOLOFT) 100 MG tablet Take 100 mg by mouth every evening.    Historical Provider, MD  traZODone (DESYREL) 50 MG tablet Take 50 mg by mouth at bedtime.    Historical Provider, MD     Assessment/Plan SBO at terminal ileum/cecum with possible mass Recent admit with hematochezia Hx of Atrial fibrillation previously on Xarelto Hx of MI/CAD/CHF/DES stent to Circ Hx of TIA Hx of COPD/tobacco use Hx of Dementia Prior prostatectomy Antibiotics:  None DVT:  SCD    Plan:  I will get a film this AM and tomorrow, he tasted water when I flushed it.   We will follow with you.  GI to see for possible cecal mass evaluation.     LOS: 1 day    Emmali Karow 09/19/2015

## 2015-09-20 LAB — CBC
HEMATOCRIT: 36.5 % — AB (ref 39.0–52.0)
HEMOGLOBIN: 11.2 g/dL — AB (ref 13.0–17.0)
MCH: 28.5 pg (ref 26.0–34.0)
MCHC: 30.7 g/dL (ref 30.0–36.0)
MCV: 92.9 fL (ref 78.0–100.0)
Platelets: 284 10*3/uL (ref 150–400)
RBC: 3.93 MIL/uL — ABNORMAL LOW (ref 4.22–5.81)
RDW: 19.7 % — ABNORMAL HIGH (ref 11.5–15.5)
WBC: 9.7 10*3/uL (ref 4.0–10.5)

## 2015-09-20 LAB — GLUCOSE, CAPILLARY
GLUCOSE-CAPILLARY: 76 mg/dL (ref 65–99)
Glucose-Capillary: 107 mg/dL — ABNORMAL HIGH (ref 65–99)

## 2015-09-20 MED ORDER — DIATRIZOATE MEGLUMINE & SODIUM 66-10 % PO SOLN: 90.0000 mL | Freq: Once | ORAL | Status: DC

## 2015-09-20 MED ORDER — LORAZEPAM 2 MG/ML IJ SOLN
1.0000 mg | Freq: Four times a day (QID) | INTRAMUSCULAR | Status: DC | PRN
  Administered 2015-09-20 – 2015-09-24 (×4): 1 mg via INTRAVENOUS
  Filled 2015-09-20 (×4): qty 1

## 2015-09-20 MED ORDER — HALOPERIDOL LACTATE 5 MG/ML IJ SOLN
5.0000 mg | Freq: Four times a day (QID) | INTRAMUSCULAR | Status: DC | PRN
Start: 2015-09-20 — End: 2015-09-20

## 2015-09-20 MED ORDER — MORPHINE SULFATE (PF) 2 MG/ML IV SOLN
1.0000 mg | INTRAVENOUS | Status: DC | PRN
  Administered 2015-09-20 – 2015-09-26 (×7): 1 mg via INTRAVENOUS
  Filled 2015-09-20 (×7): qty 1

## 2015-09-20 MED ORDER — DEXTROSE 50 % IV SOLN
INTRAVENOUS | Status: AC
  Administered 2015-09-20: 50 mL
  Filled 2015-09-20: qty 50

## 2015-09-20 MED ORDER — SODIUM CHLORIDE 0.9 % IV SOLN
INTRAVENOUS | Status: DC
  Administered 2015-09-20 – 2015-09-23 (×7): via INTRAVENOUS

## 2015-09-20 MED ORDER — SODIUM CHLORIDE 0.9 % IV SOLN
INTRAVENOUS | Status: DC
  Administered 2015-09-20: 06:00:00 via INTRAVENOUS

## 2015-09-20 NOTE — Progress Notes (Signed)
Follow up with the radiology department regarding NG tube placement and X-ray procedure, at this point they decided to make pt take contrast PO. But pt's is sleepy due to the effect of Ativan and refusing to take any contrast PO at this point, will try tomorrow am and Follow up with radiology Dept again based on the situation of the patient.

## 2015-09-20 NOTE — Progress Notes (Signed)
Pt pulled out NG tube this morning, MD aware and Surgery aware, there is a IR consultation for reinsertion of NG tube at this point and it is in progress, paged to surgery to consult radiologist, to the pt, provided ativan 1 mg and morphine 1 mg IV, pt looks less combative in compared to this morniong right now, will continue to monitor the patient

## 2015-09-20 NOTE — Progress Notes (Signed)
During pt's assessment this morning, pt's IV site found infiltrated, IV taken off, put the order of IV team consultation for new IV, pt is in condom cath but  and G tube is just 300 cc for the past 24 hours.

## 2015-09-20 NOTE — Progress Notes (Signed)
Patient ID: Logan Day, male   DOB: 17-May-1933, 82 y.o.   MRN: [id number] Notified by pharmacy of previous prolonged QT. Hold Haldol for now and check EKG. Georganna Skeans, MD, MPH, FACS Trauma: [phone] General Surgery: [phone]

## 2015-09-20 NOTE — Progress Notes (Signed)
Subjective: Confused this am and combative, not sure if this is baseline, refusing treatment, states no flatus/bm  Objective: Vital signs in last 24 hours: Temp:  [98.4 F (36.9 C)-98.9 F (37.2 C)] 98.9 F (37.2 C) (02/18 0455) Pulse Rate:  [54-62] 61 (02/18 0455) Resp:  [18] 18 (02/18 0455) BP: (116-146)/(47-74) 146/74 mmHg (02/18 0455) SpO2:  [91 %-99 %] 96 % (02/18 0455) Weight:  [70.489 kg (155 lb 6.4 oz)] 70.489 kg (155 lb 6.4 oz) (02/18 0455) Last BM Date: 09/18/15  Intake/Output from previous day: 02/17 0701 - 02/18 0700 In: 50 [IV Piggyback:50] Out: 300 [Emesis/NG output:300] Intake/Output this shift:    General appearance: mild distress GI: soft mild tenderness bilateral lower quadrants, not really distended  Lab Results:   Recent Labs  09/18/15 1742 09/19/15 0547  WBC 9.4 11.6*  HGB 11.5* 11.5*  HCT 36.3* 37.7*  PLT 283 286   BMET  Recent Labs  09/18/15 1742 09/19/15 0547  NA 138 138  K 4.2 3.9  CL 102 104  CO2 21* 23  GLUCOSE 124* 133*  BUN 18 17  CREATININE 1.74* 1.60*  CALCIUM 9.1 8.5*    Studies/Results: Ct Abdomen Pelvis Wo Contrast  09/18/2015  CLINICAL DATA:  82 year old male with left flank pain radiating into the left lower quadrant with associated vomiting EXAM: CT ABDOMEN AND PELVIS WITHOUT CONTRAST TECHNIQUE: Multidetector CT imaging of the abdomen and pelvis was performed following the standard protocol without IV contrast. COMPARISON:  Prior CT scan of the chest including the upper abdomen 11/30/2014 FINDINGS: Lower Chest: Small right-sided pleural effusion and associated right lower lobe atelectasis. Background of emphysema and chronic interstitial prominence. The lungs are hyperinflated. Borderline cardiomegaly. No pericardial effusion. Unremarkable imaged distal thoracic esophagus. Abdomen: Unenhanced CT was performed per clinician order. Lack of IV contrast limits sensitivity and specificity, especially for evaluation of  abdominal/pelvic solid viscera. Within these limitations, unremarkable CT appearance of the stomach, duodenum, spleen, adrenal glands and pancreas. Normal hepatic contour morphology. No discrete hepatic lesion. Small stones layer within the gallbladder lumen. No intra or extrahepatic biliary ductal dilatation. Nonobstructing nephrolithiasis bilaterally. Nonspecific perinephric stranding bilaterally. No evidence of hydronephrosis. Multiple loops of dilated and fluid-filled small bowel throughout the abdomen. The source of obstruction appears to be the ileocecal junction. There is diffuse submucosal edema of the cecum in the region of the ileocecal junction. Evaluations significantly limited in the absence of intravenous contrast material. The appendix is identified and is normal. There is associated mesenteric edema and small volume ascites. Colonic diverticular disease without CT evidence of active inflammation. Pelvis: Surgical changes of prior prostatectomy. The bladder is unremarkable. Musculoskeletal: No acute fracture or aggressive appearing lytic or blastic osseous lesion. Vascular: Limited evaluation in the absence of intravenous contrast. Moderate atherosclerotic vascular calcifications without aneurysm. IMPRESSION: 1. High-grade distal small bowel obstruction. The location of the obstruction appears to be at the ileocecal junction were there is diffuse submucosal edema involving the cecum. Differential considerations include terminal ileitis, typhlitis with secondary obstruction, and potentially an obstructing ileocecal mass. Evaluation is limited in the absence of intravenous contrast. There is associated mesenteric edema and small volume ascites related to this process. 2. Bilateral nonobstructing nephrolithiasis. 3. The appendix is identified and is normal. 4. Colonic diverticular disease without CT evidence of active inflammation. 5. Pulmonary emphysema and chronic interstitial prominence. 6. Small  right-sided pleural effusion. 7. Borderline cardiomegaly. 8. Cholelithiasis without evidence of acute cholecystitis. 9. Nonspecific perinephric stranding bilaterally consistent with the clinical history of  renal dysfunction. 10. Surgical changes of prior prostatectomy. 11. Atherosclerotic vascular calcifications. Electronically Signed   By: Jacqulynn Cadet M.D.   On: 09/18/2015 20:50   Dg Chest Portable 1 View  09/18/2015  CLINICAL DATA:  Generalized abdominal pain. EXAM: PORTABLE CHEST 1 VIEW COMPARISON:  August 26, 2015 FINDINGS: The cardiomediastinal silhouette is stable. A torturous thoracic aorta is again identified. No pneumothorax. No pulmonary nodules, masses, or focal infiltrates. IMPRESSION: No active disease. Electronically Signed   By: Dorise Bullion III M.D   On: 09/18/2015 18:08   Dg Abd 2 Views  09/19/2015  CLINICAL DATA:  Lower abdominal pain, small-bowel obstruction EXAM: ABDOMEN - 2 VIEW COMPARISON:  09/18/2015 FINDINGS: NG tube tip is in the proximal stomach with the side port in the distal esophagus. Nonobstructive bowel gas pattern. No free air organomegaly. No suspicious calcification. Surgical clips in the lower pelvis. IMPRESSION: NG tube tip in the proximal stomach with the side port in the distal esophagus. No evidence of obstruction or free air. Electronically Signed   By: Rolm Baptise M.D.   On: 09/19/2015 11:45   Dg Abd Portable 1v  09/18/2015  CLINICAL DATA:  Abdominal pain for 1 day EXAM: PORTABLE ABDOMEN - 1 VIEW COMPARISON:  08/26/2015 FINDINGS: Scattered large and small bowel gas is noted. No obstructive changes are noted. Postoperative changes are seen. No free air is noted. No acute bony abnormality is noted. IMPRESSION: No acute abnormality seen. Electronically Signed   By: Inez Catalina M.D.   On: 09/18/2015 18:12    Anti-infectives: Anti-infectives    Start     Dose/Rate Route Frequency Ordered Stop   09/18/15 2300  cefTRIAXone (ROCEPHIN) 1 g in dextrose 5 %  50 mL IVPB     1 g 100 mL/hr over 30 Minutes Intravenous Every 24 hours 09/18/15 2251     09/18/15 2115  cefTRIAXone (ROCEPHIN) 1 g in dextrose 5 % 50 mL IVPB     1 g 100 mL/hr over 30 Minutes Intravenous  Once 09/18/15 2106 09/18/15 2308      Assessment/Plan: SBO Not sure of etiology, gi does not want to do any endoscopy This certainly could be mass related at the ic junction but his films yesterday looked fairly normal His ng needs to be advanced but he is refusing right now Will check cbc this am Will try to give contrast via ng tube to see if he is obstructed or can manage conservatively He may require surgery for this but is in no state to consent to this, will need to discuss with family and should have conversation about goals of care  [facility] 09/20/2015

## 2015-09-20 NOTE — Progress Notes (Signed)
[facility] Progress Note  Subjective: The patient pulled his NG tube out this morning. He is not complaining of addominal pain at this time.  Objective: Vital signs in last 24 hours: Temp:  [98.4 F (36.9 C)-98.9 F (37.2 C)] 98.9 F (37.2 C) (02/18 0455) Pulse Rate:  [54-62] 62 (02/18 0900) Resp:  [18] 18 (02/18 0455) BP: (125-146)/(52-74) 140/60 mmHg (02/18 0900) SpO2:  [91 %-98 %] 97 % (02/18 0900) Weight:  [70.489 kg (155 lb 6.4 oz)] 70.489 kg (155 lb 6.4 oz) (02/18 0455) Weight change: -0.318 kg (-11.2 oz)   PE:  He is in no distress  Heart regular rhythm  Lungs clear  Abdomen is soft  Lab Results: Results for orders placed or performed during the hospital encounter of 09/18/15 (from the past 24 hour(s))  Glucose, capillary     Status: Abnormal   Collection Time: 09/19/15  5:08 PM  Result Value Ref Range   Glucose-Capillary 118 (H) 65 - 99 mg/dL  Glucose, capillary     Status: Abnormal   Collection Time: 09/19/15  9:19 PM  Result Value Ref Range   Glucose-Capillary 133 (H) 65 - 99 mg/dL  Glucose, capillary     Status: Abnormal   Collection Time: 09/20/15  6:22 AM  Result Value Ref Range   Glucose-Capillary 107 (H) 65 - 99 mg/dL  CBC     Status: Abnormal   Collection Time: 09/20/15 11:39 AM  Result Value Ref Range   WBC 9.7 4.0 - 10.5 K/uL   RBC 3.93 (L) 4.22 - 5.81 MIL/uL   Hemoglobin 11.2 (L) 13.0 - 17.0 g/dL   HCT 36.5 (L) 39.0 - 52.0 %   MCV 92.9 78.0 - 100.0 fL   MCH 28.5 26.0 - 34.0 pg   MCHC 30.7 30.0 - 36.0 g/dL   RDW 19.7 (H) 11.5 - 15.5 %   Platelets 284 150 - 400 K/uL    Studies/Results: No results found.    Assessment: Small bowel obstruction.  CT shows abnormality at the cecum and ileocecal valve area  Plan:   Continue conservative management for now.    SAM F Yerick Eggebrecht 09/20/2015, 12:40 PM  Pager: [phone] If no answer or after 5 PM call [phone]

## 2015-09-20 NOTE — Progress Notes (Signed)
Patient ID: Logan Day, male   DOB: Jun 14, 1933, 82 y.o.   MRN: [id number] I met with his son and we spoke with his other son on speaker phone. GI recs discussed. X-ray improved yesterday. Will evaluate further with SB protocol and see if obstruction is resolved. If he does not improve and he needs surgery, his sons would like to proceed with this. Add haldol PRN and mittens. Georganna Skeans, MD, MPH, FACS Trauma: [phone] General Surgery: [phone]

## 2015-09-20 NOTE — Progress Notes (Signed)
[facility]                                                                              Patient Demographics  Logan Day, is a 82 y.o. male, DOB - [birth year], VX:[id number]  Admit date - 09/18/2015   Admitting Physician Rise Patience, MD  Outpatient Primary MD for the patient is No primary care provider on file.  LOS - 2   Chief Complaint  Patient presents with  . Abdominal Pain       Brief HPI   Logan Day is a 82 y.o. male who was recently admitted for GI bleed and at that time patient's xarelto was discontinued presented to the ER because of abdominal pain and nausea vomiting. Patient reported that he had a large bowel movement this morning and later started developing abdominal pain in the lower quadrants with multiple episodes of nausea and vomiting. CT abdomen and pelvis shows high-grade bowel obstruction at the ileocecal junction. On-call surgeon Dr. Grandville Silos was consulted and the patient was placed on NG tube and admitted for further management.    Assessment & Plan     SBO (small bowel obstruction) (HCC) - Continue NPO status, IV fluid hydration, NG tube suction - Patient pulled out NGT, will have IR replace it due to difficult placement  - CT abdomen showed high-grade small bowel obstruction at the ED a cecal junction ? Edema versus mass - Gen. surgery following, recommended GI consult, GI following however no plans of colonoscopy at this time. - Discussed with patient's sons, who is agreeable with IR NGT placement and surgery if needed.    Essential hypertension - Currently stable, continue IV hydralazine as needed with parameters    PAF (paroxysmal atrial fibrillation) (HCC) - Currently rate controlled, mildly bradycardiac and 70 and drawn and metoprolol currently on hold - [place] vasc 7, however anticoagulation was discontinued during previous admission due to recent GI bleed - Discussed in detail with patient's son at the bedside,  understands the risk of possible CVA in future however no further anticoagulation     CKD (chronic kidney disease), stage II - Follow creatinine closely, currently at baseline    UTI (lower urinary tract infection) - Follow urine culture and sensitivities, continue Rocephin  Chronic systolic CHF - Currently compensated, echo 4/16  EF 40-45%, monitor respiratory status was IV fluids  COPD - Currently stable, not wheezing  Acute encephalopathy on Dementia - Continue to hold Wellbutrin, Xanax, Zoloft, trazodone for now. Placed on IV Ativan as needed, no Haldol due to QTC prolongation  Recent GI bleed - Currently H&H stable, continue IV PPI  Code Status: DO NOT RESUSCITATE   Family Communication: Discussed in detail with the patient, all imaging results, lab results explained to the patient's sons Mr. Jezreel Oh and [name],     Disposition Plan:   Time Spent in minutes  25 minutes  Procedures  CT abd NGT suction  Consults   Surgery GI   DVT Prophylaxis  SCD's  Medications  Scheduled Meds: . antiseptic oral rinse  7 mL Mouth Rinse q12n4p  . cefTRIAXone (ROCEPHIN)  IV  1 g Intravenous Q24H  . chlorhexidine  15 mL Mouth Rinse BID  . diatrizoate meglumine-sodium  90 mL Per NG tube Once  . pantoprazole (PROTONIX) IV  40 mg Intravenous Q12H   Continuous Infusions: . sodium chloride 100 mL/hr at 09/20/15 1030   PRN Meds:.acetaminophen **OR** acetaminophen, albuterol, hydrALAZINE, LORazepam, morphine injection, nitroGLYCERIN, ondansetron **OR** ondansetron (ZOFRAN) IV   Antibiotics   Anti-infectives    Start     Dose/Rate Route Frequency Ordered Stop   09/18/15 2300  cefTRIAXone (ROCEPHIN) 1 g in dextrose 5 % 50 mL IVPB     1 g 100 mL/hr over 30 Minutes Intravenous Every 24 hours 09/18/15 2251     09/18/15 2115  cefTRIAXone (ROCEPHIN) 1 g in dextrose 5 % 50 mL IVPB     1 g 100 mL/hr over 30 Minutes Intravenous  Once 09/18/15 2106 09/18/15 2308         Subjective:   Logan Day was seen and examined today.  Patient has dementia, currently combative and agitated, pulled out NGT.   Objective:   Filed Vitals:   09/19/15 1146 09/19/15 2000 09/20/15 0021 09/20/15 0455  BP: 122/60 125/52 127/59 146/74  Pulse: 55 54 62 61  Temp:  98.4 F (36.9 C) 98.4 F (36.9 C) 98.9 F (37.2 C)  TempSrc:  Oral Oral Oral  Resp: 18 18 18 18   Height:      Weight:    70.489 kg (155 lb 6.4 oz)  SpO2: 96% 98% 91% 96%    Intake/Output Summary (Last 24 hours) at 09/20/15 1121 Last data filed at 09/19/15 2208  Gross per 24 hour  Intake     50 ml  Output    300 ml  Net   -250 ml     Wt Readings from Last 3 Encounters:  09/20/15 70.489 kg (155 lb 6.4 oz)  08/27/15 71.3 kg (157 lb 3 oz)  06/10/15 71.668 kg (158 lb)     Exam  General: Alert and oriented x self, agitated  HEENT:    Neck: Supple, no JVD  CVS: S1 S2 clear Regular rate and rhythm.  Respiratory: Decreased breath sounds at the bases  Abdomen: Soft, mildly tender, hypoactive bowel sounds  Ext: no cyanosis clubbing or edema  Neuro: moving all 4 extremities  Skin: No rashes  Psych: Normal affect and demeanor, alert and oriented self   Data Review   Micro Results Recent Results (from the past 240 hour(s))  MRSA PCR Screening     Status: None   Collection Time: 09/19/15  1:09 AM  Result Value Ref Range Status   MRSA by PCR NEGATIVE NEGATIVE Final    Comment:        The GeneXpert MRSA Assay (FDA approved for NASAL specimens only), is one component of a comprehensive MRSA colonization surveillance program. It is not intended to diagnose MRSA infection nor to guide or monitor treatment for MRSA infections.     Radiology Reports Ct Abdomen Pelvis Wo Contrast  09/18/2015  CLINICAL DATA:  82 year old male with left flank pain radiating into the left lower quadrant with associated vomiting EXAM: CT ABDOMEN AND PELVIS WITHOUT CONTRAST TECHNIQUE:  Multidetector CT imaging of the abdomen and pelvis was performed following the standard protocol without IV contrast. COMPARISON:  Prior CT scan of the chest including the upper abdomen 11/30/2014 FINDINGS: Lower Chest: Small right-sided pleural effusion and associated right lower lobe atelectasis. Background of emphysema and chronic interstitial prominence. The lungs are hyperinflated. Borderline cardiomegaly.  No pericardial effusion. Unremarkable imaged distal thoracic esophagus. Abdomen: Unenhanced CT was performed per clinician order. Lack of IV contrast limits sensitivity and specificity, especially for evaluation of abdominal/pelvic solid viscera. Within these limitations, unremarkable CT appearance of the stomach, duodenum, spleen, adrenal glands and pancreas. Normal hepatic contour morphology. No discrete hepatic lesion. Small stones layer within the gallbladder lumen. No intra or extrahepatic biliary ductal dilatation. Nonobstructing nephrolithiasis bilaterally. Nonspecific perinephric stranding bilaterally. No evidence of hydronephrosis. Multiple loops of dilated and fluid-filled small bowel throughout the abdomen. The source of obstruction appears to be the ileocecal junction. There is diffuse submucosal edema of the cecum in the region of the ileocecal junction. Evaluations significantly limited in the absence of intravenous contrast material. The appendix is identified and is normal. There is associated mesenteric edema and small volume ascites. Colonic diverticular disease without CT evidence of active inflammation. Pelvis: Surgical changes of prior prostatectomy. The bladder is unremarkable. Musculoskeletal: No acute fracture or aggressive appearing lytic or blastic osseous lesion. Vascular: Limited evaluation in the absence of intravenous contrast. Moderate atherosclerotic vascular calcifications without aneurysm. IMPRESSION: 1. High-grade distal small bowel obstruction. The location of the  obstruction appears to be at the ileocecal junction were there is diffuse submucosal edema involving the cecum. Differential considerations include terminal ileitis, typhlitis with secondary obstruction, and potentially an obstructing ileocecal mass. Evaluation is limited in the absence of intravenous contrast. There is associated mesenteric edema and small volume ascites related to this process. 2. Bilateral nonobstructing nephrolithiasis. 3. The appendix is identified and is normal. 4. Colonic diverticular disease without CT evidence of active inflammation. 5. Pulmonary emphysema and chronic interstitial prominence. 6. Small right-sided pleural effusion. 7. Borderline cardiomegaly. 8. Cholelithiasis without evidence of acute cholecystitis. 9. Nonspecific perinephric stranding bilaterally consistent with the clinical history of renal dysfunction. 10. Surgical changes of prior prostatectomy. 11. Atherosclerotic vascular calcifications. Electronically Signed   By: Jacqulynn Cadet M.D.   On: 09/18/2015 20:50   Dg Chest 2 View  08/26/2015  CLINICAL DATA:  Headache, prostate cancer, pale EXAM: CHEST  2 VIEW COMPARISON:  05/19/2015 FINDINGS: Cardiomediastinal silhouette is stable. No acute infiltrate or pleural effusion. No pulmonary edema. Osteopenia and mild degenerative changes thoracic spine. Hyperinflation again noted. IMPRESSION: No active disease. Hyperinflation again noted. Osteopenia and mild degenerative changes thoracic spine. Electronically Signed   By: Lahoma Crocker M.D.   On: 08/26/2015 14:50   Dg Abd 1 View  08/26/2015  CLINICAL DATA:  82 year old male with constipation. EXAM: ABDOMEN - 1 VIEW COMPARISON:  None. FINDINGS: The bowel gas pattern is unremarkable. There is no evidence of bowel obstruction. A small amount of stool in the colon and rectum noted. Surgical clips in the pelvis are present. IMPRESSION: Unremarkable bowel gas pattern. Electronically Signed   By: Margarette Canada M.D.   On: 08/26/2015  18:48   Ct Head Wo Contrast  08/26/2015  CLINICAL DATA:  Altered mental status and headache today. Initial encounter. EXAM: CT HEAD WITHOUT CONTRAST TECHNIQUE: Contiguous axial images were obtained from the base of the skull through the vertex without intravenous contrast. COMPARISON:  Brain MRI 07/16/2011.  Head CT scan 07/15/2011. FINDINGS: The brain is atrophic with extensive chronic microvascular ischemic change. Remote bilateral posterior temporal and parietal infarcts are identified. No evidence of acute infarction, hemorrhage, mass lesion, midline shift or abnormal extra-axial fluid collection is seen. No pneumocephalus or hydrocephalus. The calvarium is intact. Mucous retention cysts or polyps right maxillary sinus noted. Mild ethmoid air cell disease is seen  on the right. IMPRESSION: No acute abnormality. Remote bilateral parietotemporal infarcts. Atrophy and chronic microvascular ischemic change. Electronically Signed   By: Inge Rise M.D.   On: 08/26/2015 16:24   Dg Chest Portable 1 View  09/18/2015  CLINICAL DATA:  Generalized abdominal pain. EXAM: PORTABLE CHEST 1 VIEW COMPARISON:  August 26, 2015 FINDINGS: The cardiomediastinal silhouette is stable. A torturous thoracic aorta is again identified. No pneumothorax. No pulmonary nodules, masses, or focal infiltrates. IMPRESSION: No active disease. Electronically Signed   By: Dorise Bullion III M.D   On: 09/18/2015 18:08   Dg Abd 2 Views  09/19/2015  CLINICAL DATA:  Lower abdominal pain, small-bowel obstruction EXAM: ABDOMEN - 2 VIEW COMPARISON:  09/18/2015 FINDINGS: NG tube tip is in the proximal stomach with the side port in the distal esophagus. Nonobstructive bowel gas pattern. No free air organomegaly. No suspicious calcification. Surgical clips in the lower pelvis. IMPRESSION: NG tube tip in the proximal stomach with the side port in the distal esophagus. No evidence of obstruction or free air. Electronically Signed   By: Rolm Baptise M.D.   On: 09/19/2015 11:45   Dg Abd Portable 1v  09/18/2015  CLINICAL DATA:  Abdominal pain for 1 day EXAM: PORTABLE ABDOMEN - 1 VIEW COMPARISON:  08/26/2015 FINDINGS: Scattered large and small bowel gas is noted. No obstructive changes are noted. Postoperative changes are seen. No free air is noted. No acute bony abnormality is noted. IMPRESSION: No acute abnormality seen. Electronically Signed   By: Inez Catalina M.D.   On: 09/18/2015 18:12    CBC  Recent Labs Lab 09/18/15 1742 09/19/15 0547  WBC 9.4 11.6*  HGB 11.5* 11.5*  HCT 36.3* 37.7*  PLT 283 286  MCV 91.2 91.7  MCH 28.9 28.0  MCHC 31.7 30.5  RDW 19.0* 19.1*  LYMPHSABS 0.9  --   MONOABS 0.4  --   EOSABS 0.2  --   BASOSABS 0.0  --     Chemistries   Recent Labs Lab 09/18/15 1742 09/19/15 0547  NA 138 138  K 4.2 3.9  CL 102 104  CO2 21* 23  GLUCOSE 124* 133*  BUN 18 17  CREATININE 1.74* 1.60*  CALCIUM 9.1 8.5*  AST 42*  --   ALT 28  --   ALKPHOS 63  --   BILITOT 0.6  --    ------------------------------------------------------------------------------------------------------------------ estimated creatinine clearance is 36.7 mL/min (by C-G formula based on Cr of 1.6). ------------------------------------------------------------------------------------------------------------------ No results for input(s): HGBA1C in the last 72 hours. ------------------------------------------------------------------------------------------------------------------ No results for input(s): CHOL, HDL, LDLCALC, TRIG, CHOLHDL, LDLDIRECT in the last 72 hours. ------------------------------------------------------------------------------------------------------------------ No results for input(s): TSH, T4TOTAL, T3FREE, THYROIDAB in the last 72 hours.  Invalid input(s): FREET3 ------------------------------------------------------------------------------------------------------------------ No results for input(s): VITAMINB12,  FOLATE, FERRITIN, TIBC, IRON, RETICCTPCT in the last 72 hours.  Coagulation profile No results for input(s): INR, PROTIME in the last 168 hours.  No results for input(s): DDIMER in the last 72 hours.  Cardiac Enzymes  Recent Labs Lab 09/18/15 1742  TROPONINI <0.03   ------------------------------------------------------------------------------------------------------------------ Invalid input(s): POCBNP   Recent Labs  09/19/15 0105 09/19/15 0645 09/19/15 1143 09/19/15 1708 09/19/15 2119 09/20/15 0622  GLUCAP 111* 123* 123* 118* 133* 107*     RAI,RIPUDEEP M.D. [facility] 09/20/2015, 11:21 AM  Pager: [phone] Between 7am to 7pm - call Pager - [phone]  After 7pm go to [email or web address] - password [email or web address]  Call night coverage person covering after 7pm

## 2015-09-21 ENCOUNTER — Inpatient Hospital Stay (HOSPITAL_COMMUNITY): Payer: Medicare Other

## 2015-09-21 DIAGNOSIS — I82402 Acute embolism and thrombosis of unspecified deep veins of left lower extremity: Secondary | ICD-10-CM

## 2015-09-21 LAB — BASIC METABOLIC PANEL
ANION GAP: 9 (ref 5–15)
BUN: 19 mg/dL (ref 6–20)
CALCIUM: 8.3 mg/dL — AB (ref 8.9–10.3)
CO2: 24 mmol/L (ref 22–32)
CREATININE: 1.59 mg/dL — AB (ref 0.61–1.24)
Chloride: 111 mmol/L (ref 101–111)
GFR, EST AFRICAN AMERICAN: 46 mL/min — AB (ref >60)
GFR, EST NON AFRICAN AMERICAN: 39 mL/min — AB (ref >60)
Glucose, Bld: 91 mg/dL (ref 65–99)
Potassium: 3.5 mmol/L (ref 3.5–5.1)
SODIUM: 144 mmol/L (ref 135–145)

## 2015-09-21 LAB — CBC
HCT: 35.4 % — ABNORMAL LOW (ref 39.0–52.0)
HEMOGLOBIN: 10.9 g/dL — AB (ref 13.0–17.0)
MCH: 28.6 pg (ref 26.0–34.0)
MCHC: 30.8 g/dL (ref 30.0–36.0)
MCV: 92.9 fL (ref 78.0–100.0)
PLATELETS: 248 10*3/uL (ref 150–400)
RBC: 3.81 MIL/uL — AB (ref 4.22–5.81)
RDW: 19.9 % — ABNORMAL HIGH (ref 11.5–15.5)
WBC: 7.9 10*3/uL (ref 4.0–10.5)

## 2015-09-21 LAB — GLUCOSE, CAPILLARY
GLUCOSE-CAPILLARY: 76 mg/dL (ref 65–99)
GLUCOSE-CAPILLARY: 73 mg/dL (ref 65–99)
GLUCOSE-CAPILLARY: 111 mg/dL — AB (ref 65–99)
GLUCOSE-CAPILLARY: 89 mg/dL (ref 65–99)
Glucose-Capillary: 77 mg/dL (ref 65–99)

## 2015-09-21 LAB — URINE CULTURE

## 2015-09-21 MED ORDER — HEPARIN (PORCINE) IN NACL 100-0.45 UNIT/ML-% IJ SOLN
1350.0000 [IU]/h | INTRAMUSCULAR | Status: DC
  Administered 2015-09-21: 1100 [IU]/h via INTRAVENOUS
  Filled 2015-09-21: qty 250

## 2015-09-21 MED ORDER — TAMSULOSIN HCL 0.4 MG PO CAPS
0.4000 mg | ORAL_CAPSULE | Freq: Every day | ORAL | Status: DC
  Administered 2015-09-21: 0.4 mg via ORAL
  Filled 2015-09-21: qty 1

## 2015-09-21 NOTE — Progress Notes (Signed)
*  Preliminary Results* Left upper extremity venous duplex completed. Left upper extremity is positive for acute deep vein thrombosis involving the left brachial vein. There is also evidence of superficial vein thrombosis involving the left cephalic vein just distal to the IV.   Preliminary results discussed with Margreta Journey, RN.  09/21/2015 3:42 PM  Maudry Mayhew, RVT, RDCS, RDMS

## 2015-09-21 NOTE — Progress Notes (Signed)
[facility]/Logan Day Progress Note    Subjective: Patient complaining of very dry mouth and want to have some water or liquids.  Says his abdomen hurts.  Objective: Vital signs in last 24 hours: Temp:  [98 F (36.7 C)-98.6 F (37 C)] 98.6 F (37 C) (02/19 0344) Pulse Rate:  [61-78] 75 (02/19 0344) Resp:  [18-20] 18 (02/19 0344) BP: (135-158)/(58-72) 135/58 mmHg (02/19 0344) SpO2:  [93 %-97 %] 93 % (02/19 0344) Weight:  [71.033 kg (156 lb 9.6 oz)] 71.033 kg (156 lb 9.6 oz) (02/19 0344) Last BM Date: 09/18/15  Intake/Output from previous day: 02/18 0701 - 02/19 0700 In: 0  Out: 375 [Urine:375] Intake/Output this shift: Total I/O In: -  Out: 575 [Urine:575]  General: No acute distress and asleep on arrival  Lungs: Clear  Abd: Soft, excellent bowel sounds.  Has no peritonitis.  Voluntarily guards, but does not seem to be in a lot of distress.  Extremities: No changes  Neuro: Seem appropriate.  Lab Results:  @LABLAST2 (wbc:2,hgb:2,hct:2,plt:2) BMET ) Recent Labs  10/02/2015 0547 09/21/15 0413  NA 138 144  K 3.9 3.5  CL 104 111  CO2 23 24  GLUCOSE 133* 91  BUN 17 19  CREATININE 1.60* 1.59*  CALCIUM 8.5* 8.3*   PT/INR No results for input(s): LABPROT, INR in the last 72 hours. ABG No results for input(s): PHART, HCO3 in the last 72 hours.  Invalid input(s): PCO2, PO2  Studies/Results: Dg Abd 2 Views  10/02/2015  CLINICAL DATA:  Lower abdominal pain, small-bowel obstruction EXAM: ABDOMEN - 2 VIEW COMPARISON:  09/18/2015 FINDINGS: NG tube tip is in the proximal stomach with the side port in the distal esophagus. Nonobstructive bowel gas pattern. No free air organomegaly. No suspicious calcification. Surgical clips in the lower pelvis. IMPRESSION: NG tube tip in the proximal stomach with the side port in the distal esophagus. No evidence of obstruction or free air. Electronically Signed   By: Rolm Baptise M.D.   On: Oct 02, 2015 11:45    Anti-infectives: Anti-infectives    Start     Dose/Rate Route Frequency Ordered Stop   09/18/15 2300  cefTRIAXone (ROCEPHIN) 1 g in dextrose 5 % 50 mL IVPB     1 g 100 mL/hr over 30 Minutes Intravenous Every 24 hours 09/18/15 2251     09/18/15 2115  cefTRIAXone (ROCEPHIN) 1 g in dextrose 5 % 50 mL IVPB     1 g 100 mL/hr over 30 Minutes Intravenous  Once 09/18/15 2106 09/18/15 2308      Assessment/Plan: s/p  Advance diet Clear liquids only  No need for surgical exploration at this point.  LOS: 3 days   Kathryne Eriksson. Dahlia Bailiff, MD, FACS [phone] [facility] 09/21/2015

## 2015-09-21 NOTE — Progress Notes (Signed)
ANTICOAGULATION CONSULT NOTE - Initial Consult  Pharmacy Consult for heparin Indication: DVT  No Known Allergies  Patient Measurements: Height: 5\' 11"  (180.3 cm) Weight: 156 lb 9.6 oz (71.033 kg) (bedscale) IBW/kg (Calculated) : 75.3   Vital Signs: Temp: 98.2 F (36.8 C) (02/19 1142) Temp Source: Oral (02/19 1142) BP: 143/70 mmHg (02/19 1142) Pulse Rate: 81 (02/19 1142)  Labs:  Recent Labs  09/18/15 1742 09/19/15 0547 09/20/15 1139 09/21/15 0413  HGB 11.5* 11.5* 11.2* 10.9*  HCT 36.3* 37.7* 36.5* 35.4*  PLT 283 286 284 248  CREATININE 1.74* 1.60*  --  1.59*  TROPONINI <0.03  --   --   --     Estimated Creatinine Clearance: 37.2 mL/min (by C-G formula based on Cr of 1.59).   Medical History: Past Medical History  Diagnosis Date  . Hypertension   . Atrial fibrillation ([place])   . Hypercholesteremia   . MI (myocardial infarction) ([place])     x 2  . Angina   . Cancer [facility])     prostate cancer  . Coronary artery disease     Circ stent (2009 cath patent)  . Prostate cancer ([place])   . Hyperlipidemia   . TIA (transient ischemic attack)   . GERD (gastroesophageal reflux disease)   . Shortness of breath dyspnea   . COPD (chronic obstructive pulmonary disease) ([place])   . Dementia   . Congestive heart failure ([place])   . GI bleed 08/2015    Medications:  Prescriptions prior to admission  Medication Sig Dispense Refill Last Dose  . acetaminophen (TYLENOL) 500 MG tablet Take 500 mg by mouth 2 (two) times daily.   09/18/2015 at Unknown time  . albuterol (PROAIR HFA) 108 (90 BASE) MCG/ACT inhaler Inhale 1-2 puffs into the lungs every 6 (six) hours as needed for wheezing or shortness of breath. 1 Inhaler 3 unknown  . amiodarone (PACERONE) 200 MG tablet Take 1 tablet (200 mg total) by mouth daily. 30 tablet 5 09/18/2015 at Unknown time  . aspirin EC 81 MG tablet Take 1 tablet (81 mg total) by mouth daily. Resume on 09/11/15   09/18/2015 at Unknown time  . bisacodyl (DULCOLAX) 5  MG EC tablet Take 5 mg by mouth daily as needed for moderate constipation.   09/17/2015 at Unknown time  . buPROPion (WELLBUTRIN SR) 150 MG 12 hr tablet Take 150 mg by mouth 2 (two) times daily.    09/18/2015 at Unknown time  . feeding supplement (BOOST / RESOURCE BREEZE) LIQD Take 1 Container by mouth 3 (three) times daily between meals. 90 Container 0 09/18/2015 at Unknown time  . ferrous sulfate 325 (65 FE) MG tablet Take 1 tablet (325 mg total) by mouth 2 (two) times daily with a meal. 90 tablet 0 09/18/2015 at Unknown time  . nitroGLYCERIN (NITROSTAT) 0.4 MG SL tablet Place 0.4 mg under the tongue every 5 (five) minutes as needed for chest pain.   prn  . ALPRAZolam (XANAX) 0.5 MG tablet Take 1 tablet (0.5 mg total) by mouth 2 (two) times daily as needed for anxiety. 30 tablet 0   . pantoprazole (PROTONIX) 40 MG tablet Take 1 tablet (40 mg total) by mouth daily at 12 noon. 30 tablet 0   . pravastatin (PRAVACHOL) 40 MG tablet Take 40 mg by mouth every evening.   08/26/2015 at Unknown time  . sertraline (ZOLOFT) 100 MG tablet Take 100 mg by mouth every evening.   08/25/2015 at Unknown time  . traZODone (DESYREL) 50  MG tablet Take 50 mg by mouth at bedtime.   08/25/2015 at Unknown time   Scheduled:  . antiseptic oral rinse  7 mL Mouth Rinse q12n4p  . cefTRIAXone (ROCEPHIN)  IV  1 g Intravenous Q24H  . chlorhexidine  15 mL Mouth Rinse BID  . diatrizoate meglumine-sodium  90 mL Per NG tube Once  . pantoprazole (PROTONIX) IV  40 mg Intravenous Q12H  . tamsulosin  0.4 mg Oral Daily    Assessment: 82 yo male with DVT to left upper extremity and superficial vein thrombosis to left cephalic vein to begin heparin per pharmacy. He is noted with a recent admission for GIB (08/26/15) and was on xarelto at that time for afib. No endoscopy or colonoscopy at that time. -Hg= 10.9, plt= 248  Goal of Therapy:  Heparin level= 0.3-0.5 given recent GIB Monitor platelets by anticoagulation protocol: Yes   Plan:   -Begin heparin at 1100 units/hr -Heparin level in 8 hours and daily wth CBC daily  Hildred Laser, Pharm D 09/21/2015 4:00 PM

## 2015-09-21 NOTE — Progress Notes (Signed)
[facility] Progress Note  Subjective: He denies abdominal pain. He says he is passing some flatus. Surgery is following.  Objective: Vital signs in last 24 hours: Temp:  [98 F (36.7 C)-98.6 F (37 C)] 98.6 F (37 C) (02/19 0344) Pulse Rate:  [61-78] 75 (02/19 0344) Resp:  [18-20] 18 (02/19 0344) BP: (135-158)/(58-72) 135/58 mmHg (02/19 0344) SpO2:  [93 %-97 %] 93 % (02/19 0344) Weight:  [71.033 kg (156 lb 9.6 oz)] 71.033 kg (156 lb 9.6 oz) (02/19 0344) Weight change: 0.544 kg (1 lb 3.2 oz)   PE:  No distress  Heart regular rhythm  Lungs clear  Abdomen: Bowel sounds present, soft, flat, nontender  Lab Results: Results for orders placed or performed during the hospital encounter of 09/18/15 (from the past 24 hour(s))  CBC     Status: Abnormal   Collection Time: 09/20/15 11:39 AM  Result Value Ref Range   WBC 9.7 4.0 - 10.5 K/uL   RBC 3.93 (L) 4.22 - 5.81 MIL/uL   Hemoglobin 11.2 (L) 13.0 - 17.0 g/dL   HCT 36.5 (L) 39.0 - 52.0 %   MCV 92.9 78.0 - 100.0 fL   MCH 28.5 26.0 - 34.0 pg   MCHC 30.7 30.0 - 36.0 g/dL   RDW 19.7 (H) 11.5 - 15.5 %   Platelets 284 150 - 400 K/uL  Glucose, capillary     Status: None   Collection Time: 09/20/15  9:48 PM  Result Value Ref Range   Glucose-Capillary 76 65 - 99 mg/dL   Comment 1 Notify RN    Comment 2 Document in Chart   Glucose, capillary     Status: None   Collection Time: 09/21/15  3:39 AM  Result Value Ref Range   Glucose-Capillary 76 65 - 99 mg/dL  Basic metabolic panel     Status: Abnormal   Collection Time: 09/21/15  4:13 AM  Result Value Ref Range   Sodium 144 135 - 145 mmol/L   Potassium 3.5 3.5 - 5.1 mmol/L   Chloride 111 101 - 111 mmol/L   CO2 24 22 - 32 mmol/L   Glucose, Bld 91 65 - 99 mg/dL   BUN 19 6 - 20 mg/dL   Creatinine, Ser 1.59 (H) 0.61 - 1.24 mg/dL   Calcium 8.3 (L) 8.9 - 10.3 mg/dL   GFR calc non Af Amer 39 (L) >60 mL/min   GFR calc Af Amer 46 (L) >60 mL/min   Anion gap 9 5 - 15  CBC      Status: Abnormal   Collection Time: 09/21/15  4:13 AM  Result Value Ref Range   WBC 7.9 4.0 - 10.5 K/uL   RBC 3.81 (L) 4.22 - 5.81 MIL/uL   Hemoglobin 10.9 (L) 13.0 - 17.0 g/dL   HCT 35.4 (L) 39.0 - 52.0 %   MCV 92.9 78.0 - 100.0 fL   MCH 28.6 26.0 - 34.0 pg   MCHC 30.8 30.0 - 36.0 g/dL   RDW 19.9 (H) 11.5 - 15.5 %   Platelets 248 150 - 400 K/uL  Glucose, capillary     Status: None   Collection Time: 09/21/15  5:32 AM  Result Value Ref Range   Glucose-Capillary 73 65 - 99 mg/dL    Studies/Results: No results found.    Assessment: Small bowel obstruction  Plan:   Continue to follow clinically. We will see what surgery he has to say.    Cassell Clement 09/21/2015, 9:43 AM  Pager: [phone] If no  answer or after 5 PM call [phone]

## 2015-09-21 NOTE — Progress Notes (Signed)
[facility]                                                                              Patient Demographics  Logan Day, is a 82 y.o. male, DOB - 01-19-1933, VX:[id number]  Admit date - 09/18/2015   Admitting Physician Rise Patience, MD  Outpatient Primary MD for the patient is No primary care provider on file.  LOS - 3   Chief Complaint  Patient presents with  . Abdominal Pain       Brief HPI   [name] is a 82 y.o. male who was recently admitted for GI bleed and at that time patient's xarelto was discontinued presented to the ER because of abdominal pain and nausea vomiting. Patient reported that he had a large bowel movement this morning and later started developing abdominal pain in the lower quadrants with multiple episodes of nausea and vomiting. CT abdomen and pelvis shows high-grade bowel obstruction at the ileocecal junction. On-call surgeon Dr. Grandville Silos was consulted and the patient was placed on NG tube and admitted for further management.    Assessment & Plan     SBO (small bowel obstruction) ([place]) -- Patient had pulled NG tube yesterday, continue IV fluid hydration  -  CT abdomen showed high-grade small bowel obstruction at the ED a cecal junction ? Edema versus mass - Gen. surgery following, started on clears - GI also following, no plans of colonoscopy    Essential hypertension - Currently stable, continue IV hydralazine as needed with parameters    PAF (paroxysmal atrial fibrillation) ([facility]) - Currently rate controlled,  - [place] vasc 7, however anticoagulation was discontinued during previous admission due to recent GI bleed - Discussed in detail with patient's son at the bedside, understands the risk of possible CVA in future however no further anticoagulation     CKD (chronic kidney disease), stage II - Follow creatinine closely, currently at baseline    Gram-negative rods UTI (lower urinary tract infection) - Follow  urine culture and sensitivities, continue Rocephin  Chronic systolic CHF - Currently compensated, echo 4/16  EF 40-45%, monitor respiratory status on IV fluids  COPD - Currently stable, not wheezing  Acute encephalopathy on Dementia - Likely worsened due to UTI and SBO - Continue to hold Wellbutrin, Xanax, Zoloft, trazodone for now. Placed on IV Ativan as needed, no Haldol due to QTC prolongation  Recent GI bleed - Currently H&H stable, continue IV PPI  Urinary retention - Placed in and out cath, still having urinary retention, place Foley catheter - Placed on Flomax  Code Status: DO NOT RESUSCITATE   Family Communication: No family members at the bedside   Disposition Plan:   Time Spent in minutes  15 minutes  Procedures  CT abd NGT suction  Consults   Surgery GI   DVT Prophylaxis  SCD's  Medications  Scheduled Meds: . antiseptic oral rinse  7 mL Mouth Rinse q12n4p  . cefTRIAXone (ROCEPHIN)  IV  1 g Intravenous Q24H  . chlorhexidine  15 mL Mouth Rinse BID  . diatrizoate meglumine-sodium  90 mL Per NG tube Once  . pantoprazole (PROTONIX)  IV  40 mg Intravenous Q12H   Continuous Infusions: . sodium chloride 100 mL/hr at 09/21/15 0434   PRN Meds:.acetaminophen **OR** acetaminophen, albuterol, hydrALAZINE, LORazepam, morphine injection, nitroGLYCERIN, ondansetron **OR** ondansetron (ZOFRAN) IV   Antibiotics   Anti-infectives    Start     Dose/Rate Route Frequency Ordered Stop   09/18/15 2300  cefTRIAXone (ROCEPHIN) 1 g in dextrose 5 % 50 mL IVPB     1 g 100 mL/hr over 30 Minutes Intravenous Every 24 hours 09/18/15 2251     09/18/15 2115  cefTRIAXone (ROCEPHIN) 1 g in dextrose 5 % 50 mL IVPB     1 g 100 mL/hr over 30 Minutes Intravenous  Once 09/18/15 2106 09/18/15 2308        Subjective:   Logan Day was seen and examined today.  Wants to have food or water, abdominal pain. No vomiting. Patient pulled out NGT yesterday. No fevers or  chills.  Objective:   Filed Vitals:   09/20/15 1230 09/20/15 1500 09/20/15 2145 09/21/15 0344  BP: 149/72 141/66 158/72 135/58  Pulse: 62 61 78 75  Temp: 98 F (36.7 C) 98.1 F (36.7 C) 98.6 F (37 C) 98.6 F (37 C)  TempSrc: Oral Oral Oral Oral  Resp: 20 20 18 18   Height:      Weight:    71.033 kg (156 lb 9.6 oz)  SpO2: 96% 96% 97% 93%    Intake/Output Summary (Last 24 hours) at 09/21/15 1106 Last data filed at 09/21/15 0955  Gross per 24 hour  Intake      0 ml  Output    950 ml  Net   -950 ml     Wt Readings from Last 3 Encounters:  09/21/15 71.033 kg (156 lb 9.6 oz)  08/27/15 71.3 kg (157 lb 3 oz)  06/10/15 71.668 kg (158 lb)     Exam  General: Alert and oriented x self, calm and cooperative today, calm. Comfortable  HEENT:    Neck: Supple, no JVD  CVS: S1 S2 clear Regular rate and rhythm.  Respiratory: Decreased breath sounds at the bases  Abdomen: Soft, NT, + bowel sounds  Ext: no cyanosis clubbing or edema  Neuro: moving all 4 extremities  Skin: No rashes  Psych:    Data Review   Micro Results Recent Results (from the past 240 hour(s))  Urine culture     Status: None (Preliminary result)   Collection Time: 09/18/15  6:56 PM  Result Value Ref Range Status   Specimen Description URINE, CLEAN CATCH  Final   Special Requests NONE  Final   Culture >=100,000 COLONIES/mL GRAM NEGATIVE RODS  Final   Report Status PENDING  Incomplete  MRSA PCR Screening     Status: None   Collection Time: 09/19/15  1:09 AM  Result Value Ref Range Status   MRSA by PCR NEGATIVE NEGATIVE Final    Comment:        The GeneXpert MRSA Assay (FDA approved for NASAL specimens only), is one component of a comprehensive MRSA colonization surveillance program. It is not intended to diagnose MRSA infection nor to guide or monitor treatment for MRSA infections.     Radiology Reports Ct Abdomen Pelvis Wo Contrast  09/18/2015  CLINICAL DATA:  82 year old male with  left flank pain radiating into the left lower quadrant with associated vomiting EXAM: CT ABDOMEN AND PELVIS WITHOUT CONTRAST TECHNIQUE: Multidetector CT imaging of the abdomen and pelvis was performed following the standard protocol without IV contrast.  COMPARISON:  Prior CT scan of the chest including the upper abdomen 11/30/2014 FINDINGS: Lower Chest: Small right-sided pleural effusion and associated right lower lobe atelectasis. Background of emphysema and chronic interstitial prominence. The lungs are hyperinflated. Borderline cardiomegaly. No pericardial effusion. Unremarkable imaged distal thoracic esophagus. Abdomen: Unenhanced CT was performed per clinician order. Lack of IV contrast limits sensitivity and specificity, especially for evaluation of abdominal/pelvic solid viscera. Within these limitations, unremarkable CT appearance of the stomach, duodenum, spleen, adrenal glands and pancreas. Normal hepatic contour morphology. No discrete hepatic lesion. Small stones layer within the gallbladder lumen. No intra or extrahepatic biliary ductal dilatation. Nonobstructing nephrolithiasis bilaterally. Nonspecific perinephric stranding bilaterally. No evidence of hydronephrosis. Multiple loops of dilated and fluid-filled small bowel throughout the abdomen. The source of obstruction appears to be the ileocecal junction. There is diffuse submucosal edema of the cecum in the region of the ileocecal junction. Evaluations significantly limited in the absence of intravenous contrast material. The appendix is identified and is normal. There is associated mesenteric edema and small volume ascites. Colonic diverticular disease without CT evidence of active inflammation. Pelvis: Surgical changes of prior prostatectomy. The bladder is unremarkable. Musculoskeletal: No acute fracture or aggressive appearing lytic or blastic osseous lesion. Vascular: Limited evaluation in the absence of intravenous contrast. Moderate  atherosclerotic vascular calcifications without aneurysm. IMPRESSION: 1. High-grade distal small bowel obstruction. The location of the obstruction appears to be at the ileocecal junction were there is diffuse submucosal edema involving the cecum. Differential considerations include terminal ileitis, typhlitis with secondary obstruction, and potentially an obstructing ileocecal mass. Evaluation is limited in the absence of intravenous contrast. There is associated mesenteric edema and small volume ascites related to this process. 2. Bilateral nonobstructing nephrolithiasis. 3. The appendix is identified and is normal. 4. Colonic diverticular disease without CT evidence of active inflammation. 5. Pulmonary emphysema and chronic interstitial prominence. 6. Small right-sided pleural effusion. 7. Borderline cardiomegaly. 8. Cholelithiasis without evidence of acute cholecystitis. 9. Nonspecific perinephric stranding bilaterally consistent with the clinical history of renal dysfunction. 10. Surgical changes of prior prostatectomy. 11. Atherosclerotic vascular calcifications. Electronically Signed   By: Jacqulynn Cadet M.D.   On: 09/18/2015 20:50   Dg Chest 2 View  08/26/2015  CLINICAL DATA:  Headache, prostate cancer, pale EXAM: CHEST  2 VIEW COMPARISON:  05/19/2015 FINDINGS: Cardiomediastinal silhouette is stable. No acute infiltrate or pleural effusion. No pulmonary edema. Osteopenia and mild degenerative changes thoracic spine. Hyperinflation again noted. IMPRESSION: No active disease. Hyperinflation again noted. Osteopenia and mild degenerative changes thoracic spine. Electronically Signed   By: Lahoma Crocker M.D.   On: 08/26/2015 14:50   Dg Abd 1 View  08/26/2015  CLINICAL DATA:  82 year old male with constipation. EXAM: ABDOMEN - 1 VIEW COMPARISON:  None. FINDINGS: The bowel gas pattern is unremarkable. There is no evidence of bowel obstruction. A small amount of stool in the colon and rectum noted. Surgical  clips in the pelvis are present. IMPRESSION: Unremarkable bowel gas pattern. Electronically Signed   By: Margarette Canada M.D.   On: 08/26/2015 18:48   Ct Head Wo Contrast  08/26/2015  CLINICAL DATA:  Altered mental status and headache today. Initial encounter. EXAM: CT HEAD WITHOUT CONTRAST TECHNIQUE: Contiguous axial images were obtained from the base of the skull through the vertex without intravenous contrast. COMPARISON:  Brain MRI 07/16/2011.  Head CT scan 07/15/2011. FINDINGS: The brain is atrophic with extensive chronic microvascular ischemic change. Remote bilateral posterior temporal and parietal infarcts are identified. No evidence  of acute infarction, hemorrhage, mass lesion, midline shift or abnormal extra-axial fluid collection is seen. No pneumocephalus or hydrocephalus. The calvarium is intact. Mucous retention cysts or polyps right maxillary sinus noted. Mild ethmoid air cell disease is seen on the right. IMPRESSION: No acute abnormality. Remote bilateral parietotemporal infarcts. Atrophy and chronic microvascular ischemic change. Electronically Signed   By: Inge Rise M.D.   On: 08/26/2015 16:24   Dg Chest Portable 1 View  09/18/2015  CLINICAL DATA:  Generalized abdominal pain. EXAM: PORTABLE CHEST 1 VIEW COMPARISON:  August 26, 2015 FINDINGS: The cardiomediastinal silhouette is stable. A torturous thoracic aorta is again identified. No pneumothorax. No pulmonary nodules, masses, or focal infiltrates. IMPRESSION: No active disease. Electronically Signed   By: Dorise Bullion III M.D   On: 09/18/2015 18:08   Dg Abd 2 Views  09/19/2015  CLINICAL DATA:  Lower abdominal pain, small-bowel obstruction EXAM: ABDOMEN - 2 VIEW COMPARISON:  09/18/2015 FINDINGS: NG tube tip is in the proximal stomach with the side port in the distal esophagus. Nonobstructive bowel gas pattern. No free air organomegaly. No suspicious calcification. Surgical clips in the lower pelvis. IMPRESSION: NG tube tip in the  proximal stomach with the side port in the distal esophagus. No evidence of obstruction or free air. Electronically Signed   By: Rolm Baptise M.D.   On: 09/19/2015 11:45   Dg Abd Portable 1v  09/18/2015  CLINICAL DATA:  Abdominal pain for 1 day EXAM: PORTABLE ABDOMEN - 1 VIEW COMPARISON:  08/26/2015 FINDINGS: Scattered large and small bowel gas is noted. No obstructive changes are noted. Postoperative changes are seen. No free air is noted. No acute bony abnormality is noted. IMPRESSION: No acute abnormality seen. Electronically Signed   By: Inez Catalina M.D.   On: 09/18/2015 18:12    CBC  Recent Labs Lab 09/18/15 1742 09/19/15 0547 09/20/15 1139 09/21/15 0413  WBC 9.4 11.6* 9.7 7.9  HGB 11.5* 11.5* 11.2* 10.9*  HCT 36.3* 37.7* 36.5* 35.4*  PLT 283 286 284 248  MCV 91.2 91.7 92.9 92.9  MCH 28.9 28.0 28.5 28.6  MCHC 31.7 30.5 30.7 30.8  RDW 19.0* 19.1* 19.7* 19.9*  LYMPHSABS 0.9  --   --   --   MONOABS 0.4  --   --   --   EOSABS 0.2  --   --   --   BASOSABS 0.0  --   --   --     Chemistries   Recent Labs Lab 09/18/15 1742 09/19/15 0547 09/21/15 0413  NA 138 138 144  K 4.2 3.9 3.5  CL 102 104 111  CO2 21* 23 24  GLUCOSE 124* 133* 91  BUN 18 17 19   CREATININE 1.74* 1.60* 1.59*  CALCIUM 9.1 8.5* 8.3*  AST 42*  --   --   ALT 28  --   --   ALKPHOS 63  --   --   BILITOT 0.6  --   --    ------------------------------------------------------------------------------------------------------------------ estimated creatinine clearance is 37.2 mL/min (by C-G formula based on Cr of 1.59). ------------------------------------------------------------------------------------------------------------------ No results for input(s): HGBA1C in the last 72 hours. ------------------------------------------------------------------------------------------------------------------ No results for input(s): CHOL, HDL, LDLCALC, TRIG, CHOLHDL, LDLDIRECT in the last 72  hours. ------------------------------------------------------------------------------------------------------------------ No results for input(s): TSH, T4TOTAL, T3FREE, THYROIDAB in the last 72 hours.  Invalid input(s): FREET3 ------------------------------------------------------------------------------------------------------------------ No results for input(s): VITAMINB12, FOLATE, FERRITIN, TIBC, IRON, RETICCTPCT in the last 72 hours.  Coagulation profile No results for input(s): INR, PROTIME in  the last 168 hours.  No results for input(s): DDIMER in the last 72 hours.  Cardiac Enzymes  Recent Labs Lab 09/18/15 1742  TROPONINI <0.03   ------------------------------------------------------------------------------------------------------------------ Invalid input(s): POCBNP   Recent Labs  09/19/15 1708 09/19/15 2119 09/20/15 0622 09/20/15 2148 09/21/15 0339 09/21/15 0532  GLUCAP 118* 133* 107* 45 76 85     Sheilyn Boehlke M.D. [facility] 09/21/2015, 11:06 AM  Pager: [phone] Between 7am to 7pm - call Pager - [phone]  After 7pm go to [email or web address] - password [email or web address]  Call night coverage person covering after 7pm

## 2015-09-21 NOTE — Progress Notes (Signed)
Patient positive for DVT to left upper extremity and superficial vein thrombosis to left cephalic vein. Dr. Tana Coast notified. New orders noted.

## 2015-09-22 ENCOUNTER — Inpatient Hospital Stay (HOSPITAL_COMMUNITY): Payer: Medicare Other

## 2015-09-22 LAB — BASIC METABOLIC PANEL
Anion gap: 11 (ref 5–15)
BUN: 18 mg/dL (ref 6–20)
CALCIUM: 8.4 mg/dL — AB (ref 8.9–10.3)
CHLORIDE: 110 mmol/L (ref 101–111)
CO2: 22 mmol/L (ref 22–32)
CREATININE: 1.34 mg/dL — AB (ref 0.61–1.24)
GFR calc Af Amer: 56 mL/min — ABNORMAL LOW (ref >60)
GFR calc non Af Amer: 48 mL/min — ABNORMAL LOW (ref >60)
Glucose, Bld: 97 mg/dL (ref 65–99)
Potassium: 3.9 mmol/L (ref 3.5–5.1)
Sodium: 143 mmol/L (ref 135–145)

## 2015-09-22 LAB — CBC
HCT: 33.2 % — ABNORMAL LOW (ref 39.0–52.0)
Hemoglobin: 10.5 g/dL — ABNORMAL LOW (ref 13.0–17.0)
MCH: 29.2 pg (ref 26.0–34.0)
MCHC: 31.6 g/dL (ref 30.0–36.0)
MCV: 92.5 fL (ref 78.0–100.0)
PLATELETS: 180 10*3/uL (ref 150–400)
RBC: 3.59 MIL/uL — ABNORMAL LOW (ref 4.22–5.81)
RDW: 19.9 % — AB (ref 11.5–15.5)
WBC: 8 10*3/uL (ref 4.0–10.5)

## 2015-09-22 LAB — GLUCOSE, CAPILLARY
GLUCOSE-CAPILLARY: 82 mg/dL (ref 65–99)
Glucose-Capillary: 109 mg/dL — ABNORMAL HIGH (ref 65–99)
Glucose-Capillary: 104 mg/dL — ABNORMAL HIGH (ref 65–99)
Glucose-Capillary: 96 mg/dL (ref 65–99)

## 2015-09-22 LAB — HEPARIN LEVEL (UNFRACTIONATED): Heparin Unfractionated: <0.1 IU/mL — ABNORMAL LOW (ref 0.30–0.70)

## 2015-09-22 MED ORDER — ONDANSETRON HCL 4 MG/2ML IJ SOLN
4.0000 mg | Freq: Once | INTRAMUSCULAR | Status: AC
  Administered 2015-09-22: 4 mg via INTRAVENOUS

## 2015-09-22 MED ORDER — HEPARIN BOLUS VIA INFUSION
2000.0000 [IU] | Freq: Once | INTRAVENOUS | Status: AC
  Administered 2015-09-22: 2000 [IU] via INTRAVENOUS
  Filled 2015-09-22: qty 2000

## 2015-09-22 NOTE — Care Management Important Message (Signed)
Important Message  Patient Details  Name: Logan Day MRN: [id number] Date of Birth: 1932-10-11   Medicare Important Message Given:  Yes    [name] 09/22/2015, 11:15 AM

## 2015-09-22 NOTE — Progress Notes (Signed)
[facility]                                                                              Patient Demographics  Logan Day, is a 82 y.o. male, DOB - 05-25-1933, [id number]  Admit date - 09/18/2015   Admitting Physician Rise Patience, MD  Outpatient Primary MD for the patient is No primary care provider on file.  LOS - 4   Chief Complaint  Patient presents with  . Abdominal Pain       Brief HPI   Logan Day is a 82 y.o. male who was recently admitted for GI bleed and at that time patient's xarelto was discontinued presented to the ER because of abdominal pain and nausea vomiting. Patient reported that he had a large bowel movement this morning and later started developing abdominal pain in the lower quadrants with multiple episodes of nausea and vomiting. CT abdomen and pelvis shows high-grade bowel obstruction at the ileocecal junction. On-call surgeon Dr. Grandville Silos was consulted and the patient was placed on NG tube and admitted for further management.    Assessment & Plan     SBO (small bowel obstruction) ([place]) --NG tube was placed on admission however patient pulled it out on 2/18.  -  CT abdomen showed high-grade small bowel obstruction at the ED a cecal junction ? Edema versus mass - Gen. surgery following, was placed on clears on 2/19 however patient having emesis. Abdominal x-ray obtained this morning showed the development of high-grade small bowel obstruction. - GI also following, no plans of colonoscopy - Patient's sons at the bedside, frustrated, want something to be done ASAP. Awaiting surgery recommendations  Left upper extremity DVT - Doppler ultrasound of the left approximately showed acute DVT in the left brachial vein - Patient was placed on IV heparin drip, per patient's son patient has not tolerated anticoagulation in the past and had GI bleed. Patient appeared to have reddish color emesis, however just had red Jell-O before  the emesis. Heparin drip was discontinued. I discussed with vascular surgery, Dr. Donnetta Hutching who recommended no IVC filter, discussed with IR who also recommended no IVC filter needed in this situation as left brachial DVT rarely ever extend to PE.    Essential hypertension - Currently stable, continue IV hydralazine as needed with parameters    PAF (paroxysmal atrial fibrillation) (HCC) - Currently rate controlled,  - [place] vasc 7, however anticoagulation was discontinued during previous admission due to recent GI bleed - Discussed in detail with patient's son at the bedside, understands the risk of possible CVA in future however no further anticoagulation     Acute on CKD (chronic kidney disease), stage II likely due to #1 - Continue gentle hydration, creatinine improving  Klebsiella pneumonia UTI (lower urinary tract infection) -  continue Rocephin for sensitivities  Chronic systolic CHF - Currently compensated, however decrease IV fluids to 60 mL an hour -  echo 4/16  EF 40-45%,  COPD - Currently stable, not wheezing  Acute encephalopathy on Dementia - Likely worsened due to UTI and SBO - Continue to hold Wellbutrin, Xanax, Zoloft, trazodone for now. Placed  on IV Ativan as needed, no Haldol due to QTC prolongation  Recent GI bleed - Currently H&H stable, continue IV PPI  Urinary retention - Placed in and out cath, still having urinary retention, place Foley catheter - Placed on Flomax  Code Status: DO NOT RESUSCITATE   Family Communication: Discussed with both sons at the bedside  Disposition Plan:   Time Spent in minutes  45 minutes coordinating management with specialties and discussing with both sons at the bedside about the plan and management  Procedures  CT abd NGT suction  Consults   Surgery GI   DVT Prophylaxis  SCD's  Medications  Scheduled Meds: . antiseptic oral rinse  7 mL Mouth Rinse q12n4p  . cefTRIAXone (ROCEPHIN)  IV  1 g Intravenous Q24H  .  chlorhexidine  15 mL Mouth Rinse BID  . diatrizoate meglumine-sodium  90 mL Per NG tube Once  . pantoprazole (PROTONIX) IV  40 mg Intravenous Q12H   Continuous Infusions: . sodium chloride 100 mL/hr at 09/21/15 1944   PRN Meds:.acetaminophen **OR** acetaminophen, albuterol, hydrALAZINE, LORazepam, morphine injection, nitroGLYCERIN, [DISCONTINUED] ondansetron **OR** ondansetron (ZOFRAN) IV   Antibiotics   Anti-infectives    Start     Dose/Rate Route Frequency Ordered Stop   09/18/15 2300  cefTRIAXone (ROCEPHIN) 1 g in dextrose 5 % 50 mL IVPB     1 g 100 mL/hr over 30 Minutes Intravenous Every 24 hours 09/18/15 2251     09/18/15 2115  cefTRIAXone (ROCEPHIN) 1 g in dextrose 5 % 50 mL IVPB     1 g 100 mL/hr over 30 Minutes Intravenous  Once 09/18/15 2106 09/18/15 2308        Subjective:   Logan Day was seen and examined today.  Confused having nausea and vomiting. Also complaining of abdominal pain, no fevers or chills.   Objective:   Filed Vitals:   09/21/15 2125 09/22/15 0529 09/22/15 0916 09/22/15 1126  BP: 154/79 153/72 130/62 134/74  Pulse: 92 78 75 81  Temp: 98.9 F (37.2 C) 98.4 F (36.9 C)  98.6 F (37 C)  TempSrc: Oral Oral  Oral  Resp: 20 18    Height:      Weight:  71.169 kg (156 lb 14.4 oz)    SpO2: 95% 92%  94%    Intake/Output Summary (Last 24 hours) at 09/22/15 1140 Last data filed at 09/22/15 0844  Gross per 24 hour  Intake   5230 ml  Output    775 ml  Net   4455 ml     Wt Readings from Last 3 Encounters:  09/22/15 71.169 kg (156 lb 14.4 oz)  08/27/15 71.3 kg (157 lb 3 oz)  06/10/15 71.668 kg (158 lb)     Exam  General: Alert and oriented x self, confused   HEENT:    Neck: Supple, no JVD  CVS: S1 S2 clear Regular rate and rhythm.  Respiratory: Decreased breath sounds at the bases  Abdomen: Soft, mildly tender, hypoactive bowel sounds  Ext: no cyanosis clubbing or edema  Neuro: moving all 4 extremities  Skin: No  rashes  Psych: confused   Data Review   Micro Results Recent Results (from the past 240 hour(s))  Urine culture     Status: None   Collection Time: 09/18/15  6:56 PM  Result Value Ref Range Status   Specimen Description URINE, CLEAN CATCH  Final   Special Requests NONE  Final   Culture >=100,000 COLONIES/mL KLEBSIELLA PNEUMONIAE  Final  Report Status 09/21/2015 FINAL  Final   Organism ID, Bacteria KLEBSIELLA PNEUMONIAE  Final      Susceptibility   Klebsiella pneumoniae - MIC*    AMPICILLIN >=32 RESISTANT Resistant     CEFAZOLIN <=4 SENSITIVE Sensitive     CEFTRIAXONE <=1 SENSITIVE Sensitive     CIPROFLOXACIN <=0.25 SENSITIVE Sensitive     GENTAMICIN <=1 SENSITIVE Sensitive     IMIPENEM <=0.25 SENSITIVE Sensitive     NITROFURANTOIN 32 SENSITIVE Sensitive     TRIMETH/SULFA <=20 SENSITIVE Sensitive     AMPICILLIN/SULBACTAM 4 SENSITIVE Sensitive     PIP/TAZO <=4 SENSITIVE Sensitive     * >=100,000 COLONIES/mL KLEBSIELLA PNEUMONIAE  MRSA PCR Screening     Status: None   Collection Time: 09/19/15  1:09 AM  Result Value Ref Range Status   MRSA by PCR NEGATIVE NEGATIVE Final    Comment:        The GeneXpert MRSA Assay (FDA approved for NASAL specimens only), is one component of a comprehensive MRSA colonization surveillance program. It is not intended to diagnose MRSA infection nor to guide or monitor treatment for MRSA infections.     Radiology Reports Ct Abdomen Pelvis Wo Contrast  09/18/2015  CLINICAL DATA:  82 year old male with left flank pain radiating into the left lower quadrant with associated vomiting EXAM: CT ABDOMEN AND PELVIS WITHOUT CONTRAST TECHNIQUE: Multidetector CT imaging of the abdomen and pelvis was performed following the standard protocol without IV contrast. COMPARISON:  Prior CT scan of the chest including the upper abdomen 11/30/2014 FINDINGS: Lower Chest: Small right-sided pleural effusion and associated right lower lobe atelectasis. Background of  emphysema and chronic interstitial prominence. The lungs are hyperinflated. Borderline cardiomegaly. No pericardial effusion. Unremarkable imaged distal thoracic esophagus. Abdomen: Unenhanced CT was performed per clinician order. Lack of IV contrast limits sensitivity and specificity, especially for evaluation of abdominal/pelvic solid viscera. Within these limitations, unremarkable CT appearance of the stomach, duodenum, spleen, adrenal glands and pancreas. Normal hepatic contour morphology. No discrete hepatic lesion. Small stones layer within the gallbladder lumen. No intra or extrahepatic biliary ductal dilatation. Nonobstructing nephrolithiasis bilaterally. Nonspecific perinephric stranding bilaterally. No evidence of hydronephrosis. Multiple loops of dilated and fluid-filled small bowel throughout the abdomen. The source of obstruction appears to be the ileocecal junction. There is diffuse submucosal edema of the cecum in the region of the ileocecal junction. Evaluations significantly limited in the absence of intravenous contrast material. The appendix is identified and is normal. There is associated mesenteric edema and small volume ascites. Colonic diverticular disease without CT evidence of active inflammation. Pelvis: Surgical changes of prior prostatectomy. The bladder is unremarkable. Musculoskeletal: No acute fracture or aggressive appearing lytic or blastic osseous lesion. Vascular: Limited evaluation in the absence of intravenous contrast. Moderate atherosclerotic vascular calcifications without aneurysm. IMPRESSION: 1. High-grade distal small bowel obstruction. The location of the obstruction appears to be at the ileocecal junction were there is diffuse submucosal edema involving the cecum. Differential considerations include terminal ileitis, typhlitis with secondary obstruction, and potentially an obstructing ileocecal mass. Evaluation is limited in the absence of intravenous contrast. There is  associated mesenteric edema and small volume ascites related to this process. 2. Bilateral nonobstructing nephrolithiasis. 3. The appendix is identified and is normal. 4. Colonic diverticular disease without CT evidence of active inflammation. 5. Pulmonary emphysema and chronic interstitial prominence. 6. Small right-sided pleural effusion. 7. Borderline cardiomegaly. 8. Cholelithiasis without evidence of acute cholecystitis. 9. Nonspecific perinephric stranding bilaterally consistent with the clinical history of  renal dysfunction. 10. Surgical changes of prior prostatectomy. 11. Atherosclerotic vascular calcifications. Electronically Signed   By: Jacqulynn Cadet M.D.   On: 09/18/2015 20:50   Dg Chest 2 View  08/26/2015  CLINICAL DATA:  Headache, prostate cancer, pale EXAM: CHEST  2 VIEW COMPARISON:  05/19/2015 FINDINGS: Cardiomediastinal silhouette is stable. No acute infiltrate or pleural effusion. No pulmonary edema. Osteopenia and mild degenerative changes thoracic spine. Hyperinflation again noted. IMPRESSION: No active disease. Hyperinflation again noted. Osteopenia and mild degenerative changes thoracic spine. Electronically Signed   By: Lahoma Crocker M.D.   On: 08/26/2015 14:50   Dg Abd 1 View  08/26/2015  CLINICAL DATA:  82 year old male with constipation. EXAM: ABDOMEN - 1 VIEW COMPARISON:  None. FINDINGS: The bowel gas pattern is unremarkable. There is no evidence of bowel obstruction. A small amount of stool in the colon and rectum noted. Surgical clips in the pelvis are present. IMPRESSION: Unremarkable bowel gas pattern. Electronically Signed   By: Margarette Canada M.D.   On: 08/26/2015 18:48   Ct Head Wo Contrast  08/26/2015  CLINICAL DATA:  Altered mental status and headache today. Initial encounter. EXAM: CT HEAD WITHOUT CONTRAST TECHNIQUE: Contiguous axial images were obtained from the base of the skull through the vertex without intravenous contrast. COMPARISON:  Brain MRI 07/16/2011.  Head CT  scan 07/15/2011. FINDINGS: The brain is atrophic with extensive chronic microvascular ischemic change. Remote bilateral posterior temporal and parietal infarcts are identified. No evidence of acute infarction, hemorrhage, mass lesion, midline shift or abnormal extra-axial fluid collection is seen. No pneumocephalus or hydrocephalus. The calvarium is intact. Mucous retention cysts or polyps right maxillary sinus noted. Mild ethmoid air cell disease is seen on the right. IMPRESSION: No acute abnormality. Remote bilateral parietotemporal infarcts. Atrophy and chronic microvascular ischemic change. Electronically Signed   By: Inge Rise M.D.   On: 08/26/2015 16:24   Dg Chest Portable 1 View  09/18/2015  CLINICAL DATA:  Generalized abdominal pain. EXAM: PORTABLE CHEST 1 VIEW COMPARISON:  August 26, 2015 FINDINGS: The cardiomediastinal silhouette is stable. A torturous thoracic aorta is again identified. No pneumothorax. No pulmonary nodules, masses, or focal infiltrates. IMPRESSION: No active disease. Electronically Signed   By: Dorise Bullion III M.D   On: 09/18/2015 18:08   Dg Abd 2 Views  09/22/2015  CLINICAL DATA:  Follow-up of small bowel obstruction EXAM: ABDOMEN - 2 VIEW COMPARISON:  Acute abdominal series dated February 16, 2016 FINDINGS: There has been reaccumulation of considerable gas within mildly distended small bowel loops in the mid and upper abdomen. There are numerous air-fluid levels. There small amounts of fluid and gas in the colon. There is some rectal gas. No free extraluminal gas collections are observed. The bones are osteopenic. There surgical clips in the pelvis from previous prostatectomy and lymph node dissection. IMPRESSION: Redevelopment of a mid to distal small bowel obstruction that appears fairly high-grade. There is no evidence of perforation. Electronically Signed   By: [name]  Martinique M.D.   On: 09/22/2015 07:46   Dg Abd 2 Views  09/19/2015  CLINICAL DATA:  Lower abdominal  pain, small-bowel obstruction EXAM: ABDOMEN - 2 VIEW COMPARISON:  09/18/2015 FINDINGS: NG tube tip is in the proximal stomach with the side port in the distal esophagus. Nonobstructive bowel gas pattern. No free air organomegaly. No suspicious calcification. Surgical clips in the lower pelvis. IMPRESSION: NG tube tip in the proximal stomach with the side port in the distal esophagus. No evidence of obstruction or  free air. Electronically Signed   By: Rolm Baptise M.D.   On: 09/19/2015 11:45   Dg Abd Portable 1v  09/18/2015  CLINICAL DATA:  Abdominal pain for 1 day EXAM: PORTABLE ABDOMEN - 1 VIEW COMPARISON:  08/26/2015 FINDINGS: Scattered large and small bowel gas is noted. No obstructive changes are noted. Postoperative changes are seen. No free air is noted. No acute bony abnormality is noted. IMPRESSION: No acute abnormality seen. Electronically Signed   By: Inez Catalina M.D.   On: 09/18/2015 18:12    CBC  Recent Labs Lab 09/18/15 1742 09/19/15 0547 09/20/15 1139 09/21/15 0413 09/22/15 0110  WBC 9.4 11.6* 9.7 7.9 8.0  HGB 11.5* 11.5* 11.2* 10.9* 10.5*  HCT 36.3* 37.7* 36.5* 35.4* 33.2*  PLT 283 286 284 248 180  MCV 91.2 91.7 92.9 92.9 92.5  MCH 28.9 28.0 28.5 28.6 29.2  MCHC 31.7 30.5 30.7 30.8 31.6  RDW 19.0* 19.1* 19.7* 19.9* 19.9*  LYMPHSABS 0.9  --   --   --   --   MONOABS 0.4  --   --   --   --   EOSABS 0.2  --   --   --   --   BASOSABS 0.0  --   --   --   --     Chemistries   Recent Labs Lab 09/18/15 1742 09/19/15 0547 09/21/15 0413 09/22/15 0110  NA 138 138 144 143  K 4.2 3.9 3.5 3.9  CL 102 104 111 110  CO2 21* 23 24 22   GLUCOSE 124* 133* 91 97  BUN 18 17 19 18   CREATININE 1.74* 1.60* 1.59* 1.34*  CALCIUM 9.1 8.5* 8.3* 8.4*  AST 42*  --   --   --   ALT 28  --   --   --   ALKPHOS 63  --   --   --   BILITOT 0.6  --   --   --    ------------------------------------------------------------------------------------------------------------------ estimated  creatinine clearance is 44.3 mL/min (by C-G formula based on Cr of 1.34). ------------------------------------------------------------------------------------------------------------------ No results for input(s): HGBA1C in the last 72 hours. ------------------------------------------------------------------------------------------------------------------ No results for input(s): CHOL, HDL, LDLCALC, TRIG, CHOLHDL, LDLDIRECT in the last 72 hours. ------------------------------------------------------------------------------------------------------------------ No results for input(s): TSH, T4TOTAL, T3FREE, THYROIDAB in the last 72 hours.  Invalid input(s): FREET3 ------------------------------------------------------------------------------------------------------------------ No results for input(s): VITAMINB12, FOLATE, FERRITIN, TIBC, IRON, RETICCTPCT in the last 72 hours.  Coagulation profile No results for input(s): INR, PROTIME in the last 168 hours.  No results for input(s): DDIMER in the last 72 hours.  Cardiac Enzymes  Recent Labs Lab 09/18/15 1742  TROPONINI <0.03   ------------------------------------------------------------------------------------------------------------------ Invalid input(s): POCBNP   Recent Labs  09/21/15 0339 09/21/15 0532 09/21/15 1144 09/21/15 1856 09/21/15 2322 09/22/15 0723  GLUCAP 76 5 77 111* 89 109*     Mitra Duling M.D. [facility] 09/22/2015, 11:40 AM  Pager: [phone] Between 7am to 7pm - call Pager - [phone]  After 7pm go to [email or web address] - password [email or web address]  Call night coverage person covering after 7pm

## 2015-09-22 NOTE — Progress Notes (Signed)
Subjective: He is very confused, his son is with him.  He is also confused.  Pt with no nausea or vomiting reported, no BM.  He is on clears, not really complaining of pain.  Film as noted is showing SBO.  Son wants to talk with someone along with his brother who is POA.    Objective: Vital signs in last 24 hours: Temp:  [98.2 F (36.8 C)-98.9 F (37.2 C)] 98.4 F (36.9 C) 2022-09-30 0529) Pulse Rate:  [78-92] 78 September 30, 2022 0529) Resp:  [18-20] 18 Sep 30, 2022 0529) BP: (143-154)/(70-79) 153/72 mmHg Sep 30, 2022 0529) SpO2:  [92 %-95 %] 92 % 2022-09-30 0529) Weight:  [71.169 kg (156 lb 14.4 oz)] 71.169 kg (156 lb 14.4 oz) 09-30-22 0529) Last BM Date: 09/18/15 540 PO  Diet:  clears 1350 urine Stool - none Afebrile, VSS Labs OK DG abd:  Redevelopment of a mid to distal small bowel obstruction that appears fairly high-grade. There is no evidence of perforation Intake/Output from previous day: 02/19 0701 - Sep 30, 2022 0700 In: 4990 [P.O.:540; I.V.:4350; IV Piggyback:100] Out: 1350 [Urine:1350] Intake/Output this shift:    General appearance: alert and confused, but cooperative with me looking at him.   GI: soft, no complaints of pain, High pitched BS, No BM recorded.  this is questioned by son and patient.  Lab Results:   Recent Labs  09/21/15 0413 10/01/15 0110  WBC 7.9 8.0  HGB 10.9* 10.5*  HCT 35.4* 33.2*  PLT 248 180    BMET  Recent Labs  09/21/15 0413 10-01-2015 0110  NA 144 143  K 3.5 3.9  CL 111 110  CO2 24 22  GLUCOSE 91 97  BUN 19 18  CREATININE 1.59* 1.34*  CALCIUM 8.3* 8.4*   PT/INR No results for input(s): LABPROT, INR in the last 72 hours.   Recent Labs Lab 09/18/15 1742  AST 42*  ALT 28  ALKPHOS 63  BILITOT 0.6  PROT 7.3  ALBUMIN 3.4*     Lipase     Component Value Date/Time   LIPASE 21 09/18/2015 1742     Studies/Results: Dg Abd 2 Views  10/01/2015  CLINICAL DATA:  Follow-up of small bowel obstruction EXAM: ABDOMEN - 2 VIEW COMPARISON:  Acute abdominal  series dated February 16, 2016 FINDINGS: There has been reaccumulation of considerable gas within mildly distended small bowel loops in the mid and upper abdomen. There are numerous air-fluid levels. There small amounts of fluid and gas in the colon. There is some rectal gas. No free extraluminal gas collections are observed. The bones are osteopenic. There surgical clips in the pelvis from previous prostatectomy and lymph node dissection. IMPRESSION: Redevelopment of a mid to distal small bowel obstruction that appears fairly high-grade. There is no evidence of perforation. Electronically Signed   By: [name]  Martinique M.D.   On: 10/01/2015 07:46    Medications: . antiseptic oral rinse  7 mL Mouth Rinse q12n4p  . cefTRIAXone (ROCEPHIN)  IV  1 g Intravenous Q24H  . chlorhexidine  15 mL Mouth Rinse BID  . diatrizoate meglumine-sodium  90 mL Per NG tube Once  . pantoprazole (PROTONIX) IV  40 mg Intravenous Q12H  . tamsulosin  0.4 mg Oral Daily   . sodium chloride 100 mL/hr at 09/21/15 1944  . heparin 1,350 Units/hr (01-Oct-2015 0330)   Assessment/Plan SBO/hx of prostatectomy Possible ileocecal junction mass Hypertension] PAF - anticoagulation stopped for prior GI bleed Chronic kidney disease (stage II) CAD with prior Stents 2014/hx of CHF UTI COPD Acute  encephalopathy with dementia DVT LUE on heparin Antibiotics:  Day 5 rocephin - UTI  DNR   Plan:  No nausea or vomiting, I would go slow with the liquids.  GI is not currently planning colonoscopy and it was Dr. Amedeo Plenty opinion that there was a low probability for cecal mass.  I will discuss with Dr. Grandville Silos.   LOS: 4 days    JENNINGS,WILLARD 09/22/2015  Films today show recurrent obstruction. I have offered, ex lap, lysis of adhesions, and possible bowel resection. I spoke with him and his 2 sons who are his [facility] POA. I explained the risks and benefits. They agree to proceed and we are scheduling for tomorrow.  Georganna Skeans, MD, MPH,  FACS Trauma: [phone] General Surgery: [phone]

## 2015-09-22 NOTE — Progress Notes (Signed)
Discussed with Dr Tana Coast and family. Xray of abdomen today shows redevelopment of distal to mid high grade small bowel obstruction. Will defer to surgery's recommendations.

## 2015-09-22 NOTE — Progress Notes (Signed)
ANTICOAGULATION CONSULT NOTE - Initial Consult  Pharmacy Consult for heparin Indication: DVT  No Known Allergies  Patient Measurements: Height: 5\' 11"  (180.3 cm) Weight: 156 lb 9.6 oz (71.033 kg) (bedscale) IBW/kg (Calculated) : 75.3   Vital Signs: Temp: 98.9 F (37.2 C) (02/19 2125) Temp Source: Oral (02/19 2125) BP: 154/79 mmHg (02/19 2125) Pulse Rate: 92 (02/19 2125)  Labs:  Recent Labs  09/19/15 0547 09/20/15 1139 09/21/15 0413 09/22/15 0102 09/22/15 0110  HGB 11.5* 11.2* 10.9*  --  10.5*  HCT 37.7* 36.5* 35.4*  --  33.2*  PLT 286 284 248  --  180  HEPARINUNFRC  --   --   --  <0.10*  --   CREATININE 1.60*  --  1.59*  --  1.34*    Estimated Creatinine Clearance: 44.2 mL/min (by C-G formula based on Cr of 1.34).   Medical History: Past Medical History  Diagnosis Date  . Hypertension   . Atrial fibrillation ([place])   . Hypercholesteremia   . MI (myocardial infarction) ([place])     x 2  . Angina   . Cancer [facility])     prostate cancer  . Coronary artery disease     Circ stent (2009 cath patent)  . Prostate cancer ([place])   . Hyperlipidemia   . TIA (transient ischemic attack)   . GERD (gastroesophageal reflux disease)   . Shortness of breath dyspnea   . COPD (chronic obstructive pulmonary disease) ([place])   . Dementia   . Congestive heart failure ([place])   . GI bleed 08/2015    Medications:  Prescriptions prior to admission  Medication Sig Dispense Refill Last Dose  . acetaminophen (TYLENOL) 500 MG tablet Take 500 mg by mouth 2 (two) times daily.   09/18/2015 at Unknown time  . albuterol (PROAIR HFA) 108 (90 BASE) MCG/ACT inhaler Inhale 1-2 puffs into the lungs every 6 (six) hours as needed for wheezing or shortness of breath. 1 Inhaler 3 unknown  . amiodarone (PACERONE) 200 MG tablet Take 1 tablet (200 mg total) by mouth daily. 30 tablet 5 09/18/2015 at Unknown time  . aspirin EC 81 MG tablet Take 1 tablet (81 mg total) by mouth daily. Resume on 09/11/15   09/18/2015  at Unknown time  . bisacodyl (DULCOLAX) 5 MG EC tablet Take 5 mg by mouth daily as needed for moderate constipation.   09/17/2015 at Unknown time  . buPROPion (WELLBUTRIN SR) 150 MG 12 hr tablet Take 150 mg by mouth 2 (two) times daily.    09/18/2015 at Unknown time  . feeding supplement (BOOST / RESOURCE BREEZE) LIQD Take 1 Container by mouth 3 (three) times daily between meals. 90 Container 0 09/18/2015 at Unknown time  . ferrous sulfate 325 (65 FE) MG tablet Take 1 tablet (325 mg total) by mouth 2 (two) times daily with a meal. 90 tablet 0 09/18/2015 at Unknown time  . nitroGLYCERIN (NITROSTAT) 0.4 MG SL tablet Place 0.4 mg under the tongue every 5 (five) minutes as needed for chest pain.   prn  . ALPRAZolam (XANAX) 0.5 MG tablet Take 1 tablet (0.5 mg total) by mouth 2 (two) times daily as needed for anxiety. 30 tablet 0   . pantoprazole (PROTONIX) 40 MG tablet Take 1 tablet (40 mg total) by mouth daily at 12 noon. 30 tablet 0   . pravastatin (PRAVACHOL) 40 MG tablet Take 40 mg by mouth every evening.   08/26/2015 at Unknown time  . sertraline (ZOLOFT) 100 MG tablet Take  100 mg by mouth every evening.   08/25/2015 at Unknown time  . traZODone (DESYREL) 50 MG tablet Take 50 mg by mouth at bedtime.   08/25/2015 at Unknown time   Scheduled:  . antiseptic oral rinse  7 mL Mouth Rinse q12n4p  . cefTRIAXone (ROCEPHIN)  IV  1 g Intravenous Q24H  . chlorhexidine  15 mL Mouth Rinse BID  . diatrizoate meglumine-sodium  90 mL Per NG tube Once  . pantoprazole (PROTONIX) IV  40 mg Intravenous Q12H  . tamsulosin  0.4 mg Oral Daily    Assessment: 82 yo male with DVT to left upper extremity and superficial vein thrombosis to left cephalic vein to begin heparin per pharmacy. He is noted with a recent admission for GIB (08/26/15) and was on xarelto at that time for afib. No endoscopy or colonoscopy at that time. -Hg= 10.9, plt= 248  Initial HL is undetectable on heparin 1100 units/hr. Nurse reports no issues with  infusion or bleeding  Goal of Therapy:  Heparin level= 0.3-0.5 given recent GIB Monitor platelets by anticoagulation protocol: Yes   Plan:  Bolus 2000 units of heparin and increase rate to 1350 units/hr 8h HL Daily HL/CBC Monitor s/sx of bleeding   Andrey Cota. Diona Foley, PharmD, [place] Clinical Pharmacist Pager [phone] 09/22/2015 2:46 AM

## 2015-09-23 ENCOUNTER — Encounter (HOSPITAL_COMMUNITY)
Admission: EM | Disposition: A | Discharge status: Skilled Nursing Facility | Payer: Self-pay | Source: Home / Self Care | Attending: Internal Medicine

## 2015-09-23 ENCOUNTER — Inpatient Hospital Stay (HOSPITAL_COMMUNITY): Payer: Medicare Other | Admitting: Anesthesiology

## 2015-09-23 ENCOUNTER — Encounter (HOSPITAL_COMMUNITY): Payer: Self-pay | Admitting: Anesthesiology

## 2015-09-23 HISTORY — PX: LYSIS OF ADHESION: SHX5961

## 2015-09-23 HISTORY — PX: LAPAROTOMY: SHX154

## 2015-09-23 LAB — BASIC METABOLIC PANEL
ANION GAP: 9 (ref 5–15)
BUN: 13 mg/dL (ref 6–20)
CALCIUM: 8.1 mg/dL — AB (ref 8.9–10.3)
CO2: 23 mmol/L (ref 22–32)
Chloride: 111 mmol/L (ref 101–111)
Creatinine, Ser: 1.36 mg/dL — ABNORMAL HIGH (ref 0.61–1.24)
GFR calc non Af Amer: 48 mL/min — ABNORMAL LOW (ref >60)
GFR, EST AFRICAN AMERICAN: 55 mL/min — AB (ref >60)
Glucose, Bld: 91 mg/dL (ref 65–99)
Potassium: 3.3 mmol/L — ABNORMAL LOW (ref 3.5–5.1)
Sodium: 143 mmol/L (ref 135–145)

## 2015-09-23 LAB — CBC
HCT: 33.9 % — ABNORMAL LOW (ref 39.0–52.0)
HEMOGLOBIN: 10.7 g/dL — AB (ref 13.0–17.0)
MCH: 29.5 pg (ref 26.0–34.0)
MCHC: 31.6 g/dL (ref 30.0–36.0)
MCV: 93.4 fL (ref 78.0–100.0)
Platelets: 247 10*3/uL (ref 150–400)
RBC: 3.63 MIL/uL — AB (ref 4.22–5.81)
RDW: 19.5 % — ABNORMAL HIGH (ref 11.5–15.5)
WBC: 7.2 10*3/uL (ref 4.0–10.5)

## 2015-09-23 LAB — GLUCOSE, CAPILLARY
GLUCOSE-CAPILLARY: 84 mg/dL (ref 65–99)
Glucose-Capillary: 78 mg/dL (ref 65–99)
Glucose-Capillary: 70 mg/dL (ref 65–99)

## 2015-09-23 SURGERY — LAPAROTOMY, EXPLORATORY
Anesthesia: General

## 2015-09-23 MED ORDER — BUPIVACAINE-EPINEPHRINE (PF) 0.5% -1:200000 IJ SOLN
INTRAMUSCULAR | Status: DC | PRN
  Administered 2015-09-23: 20 mL

## 2015-09-23 MED ORDER — LIDOCAINE HCL (CARDIAC) 20 MG/ML IV SOLN
INTRAVENOUS | Status: DC | PRN
  Administered 2015-09-23: 100 mg via INTRAVENOUS

## 2015-09-23 MED ORDER — LACTATED RINGERS IV SOLN
INTRAVENOUS | Status: DC
  Administered 2015-09-23 – 2015-09-25 (×2): via INTRAVENOUS

## 2015-09-23 MED ORDER — BUPIVACAINE-EPINEPHRINE (PF) 0.5% -1:200000 IJ SOLN
INTRAMUSCULAR | Status: AC
Start: 2015-09-23 — End: 2015-09-23
  Filled 2015-09-23: qty 30

## 2015-09-23 MED ORDER — ALBUMIN HUMAN 5 % IV SOLN
INTRAVENOUS | Status: DC | PRN
  Administered 2015-09-23: 10:00:00 via INTRAVENOUS

## 2015-09-23 MED ORDER — SUGAMMADEX SODIUM 200 MG/2ML IV SOLN
INTRAVENOUS | Status: DC | PRN
  Administered 2015-09-23: 150 mg via INTRAVENOUS

## 2015-09-23 MED ORDER — ACETAMINOPHEN 10 MG/ML IV SOLN
INTRAVENOUS | Status: DC | PRN
  Administered 2015-09-23: 1000 mg via INTRAVENOUS

## 2015-09-23 MED ORDER — ONDANSETRON HCL 4 MG/2ML IJ SOLN
INTRAMUSCULAR | Status: DC | PRN
  Administered 2015-09-23: 4 mg via INTRAVENOUS

## 2015-09-23 MED ORDER — LIDOCAINE HCL (CARDIAC) 20 MG/ML IV SOLN
INTRAVENOUS | Status: AC
Start: 2015-09-23 — End: 2015-09-23
  Filled 2015-09-23: qty 5

## 2015-09-23 MED ORDER — SUCCINYLCHOLINE CHLORIDE 20 MG/ML IJ SOLN
INTRAMUSCULAR | Status: AC
  Filled 2015-09-23: qty 1

## 2015-09-23 MED ORDER — FENTANYL CITRATE (PF) 100 MCG/2ML IJ SOLN: 25.0000 ug | INTRAMUSCULAR | Status: DC | PRN

## 2015-09-23 MED ORDER — 0.9 % SODIUM CHLORIDE (POUR BTL) OPTIME
TOPICAL | Status: DC | PRN
  Administered 2015-09-23 (×3): 1000 mL

## 2015-09-23 MED ORDER — PROPOFOL 10 MG/ML IV BOLUS
INTRAVENOUS | Status: AC
  Filled 2015-09-23: qty 20

## 2015-09-23 MED ORDER — ROCURONIUM BROMIDE 50 MG/5ML IV SOLN
INTRAVENOUS | Status: AC
  Filled 2015-09-23: qty 1

## 2015-09-23 MED ORDER — PROPOFOL 10 MG/ML IV BOLUS
INTRAVENOUS | Status: DC | PRN
  Administered 2015-09-23: 100 mg via INTRAVENOUS

## 2015-09-23 MED ORDER — ONDANSETRON HCL 4 MG/2ML IJ SOLN
INTRAMUSCULAR | Status: AC
  Filled 2015-09-23: qty 2

## 2015-09-23 MED ORDER — ROCURONIUM BROMIDE 100 MG/10ML IV SOLN
INTRAVENOUS | Status: DC | PRN
  Administered 2015-09-23: 35 mg via INTRAVENOUS

## 2015-09-23 MED ORDER — FENTANYL CITRATE (PF) 250 MCG/5ML IJ SOLN
INTRAMUSCULAR | Status: AC
  Filled 2015-09-23: qty 5

## 2015-09-23 MED ORDER — POTASSIUM CHLORIDE 10 MEQ/100ML IV SOLN
10.0000 meq | INTRAVENOUS | Status: AC
  Administered 2015-09-23 (×4): 10 meq via INTRAVENOUS
  Filled 2015-09-23 (×4): qty 100

## 2015-09-23 MED ORDER — FENTANYL CITRATE (PF) 100 MCG/2ML IJ SOLN
INTRAMUSCULAR | Status: DC | PRN
  Administered 2015-09-23: 100 ug via INTRAVENOUS
  Administered 2015-09-23 (×3): 50 ug via INTRAVENOUS

## 2015-09-23 MED ORDER — POTASSIUM CHLORIDE 10 MEQ/100ML IV SOLN: 10.0000 meq | INTRAVENOUS | Status: DC

## 2015-09-23 MED ORDER — ACETAMINOPHEN 10 MG/ML IV SOLN
INTRAVENOUS | Status: AC
  Filled 2015-09-23: qty 100

## 2015-09-23 MED ORDER — SUCCINYLCHOLINE CHLORIDE 20 MG/ML IJ SOLN
INTRAMUSCULAR | Status: DC | PRN
  Administered 2015-09-23: 140 mg via INTRAVENOUS

## 2015-09-23 MED ORDER — SUGAMMADEX SODIUM 200 MG/2ML IV SOLN
INTRAVENOUS | Status: AC
Start: 2015-09-23 — End: 2015-09-23
  Filled 2015-09-23: qty 2

## 2015-09-23 SURGICAL SUPPLY — 45 items
BLADE SURG ROTATE 9660 (MISCELLANEOUS) IMPLANT
CANISTER SUCTION 2500CC (MISCELLANEOUS) ×3 IMPLANT
CHLORAPREP W/TINT 26ML (MISCELLANEOUS) ×3 IMPLANT
COVER SURGICAL LIGHT HANDLE (MISCELLANEOUS) ×3 IMPLANT
DRAPE LAPAROSCOPIC ABDOMINAL (DRAPES) ×3 IMPLANT
DRAPE WARM FLUID 44X44 (DRAPE) ×3 IMPLANT
DRSG OPSITE POSTOP 4X10 (GAUZE/BANDAGES/DRESSINGS) IMPLANT
DRSG OPSITE POSTOP 4X8 (GAUZE/BANDAGES/DRESSINGS) ×3 IMPLANT
ELECT BLADE 6.5 EXT (BLADE) IMPLANT
ELECT CAUTERY BLADE 6.4 (BLADE) ×3 IMPLANT
ELECT REM PT RETURN 9FT ADLT (ELECTROSURGICAL) ×3
ELECTRODE REM PT RTRN 9FT ADLT (ELECTROSURGICAL) ×1 IMPLANT
GLOVE BIO SURGEON STRL SZ 6.5 (GLOVE) ×2 IMPLANT
GLOVE BIO SURGEON STRL SZ7 (GLOVE) ×3 IMPLANT
GLOVE BIO SURGEON STRL SZ8 (GLOVE) ×3 IMPLANT
GLOVE BIO SURGEONS STRL SZ 6.5 (GLOVE) ×1
GLOVE BIOGEL PI IND STRL 7.0 (GLOVE) ×1 IMPLANT
GLOVE BIOGEL PI IND STRL 8 (GLOVE) ×1 IMPLANT
GLOVE BIOGEL PI INDICATOR 7.0 (GLOVE) ×2
GLOVE BIOGEL PI INDICATOR 8 (GLOVE) ×2
GLOVE SURG SS PI 6.5 STRL IVOR (GLOVE) ×3 IMPLANT
GOWN STRL REUS W/ TWL LRG LVL3 (GOWN DISPOSABLE) ×1 IMPLANT
GOWN STRL REUS W/ TWL XL LVL3 (GOWN DISPOSABLE) ×1 IMPLANT
GOWN STRL REUS W/TWL LRG LVL3 (GOWN DISPOSABLE) ×2
GOWN STRL REUS W/TWL XL LVL3 (GOWN DISPOSABLE) ×2
KIT BASIN OR (CUSTOM PROCEDURE TRAY) ×3 IMPLANT
KIT ROOM TURNOVER OR (KITS) ×3 IMPLANT
LIGASURE IMPACT 36 18CM CVD LR (INSTRUMENTS) IMPLANT
NEEDLE 22X1 1/2 (OR ONLY) (NEEDLE) ×3 IMPLANT
NS IRRIG 1000ML POUR BTL (IV SOLUTION) ×6 IMPLANT
PACK GENERAL/GYN (CUSTOM PROCEDURE TRAY) ×3 IMPLANT
PAD ARMBOARD 7.5X6 YLW CONV (MISCELLANEOUS) ×3 IMPLANT
SPECIMEN JAR LARGE (MISCELLANEOUS) IMPLANT
SPONGE LAP 18X18 X RAY DECT (DISPOSABLE) IMPLANT
STAPLER VISISTAT 35W (STAPLE) ×3 IMPLANT
SUCTION POOLE TIP (SUCTIONS) ×3 IMPLANT
SUT PDS AB 1 TP1 96 (SUTURE) ×6 IMPLANT
SUT SILK 2 0 SH CR/8 (SUTURE) ×3 IMPLANT
SUT SILK 2 0 TIES 10X30 (SUTURE) ×3 IMPLANT
SUT SILK 3 0 SH CR/8 (SUTURE) ×3 IMPLANT
SUT SILK 3 0 TIES 10X30 (SUTURE) ×3 IMPLANT
SYR CONTROL 10ML LL (SYRINGE) ×3 IMPLANT
TOWEL OR 17X26 10 PK STRL BLUE (TOWEL DISPOSABLE) ×3 IMPLANT
TRAY FOLEY CATH 16FRSI W/METER (SET/KITS/TRAYS/PACK) IMPLANT
YANKAUER SUCT BULB TIP NO VENT (SUCTIONS) IMPLANT

## 2015-09-23 NOTE — Anesthesia Procedure Notes (Signed)
Procedure Name: Intubation Date/Time: 09/23/2015 9:55 AM Performed by: Rebekah Chesterfield L Pre-anesthesia Checklist: Emergency Drugs available, Patient identified, Suction available, Patient being monitored and Timeout performed Patient Re-evaluated:Patient Re-evaluated prior to inductionOxygen Delivery Method: Circle system utilized Preoxygenation: Pre-oxygenation with 100% oxygen Intubation Type: IV induction, Cricoid Pressure applied and Rapid sequence Laryngoscope Size: Mac and 4 Grade View: Grade I Tube type: Oral Tube size: 8.0 mm Number of attempts: 1 Airway Equipment and Method: Stylet Placement Confirmation: ETT inserted through vocal cords under direct vision,  breath sounds checked- equal and bilateral and positive ETCO2 Secured at: 22 cm Tube secured with: Tape Dental Injury: Teeth and Oropharynx as per pre-operative assessment

## 2015-09-23 NOTE — Anesthesia Postprocedure Evaluation (Signed)
Anesthesia Post Note  Patient: Logan Day  Procedure(s) Performed: Procedure(s) (LRB): EXPLORATORY LAPAROTOMY (N/A) LYSIS OF ADHESION  (N/A)  Patient location during evaluation: PACU Anesthesia Type: General Level of consciousness: sedated Pain management: satisfactory to patient Vital Signs Assessment: post-procedure vital signs reviewed and stable Respiratory status: spontaneous breathing Cardiovascular status: stable Anesthetic complications: no    Last Vitals:  Filed Vitals:   09/23/15 1100 09/23/15 1115  BP: 153/70 157/73  Pulse: 84 82  Temp:    Resp: 12 9    Last Pain:  Filed Vitals:   09/23/15 1128  PainSc: 0-No pain                 Lawernce Earll EDWARD

## 2015-09-23 NOTE — Progress Notes (Signed)
Day of Surgery  Subjective: Agreeable with surgery  Objective: Vital signs in last 24 hours: Temp:  [98.6 F (37 C)-99.4 F (37.4 C)] 98.7 F (37.1 C) (02/21 0533) Pulse Rate:  [75-85] 85 (02/21 0533) Resp:  [18] 18 (02/21 0533) BP: (130-143)/(50-74) 143/68 mmHg (02/21 0533) SpO2:  [93 %-96 %] 96 % (02/21 0533) Weight:  [70.943 kg (156 lb 6.4 oz)] 70.943 kg (156 lb 6.4 oz) (02/21 0533) Last BM Date: 09/18/15  Intake/Output from previous day: 12-Oct-2022 0701 - 02/21 0700 In: 480 [P.O.:480] Out: 1200 [Urine:1200] Intake/Output this shift:    General appearance: cooperative GI: soft, NT  Lab Results:   Recent Labs  10-13-2015 0110 09/23/15 0447  WBC 8.0 7.2  HGB 10.5* 10.7*  HCT 33.2* 33.9*  PLT 180 247   BMET  Recent Labs  2015-10-13 0110 09/23/15 0447  NA 143 143  K 3.9 3.3*  CL 110 111  CO2 22 23  GLUCOSE 97 91  BUN 18 13  CREATININE 1.34* 1.36*  CALCIUM 8.4* 8.1*   PT/INR No results for input(s): LABPROT, INR in the last 72 hours. ABG No results for input(s): PHART, HCO3 in the last 72 hours.  Invalid input(s): PCO2, PO2  Studies/Results: Dg Abd 2 Views  10/13/2015  CLINICAL DATA:  Follow-up of small bowel obstruction EXAM: ABDOMEN - 2 VIEW COMPARISON:  Acute abdominal series dated February 16, 2016 FINDINGS: There has been reaccumulation of considerable gas within mildly distended small bowel loops in the mid and upper abdomen. There are numerous air-fluid levels. There small amounts of fluid and gas in the colon. There is some rectal gas. No free extraluminal gas collections are observed. The bones are osteopenic. There surgical clips in the pelvis from previous prostatectomy and lymph node dissection. IMPRESSION: Redevelopment of a mid to distal small bowel obstruction that appears fairly high-grade. There is no evidence of perforation. Electronically Signed   By: [name]  Martinique M.D.   On: 10-13-2015 07:46    Anti-infectives: Anti-infectives    Start      Dose/Rate Route Frequency Ordered Stop   09/18/15 2300  cefTRIAXone (ROCEPHIN) 1 g in dextrose 5 % 50 mL IVPB     1 g 100 mL/hr over 30 Minutes Intravenous Every 24 hours 09/18/15 2251     09/18/15 2115  cefTRIAXone (ROCEPHIN) 1 g in dextrose 5 % 50 mL IVPB     1 g 100 mL/hr over 30 Minutes Intravenous  Once 09/18/15 2106 09/18/15 2308      Assessment/Plan: SBO - for ex lap, LOA, possible bowel resection. I met with his sons yesterday and discussed the procedure, risks, and benefits. They agreed.   LOS: 5 days    Logan Day E 09/23/2015

## 2015-09-23 NOTE — Anesthesia Preprocedure Evaluation (Addendum)
Anesthesia Evaluation  Patient identified by MRN, date of birth, ID band Patient awake    Reviewed: Allergy & Precautions, H&P , Patient's Chart, lab work & pertinent test results, reviewed documented beta blocker date and time   Airway Mallampati: II  TM Distance: >3 FB Neck ROM: full    Dental no notable dental hx.    Pulmonary former smoker,    Pulmonary exam normal breath sounds clear to auscultation       Cardiovascular hypertension,  Rhythm:regular Rate:Normal     Neuro/Psych    GI/Hepatic   Endo/Other    Renal/GU      Musculoskeletal   Abdominal   Peds  Hematology  (+) anemia ,   Anesthesia Other Findings Hypertension    Atrial fibrillation rate well controlled     Hypercholesteremia    MI (myocardial infarction) ([place])  x 2  Angina    Cancer ([place])  prostate cancer   Coronary artery disease   Circ stent (2009 cath patent) Prostate cancer ([place])     Hyperlipidemia    TIA (transient ischemic attack)     GERD (gastroesophageal reflux disease)    Shortness of breath dyspnea     COPD   98% RA O2 sat   Dementia     Congestive heart failure     GI bleed 08/2015      Reproductive/Obstetrics                           Anesthesia Physical Anesthesia Plan  ASA: III  Anesthesia Plan: General   Post-op Pain Management:    Induction: Intravenous, Rapid sequence and Cricoid pressure planned  Airway Management Planned: Oral ETT and Video Laryngoscope Planned  Additional Equipment:   Intra-op Plan:   Post-operative Plan: Possible Post-op intubation/ventilation  Informed Consent: I have reviewed the patients History and Physical, chart, labs and discussed the procedure including the risks, benefits and alternatives for the proposed anesthesia with the patient or authorized representative who has indicated his/her understanding and acceptance.   Dental Advisory Given  and Dental advisory given  Plan Discussed with: CRNA and Surgeon  Anesthesia Plan Comments: (Sub-glottic ETT  Discussed general anesthesia, including possible nausea, instrumentation of airway, sore throat,pulmonary aspiration, etc. I asked if the were any outstanding questions, or  concerns before we proceeded. )       Anesthesia Quick Evaluation

## 2015-09-23 NOTE — Progress Notes (Signed)
Report given to caroline rn as caregiver 

## 2015-09-23 NOTE — Transfer of Care (Signed)
Immediate Anesthesia Transfer of Care Note  Patient: Logan Day  Procedure(s) Performed: Procedure(s): EXPLORATORY LAPAROTOMY (N/A) LYSIS OF ADHESION  (N/A)  Patient Location: PACU  Anesthesia Type:General  Level of Consciousness: awake, alert , patient cooperative and confused  Airway & Oxygen Therapy: Patient Spontanous Breathing and Patient connected to face mask oxygen  Post-op Assessment: Report given to RN, Post -op Vital signs reviewed and stable and Patient moving all extremities  Post vital signs: Reviewed and stable  Last Vitals:  Filed Vitals:   09/23/15 0533 09/23/15 1050  BP: 143/68 160/68  Pulse: 85 83  Temp: 37.1 C 36.7 C  Resp: 18 12    Complications: No apparent anesthesia complications

## 2015-09-23 NOTE — Progress Notes (Signed)
Paged PA to come look at increased abdominal dressing drainage. PA removed dressing; 4X4 and paper tape reapplied. Will continue to monitor.

## 2015-09-23 NOTE — Op Note (Signed)
09/18/2015 - 09/23/2015  10:42 AM  PATIENT:  Logan Day  82 y.o. male  PRE-OPERATIVE DIAGNOSIS:  bowel obstruction  POST-OPERATIVE DIAGNOSIS:  small bowel obstruction  PROCEDURE:  Procedure(s): EXPLORATORY LAPAROTOMY LYSIS OF ADHESION   SURGEON:  Surgeon(s): Georganna Skeans, MD  ASSISTANTS: Rosalita Levan, PAC  ANESTHESIA:   local and general  EBL:  Total I/O In: 250 [IV Piggyback:250] Out: [id number] [Urine:350; Other:1400; Blood:5]  BLOOD ADMINISTERED:none  DRAINS: none   SPECIMEN:  No Specimen  DISPOSITION OF SPECIMEN:  N/A  COUNTS:  YES  DICTATION: .Dragon Dictation Findings: single band across a loop of ileum  Procedure in detail: Gunner is brought for exploratory laparotomy for small bowel obstruction. He was identified in the preop holding area. Informed consent was obtained. He received intravenous antibiotics. He was brought to the operating room and general endotracheal anesthesia was administered by the anesthesia staff. His abdomen was prepped and draped in sterile fashion. Time out procedure was performed. Midline incision was made and subcutaneous tissues were dissected down revealing the anterior fascia. This was divided above the umbilicus away from previous scar.. No cavity was entered under direct vision. There was moderate ascites. Fascia was opened to the length of the incision. The ascites was drained. Proximal bowel was noted to be dilated. I began delivering the bowel up into the incision and a adhesion which was going across a loop of ileum came free. This was the spine of obstruction. Once this adhesion was taken down, the bowel was further run and no other adhesions were noted. The cecum was soft without evidence of mass. The small bowel was again run back from the terminal ileum to the ligament of Treitz and there were no other adhesions. Some of the enteric content was milked back towards the stomach and evacuated by the NG tube placed by anesthesia. The  abdomen was irrigated. There is no significant bleeding. Bowel was returned to anatomic position. Fascia was closed with running #1 looped PDS. Skin was irrigated. Local anesthetic was injected along the wound and the skin was closed with staples. A sterile dressing was applied. All counts were correct. He tolerated the procedure without apparent complication was taken recovery in stable condition. PATIENT DISPOSITION:  PACU - hemodynamically stable.   Delay start of Pharmacological VTE agent (>24hrs) due to surgical blood loss or risk of bleeding:  no  Georganna Skeans, MD, MPH, FACS Pager: [phone]  2/21/201710:42 AM

## 2015-09-23 NOTE — Progress Notes (Signed)
[facility]                                                                              Patient Demographics  Male Logan Day, is a 82 y.o. male, DOB - 1933/04/10, EO:[id number]  Admit date - 09/18/2015   Admitting Physician Rise Patience, MD  Outpatient Primary MD for the patient is No primary care provider on file.  LOS - 5   Chief Complaint  Patient presents with  . Abdominal Pain       Brief HPI   MITHCELL Day is a 82 y.o. male who was recently admitted for GI bleed and at that time patient's xarelto was discontinued presented to the ER because of abdominal pain and nausea vomiting. Patient reported that he had a large bowel movement this morning and later started developing abdominal pain in the lower quadrants with multiple episodes of nausea and vomiting. CT abdomen and pelvis shows high-grade bowel obstruction at the ileocecal junction. On-call surgeon Dr. Grandville Silos was consulted and the patient was placed on NG tube and admitted for further management.   Patient was admitted with SBO, initial conservative management with NGT, NPO, IVF. However patient pulled his NG tube on 2/18, repeat abdominal x-ray showed redevelopment of high-grade small bowel obstruction. Planned for OR today on 2/21.  Assessment & Plan     SBO (small bowel obstruction) ([place]) --NG tube was placed on admission however patient pulled it out on 2/18.  -  CT abdomen showed high-grade small bowel obstruction at the ED a cecal junction ? Edema versus mass - Gen. surgery following, was placed on clears on 2/19, however did not tolerate. Abdominal x-ray on 2/20 showed the development of high-grade small bowel obstruction. - GI also following, no plans of colonoscopy - Surgery following, plan for OR today  Left upper extremity DVT - Doppler ultrasound of the left approximately showed acute DVT in the left brachial vein - Patient was placed on IV heparin drip, per patient's son  patient has not tolerated anticoagulation in the past and had GI bleed. Patient appeared to have reddish color emesis on 2/20, however just had red Jell-O before the emesis. Heparin drip was discontinued. I discussed with vascular surgery, Dr. Donnetta Hutching who recommended no IVC filter, discussed with IR who also recommended no IVC filter needed in this situation as left brachial DVT rarely ever extend to PE.    Essential hypertension - Currently stable, continue IV hydralazine as needed with parameters    PAF (paroxysmal atrial fibrillation) (HCC) - Currently rate controlled,  - [place] vasc 7, however anticoagulation was discontinued during previous admission due to recent GI bleed - Discussed in detail with patient's son at the bedside, understands the risk of possible CVA in future however no further anticoagulation     Acute on CKD (chronic kidney disease), stage II likely due to #1 - Continue gentle hydration, creatinine stable  Klebsiella pneumonia UTI (lower urinary tract infection) -  continue Rocephin per sensitivities  Chronic systolic CHF - Currently compensated, however decrease IV fluids to 60 mL an hour -  echo 4/16  EF 40-45%,  COPD -  Currently stable, not wheezing  Acute encephalopathy on Dementia - Likely worsened due to UTI and SBO - Continue to hold Wellbutrin, Xanax, Zoloft, trazodone for now. Placed on IV Ativan as needed, no Haldol due to QTC prolongation  Recent GI bleed - Currently H&H stable, continue IV PPI  Urinary retention - cont Foley catheter - Placed on Flomax  Code Status: DO NOT RESUSCITATE   Family Communication: Discussed with both sons at the bedside in detail on 2/20  Disposition Plan:   Time Spent in minutes: 11mins   Procedures  CT abd NGT suction  Consults   Surgery GI   DVT Prophylaxis  SCD's  Medications  Scheduled Meds: . [facility] Hold] antiseptic oral rinse  7 mL Mouth Rinse q12n4p  . [MAR Hold] cefTRIAXone (ROCEPHIN)  IV  1 g  Intravenous Q24H  . [MAR Hold] chlorhexidine  15 mL Mouth Rinse BID  . [MAR Hold] diatrizoate meglumine-sodium  90 mL Per NG tube Once  . [MAR Hold] pantoprazole (PROTONIX) IV  40 mg Intravenous Q12H   Continuous Infusions: . sodium chloride 60 mL/hr at 09/23/15 0445  . lactated ringers     PRN Meds:.0.9 % irrigation (POUR BTL), [MAR Hold] acetaminophen **OR** [MAR Hold] acetaminophen, [MAR Hold] albuterol, bupivacaine-epinephrine, [MAR Hold] hydrALAZINE, [MAR Hold] LORazepam, [MAR Hold]  morphine injection, [MAR Hold] nitroGLYCERIN, [DISCONTINUED] ondansetron **OR** [MAR Hold] ondansetron (ZOFRAN) IV   Antibiotics   Anti-infectives    Start     Dose/Rate Route Frequency Ordered Stop   09/18/15 2300  [MAR Hold]  cefTRIAXone (ROCEPHIN) 1 g in dextrose 5 % 50 mL IVPB     (MAR Hold since 09/23/15 0902)   1 g 100 mL/hr over 30 Minutes Intravenous Every 24 hours 09/18/15 2251     09/18/15 2115  cefTRIAXone (ROCEPHIN) 1 g in dextrose 5 % 50 mL IVPB     1 g 100 mL/hr over 30 Minutes Intravenous  Once 09/18/15 2106 09/18/15 2308        Subjective:   Logan Day was seen and examined today Prior to surgery. Denies any specific complaints. Has dementia. No abdominal pain, fevers or chills. No nausea, vomiting or diarrhea.   Objective:   Filed Vitals:   09/22/15 0916 09/22/15 1126 09/22/15 2139 09/23/15 0533  BP: 130/62 134/74 132/50 143/68  Pulse: 75 81 80 85  Temp:  98.6 F (37 C) 99.4 F (37.4 C) 98.7 F (37.1 C)  TempSrc:  Oral Oral Oral  Resp:   18 18  Height:      Weight:    70.943 kg (156 lb 6.4 oz)  SpO2:  94% 93% 96%    Intake/Output Summary (Last 24 hours) at 09/23/15 1040 Last data filed at 09/23/15 1030  Gross per 24 hour  Intake    490 ml  Output   2955 ml  Net  -2465 ml     Wt Readings from Last 3 Encounters:  09/23/15 70.943 kg (156 lb 6.4 oz)  08/27/15 71.3 kg (157 lb 3 oz)  06/10/15 71.668 kg (158 lb)     Exam  General: Alert and oriented x  self, confused   HEENT:    Neck: Supple  CVS: S1 S2 clear Regular rate and rhythm.  Respiratory: Decreased breath sounds at the bases  Abdomen: Soft, mildly tender, hyperactive bowel sounds  Ext: no cyanosis clubbing or edema  Neuro: moving all 4 extremities  Skin: No rashes  Psych: calm and cooperative, oriented to self   Data Review  Micro Results Recent Results (from the past 240 hour(s))  Urine culture     Status: None   Collection Time: 09/18/15  6:56 PM  Result Value Ref Range Status   Specimen Description URINE, CLEAN CATCH  Final   Special Requests NONE  Final   Culture >=100,000 COLONIES/mL KLEBSIELLA PNEUMONIAE  Final   Report Status 09/21/2015 FINAL  Final   Organism ID, Bacteria KLEBSIELLA PNEUMONIAE  Final      Susceptibility   Klebsiella pneumoniae - MIC*    AMPICILLIN >=32 RESISTANT Resistant     CEFAZOLIN <=4 SENSITIVE Sensitive     CEFTRIAXONE <=1 SENSITIVE Sensitive     CIPROFLOXACIN <=0.25 SENSITIVE Sensitive     GENTAMICIN <=1 SENSITIVE Sensitive     IMIPENEM <=0.25 SENSITIVE Sensitive     NITROFURANTOIN 32 SENSITIVE Sensitive     TRIMETH/SULFA <=20 SENSITIVE Sensitive     AMPICILLIN/SULBACTAM 4 SENSITIVE Sensitive     PIP/TAZO <=4 SENSITIVE Sensitive     * >=100,000 COLONIES/mL KLEBSIELLA PNEUMONIAE  MRSA PCR Screening     Status: None   Collection Time: 09/19/15  1:09 AM  Result Value Ref Range Status   MRSA by PCR NEGATIVE NEGATIVE Final    Comment:        The GeneXpert MRSA Assay (FDA approved for NASAL specimens only), is one component of a comprehensive MRSA colonization surveillance program. It is not intended to diagnose MRSA infection nor to guide or monitor treatment for MRSA infections.     Radiology Reports Ct Abdomen Pelvis Wo Contrast  09/18/2015  CLINICAL DATA:  82 year old male with left flank pain radiating into the left lower quadrant with associated vomiting EXAM: CT ABDOMEN AND PELVIS WITHOUT CONTRAST TECHNIQUE:  Multidetector CT imaging of the abdomen and pelvis was performed following the standard protocol without IV contrast. COMPARISON:  Prior CT scan of the chest including the upper abdomen 11/30/2014 FINDINGS: Lower Chest: Small right-sided pleural effusion and associated right lower lobe atelectasis. Background of emphysema and chronic interstitial prominence. The lungs are hyperinflated. Borderline cardiomegaly. No pericardial effusion. Unremarkable imaged distal thoracic esophagus. Abdomen: Unenhanced CT was performed per clinician order. Lack of IV contrast limits sensitivity and specificity, especially for evaluation of abdominal/pelvic solid viscera. Within these limitations, unremarkable CT appearance of the stomach, duodenum, spleen, adrenal glands and pancreas. Normal hepatic contour morphology. No discrete hepatic lesion. Small stones layer within the gallbladder lumen. No intra or extrahepatic biliary ductal dilatation. Nonobstructing nephrolithiasis bilaterally. Nonspecific perinephric stranding bilaterally. No evidence of hydronephrosis. Multiple loops of dilated and fluid-filled small bowel throughout the abdomen. The source of obstruction appears to be the ileocecal junction. There is diffuse submucosal edema of the cecum in the region of the ileocecal junction. Evaluations significantly limited in the absence of intravenous contrast material. The appendix is identified and is normal. There is associated mesenteric edema and small volume ascites. Colonic diverticular disease without CT evidence of active inflammation. Pelvis: Surgical changes of prior prostatectomy. The bladder is unremarkable. Musculoskeletal: No acute fracture or aggressive appearing lytic or blastic osseous lesion. Vascular: Limited evaluation in the absence of intravenous contrast. Moderate atherosclerotic vascular calcifications without aneurysm. IMPRESSION: 1. High-grade distal small bowel obstruction. The location of the  obstruction appears to be at the ileocecal junction were there is diffuse submucosal edema involving the cecum. Differential considerations include terminal ileitis, typhlitis with secondary obstruction, and potentially an obstructing ileocecal mass. Evaluation is limited in the absence of intravenous contrast. There is associated mesenteric edema and small volume ascites  related to this process. 2. Bilateral nonobstructing nephrolithiasis. 3. The appendix is identified and is normal. 4. Colonic diverticular disease without CT evidence of active inflammation. 5. Pulmonary emphysema and chronic interstitial prominence. 6. Small right-sided pleural effusion. 7. Borderline cardiomegaly. 8. Cholelithiasis without evidence of acute cholecystitis. 9. Nonspecific perinephric stranding bilaterally consistent with the clinical history of renal dysfunction. 10. Surgical changes of prior prostatectomy. 11. Atherosclerotic vascular calcifications. Electronically Signed   By: Jacqulynn Cadet M.D.   On: 09/18/2015 20:50   Dg Chest 2 View  08/26/2015  CLINICAL DATA:  Headache, prostate cancer, pale EXAM: CHEST  2 VIEW COMPARISON:  05/19/2015 FINDINGS: Cardiomediastinal silhouette is stable. No acute infiltrate or pleural effusion. No pulmonary edema. Osteopenia and mild degenerative changes thoracic spine. Hyperinflation again noted. IMPRESSION: No active disease. Hyperinflation again noted. Osteopenia and mild degenerative changes thoracic spine. Electronically Signed   By: Lahoma Crocker M.D.   On: 08/26/2015 14:50   Dg Abd 1 View  08/26/2015  CLINICAL DATA:  82 year old male with constipation. EXAM: ABDOMEN - 1 VIEW COMPARISON:  None. FINDINGS: The bowel gas pattern is unremarkable. There is no evidence of bowel obstruction. A small amount of stool in the colon and rectum noted. Surgical clips in the pelvis are present. IMPRESSION: Unremarkable bowel gas pattern. Electronically Signed   By: Margarette Canada M.D.   On: 08/26/2015  18:48   Ct Head Wo Contrast  08/26/2015  CLINICAL DATA:  Altered mental status and headache today. Initial encounter. EXAM: CT HEAD WITHOUT CONTRAST TECHNIQUE: Contiguous axial images were obtained from the base of the skull through the vertex without intravenous contrast. COMPARISON:  Brain MRI 07/16/2011.  Head CT scan 07/15/2011. FINDINGS: The brain is atrophic with extensive chronic microvascular ischemic change. Remote bilateral posterior temporal and parietal infarcts are identified. No evidence of acute infarction, hemorrhage, mass lesion, midline shift or abnormal extra-axial fluid collection is seen. No pneumocephalus or hydrocephalus. The calvarium is intact. Mucous retention cysts or polyps right maxillary sinus noted. Mild ethmoid air cell disease is seen on the right. IMPRESSION: No acute abnormality. Remote bilateral parietotemporal infarcts. Atrophy and chronic microvascular ischemic change. Electronically Signed   By: Inge Rise M.D.   On: 08/26/2015 16:24   Dg Chest Portable 1 View  09/18/2015  CLINICAL DATA:  Generalized abdominal pain. EXAM: PORTABLE CHEST 1 VIEW COMPARISON:  August 26, 2015 FINDINGS: The cardiomediastinal silhouette is stable. A torturous thoracic aorta is again identified. No pneumothorax. No pulmonary nodules, masses, or focal infiltrates. IMPRESSION: No active disease. Electronically Signed   By: Dorise Bullion III M.D   On: 09/18/2015 18:08   Dg Abd 2 Views  09/22/2015  CLINICAL DATA:  Follow-up of small bowel obstruction EXAM: ABDOMEN - 2 VIEW COMPARISON:  Acute abdominal series dated February 16, 2016 FINDINGS: There has been reaccumulation of considerable gas within mildly distended small bowel loops in the mid and upper abdomen. There are numerous air-fluid levels. There small amounts of fluid and gas in the colon. There is some rectal gas. No free extraluminal gas collections are observed. The bones are osteopenic. There surgical clips in the pelvis from  previous prostatectomy and lymph node dissection. IMPRESSION: Redevelopment of a mid to distal small bowel obstruction that appears fairly high-grade. There is no evidence of perforation. Electronically Signed   By: [name]  Martinique M.D.   On: 09/22/2015 07:46   Dg Abd 2 Views  09/19/2015  CLINICAL DATA:  Lower abdominal pain, small-bowel obstruction EXAM: ABDOMEN - 2  VIEW COMPARISON:  09/18/2015 FINDINGS: NG tube tip is in the proximal stomach with the side port in the distal esophagus. Nonobstructive bowel gas pattern. No free air organomegaly. No suspicious calcification. Surgical clips in the lower pelvis. IMPRESSION: NG tube tip in the proximal stomach with the side port in the distal esophagus. No evidence of obstruction or free air. Electronically Signed   By: Rolm Baptise M.D.   On: 09/19/2015 11:45   Dg Abd Portable 1v  09/18/2015  CLINICAL DATA:  Abdominal pain for 1 day EXAM: PORTABLE ABDOMEN - 1 VIEW COMPARISON:  08/26/2015 FINDINGS: Scattered large and small bowel gas is noted. No obstructive changes are noted. Postoperative changes are seen. No free air is noted. No acute bony abnormality is noted. IMPRESSION: No acute abnormality seen. Electronically Signed   By: Inez Catalina M.D.   On: 09/18/2015 18:12    CBC  Recent Labs Lab 09/18/15 1742 09/19/15 0547 09/20/15 1139 09/21/15 0413 09/22/15 0110 09/23/15 0447  WBC 9.4 11.6* 9.7 7.9 8.0 7.2  HGB 11.5* 11.5* 11.2* 10.9* 10.5* 10.7*  HCT 36.3* 37.7* 36.5* 35.4* 33.2* 33.9*  PLT 283 286 284 248 180 247  MCV 91.2 91.7 92.9 92.9 92.5 93.4  MCH 28.9 28.0 28.5 28.6 29.2 29.5  MCHC 31.7 30.5 30.7 30.8 31.6 31.6  RDW 19.0* 19.1* 19.7* 19.9* 19.9* 19.5*  LYMPHSABS 0.9  --   --   --   --   --   MONOABS 0.4  --   --   --   --   --   EOSABS 0.2  --   --   --   --   --   BASOSABS 0.0  --   --   --   --   --     Chemistries   Recent Labs Lab 09/18/15 1742 09/19/15 0547 09/21/15 0413 09/22/15 0110 09/23/15 0447  NA 138 138 144  143 143  K 4.2 3.9 3.5 3.9 3.3*  CL 102 104 111 110 111  CO2 21* 23 24 22 23   GLUCOSE 124* 133* 91 97 91  BUN 18 17 19 18 13   CREATININE 1.74* 1.60* 1.59* 1.34* 1.36*  CALCIUM 9.1 8.5* 8.3* 8.4* 8.1*  AST 42*  --   --   --   --   ALT 28  --   --   --   --   ALKPHOS 63  --   --   --   --   BILITOT 0.6  --   --   --   --    ------------------------------------------------------------------------------------------------------------------ estimated creatinine clearance is 43.4 mL/min (by C-G formula based on Cr of 1.36). ------------------------------------------------------------------------------------------------------------------ No results for input(s): HGBA1C in the last 72 hours. ------------------------------------------------------------------------------------------------------------------ No results for input(s): CHOL, HDL, LDLCALC, TRIG, CHOLHDL, LDLDIRECT in the last 72 hours. ------------------------------------------------------------------------------------------------------------------ No results for input(s): TSH, T4TOTAL, T3FREE, THYROIDAB in the last 72 hours.  Invalid input(s): FREET3 ------------------------------------------------------------------------------------------------------------------ No results for input(s): VITAMINB12, FOLATE, FERRITIN, TIBC, IRON, RETICCTPCT in the last 72 hours.  Coagulation profile No results for input(s): INR, PROTIME in the last 168 hours.  No results for input(s): DDIMER in the last 72 hours.  Cardiac Enzymes  Recent Labs Lab 09/18/15 1742  TROPONINI <0.03   ------------------------------------------------------------------------------------------------------------------ Invalid input(s): [place]   Recent Labs  09/21/15 2322 09/22/15 0723 09/22/15 1125 09/22/15 1833 09/22/15 2342 09/23/15 0538  GLUCAP 89 109* 104* 13 82 55     Litisha Guagliardo M.D. [facility] 09/23/2015, 10:40 AM  Pager:  [phone] Between 7am to 7pm - call Pager - [phone]  After 7pm go to [email or web address] - password [email or web address]  Call night coverage person covering after 7pm

## 2015-09-24 ENCOUNTER — Encounter (HOSPITAL_COMMUNITY): Payer: Self-pay | Admitting: General Surgery

## 2015-09-24 DIAGNOSIS — I10 Essential (primary) hypertension: Secondary | ICD-10-CM

## 2015-09-24 DIAGNOSIS — N182 Chronic kidney disease, stage 2 (mild): Secondary | ICD-10-CM

## 2015-09-24 DIAGNOSIS — N390 Urinary tract infection, site not specified: Secondary | ICD-10-CM

## 2015-09-24 DIAGNOSIS — I480 Paroxysmal atrial fibrillation: Secondary | ICD-10-CM

## 2015-09-24 DIAGNOSIS — K5669 Other intestinal obstruction: Secondary | ICD-10-CM

## 2015-09-24 LAB — GLUCOSE, CAPILLARY
GLUCOSE-CAPILLARY: 91 mg/dL (ref 65–99)
Glucose-Capillary: 92 mg/dL (ref 65–99)
Glucose-Capillary: 95 mg/dL (ref 65–99)
Glucose-Capillary: 89 mg/dL (ref 65–99)

## 2015-09-24 NOTE — Progress Notes (Signed)
[facility]                                                                              Patient Demographics  Logan Day, is a 82 y.o. male, DOB - 01/12/1933, VX:[id number]  Admit date - 09/18/2015   Admitting Physician Rise Patience, MD  Outpatient Primary MD for the patient is No primary care provider on file.  LOS - 6   Chief Complaint  Patient presents with  . Abdominal Pain      HPI on 09/18/2015 by Dr. Gean Birchwood Logan Day is a 82 y.o. male who was recently admitted for GI bleed and at that time patient's xarelto was discontinued presents to the ER because of abdominal pain and nausea vomiting. Patient states he had a large bowel movement this morning and later started developing abdominal pain in the lower quadrants. Had multiple episodes of nausea and vomiting. CT abdomen and pelvis shows high-grade bowel obstruction at the ileocecal junction. On-call surgeon Dr. Grandville Silos was consulted and the patient was placed on NG tube and admitted for further management. Patient otherwise denies any chest pain or shortness of breath.  Assessment & Plan   Small bowel obstruction -CT abdomen showed high-grade small bowel obstruction -Gen. surgery consulted and appreciated -Gastroenterology consulted and appreciated, no plans for colonoscopy -NG tube was placed twice over patient pulled it out -s/p exploratory laparotomy with lysis of adhesions  Left upper extremity DVT -Noted on ultrasound, left brachial vein -Patient was placed on heparin drip however patient has had GI bleed in the past -Previous hospitalist with vascular surgery, Dr. early as well as IR, no recommendations for IVC filter placement  Essential hypertension -Stable continue IV hydralazine as needed  Paroxysmal atrial fibrillation -Currently rate controlled -CHADSVASC 7 -Currently no anticoagulation secondary to GI bleed  Acute on chronic kidney disease, stage II -Creatinine  1.36 -Continue to monitor BMP  Klebsiella pneumonia UTI -Urine culture mainly pansensitive except to ampicillin -Continue ceftriaxone  Chronic Systolic heart failure -Echocardiogram -Monitor intake and output, daily weights  COPD -Currently stable  Acute encephalopathy/dementia -Worsening probably secondary to SBO and UTI -Wellbutrin, Xanax, Zoloft, trazodone held -Continue IV Ativan as needed -No Haldol secondary to QTC prolongation  Recent GI bleed -Hemoglobin 10.7 -Continue monitor CBC  Urinary retention -Continue Flomax and Foley catheter  Code Status: DO NOT RESUSCITATE  Family Communication: None at bedside  Disposition Plan: Admitted.   Time Spent in minutes   30 minutes  Procedures  NG tube placement Exploratory laparotomy with lysis of adhesions  Consults   General surgery Gastroenterology Vascular surgery/IR via phone by Dr. Tana Coast  DVT Prophylaxis  SCDs  Lab Results  Component Value Date   PLT 247 09/23/2015    Medications  Scheduled Meds: . antiseptic oral rinse  7 mL Mouth Rinse q12n4p  . chlorhexidine  15 mL Mouth Rinse BID  . diatrizoate meglumine-sodium  90 mL Per NG tube Once  . pantoprazole (PROTONIX) IV  40 mg Intravenous Q12H   Continuous Infusions: . sodium chloride 60 mL/hr at 09/23/15 2325  . lactated ringers Stopped (09/23/15 1340)   PRN Meds:.acetaminophen **OR** acetaminophen, albuterol, hydrALAZINE, LORazepam, morphine injection, nitroGLYCERIN, [DISCONTINUED]  ondansetron **OR** ondansetron (ZOFRAN) IV  Antibiotics    Anti-infectives    Start     Dose/Rate Route Frequency Ordered Stop   09/18/15 2300  cefTRIAXone (ROCEPHIN) 1 g in dextrose 5 % 50 mL IVPB  Status:  Discontinued     1 g 100 mL/hr over 30 Minutes Intravenous Every 24 hours 09/18/15 2251 09/23/15 1239   09/18/15 2115  cefTRIAXone (ROCEPHIN) 1 g in dextrose 5 % 50 mL IVPB     1 g 100 mL/hr over 30 Minutes Intravenous  Once 09/18/15 2106 09/18/15 2308       Subjective:   Sharon Bente seen and examined today.  Patient has dementia. Currently. Denies any abdominal pain, gas passage. Very restless and would like to sleep.  Objective:   Filed Vitals:   09/24/15 0003 09/24/15 0315 09/24/15 0456 09/24/15 1242  BP: 164/80 164/91 144/88 147/70  Pulse: 84  99 93  Temp: 98.4 F (36.9 C)  98.4 F (36.9 C) 98.8 F (37.1 C)  TempSrc: Oral  Oral Oral  Resp: 17  16 20   Height:      Weight:   70.823 kg (156 lb 2.2 oz)   SpO2: 98%  93% 92%    Wt Readings from Last 3 Encounters:  09/24/15 70.823 kg (156 lb 2.2 oz)  08/27/15 71.3 kg (157 lb 3 oz)  06/10/15 71.668 kg (158 lb)     Intake/Output Summary (Last 24 hours) at 09/24/15 1339 Last data filed at 09/24/15 1026  Gross per 24 hour  Intake    927 ml  Output   1800 ml  Net   -873 ml    Exam  General: Well developed, well nourished, NAD, appears stated age  HEENT: NCAT, mucous membranes moist.   Cardiovascular: S1 S2 auscultated, no rubs, murmurs or gallops. Regular rate and rhythm.  Respiratory: Clear to auscultation bilaterally with equal chest rise  Abdomen: Soft, nontender, nondistended, few bowel sounds, dressing in place  Extremities: warm dry without cyanosis clubbing or edema  Neuro: AAOx1, nonfocal  Psych: Normal affect and demeanor    Data Review   Micro Results Recent Results (from the past 240 hour(s))  Urine culture     Status: None   Collection Time: 09/18/15  6:56 PM  Result Value Ref Range Status   Specimen Description URINE, CLEAN CATCH  Final   Special Requests NONE  Final   Culture >=100,000 COLONIES/mL KLEBSIELLA PNEUMONIAE  Final   Report Status 09/21/2015 FINAL  Final   Organism ID, Bacteria KLEBSIELLA PNEUMONIAE  Final      Susceptibility   Klebsiella pneumoniae - MIC*    AMPICILLIN >=32 RESISTANT Resistant     CEFAZOLIN <=4 SENSITIVE Sensitive     CEFTRIAXONE <=1 SENSITIVE Sensitive     CIPROFLOXACIN <=0.25 SENSITIVE Sensitive      GENTAMICIN <=1 SENSITIVE Sensitive     IMIPENEM <=0.25 SENSITIVE Sensitive     NITROFURANTOIN 32 SENSITIVE Sensitive     TRIMETH/SULFA <=20 SENSITIVE Sensitive     AMPICILLIN/SULBACTAM 4 SENSITIVE Sensitive     PIP/TAZO <=4 SENSITIVE Sensitive     * >=100,000 COLONIES/mL KLEBSIELLA PNEUMONIAE  MRSA PCR Screening     Status: None   Collection Time: 09/19/15  1:09 AM  Result Value Ref Range Status   MRSA by PCR NEGATIVE NEGATIVE Final    Comment:        The GeneXpert MRSA Assay (FDA approved for NASAL specimens only), is one component of a comprehensive MRSA colonization surveillance  program. It is not intended to diagnose MRSA infection nor to guide or monitor treatment for MRSA infections.     Radiology Reports Ct Abdomen Pelvis Wo Contrast  09/18/2015  CLINICAL DATA:  82 year old male with left flank pain radiating into the left lower quadrant with associated vomiting EXAM: CT ABDOMEN AND PELVIS WITHOUT CONTRAST TECHNIQUE: Multidetector CT imaging of the abdomen and pelvis was performed following the standard protocol without IV contrast. COMPARISON:  Prior CT scan of the chest including the upper abdomen 11/30/2014 FINDINGS: Lower Chest: Small right-sided pleural effusion and associated right lower lobe atelectasis. Background of emphysema and chronic interstitial prominence. The lungs are hyperinflated. Borderline cardiomegaly. No pericardial effusion. Unremarkable imaged distal thoracic esophagus. Abdomen: Unenhanced CT was performed per clinician order. Lack of IV contrast limits sensitivity and specificity, especially for evaluation of abdominal/pelvic solid viscera. Within these limitations, unremarkable CT appearance of the stomach, duodenum, spleen, adrenal glands and pancreas. Normal hepatic contour morphology. No discrete hepatic lesion. Small stones layer within the gallbladder lumen. No intra or extrahepatic biliary ductal dilatation. Nonobstructing nephrolithiasis bilaterally.  Nonspecific perinephric stranding bilaterally. No evidence of hydronephrosis. Multiple loops of dilated and fluid-filled small bowel throughout the abdomen. The source of obstruction appears to be the ileocecal junction. There is diffuse submucosal edema of the cecum in the region of the ileocecal junction. Evaluations significantly limited in the absence of intravenous contrast material. The appendix is identified and is normal. There is associated mesenteric edema and small volume ascites. Colonic diverticular disease without CT evidence of active inflammation. Pelvis: Surgical changes of prior prostatectomy. The bladder is unremarkable. Musculoskeletal: No acute fracture or aggressive appearing lytic or blastic osseous lesion. Vascular: Limited evaluation in the absence of intravenous contrast. Moderate atherosclerotic vascular calcifications without aneurysm. IMPRESSION: 1. High-grade distal small bowel obstruction. The location of the obstruction appears to be at the ileocecal junction were there is diffuse submucosal edema involving the cecum. Differential considerations include terminal ileitis, typhlitis with secondary obstruction, and potentially an obstructing ileocecal mass. Evaluation is limited in the absence of intravenous contrast. There is associated mesenteric edema and small volume ascites related to this process. 2. Bilateral nonobstructing nephrolithiasis. 3. The appendix is identified and is normal. 4. Colonic diverticular disease without CT evidence of active inflammation. 5. Pulmonary emphysema and chronic interstitial prominence. 6. Small right-sided pleural effusion. 7. Borderline cardiomegaly. 8. Cholelithiasis without evidence of acute cholecystitis. 9. Nonspecific perinephric stranding bilaterally consistent with the clinical history of renal dysfunction. 10. Surgical changes of prior prostatectomy. 11. Atherosclerotic vascular calcifications. Electronically Signed   By: Jacqulynn Cadet  M.D.   On: 09/18/2015 20:50   Dg Chest 2 View  08/26/2015  CLINICAL DATA:  Headache, prostate cancer, pale EXAM: CHEST  2 VIEW COMPARISON:  05/19/2015 FINDINGS: Cardiomediastinal silhouette is stable. No acute infiltrate or pleural effusion. No pulmonary edema. Osteopenia and mild degenerative changes thoracic spine. Hyperinflation again noted. IMPRESSION: No active disease. Hyperinflation again noted. Osteopenia and mild degenerative changes thoracic spine. Electronically Signed   By: Lahoma Crocker M.D.   On: 08/26/2015 14:50   Dg Abd 1 View  08/26/2015  CLINICAL DATA:  82 year old male with constipation. EXAM: ABDOMEN - 1 VIEW COMPARISON:  None. FINDINGS: The bowel gas pattern is unremarkable. There is no evidence of bowel obstruction. A small amount of stool in the colon and rectum noted. Surgical clips in the pelvis are present. IMPRESSION: Unremarkable bowel gas pattern. Electronically Signed   By: Margarette Canada M.D.   On: 08/26/2015 18:48  Ct Head Wo Contrast  08/26/2015  CLINICAL DATA:  Altered mental status and headache today. Initial encounter. EXAM: CT HEAD WITHOUT CONTRAST TECHNIQUE: Contiguous axial images were obtained from the base of the skull through the vertex without intravenous contrast. COMPARISON:  Brain MRI 07/16/2011.  Head CT scan 07/15/2011. FINDINGS: The brain is atrophic with extensive chronic microvascular ischemic change. Remote bilateral posterior temporal and parietal infarcts are identified. No evidence of acute infarction, hemorrhage, mass lesion, midline shift or abnormal extra-axial fluid collection is seen. No pneumocephalus or hydrocephalus. The calvarium is intact. Mucous retention cysts or polyps right maxillary sinus noted. Mild ethmoid air cell disease is seen on the right. IMPRESSION: No acute abnormality. Remote bilateral parietotemporal infarcts. Atrophy and chronic microvascular ischemic change. Electronically Signed   By: Inge Rise M.D.   On: 08/26/2015 16:24    Dg Chest Portable 1 View  09/18/2015  CLINICAL DATA:  Generalized abdominal pain. EXAM: PORTABLE CHEST 1 VIEW COMPARISON:  August 26, 2015 FINDINGS: The cardiomediastinal silhouette is stable. A torturous thoracic aorta is again identified. No pneumothorax. No pulmonary nodules, masses, or focal infiltrates. IMPRESSION: No active disease. Electronically Signed   By: Dorise Bullion III M.D   On: 09/18/2015 18:08   Dg Abd 2 Views  09/22/2015  CLINICAL DATA:  Follow-up of small bowel obstruction EXAM: ABDOMEN - 2 VIEW COMPARISON:  Acute abdominal series dated February 16, 2016 FINDINGS: There has been reaccumulation of considerable gas within mildly distended small bowel loops in the mid and upper abdomen. There are numerous air-fluid levels. There small amounts of fluid and gas in the colon. There is some rectal gas. No free extraluminal gas collections are observed. The bones are osteopenic. There surgical clips in the pelvis from previous prostatectomy and lymph node dissection. IMPRESSION: Redevelopment of a mid to distal small bowel obstruction that appears fairly high-grade. There is no evidence of perforation. Electronically Signed   By: [name]  Martinique M.D.   On: 09/22/2015 07:46   Dg Abd 2 Views  09/19/2015  CLINICAL DATA:  Lower abdominal pain, small-bowel obstruction EXAM: ABDOMEN - 2 VIEW COMPARISON:  09/18/2015 FINDINGS: NG tube tip is in the proximal stomach with the side port in the distal esophagus. Nonobstructive bowel gas pattern. No free air organomegaly. No suspicious calcification. Surgical clips in the lower pelvis. IMPRESSION: NG tube tip in the proximal stomach with the side port in the distal esophagus. No evidence of obstruction or free air. Electronically Signed   By: Rolm Baptise M.D.   On: 09/19/2015 11:45   Dg Abd Portable 1v  09/18/2015  CLINICAL DATA:  Abdominal pain for 1 day EXAM: PORTABLE ABDOMEN - 1 VIEW COMPARISON:  08/26/2015 FINDINGS: Scattered large and small bowel gas  is noted. No obstructive changes are noted. Postoperative changes are seen. No free air is noted. No acute bony abnormality is noted. IMPRESSION: No acute abnormality seen. Electronically Signed   By: Inez Catalina M.D.   On: 09/18/2015 18:12    CBC  Recent Labs Lab 09/18/15 1742 09/19/15 0547 09/20/15 1139 09/21/15 0413 09/22/15 0110 09/23/15 0447  WBC 9.4 11.6* 9.7 7.9 8.0 7.2  HGB 11.5* 11.5* 11.2* 10.9* 10.5* 10.7*  HCT 36.3* 37.7* 36.5* 35.4* 33.2* 33.9*  PLT 283 286 284 248 180 247  MCV 91.2 91.7 92.9 92.9 92.5 93.4  MCH 28.9 28.0 28.5 28.6 29.2 29.5  MCHC 31.7 30.5 30.7 30.8 31.6 31.6  RDW 19.0* 19.1* 19.7* 19.9* 19.9* 19.5*  LYMPHSABS 0.9  --   --   --   --   --  MONOABS 0.4  --   --   --   --   --   EOSABS 0.2  --   --   --   --   --   BASOSABS 0.0  --   --   --   --   --     Chemistries   Recent Labs Lab 09/18/15 1742 09/19/15 0547 09/21/15 0413 09/22/15 0110 09/23/15 0447  NA 138 138 144 143 143  K 4.2 3.9 3.5 3.9 3.3*  CL 102 104 111 110 111  CO2 21* 23 24 22 23   GLUCOSE 124* 133* 91 97 91  BUN 18 17 19 18 13   CREATININE 1.74* 1.60* 1.59* 1.34* 1.36*  CALCIUM 9.1 8.5* 8.3* 8.4* 8.1*  AST 42*  --   --   --   --   ALT 28  --   --   --   --   ALKPHOS 63  --   --   --   --   BILITOT 0.6  --   --   --   --    ------------------------------------------------------------------------------------------------------------------ estimated creatinine clearance is 43.4 mL/min (by C-G formula based on Cr of 1.36). ------------------------------------------------------------------------------------------------------------------ No results for input(s): HGBA1C in the last 72 hours. ------------------------------------------------------------------------------------------------------------------ No results for input(s): CHOL, HDL, LDLCALC, TRIG, CHOLHDL, LDLDIRECT in the last 72  hours. ------------------------------------------------------------------------------------------------------------------ No results for input(s): TSH, T4TOTAL, T3FREE, THYROIDAB in the last 72 hours.  Invalid input(s): FREET3 ------------------------------------------------------------------------------------------------------------------ No results for input(s): VITAMINB12, FOLATE, FERRITIN, TIBC, IRON, RETICCTPCT in the last 72 hours.  Coagulation profile No results for input(s): INR, PROTIME in the last 168 hours.  No results for input(s): DDIMER in the last 72 hours.  Cardiac Enzymes  Recent Labs Lab 09/18/15 1742  TROPONINI <0.03   ------------------------------------------------------------------------------------------------------------------ Invalid input(s): POCBNP    Geovanny Sartin D.O. on 09/24/2015 at 1:39 PM  Between 7am to 7pm - Pager - [phone]  After 7pm go to [email or web address] - password [email or web address]  And look for the night coverage person covering for me after hours  [facility] Office  [phone]

## 2015-09-24 NOTE — Progress Notes (Signed)
1 Day Post-Op  Subjective: He pulled out his NG, no nausea.  He can't really tell me if he is passing flatus.  I will leave the NG out it doesn't seem to be an issue without it right now.  I told the nurse to call if he develops nausea or vomits.    Objective: Vital signs in last 24 hours: Temp:  [98.1 F (36.7 C)-98.7 F (37.1 C)] 98.4 F (36.9 C) (02/22 0456) Pulse Rate:  [77-99] 99 (02/22 0456) Resp:  [9-17] 16 (02/22 0456) BP: (144-167)/(68-130) 144/88 mmHg (02/22 0456) SpO2:  [93 %-100 %] 93 % (02/22 0456) Weight:  [70.823 kg (156 lb 2.2 oz)] 70.823 kg (156 lb 2.2 oz) (02/22 0456) Last BM Date:  (pt does not know) Npo 1735 urine 250 from NG recorded, plus 1450 "other" Afebrile, VSS No labs today, mild renal insuffiencey on last lab, and stable. Intake/Output from previous day: 02/21 0701 - 02/22 0700 In: 2156 [I.V.:1906; IV Piggyback:250] Out: [id number] [Urine:1735; Emesis/NG output:250; Blood:5] Intake/Output this shift:    General appearance: He is awake and pretty calm right now.  No complaints and let me look at he wound and examine him without any issue. GI: soft, sore, incision looks fine.  No complaints few rare bowel sounds.  Lab Results:   Recent Labs  09/22/15 0110 09/23/15 0447  WBC 8.0 7.2  HGB 10.5* 10.7*  HCT 33.2* 33.9*  PLT 180 247    BMET  Recent Labs  09/22/15 0110 09/23/15 0447  NA 143 143  K 3.9 3.3*  CL 110 111  CO2 22 23  GLUCOSE 97 91  BUN 18 13  CREATININE 1.34* 1.36*  CALCIUM 8.4* 8.1*   PT/INR No results for input(s): LABPROT, INR in the last 72 hours.   Recent Labs Lab 09/18/15 1742  AST 42*  ALT 28  ALKPHOS 63  BILITOT 0.6  PROT 7.3  ALBUMIN 3.4*     Lipase     Component Value Date/Time   LIPASE 21 09/18/2015 1742     Studies/Results: No results found.  Medications: . antiseptic oral rinse  7 mL Mouth Rinse q12n4p  . chlorhexidine  15 mL Mouth Rinse BID  . diatrizoate meglumine-sodium  90 mL Per NG tube  Once  . pantoprazole (PROTONIX) IV  40 mg Intravenous Q12H   . sodium chloride 60 mL/hr at 09/23/15 2325  . lactated ringers Stopped (09/23/15 1340)    Assessment/Plan SBO/hx of prostatectomy S/p exploratory laparotomy with lysis of adhesions, 09/23/15, Dr. Georganna Skeans Possible ileocecal junction mass Hypertension] PAF - anticoagulation stopped for prior GI bleed Chronic kidney disease (stage II) CAD with prior Stents 2014/hx of CHF UTI COPD Acute encephalopathy with dementia DVT LUE on heparin pre op, none currently Antibiotics: Day 5 rocephin completed 09/22/15 - UTI  DNR    Plan:  I will leave his NG out for now.  Work on mobilizing and give GI tract time to recover function.  He still has foley in. i think we can restart the heparin. I would not bolus, and use till you GI tract is option for anticoagulation.    LOS: 6 days    Logan Day 09/24/2015

## 2015-09-24 NOTE — Progress Notes (Signed)
Called to room by NT that pt has pulled out NG tube. Paged Earnstine Regal PA. Ordered for NG to be left out and monitor pt for emesis and page regarding any change in status. Keep pt NPO.

## 2015-09-24 NOTE — Evaluation (Signed)
Physical Therapy Evaluation Patient Details Name: Logan Day MRN: [id number] DOB: 02-Sep-1932 Today's Date: 09/24/2015   History of Present Illness  Patient is a 82 y.o. male with history of atrial fibrillation on Xarelto, stroke, dementia. Recently presented for complaints of GI bleeding s/p ex lap presents with abdominal pain, nausea, and vomiting.  Patient also with LUE DVT.  Clinical Impression  Patient demonstrates deficits in functional mobility as indicated below. Will need continued skilled PT to address deficits and maximize function. Will see as indicated and progress as tolerated. Main limiting factors at this time are cognition and pain during mobility. Pain causing patient to mobilize with poor safety awareness and increased risk of falls. Would benefit from [facility] prior to returning to ALF.    Follow Up Recommendations SNF;Supervision/Assistance - 24 hour (if pain controlled may progress well enough to return to ALF)    Equipment Recommendations   (TBD)    Recommendations for Other Services       Precautions / Restrictions Precautions Precautions: Fall Precaution Comments: DNR Restrictions Weight Bearing Restrictions: No      Mobility  Bed Mobility Overal bed mobility: Needs Assistance Bed Mobility: Supine to Sit;Sit to Supine     Supine to sit: Min assist Sit to supine: Mod assist   General bed mobility comments: increased time to perform, increased physical assist secondary to pain in abdominal region with movement and transition  Transfers Overall transfer level: Needs assistance Equipment used: None Transfers: Sit to/from Stand Sit to Stand: Min guard         General transfer comment: min guard for safety  Ambulation/Gait Ambulation/Gait assistance: Min assist (1 episode of monderate asst to prevent fall) Ambulation Distance (Feet): 30 Feet Assistive device: 1 person hand held assist Gait Pattern/deviations: Step-through pattern;Decreased  stride length;Shuffle;Drifts right/left;Narrow base of support Gait velocity: decreased Gait velocity interpretation: Below normal speed for age/gender General Gait Details: VC for upright posture and awareness of instability with higher level dynamic gait tasks. Strays away from straight path with high marching and variable (slow) gait challenges. Minimal difficulty with quick gait speeds, vertical/horizontal head turns, and backwards stepping. No overt loss of balance noted.  Stairs            Wheelchair Mobility    Modified Rankin (Stroke Patients Only)       Balance     Sitting balance-Leahy Scale: Normal     Standing balance support: No upper extremity supported Standing balance-Leahy Scale: Fair                               Pertinent Vitals/Pain Pain Assessment: Faces Faces Pain Scale: Hurts whole lot Pain Location: abdominal pain Pain Descriptors / Indicators: Grimacing;Guarding;Moaning;Operative site guarding Pain Intervention(s): Limited activity within patient's tolerance;Monitored during session;Repositioned    Home Living Family/patient expects to be discharged to:: Assisted living Living Arrangements: Alone             Home Equipment: None      Prior Function Level of Independence: Needs assistance   Gait / Transfers Assistance Needed: Pt states that he ambulates at ALF w/o AD, however, he is notwed to reach for objects to steady himeself when walking in room. No family present during assessment this morning.  ADL's / Homemaking Assistance Needed: Pt lives at [place], he reports that meals and homemaking is done for him. He states that he dresses himself and does toileting on his own  as well. No family present during assessment.  Comments: Pt is from ALF and plans to d/c back there.      Hand Dominance   Dominant Hand: Right    Extremity/Trunk Assessment   Upper Extremity Assessment: Defer to OT evaluation           Lower  Extremity Assessment: Generalized weakness      Cervical / Trunk Assessment: Normal  Communication   Communication: Expressive difficulties;HOH  Cognition Arousal/Alertness: Awake/alert Behavior During Therapy: WFL for tasks assessed/performed Overall Cognitive Status: No family/caregiver present to determine baseline cognitive functioning                      General Comments      Exercises        Assessment/Plan    PT Assessment Patient needs continued PT services  PT Diagnosis Abnormality of gait;Generalized weakness   PT Problem List Decreased strength;Decreased activity tolerance;Decreased balance;Decreased mobility;Decreased cognition;Decreased knowledge of use of DME  PT Treatment Interventions DME instruction;Gait training;Functional mobility training;Therapeutic activities;Therapeutic exercise;Balance training;Cognitive remediation;Patient/family education   PT Goals (Current goals can be found in the Care Plan section) Acute Rehab PT Goals Patient Stated Goal: to get better PT Goal Formulation: With patient Time For Goal Achievement: 10/08/15 Potential to Achieve Goals: Good    Frequency Min 3X/week   Barriers to discharge        Co-evaluation               End of Session Equipment Utilized During Treatment: Gait belt Activity Tolerance: Patient tolerated treatment well Patient left: in bed;with call bell/phone within reach;with bed alarm set;with SCD's reapplied Nurse Communication: Mobility status         Time: [id number] PT Time Calculation (min) (ACUTE ONLY): 17 min   Charges:   PT Evaluation $PT Eval Moderate Complexity: 1 Procedure     PT G CodesDuncan Dull 10-21-2015, 4:42 PM Alben Deeds, [place] DPT  [phone]

## 2015-09-25 LAB — BASIC METABOLIC PANEL
ANION GAP: 9 (ref 5–15)
BUN: 15 mg/dL (ref 6–20)
CALCIUM: 8.3 mg/dL — AB (ref 8.9–10.3)
CO2: 23 mmol/L (ref 22–32)
Chloride: 110 mmol/L (ref 101–111)
Creatinine, Ser: 1.29 mg/dL — ABNORMAL HIGH (ref 0.61–1.24)
GFR calc Af Amer: 59 mL/min — ABNORMAL LOW (ref >60)
GFR, EST NON AFRICAN AMERICAN: 51 mL/min — AB (ref >60)
GLUCOSE: 88 mg/dL (ref 65–99)
POTASSIUM: 3.3 mmol/L — AB (ref 3.5–5.1)
SODIUM: 142 mmol/L (ref 135–145)

## 2015-09-25 LAB — GLUCOSE, CAPILLARY
GLUCOSE-CAPILLARY: 73 mg/dL (ref 65–99)
GLUCOSE-CAPILLARY: 103 mg/dL — AB (ref 65–99)
Glucose-Capillary: 74 mg/dL (ref 65–99)
Glucose-Capillary: 76 mg/dL (ref 65–99)

## 2015-09-25 LAB — CBC
HCT: 36.8 % — ABNORMAL LOW (ref 39.0–52.0)
HEMOGLOBIN: 12 g/dL — AB (ref 13.0–17.0)
MCH: 30.2 pg (ref 26.0–34.0)
MCHC: 32.6 g/dL (ref 30.0–36.0)
MCV: 92.5 fL (ref 78.0–100.0)
Platelets: 289 10*3/uL (ref 150–400)
RBC: 3.98 MIL/uL — ABNORMAL LOW (ref 4.22–5.81)
RDW: 19.3 % — AB (ref 11.5–15.5)
WBC: 9.1 10*3/uL (ref 4.0–10.5)

## 2015-09-25 LAB — MAGNESIUM: Magnesium: 1.5 mg/dL — ABNORMAL LOW (ref 1.7–2.4)

## 2015-09-25 MED ORDER — MAGNESIUM SULFATE 2 GM/50ML IV SOLN
2.0000 g | Freq: Once | INTRAVENOUS | Status: AC
  Administered 2015-09-25: 2 g via INTRAVENOUS
  Filled 2015-09-25: qty 50

## 2015-09-25 MED ORDER — OXYCODONE HCL 5 MG/5ML PO SOLN
5.0000 mg | ORAL | Status: DC | PRN
  Administered 2015-09-25 – 2015-09-26 (×3): 5 mg via ORAL
  Administered 2015-09-27: 10 mg via ORAL
  Administered 2015-09-27 – 2015-09-28 (×2): 5 mg via ORAL
  Administered 2015-09-29: 10 mg via ORAL
  Filled 2015-09-25: qty 5
  Filled 2015-09-25: qty 10
  Filled 2015-09-25 (×3): qty 5
  Filled 2015-09-25: qty 10
  Filled 2015-09-25 (×3): qty 5

## 2015-09-25 MED ORDER — ACETAMINOPHEN 160 MG/5ML PO SOLN
1000.0000 mg | Freq: Four times a day (QID) | ORAL | Status: DC
  Administered 2015-09-25 – 2015-09-29 (×13): 1000 mg via ORAL
  Filled 2015-09-25 (×12): qty 40.6

## 2015-09-25 MED ORDER — POTASSIUM CHLORIDE IN NACL 40-0.9 MEQ/L-% IV SOLN
INTRAVENOUS | Status: DC
  Administered 2015-09-25 – 2015-09-27 (×4): 75 mL/h via INTRAVENOUS
  Filled 2015-09-25 (×7): qty 1000

## 2015-09-25 NOTE — Care Management Important Message (Signed)
Important Message  Patient Details  Name: WILMER SULLA MRN: [id number] Date of Birth: 1932/08/19   Medicare Important Message Given:  Yes    Marionna Gonia P Indonesia Mckeough 09/25/2015, 4:32 PM

## 2015-09-25 NOTE — Progress Notes (Signed)
2 Days Post-Op  Subjective: He looks fine, complains of pain to his son with moving in bed.  Has not really been up much.  He can't really tell me whether he is having flatus or not.  Incision looks fine.  Objective: Vital signs in last 24 hours: Temp:  [98.6 F (37 C)-99.3 F (37.4 C)] 98.6 F (37 C) (02/23 0500) Pulse Rate:  [92-95] 95 (02/23 0537) Resp:  [20] 20 (02/23 0537) BP: (147-157)/(70-86) 154/86 mmHg (02/23 0537) SpO2:  [92 %-94 %] 94 % (02/23 0537) Weight:  [68.765 kg (151 lb 9.6 oz)] 68.765 kg (151 lb 9.6 oz) (02/23 0537) Last BM Date:  (pt does not know) NPO Urine 1275 Afebrile, VSS K+ 3.0 Creatinine is stable . sodium chloride 60 mL/hr at 09/23/15 2325  . lactated ringers 50 mL/hr at 09/25/15 0557   Intake/Output from previous day: 02/22 0701 - 02/23 0700 In: 981.5 [I.V.:981.5] Out: 1275 [Urine:1275] Intake/Output this shift:    General appearance: alert, cooperative and no distress GI: soft, not distended, sore, few BS.  Lab Results:   Recent Labs  09/23/15 0447 09/25/15 0613  WBC 7.2 9.1  HGB 10.7* 12.0*  HCT 33.9* 36.8*  PLT 247 289    BMET  Recent Labs  09/23/15 0447 09/25/15 0613  NA 143 142  K 3.3* 3.3*  CL 111 110  CO2 23 23  GLUCOSE 91 88  BUN 13 15  CREATININE 1.36* 1.29*  CALCIUM 8.1* 8.3*   PT/INR No results for input(s): LABPROT, INR in the last 72 hours.   Recent Labs Lab 09/18/15 1742  AST 42*  ALT 28  ALKPHOS 63  BILITOT 0.6  PROT 7.3  ALBUMIN 3.4*     Lipase     Component Value Date/Time   LIPASE 21 09/18/2015 1742     Studies/Results: No results found.  Medications: . antiseptic oral rinse  7 mL Mouth Rinse q12n4p  . chlorhexidine  15 mL Mouth Rinse BID  . diatrizoate meglumine-sodium  90 mL Per NG tube Once  . pantoprazole (PROTONIX) IV  40 mg Intravenous Q12H    Assessment/Plan SBO/hx of prostatectomy S/p exploratory laparotomy with lysis of adhesions, 09/23/15, Dr. 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  Son says they are going to take foley out and start mobilizing him in the room at least.  If we can get him walking the halls that would be even better.  Replace K+ and get  It up to 4.0 range would also be helpful.  He got 2 mg of morphine yesterday and 3 mg of Ativan.  This may explain his pain issue.  I will start him on some PO liquid pain medicine and see if this helps.  I added K+ to his IV, ordered Mag, and will recheck tomorrow.  LOS: 7 days    Logan Day 09/25/2015

## 2015-09-25 NOTE — Progress Notes (Signed)
[facility]                                                                              Patient Demographics  Logan Day, is a 82 y.o. male, DOB - [birth year], [id number]  Admit date - 09/18/2015   Admitting Physician Rise Patience, MD  Outpatient Primary MD for the patient is No primary care provider on file.  LOS - 7   Chief Complaint  Patient presents with  . Abdominal Pain      HPI on 09/18/2015 by Dr. Gean Birchwood Logan Day is a 82 y.o. male who was recently admitted for GI bleed and at that time patient's xarelto was discontinued presents to the ER because of abdominal pain and nausea vomiting. Patient states he had a large bowel movement this morning and later started developing abdominal pain in the lower quadrants. Had multiple episodes of nausea and vomiting. CT abdomen and pelvis shows high-grade bowel obstruction at the ileocecal junction. On-call surgeon Dr. Grandville Silos was consulted and the patient was placed on NG tube and admitted for further management. Patient otherwise denies any chest pain or shortness of breath.  Assessment & Plan   Small bowel obstruction -CT abdomen showed high-grade small bowel obstruction -Gen. surgery consulted and appreciated -Gastroenterology consulted and appreciated, no plans for colonoscopy -NG tube was placed twice and patient pulled it out.  Per surgery, will leave out for now -s/p exploratory laparotomy with lysis of adhesions -Starting on ice chips/sips  Left upper extremity DVT -Noted on ultrasound, left brachial vein -Patient was placed on heparin drip however patient has had GI bleed in the past -Previous hospitalist with vascular surgery, Dr. Donnetta Hutching as well as IR, no recommendations for IVC filter placement  Essential hypertension -Stable continue IV hydralazine as needed  Paroxysmal atrial fibrillation -Currently rate controlled -CHADSVASC 7 -Currently no anticoagulation secondary to GI  bleed  Acute on chronic kidney disease, stage II -Creatinine 1.29 -Continue to monitor BMP  Klebsiella pneumonia UTI -Urine culture mainly pansensitive except to ampicillin -Continue ceftriaxone  Chronic Systolic heart failure -Echocardiogram 11/29/2014: EF 40-45% -Monitor intake and output, daily weights  COPD -Currently stable  Acute encephalopathy/dementia -Worsening probably secondary to SBO and UTI -Wellbutrin, Xanax, Zoloft, trazodone held -Continue IV Ativan as needed -No Haldol secondary to QTC prolongation  Recent GI bleed -Hemoglobin 12 -Continue monitor CBC  Urinary retention -Continue Flomax and Foley catheter  Hypokalemia/hypomagnesemia -Will replace and continue to monitor  Code Status: DO NOT RESUSCITATE  Family Communication: None at bedside  Disposition Plan: Admitted.   Time Spent in minutes   30 minutes  Procedures  NG tube placement Exploratory laparotomy with lysis of adhesions  Consults   General surgery Gastroenterology Vascular surgery/IR via phone by Dr. Tana Coast  DVT Prophylaxis  SCDs  Lab Results  Component Value Date   PLT 289 09/25/2015    Medications  Scheduled Meds: . acetaminophen (TYLENOL) oral liquid 160 mg/5 mL  1,000 mg Oral Q6H  . antiseptic oral rinse  7 mL Mouth Rinse q12n4p  . chlorhexidine  15 mL Mouth Rinse BID  . diatrizoate meglumine-sodium  90 mL Per NG tube Once  . pantoprazole (PROTONIX) IV  40 mg Intravenous Q12H   Continuous Infusions: . 0.9 % NaCl with KCl 40 mEq / L    . lactated ringers Stopped (09/25/15 1043)   PRN Meds:.acetaminophen **OR** acetaminophen, albuterol, hydrALAZINE, LORazepam, morphine injection, nitroGLYCERIN, [DISCONTINUED] ondansetron **OR** ondansetron (ZOFRAN) IV, oxyCODONE  Antibiotics    Anti-infectives    Start     Dose/Rate Route Frequency Ordered Stop   09/18/15 2300  cefTRIAXone (ROCEPHIN) 1 g in dextrose 5 % 50 mL IVPB  Status:  Discontinued     1 g 100 mL/hr over 30  Minutes Intravenous Every 24 hours 09/18/15 2251 09/23/15 1239   09/18/15 2115  cefTRIAXone (ROCEPHIN) 1 g in dextrose 5 % 50 mL IVPB     1 g 100 mL/hr over 30 Minutes Intravenous  Once 09/18/15 2106 09/18/15 2308      Subjective:   Logan Day seen and examined today.  Patient has dementia. Denies any abdominal pain or gas passage.   Objective:   Filed Vitals:   09/24/15 1242 09/24/15 2055 09/25/15 0500 09/25/15 0537  BP: 147/70 157/82  154/86  Pulse: 93 92  95  Temp: 98.8 F (37.1 C) 99.3 F (37.4 C) 98.6 F (37 C)   TempSrc: Oral Oral  Oral  Resp: 20   20  Height:      Weight:    68.765 kg (151 lb 9.6 oz)  SpO2: 92% 94%  94%    Wt Readings from Last 3 Encounters:  09/25/15 68.765 kg (151 lb 9.6 oz)  08/27/15 71.3 kg (157 lb 3 oz)  06/10/15 71.668 kg (158 lb)     Intake/Output Summary (Last 24 hours) at 09/25/15 1146 Last data filed at 09/25/15 1038  Gross per 24 hour  Intake  582.5 ml  Output   1100 ml  Net -517.5 ml    Exam  General: Well developed, well nourished, NAD  HEENT: NCAT, mucous membranes moist.   Cardiovascular: S1 S2 auscultated, RRR, no murmurs  Respiratory: Clear to auscultation bilaterally  Abdomen: Soft, nontender, nondistended, few bowel sounds  Extremities: warm dry without cyanosis clubbing or edema  Neuro: AAOx1, nonfocal  Data Review   Micro Results Recent Results (from the past 240 hour(s))  Urine culture     Status: None   Collection Time: 09/18/15  6:56 PM  Result Value Ref Range Status   Specimen Description URINE, CLEAN CATCH  Final   Special Requests NONE  Final   Culture >=100,000 COLONIES/mL KLEBSIELLA PNEUMONIAE  Final   Report Status 09/21/2015 FINAL  Final   Organism ID, Bacteria KLEBSIELLA PNEUMONIAE  Final      Susceptibility   Klebsiella pneumoniae - MIC*    AMPICILLIN >=32 RESISTANT Resistant     CEFAZOLIN <=4 SENSITIVE Sensitive     CEFTRIAXONE <=1 SENSITIVE Sensitive     CIPROFLOXACIN <=0.25  SENSITIVE Sensitive     GENTAMICIN <=1 SENSITIVE Sensitive     IMIPENEM <=0.25 SENSITIVE Sensitive     NITROFURANTOIN 32 SENSITIVE Sensitive     TRIMETH/SULFA <=20 SENSITIVE Sensitive     AMPICILLIN/SULBACTAM 4 SENSITIVE Sensitive     PIP/TAZO <=4 SENSITIVE Sensitive     * >=100,000 COLONIES/mL KLEBSIELLA PNEUMONIAE  MRSA PCR Screening     Status: None   Collection Time: 09/19/15  1:09 AM  Result Value Ref Range Status   MRSA by PCR NEGATIVE NEGATIVE Final    Comment:        The GeneXpert MRSA Assay (FDA approved for NASAL specimens only), is one  component of a comprehensive MRSA colonization surveillance program. It is not intended to diagnose MRSA infection nor to guide or monitor treatment for MRSA infections.     Radiology Reports Ct Abdomen Pelvis Wo Contrast  09/18/2015  CLINICAL DATA:  82 year old male with left flank pain radiating into the left lower quadrant with associated vomiting EXAM: CT ABDOMEN AND PELVIS WITHOUT CONTRAST TECHNIQUE: Multidetector CT imaging of the abdomen and pelvis was performed following the standard protocol without IV contrast. COMPARISON:  Prior CT scan of the chest including the upper abdomen 11/30/2014 FINDINGS: Lower Chest: Small right-sided pleural effusion and associated right lower lobe atelectasis. Background of emphysema and chronic interstitial prominence. The lungs are hyperinflated. Borderline cardiomegaly. No pericardial effusion. Unremarkable imaged distal thoracic esophagus. Abdomen: Unenhanced CT was performed per clinician order. Lack of IV contrast limits sensitivity and specificity, especially for evaluation of abdominal/pelvic solid viscera. Within these limitations, unremarkable CT appearance of the stomach, duodenum, spleen, adrenal glands and pancreas. Normal hepatic contour morphology. No discrete hepatic lesion. Small stones layer within the gallbladder lumen. No intra or extrahepatic biliary ductal dilatation. Nonobstructing  nephrolithiasis bilaterally. Nonspecific perinephric stranding bilaterally. No evidence of hydronephrosis. Multiple loops of dilated and fluid-filled small bowel throughout the abdomen. The source of obstruction appears to be the ileocecal junction. There is diffuse submucosal edema of the cecum in the region of the ileocecal junction. Evaluations significantly limited in the absence of intravenous contrast material. The appendix is identified and is normal. There is associated mesenteric edema and small volume ascites. Colonic diverticular disease without CT evidence of active inflammation. Pelvis: Surgical changes of prior prostatectomy. The bladder is unremarkable. Musculoskeletal: No acute fracture or aggressive appearing lytic or blastic osseous lesion. Vascular: Limited evaluation in the absence of intravenous contrast. Moderate atherosclerotic vascular calcifications without aneurysm. IMPRESSION: 1. High-grade distal small bowel obstruction. The location of the obstruction appears to be at the ileocecal junction were there is diffuse submucosal edema involving the cecum. Differential considerations include terminal ileitis, typhlitis with secondary obstruction, and potentially an obstructing ileocecal mass. Evaluation is limited in the absence of intravenous contrast. There is associated mesenteric edema and small volume ascites related to this process. 2. Bilateral nonobstructing nephrolithiasis. 3. The appendix is identified and is normal. 4. Colonic diverticular disease without CT evidence of active inflammation. 5. Pulmonary emphysema and chronic interstitial prominence. 6. Small right-sided pleural effusion. 7. Borderline cardiomegaly. 8. Cholelithiasis without evidence of acute cholecystitis. 9. Nonspecific perinephric stranding bilaterally consistent with the clinical history of renal dysfunction. 10. Surgical changes of prior prostatectomy. 11. Atherosclerotic vascular calcifications. Electronically  Signed   By: Jacqulynn Cadet M.D.   On: 09/18/2015 20:50   Dg Chest 2 View  08/26/2015  CLINICAL DATA:  Headache, prostate cancer, pale EXAM: CHEST  2 VIEW COMPARISON:  05/19/2015 FINDINGS: Cardiomediastinal silhouette is stable. No acute infiltrate or pleural effusion. No pulmonary edema. Osteopenia and mild degenerative changes thoracic spine. Hyperinflation again noted. IMPRESSION: No active disease. Hyperinflation again noted. Osteopenia and mild degenerative changes thoracic spine. Electronically Signed   By: Lahoma Crocker M.D.   On: 08/26/2015 14:50   Dg Abd 1 View  08/26/2015  CLINICAL DATA:  82 year old male with constipation. EXAM: ABDOMEN - 1 VIEW COMPARISON:  None. FINDINGS: The bowel gas pattern is unremarkable. There is no evidence of bowel obstruction. A small amount of stool in the colon and rectum noted. Surgical clips in the pelvis are present. IMPRESSION: Unremarkable bowel gas pattern. Electronically Signed   By: Dellis Filbert  Hu M.D.   On: 08/26/2015 18:48   Ct Head Wo Contrast  08/26/2015  CLINICAL DATA:  Altered mental status and headache today. Initial encounter. EXAM: CT HEAD WITHOUT CONTRAST TECHNIQUE: Contiguous axial images were obtained from the base of the skull through the vertex without intravenous contrast. COMPARISON:  Brain MRI 07/16/2011.  Head CT scan 07/15/2011. FINDINGS: The brain is atrophic with extensive chronic microvascular ischemic change. Remote bilateral posterior temporal and parietal infarcts are identified. No evidence of acute infarction, hemorrhage, mass lesion, midline shift or abnormal extra-axial fluid collection is seen. No pneumocephalus or hydrocephalus. The calvarium is intact. Mucous retention cysts or polyps right maxillary sinus noted. Mild ethmoid air cell disease is seen on the right. IMPRESSION: No acute abnormality. Remote bilateral parietotemporal infarcts. Atrophy and chronic microvascular ischemic change. Electronically Signed   By: Inge Rise M.D.   On: 08/26/2015 16:24   Dg Chest Portable 1 View  09/18/2015  CLINICAL DATA:  Generalized abdominal pain. EXAM: PORTABLE CHEST 1 VIEW COMPARISON:  August 26, 2015 FINDINGS: The cardiomediastinal silhouette is stable. A torturous thoracic aorta is again identified. No pneumothorax. No pulmonary nodules, masses, or focal infiltrates. IMPRESSION: No active disease. Electronically Signed   By: Dorise Bullion III M.D   On: 09/18/2015 18:08   Dg Abd 2 Views  09/22/2015  CLINICAL DATA:  Follow-up of small bowel obstruction EXAM: ABDOMEN - 2 VIEW COMPARISON:  Acute abdominal series dated February 16, 2016 FINDINGS: There has been reaccumulation of considerable gas within mildly distended small bowel loops in the mid and upper abdomen. There are numerous air-fluid levels. There small amounts of fluid and gas in the colon. There is some rectal gas. No free extraluminal gas collections are observed. The bones are osteopenic. There surgical clips in the pelvis from previous prostatectomy and lymph node dissection. IMPRESSION: Redevelopment of a mid to distal small bowel obstruction that appears fairly high-grade. There is no evidence of perforation. Electronically Signed   By: [name]  Martinique M.D.   On: 09/22/2015 07:46   Dg Abd 2 Views  09/19/2015  CLINICAL DATA:  Lower abdominal pain, small-bowel obstruction EXAM: ABDOMEN - 2 VIEW COMPARISON:  09/18/2015 FINDINGS: NG tube tip is in the proximal stomach with the side port in the distal esophagus. Nonobstructive bowel gas pattern. No free air organomegaly. No suspicious calcification. Surgical clips in the lower pelvis. IMPRESSION: NG tube tip in the proximal stomach with the side port in the distal esophagus. No evidence of obstruction or free air. Electronically Signed   By: Rolm Baptise M.D.   On: 09/19/2015 11:45   Dg Abd Portable 1v  09/18/2015  CLINICAL DATA:  Abdominal pain for 1 day EXAM: PORTABLE ABDOMEN - 1 VIEW COMPARISON:  08/26/2015 FINDINGS:  Scattered large and small bowel gas is noted. No obstructive changes are noted. Postoperative changes are seen. No free air is noted. No acute bony abnormality is noted. IMPRESSION: No acute abnormality seen. Electronically Signed   By: Inez Catalina M.D.   On: 09/18/2015 18:12    CBC  Recent Labs Lab 09/18/15 1742  09/20/15 1139 09/21/15 0413 09/22/15 0110 09/23/15 0447 09/25/15 0613  WBC 9.4  < > 9.7 7.9 8.0 7.2 9.1  HGB 11.5*  < > 11.2* 10.9* 10.5* 10.7* 12.0*  HCT 36.3*  < > 36.5* 35.4* 33.2* 33.9* 36.8*  PLT 283  < > 284 248 180 247 289  MCV 91.2  < > 92.9 92.9 92.5 93.4 92.5  MCH 28.9  < >  28.5 28.6 29.2 29.5 30.2  MCHC 31.7  < > 30.7 30.8 31.6 31.6 32.6  RDW 19.0*  < > 19.7* 19.9* 19.9* 19.5* 19.3*  LYMPHSABS 0.9  --   --   --   --   --   --   MONOABS 0.4  --   --   --   --   --   --   EOSABS 0.2  --   --   --   --   --   --   BASOSABS 0.0  --   --   --   --   --   --   < > = values in this interval not displayed.  Chemistries   Recent Labs Lab 09/18/15 1742 09/19/15 0547 09/21/15 0413 09/22/15 0110 09/23/15 0447 09/25/15 0613  NA 138 138 144 143 143 142  K 4.2 3.9 3.5 3.9 3.3* 3.3*  CL 102 104 111 110 111 110  CO2 21* 23 24 22 23 23   GLUCOSE 124* 133* 91 97 91 88  BUN 18 17 19 18 13 15   CREATININE 1.74* 1.60* 1.59* 1.34* 1.36* 1.29*  CALCIUM 9.1 8.5* 8.3* 8.4* 8.1* 8.3*  MG  --   --   --   --   --  1.5*  AST 42*  --   --   --   --   --   ALT 28  --   --   --   --   --   ALKPHOS 63  --   --   --   --   --   BILITOT 0.6  --   --   --   --   --    ------------------------------------------------------------------------------------------------------------------ estimated creatinine clearance is 44.4 mL/min (by C-G formula based on Cr of 1.29). ------------------------------------------------------------------------------------------------------------------ No results for input(s): HGBA1C in the last 72  hours. ------------------------------------------------------------------------------------------------------------------ No results for input(s): CHOL, HDL, LDLCALC, TRIG, CHOLHDL, LDLDIRECT in the last 72 hours. ------------------------------------------------------------------------------------------------------------------ No results for input(s): TSH, T4TOTAL, T3FREE, THYROIDAB in the last 72 hours.  Invalid input(s): FREET3 ------------------------------------------------------------------------------------------------------------------ No results for input(s): VITAMINB12, FOLATE, FERRITIN, TIBC, IRON, RETICCTPCT in the last 72 hours.  Coagulation profile No results for input(s): INR, PROTIME in the last 168 hours.  No results for input(s): DDIMER in the last 72 hours.  Cardiac Enzymes  Recent Labs Lab 09/18/15 1742  TROPONINI <0.03   ------------------------------------------------------------------------------------------------------------------ Invalid input(s): POCBNP    Donnika Kucher D.O. on 09/25/2015 at 11:46 AM  Between 7am to 7pm - Pager - [phone]  After 7pm go to [email or web address] - password [email or web address]  And look for the night coverage person covering for me after hours  [facility] Office  [phone]

## 2015-09-25 NOTE — Clinical Social Work Note (Signed)
Clinical Social Work Assessment  Patient Details  Name: Logan Day MRN: [id number] Date of Birth: 1933/03/10  Date of referral:  09/25/15               Reason for consult:  Facility Placement                Permission sought to share information with:  Family Supports Permission granted to share information::  Yes, Verbal Permission Granted  Name::        Agency::     Relationship::     Contact Information:     Housing/Transportation Living arrangements for the past 2 months:  [place] Source of Information:  Adult Children (son [name])) Patient Interpreter Needed:  None Criminal Activity/Legal Involvement Pertinent to Current Situation/Hospitalization:  No - Comment as needed Significant Relationships:  Adult Children (2 boys ) Lives with:  Facility Resident Do you feel safe going back to the place where you live?  Yes Need for family participation in patient care:  Yes (Comment)  Care giving concerns:  Bowl blockage. PT recommend SNF.   Social Worker assessment / plan:  BSW intern had to complete the assessment with patient son [name]). BSW intern explained PT recommendation and referral process. Patient is currently from [place] part but since PT is recommend SNF the patient son is calling to see if his dad can be switched to the skilled level until he is able to return to the [facility] level. Patient son is agreeable to other SNFs. Patient son states that his father has been to [facility] in the past as well. Patient son feels as though this will be best for the patient and his current medical needs at this time.  Employment status:  Disabled (Comment on whether or not currently receiving Disability) Insurance information:    PT Recommendations:  [place] / Referral to community resources:  [place]  Patient/Family's Response to care:  Patient son is agreeable to patient  going to SNF.   Patient/Family's Understanding of and Emotional Response to Diagnosis, Current Treatment, and Prognosis:  Patient son has good insight on patient current condition and treatment with hopes if his dad returning back to the ASL part of [facility] once well enough after skilled.   Emotional Assessment Appearance:  Appears stated age Attitude/Demeanor/Rapport:  Unable to Assess Affect (typically observed):  Unable to Assess Orientation:  Oriented to Self Alcohol / Substance use:  Never Used Psych involvement (Current and /or in the community):  No (Comment)  Discharge Needs  Concerns to be addressed:  No discharge needs identified Readmission within the last 30 days:  No Current discharge risk:  None Barriers to Discharge:  No Barriers Identified   [place] intern  [phone]

## 2015-09-26 LAB — BASIC METABOLIC PANEL
Anion gap: 10 (ref 5–15)
BUN: 17 mg/dL (ref 6–20)
CHLORIDE: 107 mmol/L (ref 101–111)
CO2: 21 mmol/L — AB (ref 22–32)
Calcium: 8.3 mg/dL — ABNORMAL LOW (ref 8.9–10.3)
Creatinine, Ser: 1.26 mg/dL — ABNORMAL HIGH (ref 0.61–1.24)
GFR calc Af Amer: >60 mL/min (ref >60)
GFR calc non Af Amer: 52 mL/min — ABNORMAL LOW (ref >60)
Glucose, Bld: 94 mg/dL (ref 65–99)
POTASSIUM: 3.7 mmol/L (ref 3.5–5.1)
SODIUM: 138 mmol/L (ref 135–145)

## 2015-09-26 LAB — GLUCOSE, CAPILLARY
GLUCOSE-CAPILLARY: 89 mg/dL (ref 65–99)
GLUCOSE-CAPILLARY: 131 mg/dL — AB (ref 65–99)
Glucose-Capillary: 108 mg/dL — ABNORMAL HIGH (ref 65–99)
Glucose-Capillary: 92 mg/dL (ref 65–99)
Glucose-Capillary: 99 mg/dL (ref 65–99)

## 2015-09-26 LAB — MAGNESIUM: Magnesium: 1.8 mg/dL (ref 1.7–2.4)

## 2015-09-26 LAB — CBC
HEMATOCRIT: 37.5 % — AB (ref 39.0–52.0)
Hemoglobin: 12.2 g/dL — ABNORMAL LOW (ref 13.0–17.0)
MCH: 29.8 pg (ref 26.0–34.0)
MCHC: 32.5 g/dL (ref 30.0–36.0)
MCV: 91.7 fL (ref 78.0–100.0)
Platelets: 306 10*3/uL (ref 150–400)
RBC: 4.09 MIL/uL — AB (ref 4.22–5.81)
RDW: 19.2 % — AB (ref 11.5–15.5)
WBC: 9 10*3/uL (ref 4.0–10.5)

## 2015-09-26 NOTE — Progress Notes (Signed)
3 Days Post-Op  Subjective: Reports flatus, no BM  Objective: Vital signs in last 24 hours: Temp:  [98 F (36.7 C)-99 F (37.2 C)] 99 F (37.2 C) (02/24 0427) Pulse Rate:  [88-95] 88 (02/24 0427) Resp:  [16-20] 18 (02/24 0427) BP: (142-157)/(74-96) 157/96 mmHg (02/24 0427) SpO2:  [95 %-96 %] 95 % (02/24 0427) Weight:  [70.217 kg (154 lb 12.8 oz)] 70.217 kg (154 lb 12.8 oz) (02/24 0427) Last BM Date:  (pt does not know)  Intake/Output from previous day: 02/23 0701 - 02/24 0700 In: 2267.1 [P.O.:680; I.V.:1537.1; IV Piggyback:50] Out: 425 [Urine:425] Intake/Output this shift:    General appearance: cooperative GI: soft, +BS, incision CDI  Lab Results:   Recent Labs  09/25/15 0613 09/26/15 0430  WBC 9.1 9.0  HGB 12.0* 12.2*  HCT 36.8* 37.5*  PLT 289 306   BMET  Recent Labs  09/25/15 0613 09/26/15 0430  NA 142 138  K 3.3* 3.7  CL 110 107  CO2 23 21*  GLUCOSE 88 94  BUN 15 17  CREATININE 1.29* 1.26*  CALCIUM 8.3* 8.3*   PT/INR No results for input(s): LABPROT, INR in the last 72 hours. ABG No results for input(s): PHART, HCO3 in the last 72 hours.  Invalid input(s): PCO2, PO2  Studies/Results: No results found.  Anti-infectives: Anti-infectives    Start     Dose/Rate Route Frequency Ordered Stop   09/18/15 2300  cefTRIAXone (ROCEPHIN) 1 g in dextrose 5 % 50 mL IVPB  Status:  Discontinued     1 g 100 mL/hr over 30 Minutes Intravenous Every 24 hours 09/18/15 2251 09/23/15 1239   09/18/15 2115  cefTRIAXone (ROCEPHIN) 1 g in dextrose 5 % 50 mL IVPB     1 g 100 mL/hr over 30 Minutes Intravenous  Once 09/18/15 2106 09/18/15 2308      Assessment/Plan: s/p Procedure(s): EXPLORATORY LAPAROTOMY (N/A) LYSIS OF ADHESION  (N/A) POD#3 Allow sips clears, await bowel function  LOS: 8 days    Logan Day E 09/26/2015

## 2015-09-26 NOTE — Progress Notes (Signed)
[facility]                                                                              Patient Demographics  Logan Day, is a 82 y.o. male, DOB - Feb 10, 1933, [id number]  Admit date - 09/18/2015   Admitting Physician Rise Patience, MD  Outpatient Primary MD for the patient is No primary care provider on file.  LOS - 8   Chief Complaint  Patient presents with  . Abdominal Pain      HPI on 09/18/2015 by Dr. Gean Birchwood Logan Day is a 82 y.o. male who was recently admitted for GI bleed and at that time patient's xarelto was discontinued presents to the ER because of abdominal pain and nausea vomiting. Patient states he had a large bowel movement this morning and later started developing abdominal pain in the lower quadrants. Had multiple episodes of nausea and vomiting. CT abdomen and pelvis shows high-grade bowel obstruction at the ileocecal junction. On-call surgeon Dr. Grandville Silos was consulted and the patient was placed on NG tube and admitted for further management. Patient otherwise denies any chest pain or shortness of breath.  Assessment & Plan   Small bowel obstruction -CT abdomen showed high-grade small bowel obstruction -Gen. surgery consulted and appreciated -Gastroenterology consulted and appreciated, no plans for colonoscopy -NG tube was placed twice and patient pulled it out.  Per surgery, will leave out for now -s/p exploratory laparotomy with lysis of adhesions -Continue on ice chips/sips  Left upper extremity DVT -Noted on ultrasound, left brachial vein -Patient was placed on heparin drip however patient has had GI bleed in the past -Previous hospitalist with vascular surgery, Dr. Donnetta Hutching as well as IR, no recommendations for IVC filter placement  Essential hypertension -Stable continue IV hydralazine as needed  Paroxysmal atrial fibrillation -Currently rate controlled -CHADSVASC 7 -Currently no anticoagulation secondary to GI  bleed  Acute on chronic kidney disease, stage II -Creatinine 1.26 -Continue to monitor BMP  Klebsiella pneumonia UTI -Urine culture mainly pansensitive except to ampicillin -Continue ceftriaxone  Chronic Systolic heart failure -Echocardiogram 11/29/2014: EF 40-45% -Monitor intake and output, daily weights  COPD -Currently stable  Acute encephalopathy/dementia -Worsening probably secondary to SBO and UTI -Wellbutrin, Xanax, Zoloft, trazodone held -Continue IV Ativan as needed -No Haldol secondary to QTC prolongation  Recent GI bleed -Hemoglobin 12.2 -Continue monitor CBC  Urinary retention -Continue Flomax and Foley catheter  Hypokalemia/hypomagnesemia -Continue to monitor and replace as needed  Code Status: DO NOT RESUSCITATE  Family Communication: None at bedside  Disposition Plan: Admitted.   Time Spent in minutes   30 minutes  Procedures  NG tube placement Exploratory laparotomy with lysis of adhesions  Consults   General surgery Gastroenterology Vascular surgery/IR via phone by Dr. Tana Coast  DVT Prophylaxis  SCDs  Lab Results  Component Value Date   PLT 306 09/26/2015    Medications  Scheduled Meds: . acetaminophen (TYLENOL) oral liquid 160 mg/5 mL  1,000 mg Oral Q6H  . antiseptic oral rinse  7 mL Mouth Rinse q12n4p  . chlorhexidine  15 mL Mouth Rinse BID  . diatrizoate meglumine-sodium  90 mL Per NG tube Once  . pantoprazole (PROTONIX)  IV  40 mg Intravenous Q12H   Continuous Infusions: . 0.9 % NaCl with KCl 40 mEq / L 75 mL/hr (09/26/15 0211)  . lactated ringers Stopped (09/25/15 1043)   PRN Meds:.acetaminophen **OR** acetaminophen, albuterol, hydrALAZINE, LORazepam, morphine injection, nitroGLYCERIN, [DISCONTINUED] ondansetron **OR** ondansetron (ZOFRAN) IV, oxyCODONE  Antibiotics    Anti-infectives    Start     Dose/Rate Route Frequency Ordered Stop   09/18/15 2300  cefTRIAXone (ROCEPHIN) 1 g in dextrose 5 % 50 mL IVPB  Status:   Discontinued     1 g 100 mL/hr over 30 Minutes Intravenous Every 24 hours 09/18/15 2251 09/23/15 1239   09/18/15 2115  cefTRIAXone (ROCEPHIN) 1 g in dextrose 5 % 50 mL IVPB     1 g 100 mL/hr over 30 Minutes Intravenous  Once 09/18/15 2106 09/18/15 2308      Subjective:   Logan Day seen and examined today.  Patient has dementia. Denies any abdominal pain or gas passage. Patient did not know he had surgery.  Objective:   Filed Vitals:   09/25/15 0537 09/25/15 1357 09/25/15 2005 09/26/15 0427  BP: 154/86 142/74 147/88 157/96  Pulse: 95 95 90 88  Temp:  98 F (36.7 C) 98.3 F (36.8 C) 99 F (37.2 C)  TempSrc: Oral Oral Oral Oral  Resp: 20 20 16 18   Height:      Weight: 68.765 kg (151 lb 9.6 oz)   70.217 kg (154 lb 12.8 oz)  SpO2: 94% 96% 96% 95%    Wt Readings from Last 3 Encounters:  09/26/15 70.217 kg (154 lb 12.8 oz)  08/27/15 71.3 kg (157 lb 3 oz)  06/10/15 71.668 kg (158 lb)     Intake/Output Summary (Last 24 hours) at 09/26/15 1049 Last data filed at 09/26/15 1002  Gross per 24 hour  Intake 2107.08 ml  Output    600 ml  Net 1507.08 ml    Exam  General: Well developed, well nourished, NAD  HEENT: NCAT, mucous membranes moist.   Cardiovascular: S1 S2 auscultated, RRR, no murmurs  Respiratory: Clear to auscultation bilaterally  Abdomen: Soft, nontender, nondistended, few bowel sounds, dressing clean.  Extremities: warm dry without cyanosis clubbing or edema  Neuro: AAOx1, nonfocal  Data Review   Micro Results Recent Results (from the past 240 hour(s))  Urine culture     Status: None   Collection Time: 09/18/15  6:56 PM  Result Value Ref Range Status   Specimen Description URINE, CLEAN CATCH  Final   Special Requests NONE  Final   Culture >=100,000 COLONIES/mL KLEBSIELLA PNEUMONIAE  Final   Report Status 09/21/2015 FINAL  Final   Organism ID, Bacteria KLEBSIELLA PNEUMONIAE  Final      Susceptibility   Klebsiella pneumoniae - MIC*     AMPICILLIN >=32 RESISTANT Resistant     CEFAZOLIN <=4 SENSITIVE Sensitive     CEFTRIAXONE <=1 SENSITIVE Sensitive     CIPROFLOXACIN <=0.25 SENSITIVE Sensitive     GENTAMICIN <=1 SENSITIVE Sensitive     IMIPENEM <=0.25 SENSITIVE Sensitive     NITROFURANTOIN 32 SENSITIVE Sensitive     TRIMETH/SULFA <=20 SENSITIVE Sensitive     AMPICILLIN/SULBACTAM 4 SENSITIVE Sensitive     PIP/TAZO <=4 SENSITIVE Sensitive     * >=100,000 COLONIES/mL KLEBSIELLA PNEUMONIAE  MRSA PCR Screening     Status: None   Collection Time: 09/19/15  1:09 AM  Result Value Ref Range Status   MRSA by PCR NEGATIVE NEGATIVE Final    Comment:  The GeneXpert MRSA Assay (FDA approved for NASAL specimens only), is one component of a comprehensive MRSA colonization surveillance program. It is not intended to diagnose MRSA infection nor to guide or monitor treatment for MRSA infections.     Radiology Reports Ct Abdomen Pelvis Wo Contrast  09/18/2015  CLINICAL DATA:  82 year old male with left flank pain radiating into the left lower quadrant with associated vomiting EXAM: CT ABDOMEN AND PELVIS WITHOUT CONTRAST TECHNIQUE: Multidetector CT imaging of the abdomen and pelvis was performed following the standard protocol without IV contrast. COMPARISON:  Prior CT scan of the chest including the upper abdomen 11/30/2014 FINDINGS: Lower Chest: Small right-sided pleural effusion and associated right lower lobe atelectasis. Background of emphysema and chronic interstitial prominence. The lungs are hyperinflated. Borderline cardiomegaly. No pericardial effusion. Unremarkable imaged distal thoracic esophagus. Abdomen: Unenhanced CT was performed per clinician order. Lack of IV contrast limits sensitivity and specificity, especially for evaluation of abdominal/pelvic solid viscera. Within these limitations, unremarkable CT appearance of the stomach, duodenum, spleen, adrenal glands and pancreas. Normal hepatic contour morphology. No  discrete hepatic lesion. Small stones layer within the gallbladder lumen. No intra or extrahepatic biliary ductal dilatation. Nonobstructing nephrolithiasis bilaterally. Nonspecific perinephric stranding bilaterally. No evidence of hydronephrosis. Multiple loops of dilated and fluid-filled small bowel throughout the abdomen. The source of obstruction appears to be the ileocecal junction. There is diffuse submucosal edema of the cecum in the region of the ileocecal junction. Evaluations significantly limited in the absence of intravenous contrast material. The appendix is identified and is normal. There is associated mesenteric edema and small volume ascites. Colonic diverticular disease without CT evidence of active inflammation. Pelvis: Surgical changes of prior prostatectomy. The bladder is unremarkable. Musculoskeletal: No acute fracture or aggressive appearing lytic or blastic osseous lesion. Vascular: Limited evaluation in the absence of intravenous contrast. Moderate atherosclerotic vascular calcifications without aneurysm. IMPRESSION: 1. High-grade distal small bowel obstruction. The location of the obstruction appears to be at the ileocecal junction were there is diffuse submucosal edema involving the cecum. Differential considerations include terminal ileitis, typhlitis with secondary obstruction, and potentially an obstructing ileocecal mass. Evaluation is limited in the absence of intravenous contrast. There is associated mesenteric edema and small volume ascites related to this process. 2. Bilateral nonobstructing nephrolithiasis. 3. The appendix is identified and is normal. 4. Colonic diverticular disease without CT evidence of active inflammation. 5. Pulmonary emphysema and chronic interstitial prominence. 6. Small right-sided pleural effusion. 7. Borderline cardiomegaly. 8. Cholelithiasis without evidence of acute cholecystitis. 9. Nonspecific perinephric stranding bilaterally consistent with the  clinical history of renal dysfunction. 10. Surgical changes of prior prostatectomy. 11. Atherosclerotic vascular calcifications. Electronically Signed   By: Jacqulynn Cadet M.D.   On: 09/18/2015 20:50   Dg Chest Portable 1 View  09/18/2015  CLINICAL DATA:  Generalized abdominal pain. EXAM: PORTABLE CHEST 1 VIEW COMPARISON:  August 26, 2015 FINDINGS: The cardiomediastinal silhouette is stable. A torturous thoracic aorta is again identified. No pneumothorax. No pulmonary nodules, masses, or focal infiltrates. IMPRESSION: No active disease. Electronically Signed   By: Dorise Bullion III M.D   On: 09/18/2015 18:08   Dg Abd 2 Views  09/22/2015  CLINICAL DATA:  Follow-up of small bowel obstruction EXAM: ABDOMEN - 2 VIEW COMPARISON:  Acute abdominal series dated February 16, 2016 FINDINGS: There has been reaccumulation of considerable gas within mildly distended small bowel loops in the mid and upper abdomen. There are numerous air-fluid levels. There small amounts of fluid and gas in the  colon. There is some rectal gas. No free extraluminal gas collections are observed. The bones are osteopenic. There surgical clips in the pelvis from previous prostatectomy and lymph node dissection. IMPRESSION: Redevelopment of a mid to distal small bowel obstruction that appears fairly high-grade. There is no evidence of perforation. Electronically Signed   By: [name]  Martinique M.D.   On: 09/22/2015 07:46   Dg Abd 2 Views  09/19/2015  CLINICAL DATA:  Lower abdominal pain, small-bowel obstruction EXAM: ABDOMEN - 2 VIEW COMPARISON:  09/18/2015 FINDINGS: NG tube tip is in the proximal stomach with the side port in the distal esophagus. Nonobstructive bowel gas pattern. No free air organomegaly. No suspicious calcification. Surgical clips in the lower pelvis. IMPRESSION: NG tube tip in the proximal stomach with the side port in the distal esophagus. No evidence of obstruction or free air. Electronically Signed   By: Rolm Baptise M.D.    On: 09/19/2015 11:45   Dg Abd Portable 1v  09/18/2015  CLINICAL DATA:  Abdominal pain for 1 day EXAM: PORTABLE ABDOMEN - 1 VIEW COMPARISON:  08/26/2015 FINDINGS: Scattered large and small bowel gas is noted. No obstructive changes are noted. Postoperative changes are seen. No free air is noted. No acute bony abnormality is noted. IMPRESSION: No acute abnormality seen. Electronically Signed   By: Inez Catalina M.D.   On: 09/18/2015 18:12    CBC  Recent Labs Lab 09/21/15 0413 09/22/15 0110 09/23/15 0447 09/25/15 0613 09/26/15 0430  WBC 7.9 8.0 7.2 9.1 9.0  HGB 10.9* 10.5* 10.7* 12.0* 12.2*  HCT 35.4* 33.2* 33.9* 36.8* 37.5*  PLT 248 180 247 289 306  MCV 92.9 92.5 93.4 92.5 91.7  MCH 28.6 29.2 29.5 30.2 29.8  MCHC 30.8 31.6 31.6 32.6 32.5  RDW 19.9* 19.9* 19.5* 19.3* 19.2*    Chemistries   Recent Labs Lab 09/21/15 0413 09/22/15 0110 09/23/15 0447 09/25/15 0613 09/26/15 0430  NA 144 143 143 142 138  K 3.5 3.9 3.3* 3.3* 3.7  CL 111 110 111 110 107  CO2 24 22 23 23  21*  GLUCOSE 91 97 91 88 94  BUN 19 18 13 15 17   CREATININE 1.59* 1.34* 1.36* 1.29* 1.26*  CALCIUM 8.3* 8.4* 8.1* 8.3* 8.3*  MG  --   --   --  1.5* 1.8   ------------------------------------------------------------------------------------------------------------------ estimated creatinine clearance is 46.4 mL/min (by C-G formula based on Cr of 1.26). ------------------------------------------------------------------------------------------------------------------ No results for input(s): HGBA1C in the last 72 hours. ------------------------------------------------------------------------------------------------------------------ No results for input(s): CHOL, HDL, LDLCALC, TRIG, CHOLHDL, LDLDIRECT in the last 72 hours. ------------------------------------------------------------------------------------------------------------------ No results for input(s): TSH, T4TOTAL, T3FREE, THYROIDAB in the last 72  hours.  Invalid input(s): FREET3 ------------------------------------------------------------------------------------------------------------------ No results for input(s): VITAMINB12, FOLATE, FERRITIN, TIBC, IRON, RETICCTPCT in the last 72 hours.  Coagulation profile No results for input(s): INR, PROTIME in the last 168 hours.  No results for input(s): DDIMER in the last 72 hours.  Cardiac Enzymes No results for input(s): CKMB, TROPONINI, MYOGLOBIN in the last 168 hours.  Invalid input(s): CK ------------------------------------------------------------------------------------------------------------------ Invalid input(s): POCBNP    Abhijay Morriss D.O. on 09/26/2015 at 10:49 AM  Between 7am to 7pm - Pager - [phone]  After 7pm go to [email or web address] - password [email or web address]  And look for the night coverage person covering for me after hours  [facility] Office  [phone]

## 2015-09-27 DIAGNOSIS — D500 Iron deficiency anemia secondary to blood loss (chronic): Secondary | ICD-10-CM

## 2015-09-27 LAB — GLUCOSE, CAPILLARY
GLUCOSE-CAPILLARY: 91 mg/dL (ref 65–99)
GLUCOSE-CAPILLARY: 115 mg/dL — AB (ref 65–99)
Glucose-Capillary: 92 mg/dL (ref 65–99)

## 2015-09-27 LAB — CBC
HCT: 38.7 % — ABNORMAL LOW (ref 39.0–52.0)
Hemoglobin: 12.1 g/dL — ABNORMAL LOW (ref 13.0–17.0)
MCH: 28.5 pg (ref 26.0–34.0)
MCHC: 31.3 g/dL (ref 30.0–36.0)
MCV: 91.1 fL (ref 78.0–100.0)
PLATELETS: 318 10*3/uL (ref 150–400)
RBC: 4.25 MIL/uL (ref 4.22–5.81)
RDW: 19.4 % — AB (ref 11.5–15.5)
WBC: 8.6 10*3/uL (ref 4.0–10.5)

## 2015-09-27 LAB — BASIC METABOLIC PANEL
Anion gap: 7 (ref 5–15)
BUN: 17 mg/dL (ref 6–20)
CALCIUM: 8.1 mg/dL — AB (ref 8.9–10.3)
CO2: 21 mmol/L — AB (ref 22–32)
CREATININE: 1.18 mg/dL (ref 0.61–1.24)
Chloride: 112 mmol/L — ABNORMAL HIGH (ref 101–111)
GFR calc Af Amer: >60 mL/min (ref >60)
GFR calc non Af Amer: 56 mL/min — ABNORMAL LOW (ref >60)
Glucose, Bld: 100 mg/dL — ABNORMAL HIGH (ref 65–99)
POTASSIUM: 4.4 mmol/L (ref 3.5–5.1)
Sodium: 140 mmol/L (ref 135–145)

## 2015-09-27 LAB — MAGNESIUM: Magnesium: 1.7 mg/dL (ref 1.7–2.4)

## 2015-09-27 NOTE — Progress Notes (Signed)
Patient ID: Logan Day, male   DOB: [birth year], 82 y.o.   MRN: [id number] 4 Days Post-Op  Subjective: Continues to have flatus.  Tolerated clear liq.  Objective: Vital signs in last 24 hours: Temp:  [97.9 F (36.6 C)-98.3 F (36.8 C)] 97.9 F (36.6 C) (02/25 0452) Pulse Rate:  [83-90] 83 (02/25 0452) Resp:  [16] 16 (02/25 0452) BP: (140-152)/(80-83) 152/83 mmHg (02/25 0452) SpO2:  [95 %-98 %] 95 % (02/25 0452) Weight:  [70.22 kg (154 lb 12.9 oz)] 70.22 kg (154 lb 12.9 oz) (02/25 0452) Last BM Date:  (pt does not know)  Intake/Output from previous day: 02/24 0701 - 02/25 0700 In: 2340 [P.O.:540; I.V.:1800] Out: 375 [Urine:375] Intake/Output this shift:    General appearance: cooperative GI: soft, +BS, incision CDI  Lab Results:   Recent Labs  09/26/15 0430 09/27/15 0304  WBC 9.0 8.6  HGB 12.2* 12.1*  HCT 37.5* 38.7*  PLT 306 318   BMET  Recent Labs  09/26/15 0430 09/27/15 0304  NA 138 140  K 3.7 4.4  CL 107 112*  CO2 21* 21*  GLUCOSE 94 100*  BUN 17 17  CREATININE 1.26* 1.18  CALCIUM 8.3* 8.1*   PT/INR No results for input(s): LABPROT, INR in the last 72 hours. ABG No results for input(s): PHART, HCO3 in the last 72 hours.  Invalid input(s): PCO2, PO2  Studies/Results: No results found.  Anti-infectives: Anti-infectives    Start     Dose/Rate Route Frequency Ordered Stop   09/18/15 2300  cefTRIAXone (ROCEPHIN) 1 g in dextrose 5 % 50 mL IVPB  Status:  Discontinued     1 g 100 mL/hr over 30 Minutes Intravenous Every 24 hours 09/18/15 2251 09/23/15 1239   09/18/15 2115  cefTRIAXone (ROCEPHIN) 1 g in dextrose 5 % 50 mL IVPB     1 g 100 mL/hr over 30 Minutes Intravenous  Once 09/18/15 2106 09/18/15 2308      Assessment/Plan: s/p Procedure(s): EXPLORATORY LAPAROTOMY (N/A) LYSIS OF ADHESION  (N/A) POD#4 Advance to fulls.     LOS: 9 days    Logan Day 09/27/2015

## 2015-09-27 NOTE — Progress Notes (Signed)
[facility]                                                                              Patient Demographics  Logan Day, is a 82 y.o. male, DOB - May 01, 1933, VX:[id number]  Admit date - 09/18/2015   Admitting Physician Rise Patience, MD  Outpatient Primary MD for the patient is No primary care provider on file.  LOS - 9   Chief Complaint  Patient presents with  . Abdominal Pain      HPI on 09/18/2015 by Dr. Gean Birchwood Logan Day is a 82 y.o. male who was recently admitted for GI bleed and at that time patient's xarelto was discontinued presents to the ER because of abdominal pain and nausea vomiting. Patient states he had a large bowel movement this morning and later started developing abdominal pain in the lower quadrants. Had multiple episodes of nausea and vomiting. CT abdomen and pelvis shows high-grade bowel obstruction at the ileocecal junction. On-call surgeon Dr. Grandville Silos was consulted and the patient was placed on NG tube and admitted for further management. Patient otherwise denies any chest pain or shortness of breath.  Assessment & Plan   Small bowel obstruction -CT abdomen showed high-grade small bowel obstruction -Gen. surgery consulted and appreciated -Gastroenterology consulted and appreciated, no plans for colonoscopy -NG tube was placed twice and patient pulled it out.  Per surgery, will leave out for now -s/p exploratory laparotomy with lysis of adhesions -was placed on clear liquid diet and tolerated well- surgery advancing to full liquids today  Left upper extremity DVT -Noted on ultrasound, left brachial vein -Patient was placed on heparin drip however patient has had GI bleed in the past -Previous hospitalist with vascular surgery, Dr. Donnetta Hutching as well as IR, no recommendations for IVC filter placement  Essential hypertension -Stable continue IV hydralazine as needed  Paroxysmal atrial fibrillation -Currently rate  controlled -CHADSVASC 7 -Currently no anticoagulation secondary to GI bleed  Acute on chronic kidney disease, stage II -Creatinine 1.18 -Continue to monitor BMP  Klebsiella pneumonia UTI -Urine culture mainly pansensitive except to ampicillin -Continue ceftriaxone  Chronic Systolic heart failure -Echocardiogram 11/29/2014: EF 40-45% -Monitor intake and output, daily weights  COPD -Currently stable  Acute encephalopathy/dementia -Appears to have resolved, patient alert and oriented today -Worsening probably secondary to SBO and UTI -Wellbutrin, Xanax, Zoloft, trazodone held -Continue IV Ativan as needed -No Haldol secondary to QTC prolongation  Recent GI bleed -Hemoglobin 12.1 -Continue monitor CBC  Urinary retention -Continue Flomax and Foley catheter  Hypokalemia/hypomagnesemia -Continue to monitor and replace as needed  Code Status: DO NOT RESUSCITATE  Family Communication: None at bedside  Disposition Plan: Admitted. Will need SNF.  Time Spent in minutes   30 minutes  Procedures  NG tube placement Exploratory laparotomy with lysis of adhesions  Consults   General surgery Gastroenterology Vascular surgery/IR via phone by Dr. Tana Coast  DVT Prophylaxis  SCDs  Lab Results  Component Value Date   PLT 318 09/27/2015    Medications  Scheduled Meds: . acetaminophen (TYLENOL) oral liquid 160 mg/5 mL  1,000 mg Oral Q6H  . antiseptic oral rinse  7 mL Mouth Rinse q12n4p  .  chlorhexidine  15 mL Mouth Rinse BID  . diatrizoate meglumine-sodium  90 mL Per NG tube Once  . pantoprazole (PROTONIX) IV  40 mg Intravenous Q12H   Continuous Infusions: . 0.9 % NaCl with KCl 40 mEq / L 75 mL/hr (09/27/15 0319)  . lactated ringers Stopped (09/25/15 1043)   PRN Meds:.acetaminophen **OR** acetaminophen, albuterol, hydrALAZINE, LORazepam, morphine injection, nitroGLYCERIN, [DISCONTINUED] ondansetron **OR** ondansetron (ZOFRAN) IV, oxyCODONE  Antibiotics    Anti-infectives     Start     Dose/Rate Route Frequency Ordered Stop   09/18/15 2300  cefTRIAXone (ROCEPHIN) 1 g in dextrose 5 % 50 mL IVPB  Status:  Discontinued     1 g 100 mL/hr over 30 Minutes Intravenous Every 24 hours 09/18/15 2251 09/23/15 1239   09/18/15 2115  cefTRIAXone (ROCEPHIN) 1 g in dextrose 5 % 50 mL IVPB     1 g 100 mL/hr over 30 Minutes Intravenous  Once 09/18/15 2106 09/18/15 2308      Subjective:   Aavion Jelks seen and examined today.  Patient has dementia. Feel he has been passing "lots of gas."  Denies chest pain, shortness of breath, nausea or vomiting.  Feels abdominal pain is improving.   Objective:   Filed Vitals:   09/26/15 0427 09/26/15 1421 09/26/15 2204 09/27/15 0452  BP: 157/96 140/81 152/80 152/83  Pulse: 88 90 88 83  Temp: 99 F (37.2 C) 98.3 F (36.8 C) 98 F (36.7 C) 97.9 F (36.6 C)  TempSrc: Oral Oral Oral Oral  Resp: 18 16 16 16   Height:      Weight: 70.217 kg (154 lb 12.8 oz)   70.22 kg (154 lb 12.9 oz)  SpO2: 95% 95% 98% 95%    Wt Readings from Last 3 Encounters:  09/27/15 70.22 kg (154 lb 12.9 oz)  08/27/15 71.3 kg (157 lb 3 oz)  06/10/15 71.668 kg (158 lb)     Intake/Output Summary (Last 24 hours) at 09/27/15 1128 Last data filed at 09/27/15 1000  Gross per 24 hour  Intake   2200 ml  Output    550 ml  Net   1650 ml    Exam  General: Well developed, well nourished, NAD  HEENT: NCAT, mucous membranes moist.   Cardiovascular: S1 S2 auscultated, RRR, no murmurs  Respiratory: Clear to auscultation bilaterally  Abdomen: Soft, nontender, nondistended, +bowel sounds, incision clean, +staples  Extremities: warm dry without cyanosis clubbing or edema  Neuro: AAOx3, nonfocal  Psych: pleasant  Data Review   Micro Results Recent Results (from the past 240 hour(s))  Urine culture     Status: None   Collection Time: 09/18/15  6:56 PM  Result Value Ref Range Status   Specimen Description URINE, CLEAN CATCH  Final   Special Requests  NONE  Final   Culture >=100,000 COLONIES/mL KLEBSIELLA PNEUMONIAE  Final   Report Status 09/21/2015 FINAL  Final   Organism ID, Bacteria KLEBSIELLA PNEUMONIAE  Final      Susceptibility   Klebsiella pneumoniae - MIC*    AMPICILLIN >=32 RESISTANT Resistant     CEFAZOLIN <=4 SENSITIVE Sensitive     CEFTRIAXONE <=1 SENSITIVE Sensitive     CIPROFLOXACIN <=0.25 SENSITIVE Sensitive     GENTAMICIN <=1 SENSITIVE Sensitive     IMIPENEM <=0.25 SENSITIVE Sensitive     NITROFURANTOIN 32 SENSITIVE Sensitive     TRIMETH/SULFA <=20 SENSITIVE Sensitive     AMPICILLIN/SULBACTAM 4 SENSITIVE Sensitive     PIP/TAZO <=4 SENSITIVE Sensitive     * >=  100,000 COLONIES/mL KLEBSIELLA PNEUMONIAE  MRSA PCR Screening     Status: None   Collection Time: 09/19/15  1:09 AM  Result Value Ref Range Status   MRSA by PCR NEGATIVE NEGATIVE Final    Comment:        The GeneXpert MRSA Assay (FDA approved for NASAL specimens only), is one component of a comprehensive MRSA colonization surveillance program. It is not intended to diagnose MRSA infection nor to guide or monitor treatment for MRSA infections.     Radiology Reports Ct Abdomen Pelvis Wo Contrast  09/18/2015  CLINICAL DATA:  82 year old male with left flank pain radiating into the left lower quadrant with associated vomiting EXAM: CT ABDOMEN AND PELVIS WITHOUT CONTRAST TECHNIQUE: Multidetector CT imaging of the abdomen and pelvis was performed following the standard protocol without IV contrast. COMPARISON:  Prior CT scan of the chest including the upper abdomen 11/30/2014 FINDINGS: Lower Chest: Small right-sided pleural effusion and associated right lower lobe atelectasis. Background of emphysema and chronic interstitial prominence. The lungs are hyperinflated. Borderline cardiomegaly. No pericardial effusion. Unremarkable imaged distal thoracic esophagus. Abdomen: Unenhanced CT was performed per clinician order. Lack of IV contrast limits sensitivity and  specificity, especially for evaluation of abdominal/pelvic solid viscera. Within these limitations, unremarkable CT appearance of the stomach, duodenum, spleen, adrenal glands and pancreas. Normal hepatic contour morphology. No discrete hepatic lesion. Small stones layer within the gallbladder lumen. No intra or extrahepatic biliary ductal dilatation. Nonobstructing nephrolithiasis bilaterally. Nonspecific perinephric stranding bilaterally. No evidence of hydronephrosis. Multiple loops of dilated and fluid-filled small bowel throughout the abdomen. The source of obstruction appears to be the ileocecal junction. There is diffuse submucosal edema of the cecum in the region of the ileocecal junction. Evaluations significantly limited in the absence of intravenous contrast material. The appendix is identified and is normal. There is associated mesenteric edema and small volume ascites. Colonic diverticular disease without CT evidence of active inflammation. Pelvis: Surgical changes of prior prostatectomy. The bladder is unremarkable. Musculoskeletal: No acute fracture or aggressive appearing lytic or blastic osseous lesion. Vascular: Limited evaluation in the absence of intravenous contrast. Moderate atherosclerotic vascular calcifications without aneurysm. IMPRESSION: 1. High-grade distal small bowel obstruction. The location of the obstruction appears to be at the ileocecal junction were there is diffuse submucosal edema involving the cecum. Differential considerations include terminal ileitis, typhlitis with secondary obstruction, and potentially an obstructing ileocecal mass. Evaluation is limited in the absence of intravenous contrast. There is associated mesenteric edema and small volume ascites related to this process. 2. Bilateral nonobstructing nephrolithiasis. 3. The appendix is identified and is normal. 4. Colonic diverticular disease without CT evidence of active inflammation. 5. Pulmonary emphysema and  chronic interstitial prominence. 6. Small right-sided pleural effusion. 7. Borderline cardiomegaly. 8. Cholelithiasis without evidence of acute cholecystitis. 9. Nonspecific perinephric stranding bilaterally consistent with the clinical history of renal dysfunction. 10. Surgical changes of prior prostatectomy. 11. Atherosclerotic vascular calcifications. Electronically Signed   By: Jacqulynn Cadet M.D.   On: 09/18/2015 20:50   Dg Chest Portable 1 View  09/18/2015  CLINICAL DATA:  Generalized abdominal pain. EXAM: PORTABLE CHEST 1 VIEW COMPARISON:  August 26, 2015 FINDINGS: The cardiomediastinal silhouette is stable. A torturous thoracic aorta is again identified. No pneumothorax. No pulmonary nodules, masses, or focal infiltrates. IMPRESSION: No active disease. Electronically Signed   By: Dorise Bullion III M.D   On: 09/18/2015 18:08   Dg Abd 2 Views  09/22/2015  CLINICAL DATA:  Follow-up of small bowel obstruction EXAM:  ABDOMEN - 2 VIEW COMPARISON:  Acute abdominal series dated February 16, 2016 FINDINGS: There has been reaccumulation of considerable gas within mildly distended small bowel loops in the mid and upper abdomen. There are numerous air-fluid levels. There small amounts of fluid and gas in the colon. There is some rectal gas. No free extraluminal gas collections are observed. The bones are osteopenic. There surgical clips in the pelvis from previous prostatectomy and lymph node dissection. IMPRESSION: Redevelopment of a mid to distal small bowel obstruction that appears fairly high-grade. There is no evidence of perforation. Electronically Signed   By: [name]  Martinique M.D.   On: 09/22/2015 07:46   Dg Abd 2 Views  09/19/2015  CLINICAL DATA:  Lower abdominal pain, small-bowel obstruction EXAM: ABDOMEN - 2 VIEW COMPARISON:  09/18/2015 FINDINGS: NG tube tip is in the proximal stomach with the side port in the distal esophagus. Nonobstructive bowel gas pattern. No free air organomegaly. No suspicious  calcification. Surgical clips in the lower pelvis. IMPRESSION: NG tube tip in the proximal stomach with the side port in the distal esophagus. No evidence of obstruction or free air. Electronically Signed   By: Rolm Baptise M.D.   On: 09/19/2015 11:45   Dg Abd Portable 1v  09/18/2015  CLINICAL DATA:  Abdominal pain for 1 day EXAM: PORTABLE ABDOMEN - 1 VIEW COMPARISON:  08/26/2015 FINDINGS: Scattered large and small bowel gas is noted. No obstructive changes are noted. Postoperative changes are seen. No free air is noted. No acute bony abnormality is noted. IMPRESSION: No acute abnormality seen. Electronically Signed   By: Inez Catalina M.D.   On: 09/18/2015 18:12    CBC  Recent Labs Lab 09/22/15 0110 09/23/15 0447 09/25/15 0613 09/26/15 0430 09/27/15 0304  WBC 8.0 7.2 9.1 9.0 8.6  HGB 10.5* 10.7* 12.0* 12.2* 12.1*  HCT 33.2* 33.9* 36.8* 37.5* 38.7*  PLT 180 247 289 306 318  MCV 92.5 93.4 92.5 91.7 91.1  MCH 29.2 29.5 30.2 29.8 28.5  MCHC 31.6 31.6 32.6 32.5 31.3  RDW 19.9* 19.5* 19.3* 19.2* 19.4*    Chemistries   Recent Labs Lab 09/22/15 0110 09/23/15 0447 09/25/15 0613 09/26/15 0430 09/27/15 0304  NA 143 143 142 138 140  K 3.9 3.3* 3.3* 3.7 4.4  CL 110 111 110 107 112*  CO2 22 23 23  21* 21*  GLUCOSE 97 91 88 94 100*  BUN 18 13 15 17 17   CREATININE 1.34* 1.36* 1.29* 1.26* 1.18  CALCIUM 8.4* 8.1* 8.3* 8.3* 8.1*  MG  --   --  1.5* 1.8 1.7   ------------------------------------------------------------------------------------------------------------------ estimated creatinine clearance is 49.6 mL/min (by C-G formula based on Cr of 1.18). ------------------------------------------------------------------------------------------------------------------ No results for input(s): HGBA1C in the last 72 hours. ------------------------------------------------------------------------------------------------------------------ No results for input(s): CHOL, HDL, LDLCALC, TRIG,  CHOLHDL, LDLDIRECT in the last 72 hours. ------------------------------------------------------------------------------------------------------------------ No results for input(s): TSH, T4TOTAL, T3FREE, THYROIDAB in the last 72 hours.  Invalid input(s): FREET3 ------------------------------------------------------------------------------------------------------------------ No results for input(s): VITAMINB12, FOLATE, FERRITIN, TIBC, IRON, RETICCTPCT in the last 72 hours.  Coagulation profile No results for input(s): INR, PROTIME in the last 168 hours.  No results for input(s): DDIMER in the last 72 hours.  Cardiac Enzymes No results for input(s): CKMB, TROPONINI, MYOGLOBIN in the last 168 hours.  Invalid input(s): CK ------------------------------------------------------------------------------------------------------------------ Invalid input(s): POCBNP    Coston Mandato D.O. on 09/27/2015 at 11:28 AM  Between 7am to 7pm - Pager - [phone]  After 7pm go to [email or web address] - password [email or web address]  And  look for the night coverage person covering for me after hours  [facility] Office  [phone]

## 2015-09-28 LAB — GLUCOSE, CAPILLARY
GLUCOSE-CAPILLARY: 98 mg/dL (ref 65–99)
Glucose-Capillary: 100 mg/dL — ABNORMAL HIGH (ref 65–99)
Glucose-Capillary: 107 mg/dL — ABNORMAL HIGH (ref 65–99)
Glucose-Capillary: 99 mg/dL (ref 65–99)

## 2015-09-28 LAB — BASIC METABOLIC PANEL
Anion gap: 7 (ref 5–15)
BUN: 16 mg/dL (ref 6–20)
CHLORIDE: 112 mmol/L — AB (ref 101–111)
CO2: 19 mmol/L — ABNORMAL LOW (ref 22–32)
CREATININE: 1.34 mg/dL — AB (ref 0.61–1.24)
Calcium: 8.3 mg/dL — ABNORMAL LOW (ref 8.9–10.3)
GFR calc Af Amer: 56 mL/min — ABNORMAL LOW (ref >60)
GFR calc non Af Amer: 48 mL/min — ABNORMAL LOW (ref >60)
GLUCOSE: 99 mg/dL (ref 65–99)
POTASSIUM: 4.5 mmol/L (ref 3.5–5.1)
Sodium: 138 mmol/L (ref 135–145)

## 2015-09-28 LAB — CBC
HCT: 39.7 % (ref 39.0–52.0)
HEMOGLOBIN: 12.4 g/dL — AB (ref 13.0–17.0)
MCH: 28.4 pg (ref 26.0–34.0)
MCHC: 31.2 g/dL (ref 30.0–36.0)
MCV: 91.1 fL (ref 78.0–100.0)
Platelets: 314 10*3/uL (ref 150–400)
RBC: 4.36 MIL/uL (ref 4.22–5.81)
RDW: 19.1 % — ABNORMAL HIGH (ref 11.5–15.5)
WBC: 7.7 10*3/uL (ref 4.0–10.5)

## 2015-09-28 MED ORDER — NAPHAZOLINE-GLYCERIN 0.012-0.2 % OP SOLN
1.0000 [drp] | Freq: Four times a day (QID) | OPHTHALMIC | Status: DC | PRN
  Filled 2015-09-28: qty 15

## 2015-09-28 NOTE — NC FL2 (Addendum)
[place] [facility] [place] LEVEL OF CARE SCREENING TOOL     IDENTIFICATION  Patient Name: Logan Day Birthdate: [birth year] Sex: male Admission Date (Current Location): 09/18/2015  [facility] and [place] Number:  Herbalist and Address:  [facility], [place] [address], [place], [place] [address]      Provider Number: [id number]  Attending Physician Name and Address:  Cristal Ford, DO  Relative Name and Phone Number:   Catherine Sama [phone]    Current Level of Care: Hospital Recommended Level of Care: [place] Prior Approval Number:    Date Approved/Denied:   PASRR Number:    Discharge Plan:  (ALF)    Current Diagnoses: Patient Active Problem List   Diagnosis Date Noted  . Small bowel obstruction ([place]) 09/18/2015  . UTI (lower urinary tract infection) 09/18/2015  . SBO (small bowel obstruction) ([place]) 09/18/2015  . Protein-calorie malnutrition, severe 08/27/2015  . GI bleed 08/26/2015  . Symptomatic anemia 08/26/2015  . CKD (chronic kidney disease), stage II 08/26/2015  . Physical deconditioning 08/26/2015  . Protein calorie malnutrition ([place]) 08/26/2015  . Anticoagulated   . Blood loss anemia   . Constipation   . Bleeding gastrointestinal   . Dementia with behavioral disturbance 05/19/2015  . Severe recurrent major depression without psychotic features ([place]) 05/19/2015  . Incontinence of urine 04/23/2015  . Medicare annual wellness visit, subsequent 04/22/2015  . Anxiety and depression 01/22/2015  . On amiodarone therapy 12/11/2014  . Chronic combined systolic and diastolic congestive heart failure ([place])   . PAF (paroxysmal atrial fibrillation) ([place])   . Malnutrition of moderate degree ([place]) 11/30/2014  . CKD (chronic kidney disease) stage 2, GFR 60-89 ml/min 11/29/2014  . Hyperlipidemia 11/29/2014  . 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 Weight loss, unintentional 11/29/2014  . Hard  of hearing 11/29/2014  . Essential hypertension 12/18/2013  . Other and unspecified angina pectoris 10/19/2012    Class: Acute  . Atherosclerotic heart disease of native coronary artery with angina pectoris ([place]) 10/19/2012  . Aphasia S/P CVA 07/15/2011    Orientation RESPIRATION BLADDER Height & Weight     Self  Normal Continent Weight: 159 lb 12.8 oz (72.485 kg) (scale c) Height:  5\' 11"  (180.3 cm)  BEHAVIORAL SYMPTOMS/MOOD NEUROLOGICAL BOWEL NUTRITION STATUS     (    AAOx1, nonfocal) Continent  (soft diet)  AMBULATORY STATUS COMMUNICATION OF NEEDS Skin   Limited Assist Verbally Normal                       Personal Care Assistance Level of Assistance  Bathing, Feeding, Dressing Bathing Assistance: Limited assistance Feeding assistance: Limited assistance Dressing Assistance: Limited assistance     Functional Limitations Info  Sight, Hearing, Speech Sight Info: Adequate Hearing Info: Adequate Speech Info: Adequate    SPECIAL CARE FACTORS FREQUENCY  PT (By licensed PT)     PT Frequency: Min 3X/week              Contractures Contractures Info: Not present    Additional Factors Info  Code Status Code Status Info: DNR Allergies Info:  (NKDA)           Discharge Medications: TAKE these medications       acetaminophen 500 MG tablet  Commonly known as: TYLENOL  Take 500 mg by mouth 2 (two) times daily.     albuterol 108 (90 Base) MCG/ACT inhaler  Commonly known as: PROAIR HFA  Inhale 1-2 puffs into the lungs every 6 (six) hours as needed for wheezing or shortness of breath.     ALPRAZolam 0.5 MG tablet  Commonly known as: XANAX  Take 1 tablet (0.5 mg total) by mouth 2 (two) times daily as needed for anxiety.     amiodarone 200 MG tablet  Commonly known as: PACERONE  Take 1 tablet (200 mg total) by mouth daily.     aspirin EC 81 MG tablet  Take 1 tablet (81 mg total) by mouth daily. Resume on 09/11/15      bisacodyl 5 MG EC tablet  Commonly known as: DULCOLAX  Take 5 mg by mouth daily as needed for moderate constipation.     buPROPion 150 MG 12 hr tablet  Commonly known as: WELLBUTRIN SR  Take 150 mg by mouth 2 (two) times daily.     feeding supplement Liqd  Take 1 Container by mouth 3 (three) times daily between meals.     ferrous sulfate 325 (65 FE) MG tablet  Take 1 tablet (325 mg total) by mouth 2 (two) times daily with a meal.     NITROSTAT 0.4 MG SL tablet  Generic drug: nitroGLYCERIN  Place 0.4 mg under the tongue every 5 (five) minutes as needed for chest pain.     oxyCODONE 5 MG/5ML solution  Commonly known as: ROXICODONE  Take 5 mLs (5 mg total) by mouth every 4 (four) hours as needed (you are going to have to ask him if he is having pain.).     pantoprazole 40 MG tablet  Commonly known as: PROTONIX  Take 1 tablet (40 mg total) by mouth daily at 12 noon.     pravastatin 40 MG tablet  Commonly known as: PRAVACHOL  Take 40 mg by mouth every evening.     sertraline 100 MG tablet  Commonly known as: ZOLOFT  Take 100 mg by mouth every evening.     traZODone 50 MG tablet  Commonly known as: DESYREL  Take 50 mg by mouth at bedtime.           Relevant Imaging Results:  Relevant Lab Results:   Additional Information  ([id number])  DRAKE, Miachel Roux, LCSW

## 2015-09-28 NOTE — Progress Notes (Signed)
[facility]                                                                              Patient Demographics  Logan Day, is a 82 y.o. male, DOB - October 30, 1932, [id number]  Admit date - 09/18/2015   Admitting Physician Rise Patience, MD  Outpatient Primary MD for the patient is No primary care provider on file.  LOS - 10   Chief Complaint  Patient presents with  . Abdominal Pain      HPI on 09/18/2015 by Dr. Gean Birchwood Logan Day is a 82 y.o. male who was recently admitted for GI bleed and at that time patient's xarelto was discontinued presents to the ER because of abdominal pain and nausea vomiting. Patient states he had a large bowel movement this morning and later started developing abdominal pain in the lower quadrants. Had multiple episodes of nausea and vomiting. CT abdomen and pelvis shows high-grade bowel obstruction at the ileocecal junction. On-call surgeon Dr. Grandville Silos was consulted and the patient was placed on NG tube and admitted for further management. Patient otherwise denies any chest pain or shortness of breath.  Assessment & Plan   Small bowel obstruction -CT abdomen showed high-grade small bowel obstruction -Gen. surgery consulted and appreciated -Gastroenterology consulted and appreciated, no plans for colonoscopy -NG tube was placed twice and patient pulled it out.  Per surgery, will leave out for now -s/p exploratory laparotomy with lysis of adhesions -Transitioned to soft diet today  Left upper extremity DVT -Noted on ultrasound, left brachial vein -Patient was placed on heparin drip however patient has had GI bleed in the past -Previous hospitalist with vascular surgery, Dr. Donnetta Hutching as well as IR, no recommendations for IVC filter placement  Essential hypertension -Stable continue IV hydralazine as needed  Paroxysmal atrial fibrillation -Currently rate controlled -CHADSVASC 7 -Currently no anticoagulation secondary to  GI bleed  Acute on chronic kidney disease, stage II -Creatinine 1.34 -Continue to monitor BMP -Encourage PO intake  Klebsiella pneumonia UTI -Urine culture mainly pansensitive except to ampicillin -Completed antibiotic course with ceftriaxone   Chronic Systolic heart failure -Echocardiogram 11/29/2014: EF 40-45% -Monitor intake and output, daily weights  COPD -Currently stable  Acute encephalopathy/dementia -Appears to have resolved, patient alert and oriented today -Worsening probably secondary to SBO and UTI -Wellbutrin, Xanax, Zoloft, trazodone held -Continue IV Ativan as needed -No Haldol secondary to QTC prolongation  Recent GI bleed -Hemoglobin 12.4 -Continue monitor CBC  Urinary retention -Continue Flomax and Foley catheter  Hypokalemia/hypomagnesemia -Continue to monitor and replace as needed  Code Status: DO NOT RESUSCITATE  Family Communication: None at bedside  Disposition Plan: Admitted. Will need SNF, likely discharge on 09/29/15  Time Spent in minutes   30 minutes  Procedures  NG tube placement Exploratory laparotomy with lysis of adhesions  Consults   General surgery Gastroenterology Vascular surgery/IR via phone by Dr. Tana Coast  DVT Prophylaxis  SCDs  Lab Results  Component Value Date   PLT 314 09/28/2015    Medications  Scheduled Meds: . acetaminophen (TYLENOL) oral liquid 160 mg/5 mL  1,000 mg Oral Q6H  . antiseptic oral rinse  7 mL Mouth Rinse q12n4p  .  chlorhexidine  15 mL Mouth Rinse BID  . diatrizoate meglumine-sodium  90 mL Per NG tube Once  . pantoprazole (PROTONIX) IV  40 mg Intravenous Q12H   Continuous Infusions:   PRN Meds:.acetaminophen **OR** acetaminophen, albuterol, hydrALAZINE, LORazepam, morphine injection, naphazoline-glycerin, nitroGLYCERIN, [DISCONTINUED] ondansetron **OR** ondansetron (ZOFRAN) IV, oxyCODONE  Antibiotics    Anti-infectives    Start     Dose/Rate Route Frequency Ordered Stop   09/18/15 2300   cefTRIAXone (ROCEPHIN) 1 g in dextrose 5 % 50 mL IVPB  Status:  Discontinued     1 g 100 mL/hr over 30 Minutes Intravenous Every 24 hours 09/18/15 2251 09/23/15 1239   09/18/15 2115  cefTRIAXone (ROCEPHIN) 1 g in dextrose 5 % 50 mL IVPB     1 g 100 mL/hr over 30 Minutes Intravenous  Once 09/18/15 2106 09/18/15 2308      Subjective:   Logan Day seen and examined today.  Patient has dementia. Had a bowel movement and continues to have flatus.  Denies chest pain, shortness of breath, nausea or vomiting.    Objective:   Filed Vitals:   09/27/15 1230 09/27/15 1631 09/27/15 2040 09/28/15 0519  BP: 137/97 129/87 154/87 126/81  Pulse: 78 81 80 80  Temp: 98.1 F (36.7 C) 98.5 F (36.9 C) 98.3 F (36.8 C) 98.3 F (36.8 C)  TempSrc: Oral Oral Oral Oral  Resp: 16 17 18 18   Height:      Weight:    72.485 kg (159 lb 12.8 oz)  SpO2: 98% 96% 98% 96%    Wt Readings from Last 3 Encounters:  09/28/15 72.485 kg (159 lb 12.8 oz)  08/27/15 71.3 kg (157 lb 3 oz)  06/10/15 71.668 kg (158 lb)     Intake/Output Summary (Last 24 hours) at 09/28/15 1042 Last data filed at 09/28/15 [id number]  Gross per 24 hour  Intake    580 ml  Output    576 ml  Net      4 ml    Exam (no change from previous days)  General: Well developed, well nourished, NAD  HEENT: NCAT, mucous membranes moist.   Cardiovascular: S1 S2 auscultated, RRR, no murmurs  Respiratory: Clear to auscultation bilaterally  Abdomen: Soft, nontender, nondistended, +bowel sounds, incision clean, +staples  Extremities: warm dry without cyanosis clubbing or edema  Neuro: AAOx3, nonfocal  Psych: pleasant  Data Review   Micro Results Recent Results (from the past 240 hour(s))  Urine culture     Status: None   Collection Time: 09/18/15  6:56 PM  Result Value Ref Range Status   Specimen Description URINE, CLEAN CATCH  Final   Special Requests NONE  Final   Culture >=100,000 COLONIES/mL KLEBSIELLA PNEUMONIAE  Final   Report  Status 09/21/2015 FINAL  Final   Organism ID, Bacteria KLEBSIELLA PNEUMONIAE  Final      Susceptibility   Klebsiella pneumoniae - MIC*    AMPICILLIN >=32 RESISTANT Resistant     CEFAZOLIN <=4 SENSITIVE Sensitive     CEFTRIAXONE <=1 SENSITIVE Sensitive     CIPROFLOXACIN <=0.25 SENSITIVE Sensitive     GENTAMICIN <=1 SENSITIVE Sensitive     IMIPENEM <=0.25 SENSITIVE Sensitive     NITROFURANTOIN 32 SENSITIVE Sensitive     TRIMETH/SULFA <=20 SENSITIVE Sensitive     AMPICILLIN/SULBACTAM 4 SENSITIVE Sensitive     PIP/TAZO <=4 SENSITIVE Sensitive     * >=100,000 COLONIES/mL KLEBSIELLA PNEUMONIAE  MRSA PCR Screening     Status: None   Collection Time: 09/19/15  1:09 AM  Result Value Ref Range Status   MRSA by PCR NEGATIVE NEGATIVE Final    Comment:        The GeneXpert MRSA Assay (FDA approved for NASAL specimens only), is one component of a comprehensive MRSA colonization surveillance program. It is not intended to diagnose MRSA infection nor to guide or monitor treatment for MRSA infections.     Radiology Reports Ct Abdomen Pelvis Wo Contrast  09/18/2015  CLINICAL DATA:  82 year old male with left flank pain radiating into the left lower quadrant with associated vomiting EXAM: CT ABDOMEN AND PELVIS WITHOUT CONTRAST TECHNIQUE: Multidetector CT imaging of the abdomen and pelvis was performed following the standard protocol without IV contrast. COMPARISON:  Prior CT scan of the chest including the upper abdomen 11/30/2014 FINDINGS: Lower Chest: Small right-sided pleural effusion and associated right lower lobe atelectasis. Background of emphysema and chronic interstitial prominence. The lungs are hyperinflated. Borderline cardiomegaly. No pericardial effusion. Unremarkable imaged distal thoracic esophagus. Abdomen: Unenhanced CT was performed per clinician order. Lack of IV contrast limits sensitivity and specificity, especially for evaluation of abdominal/pelvic solid viscera. Within these  limitations, unremarkable CT appearance of the stomach, duodenum, spleen, adrenal glands and pancreas. Normal hepatic contour morphology. No discrete hepatic lesion. Small stones layer within the gallbladder lumen. No intra or extrahepatic biliary ductal dilatation. Nonobstructing nephrolithiasis bilaterally. Nonspecific perinephric stranding bilaterally. No evidence of hydronephrosis. Multiple loops of dilated and fluid-filled small bowel throughout the abdomen. The source of obstruction appears to be the ileocecal junction. There is diffuse submucosal edema of the cecum in the region of the ileocecal junction. Evaluations significantly limited in the absence of intravenous contrast material. The appendix is identified and is normal. There is associated mesenteric edema and small volume ascites. Colonic diverticular disease without CT evidence of active inflammation. Pelvis: Surgical changes of prior prostatectomy. The bladder is unremarkable. Musculoskeletal: No acute fracture or aggressive appearing lytic or blastic osseous lesion. Vascular: Limited evaluation in the absence of intravenous contrast. Moderate atherosclerotic vascular calcifications without aneurysm. IMPRESSION: 1. High-grade distal small bowel obstruction. The location of the obstruction appears to be at the ileocecal junction were there is diffuse submucosal edema involving the cecum. Differential considerations include terminal ileitis, typhlitis with secondary obstruction, and potentially an obstructing ileocecal mass. Evaluation is limited in the absence of intravenous contrast. There is associated mesenteric edema and small volume ascites related to this process. 2. Bilateral nonobstructing nephrolithiasis. 3. The appendix is identified and is normal. 4. Colonic diverticular disease without CT evidence of active inflammation. 5. Pulmonary emphysema and chronic interstitial prominence. 6. Small right-sided pleural effusion. 7. Borderline  cardiomegaly. 8. Cholelithiasis without evidence of acute cholecystitis. 9. Nonspecific perinephric stranding bilaterally consistent with the clinical history of renal dysfunction. 10. Surgical changes of prior prostatectomy. 11. Atherosclerotic vascular calcifications. Electronically Signed   By: Jacqulynn Cadet M.D.   On: 09/18/2015 20:50   Dg Chest Portable 1 View  09/18/2015  CLINICAL DATA:  Generalized abdominal pain. EXAM: PORTABLE CHEST 1 VIEW COMPARISON:  August 26, 2015 FINDINGS: The cardiomediastinal silhouette is stable. A torturous thoracic aorta is again identified. No pneumothorax. No pulmonary nodules, masses, or focal infiltrates. IMPRESSION: No active disease. Electronically Signed   By: Dorise Bullion III M.D   On: 09/18/2015 18:08   Dg Abd 2 Views  09/22/2015  CLINICAL DATA:  Follow-up of small bowel obstruction EXAM: ABDOMEN - 2 VIEW COMPARISON:  Acute abdominal series dated February 16, 2016 FINDINGS: There has been reaccumulation of considerable  gas within mildly distended small bowel loops in the mid and upper abdomen. There are numerous air-fluid levels. There small amounts of fluid and gas in the colon. There is some rectal gas. No free extraluminal gas collections are observed. The bones are osteopenic. There surgical clips in the pelvis from previous prostatectomy and lymph node dissection. IMPRESSION: Redevelopment of a mid to distal small bowel obstruction that appears fairly high-grade. There is no evidence of perforation. Electronically Signed   By: [name]  Martinique M.D.   On: 09/22/2015 07:46   Dg Abd 2 Views  09/19/2015  CLINICAL DATA:  Lower abdominal pain, small-bowel obstruction EXAM: ABDOMEN - 2 VIEW COMPARISON:  09/18/2015 FINDINGS: NG tube tip is in the proximal stomach with the side port in the distal esophagus. Nonobstructive bowel gas pattern. No free air organomegaly. No suspicious calcification. Surgical clips in the lower pelvis. IMPRESSION: NG tube tip in the  proximal stomach with the side port in the distal esophagus. No evidence of obstruction or free air. Electronically Signed   By: Rolm Baptise M.D.   On: 09/19/2015 11:45   Dg Abd Portable 1v  09/18/2015  CLINICAL DATA:  Abdominal pain for 1 day EXAM: PORTABLE ABDOMEN - 1 VIEW COMPARISON:  08/26/2015 FINDINGS: Scattered large and small bowel gas is noted. No obstructive changes are noted. Postoperative changes are seen. No free air is noted. No acute bony abnormality is noted. IMPRESSION: No acute abnormality seen. Electronically Signed   By: Inez Catalina M.D.   On: 09/18/2015 18:12    CBC  Recent Labs Lab 09/23/15 0447 09/25/15 0613 09/26/15 0430 09/27/15 0304 09/28/15 0435  WBC 7.2 9.1 9.0 8.6 7.7  HGB 10.7* 12.0* 12.2* 12.1* 12.4*  HCT 33.9* 36.8* 37.5* 38.7* 39.7  PLT 247 289 306 318 314  MCV 93.4 92.5 91.7 91.1 91.1  MCH 29.5 30.2 29.8 28.5 28.4  MCHC 31.6 32.6 32.5 31.3 31.2  RDW 19.5* 19.3* 19.2* 19.4* 19.1*    Chemistries   Recent Labs Lab 09/23/15 0447 09/25/15 0613 09/26/15 0430 09/27/15 0304 09/28/15 0435  NA 143 142 138 140 138  K 3.3* 3.3* 3.7 4.4 4.5  CL 111 110 107 112* 112*  CO2 23 23 21* 21* 19*  GLUCOSE 91 88 94 100* 99  BUN 13 15 17 17 16   CREATININE 1.36* 1.29* 1.26* 1.18 1.34*  CALCIUM 8.1* 8.3* 8.3* 8.1* 8.3*  MG  --  1.5* 1.8 1.7  --    ------------------------------------------------------------------------------------------------------------------ estimated creatinine clearance is 45.1 mL/min (by C-G formula based on Cr of 1.34). ------------------------------------------------------------------------------------------------------------------ No results for input(s): HGBA1C in the last 72 hours. ------------------------------------------------------------------------------------------------------------------ No results for input(s): CHOL, HDL, LDLCALC, TRIG, CHOLHDL, LDLDIRECT in the last 72  hours. ------------------------------------------------------------------------------------------------------------------ No results for input(s): TSH, T4TOTAL, T3FREE, THYROIDAB in the last 72 hours.  Invalid input(s): FREET3 ------------------------------------------------------------------------------------------------------------------ No results for input(s): VITAMINB12, FOLATE, FERRITIN, TIBC, IRON, RETICCTPCT in the last 72 hours.  Coagulation profile No results for input(s): INR, PROTIME in the last 168 hours.  No results for input(s): DDIMER in the last 72 hours.  Cardiac Enzymes No results for input(s): CKMB, TROPONINI, MYOGLOBIN in the last 168 hours.  Invalid input(s): CK ------------------------------------------------------------------------------------------------------------------ Invalid input(s): POCBNP    Cy Bresee D.O. on 09/28/2015 at 10:42 AM  Between 7am to 7pm - Pager - [phone]  After 7pm go to [email or web address] - password [email or web address]  And look for the night coverage person covering for me after hours  [facility] Office  [phone]

## 2015-09-28 NOTE — Progress Notes (Signed)
Patient ID: Logan Day, male   DOB: [birth year], 82 y.o.   MRN: [id number] 5 Days Post-Op  Subjective: Continues to have flatus.  Had good BM yesterday.  Tolerating full liquids.    Objective: Vital signs in last 24 hours: Temp:  [98.1 F (36.7 C)-98.5 F (36.9 C)] 98.3 F (36.8 C) (02/26 0519) Pulse Rate:  [78-81] 80 (02/26 0519) Resp:  [16-18] 18 (02/26 0519) BP: (126-154)/(81-97) 126/81 mmHg (02/26 0519) SpO2:  [96 %-98 %] 96 % (02/26 0519) Weight:  [72.485 kg (159 lb 12.8 oz)] 72.485 kg (159 lb 12.8 oz) (02/26 0519) Last BM Date:  (pt had a small bowel obstruction)  Intake/Output from previous day: 02/25 0701 - 02/26 0700 In: 620 [P.O.:620] Out: 1076 [Urine:1075; Stool:1] Intake/Output this shift:    General appearance: cooperative GI: soft, +BS, incision CDI  Lab Results:   Recent Labs  09/27/15 0304 09/28/15 0435  WBC 8.6 7.7  HGB 12.1* 12.4*  HCT 38.7* 39.7  PLT 318 314   BMET  Recent Labs  09/27/15 0304 09/28/15 0435  NA 140 138  K 4.4 4.5  CL 112* 112*  CO2 21* 19*  GLUCOSE 100* 99  BUN 17 16  CREATININE 1.18 1.34*  CALCIUM 8.1* 8.3*   PT/INR No results for input(s): LABPROT, INR in the last 72 hours. ABG No results for input(s): PHART, HCO3 in the last 72 hours.  Invalid input(s): PCO2, PO2  Studies/Results: No results found.  Anti-infectives: Anti-infectives    Start     Dose/Rate Route Frequency Ordered Stop   09/18/15 2300  cefTRIAXone (ROCEPHIN) 1 g in dextrose 5 % 50 mL IVPB  Status:  Discontinued     1 g 100 mL/hr over 30 Minutes Intravenous Every 24 hours 09/18/15 2251 09/23/15 1239   09/18/15 2115  cefTRIAXone (ROCEPHIN) 1 g in dextrose 5 % 50 mL IVPB     1 g 100 mL/hr over 30 Minutes Intravenous  Once 09/18/15 2106 09/18/15 2308      Assessment/Plan: s/p Procedure(s): EXPLORATORY LAPAROTOMY (N/A) LYSIS OF ADHESION  (N/A) POD#5 Advance to soft diet. Activity/PT for deconditioning.   Doing well.     LOS: 10 days     Binta Statzer 09/28/2015

## 2015-09-29 LAB — GLUCOSE, CAPILLARY
GLUCOSE-CAPILLARY: 92 mg/dL (ref 65–99)
Glucose-Capillary: 86 mg/dL (ref 65–99)

## 2015-09-29 LAB — BASIC METABOLIC PANEL
ANION GAP: 11 (ref 5–15)
BUN: 15 mg/dL (ref 6–20)
CHLORIDE: 106 mmol/L (ref 101–111)
CO2: 21 mmol/L — AB (ref 22–32)
Calcium: 8.4 mg/dL — ABNORMAL LOW (ref 8.9–10.3)
Creatinine, Ser: 1.42 mg/dL — ABNORMAL HIGH (ref 0.61–1.24)
GFR calc non Af Amer: 45 mL/min — ABNORMAL LOW (ref >60)
GFR, EST AFRICAN AMERICAN: 52 mL/min — AB (ref >60)
Glucose, Bld: 108 mg/dL — ABNORMAL HIGH (ref 65–99)
POTASSIUM: 4.3 mmol/L (ref 3.5–5.1)
Sodium: 138 mmol/L (ref 135–145)

## 2015-09-29 MED ORDER — ALPRAZOLAM 0.5 MG PO TABS: 0.5000 mg | ORAL_TABLET | Freq: Two times a day (BID) | ORAL | Status: DC | PRN

## 2015-09-29 MED ORDER — MAGNESIUM SULFATE 2 GM/50ML IV SOLN
2.0000 g | Freq: Once | INTRAVENOUS | Status: AC
  Administered 2015-09-29: 2 g via INTRAVENOUS
  Filled 2015-09-29: qty 50

## 2015-09-29 MED ORDER — SODIUM CHLORIDE 0.9 % IV SOLN
INTRAVENOUS | Status: DC
  Administered 2015-09-29: 07:00:00 via INTRAVENOUS

## 2015-09-29 MED ORDER — OXYCODONE HCL 5 MG/5ML PO SOLN: 5.0000 mg | ORAL | Status: DC | PRN

## 2015-09-29 NOTE — Discharge Summary (Signed)
Physician Discharge Summary  Logan Day [id number] DOB: Mar 09, 1933 DOA: 09/18/2015  PCP: No primary care provider on file.  Admit date: 09/18/2015 Discharge date: 09/29/2015  Time spent: 45 minutes  Recommendations for Outpatient Follow-up:  Patient will be discharged to skilled nursing facility.  Continue therapy as recommended by the facility.  Patient will need to follow up with primary care provider within one week of discharge, repeat CBC, BMP, Magnesium.  Follow up with Dr. Grandville Day in 2 weeks.  Patient should continue medications as prescribed.  Patient should follow a soft diet.   Discharge Diagnoses:  Small bowel obstruction Left upper extremity DVT Essential hypertension Paroxysmal atrial fibrillation Acute on chronic kidney disease, stage III Clicks or pneumonia UTI Chronic systolic heart failure COPD Acute encephalopathy/dementia Recent GI bleed Hypokalemia/hypomagnesemia  Discharge Condition: Stable  Diet recommendation: Soft  Filed Weights   09/27/15 0452 09/28/15 0519 09/29/15 0404  Weight: 70.22 kg (154 lb 12.9 oz) 72.485 kg (159 lb 12.8 oz) 71.2 kg (156 lb 15.5 oz)    History of present illness:  on 09/18/2015 by Dr. Gean Day Logan Day is a 82 y.o. male who was recently admitted for GI bleed and at that time patient's xarelto was discontinued presents to the ER because of abdominal pain and nausea vomiting. Patient states he had a large bowel movement this morning and later started developing abdominal pain in the lower quadrants. Had multiple episodes of nausea and vomiting. CT abdomen and pelvis shows high-grade bowel obstruction at the ileocecal junction. On-call surgeon Dr. Grandville Day was consulted and the patient was placed on NG tube and admitted for further management. Patient otherwise denies any chest pain or shortness of breath.  Hospital Course:  Small bowel obstruction -CT abdomen showed high-grade small bowel  obstruction -Gen. surgery consulted and appreciated -Gastroenterology consulted and appreciated, no plans for colonoscopy -s/p exploratory laparotomy with lysis of adhesions -Transitioned to soft diet and tolerated well -Patient will need to follow up with [facility] within 1-2 weeks  Left upper extremity DVT -Noted on ultrasound, left brachial vein -Patient was placed on heparin drip however patient has had GI bleed in the past -Previous hospitalist with vascular surgery, Dr. Donnetta Day as well as IR, no recommendations for IVC filter placement  Essential hypertension -Stable continue IV hydralazine as needed  Paroxysmal atrial fibrillation -Currently rate controlled -CHADSVASC 7 -Currently no anticoagulation secondary to GI bleed -Patient may continue amiodarone upon discharge  Acute on chronic kidney disease, stage III -Creatinine 1.4 (baseline 1.5-1.7 in 2016) -Continue to monitor BMP -Encourage PO intake  Klebsiella pneumonia UTI -Urine culture mainly pansensitive except to ampicillin -Completed antibiotic course with ceftriaxone   Chronic Systolic heart failure -Echocardiogram 11/29/2014: EF 40-45% -Monitor intake and output, daily weights  COPD -Currently stable  Acute encephalopathy/dementia -Appears to have resolved, patient alert and oriented now -probably secondary to SBO and UTI -Wellbutrin, Xanax, Zoloft, trazodone held  Recent GI bleed -Hemoglobin 12.4, stable  Urinary retention -Continue Flomax and Foley catheter  Hypokalemia/hypomagnesemia -K 4.3 -Mg 1.7, will replace -Repeat BMP and Magnesium in one week  Code Status: DO NOT RESUSCITATE  Procedures  NG tube placement Exploratory laparotomy with lysis of adhesions  Consults  General surgery Gastroenterology Vascular surgery/IR via phone by Dr. Tana Day  Discharge Exam: Filed Vitals:   09/28/15 2134 09/29/15 0404  BP: 134/73 132/74  Pulse: 89 80  Temp: 97.9 F (36.6 C) 98.4 F (36.9 C)  Resp: 18  18    Exam   General:  Well developed, well nourished, NAD  HEENT: NCAT, mucous membranes moist.   Cardiovascular: S1 S2 auscultated, RRR, no murmurs  Respiratory: Clear to auscultation bilaterally  Abdomen: Soft, nontender, nondistended, +bowel sounds, incision clean, +staples  Extremities: warm dry without cyanosis clubbing or edema  Neuro: AAOx3, nonfocal  Psych: pleasant  Discharge Instructions      Discharge Instructions    Discharge instructions    Complete by:  As directed   Patient will be discharged to skilled nursing facility.  Continue therapy as recommended by the facility.  Patient will need to follow up with primary care provider within one week of discharge, repeat CBC, BMP, Magnesium.  Follow up with Dr. Grandville Day in 2 weeks.  Patient should continue medications as prescribed.  Patient should follow a soft diet.            Medication List    TAKE these medications        acetaminophen 500 MG tablet  Commonly known as:  TYLENOL  Take 500 mg by mouth 2 (two) times daily.     albuterol 108 (90 Base) MCG/ACT inhaler  Commonly known as:  PROAIR HFA  Inhale 1-2 puffs into the lungs every 6 (six) hours as needed for wheezing or shortness of breath.     ALPRAZolam 0.5 MG tablet  Commonly known as:  XANAX  Take 1 tablet (0.5 mg total) by mouth 2 (two) times daily as needed for anxiety.     amiodarone 200 MG tablet  Commonly known as:  PACERONE  Take 1 tablet (200 mg total) by mouth daily.     aspirin EC 81 MG tablet  Take 1 tablet (81 mg total) by mouth daily. Resume on 09/11/15     bisacodyl 5 MG EC tablet  Commonly known as:  DULCOLAX  Take 5 mg by mouth daily as needed for moderate constipation.     buPROPion 150 MG 12 hr tablet  Commonly known as:  WELLBUTRIN SR  Take 150 mg by mouth 2 (two) times daily.     feeding supplement Liqd  Take 1 Container by mouth 3 (three) times daily between meals.     ferrous sulfate 325 (65 FE) MG tablet  Take  1 tablet (325 mg total) by mouth 2 (two) times daily with a meal.     NITROSTAT 0.4 MG SL tablet  Generic drug:  nitroGLYCERIN  Place 0.4 mg under the tongue every 5 (five) minutes as needed for chest pain.     oxyCODONE 5 MG/5ML solution  Commonly known as:  ROXICODONE  Take 5 mLs (5 mg total) by mouth every 4 (four) hours as needed (you are going to have to ask him if he is having pain.).     pantoprazole 40 MG tablet  Commonly known as:  PROTONIX  Take 1 tablet (40 mg total) by mouth daily at 12 noon.     pravastatin 40 MG tablet  Commonly known as:  PRAVACHOL  Take 40 mg by mouth every evening.     sertraline 100 MG tablet  Commonly known as:  ZOLOFT  Take 100 mg by mouth every evening.     traZODone 50 MG tablet  Commonly known as:  DESYREL  Take 50 mg by mouth at bedtime.       No Known Allergies Follow-up Information    Follow up with Zenovia Jarred, MD On 10/15/2015.   Specialty:  General Surgery   Why:  Your appointment is at 10 AM, be  there 30 minutes early for check in.   Contact information:   [address] [place] [address] [phone]       Follow up with [facility] On 10/03/2015.   Specialty:  General Surgery   Why:  You will get your staples out at 11 AM with the nurse.  Be at the office 20 minutes early for check in.   Contact information:   [place] [address] [phone]        The results of significant diagnostics from this hospitalization (including imaging, microbiology, ancillary and laboratory) are listed below for reference.    Significant Diagnostic Studies: Ct Abdomen Pelvis Wo Contrast  09/18/2015  CLINICAL DATA:  82 year old male with left flank pain radiating into the left lower quadrant with associated vomiting EXAM: CT ABDOMEN AND PELVIS WITHOUT CONTRAST TECHNIQUE: Multidetector CT imaging of the abdomen and pelvis was performed following the standard protocol without IV  contrast. COMPARISON:  Prior CT scan of the chest including the upper abdomen 11/30/2014 FINDINGS: Lower Chest: Small right-sided pleural effusion and associated right lower lobe atelectasis. Background of emphysema and chronic interstitial prominence. The lungs are hyperinflated. Borderline cardiomegaly. No pericardial effusion. Unremarkable imaged distal thoracic esophagus. Abdomen: Unenhanced CT was performed per clinician order. Lack of IV contrast limits sensitivity and specificity, especially for evaluation of abdominal/pelvic solid viscera. Within these limitations, unremarkable CT appearance of the stomach, duodenum, spleen, adrenal glands and pancreas. Normal hepatic contour morphology. No discrete hepatic lesion. Small stones layer within the gallbladder lumen. No intra or extrahepatic biliary ductal dilatation. Nonobstructing nephrolithiasis bilaterally. Nonspecific perinephric stranding bilaterally. No evidence of hydronephrosis. Multiple loops of dilated and fluid-filled small bowel throughout the abdomen. The source of obstruction appears to be the ileocecal junction. There is diffuse submucosal edema of the cecum in the region of the ileocecal junction. Evaluations significantly limited in the absence of intravenous contrast material. The appendix is identified and is normal. There is associated mesenteric edema and small volume ascites. Colonic diverticular disease without CT evidence of active inflammation. Pelvis: Surgical changes of prior prostatectomy. The bladder is unremarkable. Musculoskeletal: No acute fracture or aggressive appearing lytic or blastic osseous lesion. Vascular: Limited evaluation in the absence of intravenous contrast. Moderate atherosclerotic vascular calcifications without aneurysm. IMPRESSION: 1. High-grade distal small bowel obstruction. The location of the obstruction appears to be at the ileocecal junction were there is diffuse submucosal edema involving the cecum.  Differential considerations include terminal ileitis, typhlitis with secondary obstruction, and potentially an obstructing ileocecal mass. Evaluation is limited in the absence of intravenous contrast. There is associated mesenteric edema and small volume ascites related to this process. 2. Bilateral nonobstructing nephrolithiasis. 3. The appendix is identified and is normal. 4. Colonic diverticular disease without CT evidence of active inflammation. 5. Pulmonary emphysema and chronic interstitial prominence. 6. Small right-sided pleural effusion. 7. Borderline cardiomegaly. 8. Cholelithiasis without evidence of acute cholecystitis. 9. Nonspecific perinephric stranding bilaterally consistent with the clinical history of renal dysfunction. 10. Surgical changes of prior prostatectomy. 11. Atherosclerotic vascular calcifications. Electronically Signed   By: Jacqulynn Cadet M.D.   On: 09/18/2015 20:50   Dg Chest Portable 1 View  09/18/2015  CLINICAL DATA:  Generalized abdominal pain. EXAM: PORTABLE CHEST 1 VIEW COMPARISON:  August 26, 2015 FINDINGS: The cardiomediastinal silhouette is stable. A torturous thoracic aorta is again identified. No pneumothorax. No pulmonary nodules, masses, or focal infiltrates. IMPRESSION: No active disease. Electronically Signed   By:  Dorise Bullion III M.D   On: 09/18/2015 18:08   Dg Abd 2 Views  09/22/2015  CLINICAL DATA:  Follow-up of small bowel obstruction EXAM: ABDOMEN - 2 VIEW COMPARISON:  Acute abdominal series dated February 16, 2016 FINDINGS: There has been reaccumulation of considerable gas within mildly distended small bowel loops in the mid and upper abdomen. There are numerous air-fluid levels. There small amounts of fluid and gas in the colon. There is some rectal gas. No free extraluminal gas collections are observed. The bones are osteopenic. There surgical clips in the pelvis from previous prostatectomy and lymph node dissection. IMPRESSION: Redevelopment of a mid to  distal small bowel obstruction that appears fairly high-grade. There is no evidence of perforation. Electronically Signed   By: [name]  Martinique M.D.   On: 09/22/2015 07:46   Dg Abd 2 Views  09/19/2015  CLINICAL DATA:  Lower abdominal pain, small-bowel obstruction EXAM: ABDOMEN - 2 VIEW COMPARISON:  09/18/2015 FINDINGS: NG tube tip is in the proximal stomach with the side port in the distal esophagus. Nonobstructive bowel gas pattern. No free air organomegaly. No suspicious calcification. Surgical clips in the lower pelvis. IMPRESSION: NG tube tip in the proximal stomach with the side port in the distal esophagus. No evidence of obstruction or free air. Electronically Signed   By: Rolm Baptise M.D.   On: 09/19/2015 11:45   Dg Abd Portable 1v  09/18/2015  CLINICAL DATA:  Abdominal pain for 1 day EXAM: PORTABLE ABDOMEN - 1 VIEW COMPARISON:  08/26/2015 FINDINGS: Scattered large and small bowel gas is noted. No obstructive changes are noted. Postoperative changes are seen. No free air is noted. No acute bony abnormality is noted. IMPRESSION: No acute abnormality seen. Electronically Signed   By: Inez Catalina M.D.   On: 09/18/2015 18:12    Microbiology: No results found for this or any previous visit (from the past 240 hour(s)).   Labs: Basic Metabolic Panel:  Recent Labs Lab 09/25/15 0613 09/26/15 0430 09/27/15 0304 09/28/15 0435 09/29/15 0450  NA 142 138 140 138 138  K 3.3* 3.7 4.4 4.5 4.3  CL 110 107 112* 112* 106  CO2 23 21* 21* 19* 21*  GLUCOSE 88 94 100* 99 108*  BUN 15 17 17 16 15   CREATININE 1.29* 1.26* 1.18 1.34* 1.42*  CALCIUM 8.3* 8.3* 8.1* 8.3* 8.4*  MG 1.5* 1.8 1.7  --   --    Liver Function Tests: No results for input(s): AST, ALT, ALKPHOS, BILITOT, PROT, ALBUMIN in the last 168 hours. No results for input(s): LIPASE, AMYLASE in the last 168 hours. No results for input(s): AMMONIA in the last 168 hours. CBC:  Recent Labs Lab 09/23/15 0447 09/25/15 0613 09/26/15 0430  09/27/15 0304 09/28/15 0435  WBC 7.2 9.1 9.0 8.6 7.7  HGB 10.7* 12.0* 12.2* 12.1* 12.4*  HCT 33.9* 36.8* 37.5* 38.7* 39.7  MCV 93.4 92.5 91.7 91.1 91.1  PLT 247 289 306 318 314   Cardiac Enzymes: No results for input(s): CKTOTAL, CKMB, CKMBINDEX, TROPONINI in the last 168 hours. BNP: BNP (last 3 results)  Recent Labs  11/29/14 1053 12/01/14 0530  BNP 1513.0* 424.9*    ProBNP (last 3 results) No results for input(s): PROBNP in the last 8760 hours.  CBG:  Recent Labs Lab 09/28/15 0001 09/28/15 0640 09/28/15 1738 09/28/15 2339 09/29/15 0745  GLUCAP 100* 99 107* 98 86       Signed:  Azarius Lambson  [facility] 09/29/2015, 10:51 AM

## 2015-09-29 NOTE — Clinical Social Work Note (Signed)
CSW spoke with RN at [facility] who states patient is able to return today.  Paperwork has been sent to ALF for review.  Son is to transport patient.  Son, [name] will be at the hospital at 2pm for transfer to ALF.  CSW updated RN.  All parties agreeable to this discharge plan.  Nonnie Done, LCSW [phone]  5N1-9; 2S 15-16 and [place] Licensed Clinical Social Worker

## 2015-09-29 NOTE — Progress Notes (Signed)
Physical Therapy Treatment Patient Details Name: Logan Day MRN: [id number] DOB: 1933/02/19 Today's Date: 09/29/2015    History of Present Illness Patient is a 82 y.o. male with history of atrial fibrillation on Xarelto, stroke, dementia. Recently presented for complaints of GI bleeding s/p ex lap presents with abdominal pain, nausea, and vomiting.  Patient also with LUE DVT.    PT Comments    Patient was able to progress with ambulation. He required min A and use of RW for safest ambulation. He continues to drift and requires cues for safety and balance. At this time continue to recommend SNF unless ALF can provide assistance for safe transfer and gait.   Follow Up Recommendations  SNF;Supervision/Assistance - 24 hour     Equipment Recommendations       Recommendations for Other Services       Precautions / Restrictions Precautions Precautions: Fall Precaution Comments: DNR Restrictions Weight Bearing Restrictions: No    Mobility  Bed Mobility Overal bed mobility: Needs Assistance Bed Mobility: Supine to Sit;Sit to Supine     Supine to sit: Min guard     General bed mobility comments: Increased effort required and use of rails.   Transfers Overall transfer level: Needs assistance Equipment used: None   Sit to Stand: Supervision         General transfer comment: Supervision for safety.   Ambulation/Gait Ambulation/Gait assistance: Min assist Ambulation Distance (Feet): 120 Feet Assistive device: Rolling walker (2 wheeled) Gait Pattern/deviations: Step-through pattern;Decreased stride length;Drifts right/left;Trunk flexed Gait velocity: decreased   General Gait Details: Max verbal cues for upright posture. Mod cues for safe use of RW. Min A for balance and safety with gait. Patient drifts with gait and needs cues to avoid obstacles. Decreased balance with head turns and cannot change gait speed.    Stairs            Wheelchair Mobility     Modified Rankin (Stroke Patients Only)       Balance     Sitting balance-Leahy Scale: Normal       Standing balance-Leahy Scale: Fair                      Cognition Arousal/Alertness: Awake/alert Behavior During Therapy: WFL for tasks assessed/performed Overall Cognitive Status: No family/caregiver present to determine baseline cognitive functioning                      Exercises      General Comments        Pertinent Vitals/Pain Pain Assessment: No/denies pain    Home Living                      Prior Function            PT Goals (current goals can now be found in the care plan section) Progress towards PT goals: Progressing toward goals    Frequency  Min 3X/week    PT Plan Current plan remains appropriate    Co-evaluation             End of Session Equipment Utilized During Treatment: Gait belt Activity Tolerance: Patient tolerated treatment well Patient left: in chair;with chair alarm set     Time: 0938-1000 PT Time Calculation (min) (ACUTE ONLY): 22 min  Charges:  $Gait Training: 8-22 mins                    G Codes:  Jacqualyn Posey 09/29/2015, 10:25 AM 09/29/2015 Jacqualyn Posey PTA

## 2015-09-29 NOTE — Progress Notes (Signed)
Pt has orders to be discharged back to [facility]. Telemetry box removed. IV removed and site in good condition. Pt stable and waiting for transportation back via son.   Maurene Capes RN

## 2015-09-29 NOTE — Discharge Instructions (Signed)
[facility]      [facility] [phone]  OPEN ABDOMINAL SURGERY: POST OP INSTRUCTIONS  Always review your discharge instruction sheet given to you by the facility where your surgery was performed.  IF YOU HAVE DISABILITY OR FAMILY LEAVE FORMS, YOU MUST BRING THEM TO THE OFFICE FOR PROCESSING.  PLEASE DO NOT GIVE THEM TO YOUR DOCTOR.  1. A prescription for pain medication may be given to you upon discharge.  Take your pain medication as prescribed, if needed.  If narcotic pain medicine is not needed, then you may take acetaminophen (Tylenol) or ibuprofen (Advil) as needed. 2. Take your usually prescribed medications unless otherwise directed. 3. If you need a refill on your pain medication, please contact your pharmacy. They will contact our office to request authorization.  Prescriptions will not be filled after 5pm or on week-ends. 4. You should follow a light diet the first few days after arrival home, such as soup and crackers, pudding, etc.unless your doctor has advised otherwise. A high-fiber, low fat diet can be resumed as tolerated.   Be sure to include lots of fluids daily. Most patients will experience some swelling and bruising on the chest and neck area.  Ice packs will help.  Swelling and bruising can take several days to resolve 5. Most patients will experience some swelling and bruising in the area of the incision. Ice pack will help. Swelling and bruising can take several days to resolve..  6. It is common to experience some constipation if taking pain medication after surgery.  Increasing fluid intake and taking a stool softener will usually help or prevent this problem from occurring.  A mild laxative (Milk of Magnesia or Miralax) should be taken according to package directions if there are no bowel movements after 48 hours. 7.  You may have steri-strips (small skin tapes) in place directly over the incision.  These strips should be left on the skin for 7-10 days.  If your  surgeon used skin glue on the incision, you may shower in 24 hours.  The glue will flake off over the next 2-3 weeks.  Any sutures or staples will be removed at the office during your follow-up visit. You may find that a light gauze bandage over your incision may keep your staples from being rubbed or pulled. You may shower and replace the bandage daily. 8. ACTIVITIES:  You may resume regular (light) daily activities beginning the next day--such as daily self-care, walking, climbing stairs--gradually increasing activities as tolerated.  You may have sexual intercourse when it is comfortable.  Refrain from any heavy lifting or straining until approved by your doctor. a. You may drive when you no longer are taking prescription pain medication, you can comfortably wear a seatbelt, and you can safely maneuver your car and apply brakes b. Return to Work: ___________________________________ 9. You should see your doctor in the office for a follow-up appointment approximately two weeks after your surgery.  Make sure that you call for this appointment within a day or two after you arrive home to insure a convenient appointment time. OTHER INSTRUCTIONS:  _____________________________________________________________ _____________________________________________________________  WHEN TO CALL YOUR DOCTOR: 1. Fever over 101.0 2. Inability to urinate 3. Nausea and/or vomiting 4. Extreme swelling or bruising 5. Continued bleeding from incision. 6. Increased pain, redness, or drainage from the incision. 7. Difficulty swallowing or breathing 8. Muscle cramping or spasms. 9. Numbness or tingling in hands or feet or around lips.  The clinic staff is available to   answer your questions during regular business hours.  Please don't hesitate to call and ask to speak to one of the nurses if you have concerns.  For further questions, please visit [email or web address]   

## 2015-09-29 NOTE — Care Management Important Message (Signed)
Important Message  Patient Details  Name: Logan Day MRN: [id number] Date of Birth: 18-May-1933   Medicare Important Message Given:  Yes    Azlee Monforte P Albert Devaul 09/29/2015, 4:41 PM

## 2015-09-29 NOTE — Progress Notes (Signed)
6 Days Post-Op  Subjective: Pt doing well with no issues Tol Reg po w/ no abd pain  Objective: Vital signs in last 24 hours: Temp:  [97.9 F (36.6 C)-98.4 F (36.9 C)] 98.4 F (36.9 C) (02/27 0404) Pulse Rate:  [80-89] 80 (02/27 0404) Resp:  [18] 18 (02/27 0404) BP: (132-134)/(67-74) 132/74 mmHg (02/27 0404) SpO2:  [98 %] 98 % (02/27 0404) Weight:  [71.2 kg (156 lb 15.5 oz)] 71.2 kg (156 lb 15.5 oz) (02/27 0404) Last BM Date:  (pt had a small bowel obstruction)  Intake/Output from previous day: 02/26 0701 - 02/27 0700 In: 460 [P.O.:460] Out: 3 [Urine:1; Stool:2] Intake/Output this shift: Total I/O In: 240 [P.O.:240] Out: 325 [Urine:325]  General appearance: alert and cooperative GI: soft, non-tender; bowel sounds normal; no masses,  no organomegaly  Lab Results:   Recent Labs  09/27/15 0304 09/28/15 0435  WBC 8.6 7.7  HGB 12.1* 12.4*  HCT 38.7* 39.7  PLT 318 314   BMET  Recent Labs  09/28/15 0435 09/29/15 0450  NA 138 138  K 4.5 4.3  CL 112* 106  CO2 19* 21*  GLUCOSE 99 108*  BUN 16 15  CREATININE 1.34* 1.42*  CALCIUM 8.3* 8.4*   PT/INR No results for input(s): LABPROT, INR in the last 72 hours. ABG No results for input(s): PHART, HCO3 in the last 72 hours.  Invalid input(s): PCO2, PO2  Studies/Results: No results found.  Anti-infectives: Anti-infectives    Start     Dose/Rate Route Frequency Ordered Stop   09/18/15 2300  cefTRIAXone (ROCEPHIN) 1 g in dextrose 5 % 50 mL IVPB  Status:  Discontinued     1 g 100 mL/hr over 30 Minutes Intravenous Every 24 hours 09/18/15 2251 09/23/15 1239   09/18/15 2115  cefTRIAXone (ROCEPHIN) 1 g in dextrose 5 % 50 mL IVPB     1 g 100 mL/hr over 30 Minutes Intravenous  Once 09/18/15 2106 09/18/15 2308      Assessment/Plan: s/p Procedure(s): EXPLORATORY LAPAROTOMY (N/A) LYSIS OF ADHESION  (N/A) OK for DC from surgery standpoint.    LOS: 11 days    Rosario Jacks., Anne Hahn 09/29/2015

## 2015-10-27 ENCOUNTER — Emergency Department (HOSPITAL_COMMUNITY)
Admission: EM | Admit: 2015-10-27 | Discharge: 2015-10-28 | Disposition: A | Discharge status: Home / Self Care | Payer: Medicare Other | Attending: Emergency Medicine | Admitting: Emergency Medicine

## 2015-10-27 ENCOUNTER — Encounter (HOSPITAL_COMMUNITY): Payer: Self-pay | Admitting: Emergency Medicine

## 2015-10-27 ENCOUNTER — Emergency Department (HOSPITAL_COMMUNITY): Payer: Medicare Other

## 2015-10-27 DIAGNOSIS — I25119 Atherosclerotic heart disease of native coronary artery with unspecified angina pectoris: Secondary | ICD-10-CM | POA: Diagnosis not present

## 2015-10-27 DIAGNOSIS — Z87891 Personal history of nicotine dependence: Secondary | ICD-10-CM | POA: Diagnosis not present

## 2015-10-27 DIAGNOSIS — Z8673 Personal history of transient ischemic attack (TIA), and cerebral infarction without residual deficits: Secondary | ICD-10-CM | POA: Insufficient documentation

## 2015-10-27 DIAGNOSIS — K219 Gastro-esophageal reflux disease without esophagitis: Secondary | ICD-10-CM | POA: Diagnosis not present

## 2015-10-27 DIAGNOSIS — E785 Hyperlipidemia, unspecified: Secondary | ICD-10-CM | POA: Diagnosis not present

## 2015-10-27 DIAGNOSIS — F0390 Unspecified dementia without behavioral disturbance: Secondary | ICD-10-CM | POA: Diagnosis not present

## 2015-10-27 DIAGNOSIS — J159 Unspecified bacterial pneumonia: Secondary | ICD-10-CM | POA: Diagnosis not present

## 2015-10-27 DIAGNOSIS — Z79899 Other long term (current) drug therapy: Secondary | ICD-10-CM | POA: Diagnosis not present

## 2015-10-27 DIAGNOSIS — Z8546 Personal history of malignant neoplasm of prostate: Secondary | ICD-10-CM | POA: Diagnosis not present

## 2015-10-27 DIAGNOSIS — E7800 Pure hypercholesterolemia, unspecified: Secondary | ICD-10-CM | POA: Diagnosis not present

## 2015-10-27 DIAGNOSIS — I10 Essential (primary) hypertension: Secondary | ICD-10-CM | POA: Insufficient documentation

## 2015-10-27 DIAGNOSIS — I252 Old myocardial infarction: Secondary | ICD-10-CM | POA: Insufficient documentation

## 2015-10-27 DIAGNOSIS — J441 Chronic obstructive pulmonary disease with (acute) exacerbation: Secondary | ICD-10-CM | POA: Diagnosis not present

## 2015-10-27 DIAGNOSIS — Z7982 Long term (current) use of aspirin: Secondary | ICD-10-CM | POA: Diagnosis not present

## 2015-10-27 DIAGNOSIS — I509 Heart failure, unspecified: Secondary | ICD-10-CM | POA: Insufficient documentation

## 2015-10-27 DIAGNOSIS — R05 Cough: Secondary | ICD-10-CM | POA: Diagnosis present

## 2015-10-27 DIAGNOSIS — J189 Pneumonia, unspecified organism: Secondary | ICD-10-CM

## 2015-10-27 LAB — CBC WITH DIFFERENTIAL/PLATELET
BASOS ABS: 0 10*3/uL (ref 0.0–0.1)
Basophils Relative: 1 %
Eosinophils Absolute: 0.6 10*3/uL (ref 0.0–0.7)
Eosinophils Relative: 10 %
HEMATOCRIT: 35.5 % — AB (ref 39.0–52.0)
Hemoglobin: 11.6 g/dL — ABNORMAL LOW (ref 13.0–17.0)
LYMPHS ABS: 1.2 10*3/uL (ref 0.7–4.0)
LYMPHS PCT: 19 %
MCH: 29.7 pg (ref 26.0–34.0)
MCHC: 32.7 g/dL (ref 30.0–36.0)
MCV: 91 fL (ref 78.0–100.0)
MONO ABS: 0.4 10*3/uL (ref 0.1–1.0)
Monocytes Relative: 6 %
NEUTROS ABS: 3.9 10*3/uL (ref 1.7–7.7)
Neutrophils Relative %: 64 %
Platelets: 266 10*3/uL (ref 150–400)
RBC: 3.9 MIL/uL — ABNORMAL LOW (ref 4.22–5.81)
RDW: 19.1 % — AB (ref 11.5–15.5)
WBC: 6.1 10*3/uL (ref 4.0–10.5)

## 2015-10-27 MED ORDER — IPRATROPIUM-ALBUTEROL 0.5-2.5 (3) MG/3ML IN SOLN
3.0000 mL | RESPIRATORY_TRACT | Status: DC
  Administered 2015-10-28: 3 mL via RESPIRATORY_TRACT
  Filled 2015-10-27: qty 3

## 2015-10-27 MED ORDER — AZITHROMYCIN 250 MG PO TABS
500.0000 mg | ORAL_TABLET | Freq: Once | ORAL | Status: AC
Start: 2015-10-28 — End: 2015-10-28
  Administered 2015-10-28: 500 mg via ORAL
  Filled 2015-10-27: qty 2

## 2015-10-27 NOTE — ED Notes (Addendum)
Per EMS, patient from [facility]. Patient with cough and emesis that is "dark" in appearance, also c/o burning sensation to his abd. Patient has hx of GI bleed. Facility requested patient be evaluated. Patient has no complaints. When questioned if he has acid reflux he says [place], and when questioned if it feels the same he says yes. Symptoms began @ 2 hours ago after eating dinner.

## 2015-10-27 NOTE — ED Notes (Signed)
Bed: WA09 Expected date:  Expected time:  Means of arrival:  Comments: 82 yr cough

## 2015-10-27 NOTE — ED Provider Notes (Signed)
CSN: [id number]     Arrival date & time 10/27/15  2151 History  By signing my name below, I, Rohini Rajnarayanan, attest that this documentation has been prepared under the direction and in the presence of Everlene Balls, MD Electronically Signed: Evonnie Dawes, ED Scribe 10/27/2015 at 11:59 PM.   Chief Complaint  Patient presents with  . Cough  . Emesis   The history is provided by the patient and a relative. No language interpreter was used.    HPI Comments: Logan Day is a 82 y.o. male brought in by ambulance from [facility], with a pmhx of COPD, GI-bleed, GERD, TIA, HLD, MI, AFib, HTN, and CAD, who presents to the Emergency Department complaining of an exacerbation of nausea and emesis with associated clear, colorless phlegm, which occurred earlier tonight. Pt attempted to eat [employer] tonight, but felt nauseous and had dark-colored emesis which contained the food that he had eaten. Pt's emesis was most probably dark in color due to his food intake today. Pt usually coughs to attempt to clear his airway, which causes him to feel nauseous. The associated phlegm is usually exacerbated at night time. Today's sx are similar to those experienced in the past. Pt's son assumes that sx are associated with his COPD. Pt was taking Warfarin, and later Coumadin, however he is not taking any blood thinners at this time.    Past Medical History  Diagnosis Date  . Hypertension   . Atrial fibrillation ([place])   . Hypercholesteremia   . MI (myocardial infarction) ([place])     x 2  . Angina   . Cancer [facility])     prostate cancer  . Coronary artery disease     Circ stent (2009 cath patent)  . Prostate cancer ([place])   . Hyperlipidemia   . TIA (transient ischemic attack)   . GERD (gastroesophageal reflux disease)   . Shortness of breath dyspnea   . COPD (chronic obstructive pulmonary disease) ([place])   . Dementia   . Congestive heart failure ([place])   . GI bleed 08/2015   Past Surgical  History  Procedure Laterality Date  . Prostayectomy    . Fracture surgery    . Coronary angioplasty with stent placement  10/19/2012    DES  to circumflex  . Percutaneous coronary stent intervention (pci-s) N/A 10/19/2012    Procedure: PERCUTANEOUS CORONARY STENT INTERVENTION (PCI-S);  Surgeon: Sinclair Grooms, MD;  Location: [facility] CATH LAB;  Service: Cardiovascular;  Laterality: N/A;  . Laparotomy N/A 09/23/2015    Procedure: EXPLORATORY LAPAROTOMY;  Surgeon: Georganna Skeans, MD;  Location: [place];  Service: General;  Laterality: N/A;  . Lysis of adhesion N/A 09/23/2015    Procedure: LYSIS OF ADHESION ;  Surgeon: Georganna Skeans, MD;  Location: [facility] OR;  Service: General;  Laterality: N/A;   Family History  Problem Relation Age of Onset  . Coronary artery disease Son   . Heart disease Mother   . Heart disease Father    Social History  Substance Use Topics  . Smoking status: Former Smoker -- 1.00 packs/day for 40 years  . Smokeless tobacco: Former Systems developer    Types: Chew     Comment: quit smoking 2002  . Alcohol Use: No    Review of Systems  10 Systems reviewed and all are negative for acute change except as noted in the HPI.  Allergies  Review of patient's allergies indicates no known allergies.  Home Medications   Prior to Admission  medications   Medication Sig Start Date End Date Taking? Authorizing Provider  acetaminophen (TYLENOL) 500 MG tablet Take 500 mg by mouth 2 (two) times daily.   Yes Historical Provider, MD  albuterol (PROAIR HFA) 108 (90 BASE) MCG/ACT inhaler Inhale 1-2 puffs into the lungs every 6 (six) hours as needed for wheezing or shortness of breath. 05/06/15  Yes Pleas Koch, NP  ALPRAZolam Duanne Moron) 0.5 MG tablet Take 1 tablet (0.5 mg total) by mouth 2 (two) times daily as needed for anxiety. 09/29/15  Yes Maryann Mikhail, DO  amiodarone (PACERONE) 200 MG tablet Take 1 tablet (200 mg total) by mouth daily. 01/22/15  Yes Belva Crome, MD  aspirin EC 81 MG tablet  Take 1 tablet (81 mg total) by mouth daily. Resume on 09/11/15 09/11/15  Yes Shanker Kristeen Mans, MD  bisacodyl (DULCOLAX) 5 MG EC tablet Take 5 mg by mouth daily as needed for moderate constipation.   Yes Historical Provider, MD  buPROPion (WELLBUTRIN SR) 150 MG 12 hr tablet Take 150 mg by mouth 2 (two) times daily.  08/01/14  Yes Historical Provider, MD  ENSURE PLUS (ENSURE PLUS) LIQD Take 237 mLs by mouth 3 (three) times daily.   Yes Historical Provider, MD  ferrous sulfate 325 (65 FE) MG tablet Take 1 tablet (325 mg total) by mouth 2 (two) times daily with a meal. 08/28/15  Yes Shanker Kristeen Mans, MD  nitroGLYCERIN (NITROSTAT) 0.4 MG SL tablet Place 0.4 mg under the tongue every 5 (five) minutes as needed for chest pain.   Yes Historical Provider, MD  pantoprazole (PROTONIX) 40 MG tablet Take 1 tablet (40 mg total) by mouth daily at 12 noon. 08/28/15  Yes Shanker Kristeen Mans, MD  pravastatin (PRAVACHOL) 40 MG tablet Take 40 mg by mouth every evening.   Yes Historical Provider, MD  sertraline (ZOLOFT) 100 MG tablet Take 100 mg by mouth every evening.   Yes Historical Provider, MD  traZODone (DESYREL) 50 MG tablet Take 50 mg by mouth at bedtime.   Yes Historical Provider, MD  feeding supplement (BOOST / RESOURCE BREEZE) LIQD Take 1 Container by mouth 3 (three) times daily between meals. Patient not taking: Reported on 10/27/2015 08/28/15   Jonetta Osgood, MD  oxyCODONE (ROXICODONE) 5 MG/5ML solution Take 5 mLs (5 mg total) by mouth every 4 (four) hours as needed (you are going to have to ask him if he is having pain.). Patient not taking: Reported on 10/27/2015 09/29/15   Maryann Mikhail, DO   BP 163/86 mmHg  Pulse 62  Temp(Src) 98.3 F (36.8 C) (Oral)  Resp 19  SpO2 96% Physical Exam  Constitutional: Vital signs are normal. He appears well-developed and well-nourished.  Non-toxic appearance. He does not appear ill. No distress.  HENT:  Head: Normocephalic and atraumatic.  Nose: Nose normal.   Mouth/Throat: Oropharynx is clear and moist. No oropharyngeal exudate.  Eyes: Conjunctivae and EOM are normal. Pupils are equal, round, and reactive to light. No scleral icterus.  Neck: Normal range of motion. Neck supple. No tracheal deviation, no edema, no erythema and normal range of motion present. No thyroid mass and no thyromegaly present.  Cardiovascular: Normal rate, regular rhythm, S1 normal, S2 normal, normal heart sounds, intact distal pulses and normal pulses.  Exam reveals no gallop and no friction rub.   No murmur heard. Pulmonary/Chest: Effort normal. No respiratory distress. He has wheezes. He has no rhonchi. He has no rales.  Abdominal: Soft. Normal appearance and bowel sounds  are normal. He exhibits no distension, no ascites and no mass. There is no hepatosplenomegaly. There is no tenderness. There is no rebound, no guarding and no CVA tenderness.  Lower abdominal surgical scar, well healed.   Musculoskeletal: Normal range of motion. He exhibits no edema or tenderness.  Lymphadenopathy:    He has no cervical adenopathy.  Neurological: He is alert. He has normal strength. No cranial nerve deficit or sensory deficit.  Skin: Skin is warm, dry and intact. No petechiae and no rash noted. He is not diaphoretic. No erythema. No pallor.  Psychiatric: He has a normal mood and affect. His behavior is normal. Judgment normal.  Nursing note and vitals reviewed.   ED Course  Procedures  DIAGNOSTIC STUDIES: Oxygen Saturation is 96% on RA, low by my interpretation.    COORDINATION OF CARE: 11:12 PM-Discussed treatment plan which includes DG Chest, blood work, cardiac monitoring, and POC occult blood, with pt at bedside and pt agreed to plan.   Labs Review Labs Reviewed  CBC WITH DIFFERENTIAL/PLATELET - Abnormal; Notable for the following:    RBC 3.90 (*)    Hemoglobin 11.6 (*)    HCT 35.5 (*)    RDW 19.1 (*)    All other components within normal limits  COMPREHENSIVE METABOLIC  PANEL - Abnormal; Notable for the following:    BUN 22 (*)    Creatinine, Ser 1.44 (*)    Calcium 8.8 (*)    GFR calc non Af Amer 44 (*)    GFR calc Af Amer 51 (*)    All other components within normal limits  LIPASE, BLOOD  POC OCCULT BLOOD, ED  TYPE AND SCREEN  ABO/RH    Imaging Review Dg Chest 2 View  10/27/2015  CLINICAL DATA:  Acute onset of cough and vomiting. Burning sensation at the mid abdomen. Initial encounter. EXAM: CHEST  2 VIEW COMPARISON:  Chest radiograph from 09/18/2015 FINDINGS: The lungs are well-aerated. Mild bibasilar opacities may reflect mild pneumonia. Pulmonary vascularity is at the upper limits of normal. There is no evidence of pleural effusion or pneumothorax. The heart is normal in size; the mediastinal contour is within normal limits. No acute osseous abnormalities are seen. IMPRESSION: Mild bibasilar airspace opacities may reflect mild pneumonia. Electronically Signed   By: Garald Balding M.D.   On: 10/27/2015 23:22   I have personally reviewed and evaluated these images and lab results as part of my medical decision-making.   EKG Interpretation None      MDM   Final diagnoses:  None   Patient presents to the ED for vomiting possibly blood.  He informs me that he had just eaten a  [employer] jo[employer].  He does have a history of GI bleed in the past however so will obtain labs and hemoccult card for evaluation.  He currently appears well and in NAD.  CXR reveals pneumonia, possible cause of the patients worsening cough.  He was given azithromycin for treatment will be sent home with 4 more days.  Hemoccult negative and hgb stable.  He appears well and in NAD.  No hypoxia on RA.  VS remain within his normal limits and he is safe for DC.   I personally performed the services described in this documentation, which was scribed in my presence. The recorded information has been reviewed and is accurate.       Everlene Balls, MD 10/28/15 [phone]

## 2015-10-28 LAB — COMPREHENSIVE METABOLIC PANEL
ALT: 18 U/L (ref 17–63)
ANION GAP: 9 (ref 5–15)
AST: 32 U/L (ref 15–41)
Albumin: 3.5 g/dL (ref 3.5–5.0)
Alkaline Phosphatase: 48 U/L (ref 38–126)
BUN: 22 mg/dL — ABNORMAL HIGH (ref 6–20)
CHLORIDE: 103 mmol/L (ref 101–111)
CO2: 25 mmol/L (ref 22–32)
Calcium: 8.8 mg/dL — ABNORMAL LOW (ref 8.9–10.3)
Creatinine, Ser: 1.44 mg/dL — ABNORMAL HIGH (ref 0.61–1.24)
GFR calc non Af Amer: 44 mL/min — ABNORMAL LOW (ref >60)
GFR, EST AFRICAN AMERICAN: 51 mL/min — AB (ref >60)
Glucose, Bld: 91 mg/dL (ref 65–99)
Potassium: 3.9 mmol/L (ref 3.5–5.1)
SODIUM: 137 mmol/L (ref 135–145)
Total Bilirubin: 0.3 mg/dL (ref 0.3–1.2)
Total Protein: 7 g/dL (ref 6.5–8.1)

## 2015-10-28 LAB — TYPE AND SCREEN
ABO/RH(D): A POS
ANTIBODY SCREEN: NEGATIVE

## 2015-10-28 LAB — ABO/RH: ABO/RH(D): A POS

## 2015-10-28 LAB — POC OCCULT BLOOD, ED: FECAL OCCULT BLD: NEGATIVE

## 2015-10-28 LAB — LIPASE, BLOOD: Lipase: 32 U/L (ref 11–51)

## 2015-10-28 MED ORDER — ALBUTEROL SULFATE (2.5 MG/3ML) 0.083% IN NEBU: 2.5000 mg | INHALATION_SOLUTION | Freq: Four times a day (QID) | RESPIRATORY_TRACT | Status: DC | PRN

## 2015-10-28 MED ORDER — AZITHROMYCIN 250 MG PO TABS: 250.0000 mg | ORAL_TABLET | Freq: Every day | ORAL | Status: DC

## 2015-10-28 NOTE — ED Notes (Signed)
Patient ambulated to restroom with stand by assistance. Urine specimen collected. Patient denies needs at this time.

## 2015-10-28 NOTE — Discharge Instructions (Signed)
Community-Acquired Pneumonia, Adult Logan Day, your chest xray shows pneumonia, take antibiotics as directed.  There is no blood in your stool.  Using nebulizer breathing treatments as needed for wheezing and cough.  If any symptoms worsen, come back to the ED immediately. Thank you. Pneumonia is an infection of the lungs. One type of pneumonia can happen while a person is in a hospital. A different type can happen when a person is not in a hospital (community-acquired pneumonia). It is easy for this kind to spread from person to person. It can spread to you if you breathe near an infected person who coughs or sneezes. Some symptoms include:  A dry cough.  A wet (productive) cough.  Fever.  Sweating.  Chest pain. HOME CARE  Take over-the-counter and prescription medicines only as told by your doctor.  Only take cough medicine if you are losing sleep.  If you were prescribed an antibiotic medicine, take it as told by your doctor. Do not stop taking the antibiotic even if you start to feel better.  Sleep with your head and neck raised (elevated). You can do this by putting a few pillows under your head, or you can sleep in a recliner.  Do not use tobacco products. These include cigarettes, chewing tobacco, and e-cigarettes. If you need help quitting, ask your doctor.  Drink enough water to keep your pee (urine) clear or pale yellow. A shot (vaccine) can help prevent pneumonia. Shots are often suggested for:  People older than 80 years of age.  People older than 80 years of age:  Who are having cancer treatment.  Who have long-term (chronic) lung disease.  Who have problems with their body's defense system (immune system). You may also prevent pneumonia if you take these actions:  Get the flu (influenza) shot every year.  Go to the dentist as often as told.  Wash your hands often. If soap and water are not available, use hand sanitizer. GET HELP IF:  You have a  fever.  You lose sleep because your cough medicine does not help. GET HELP RIGHT AWAY IF:  You are short of breath and it gets worse.  You have more chest pain.  Your sickness gets worse. This is very serious if:  You are an older adult.  Your body's defense system is weak.  You cough up blood.   This information is not intended to replace advice given to you by your health care provider. Make sure you discuss any questions you have with your health care provider.   Document Released: 01/05/2008 Document Revised: 04/09/2015 Document Reviewed: 11/13/2014 Elsevier Interactive Patient Education [employer].

## 2015-11-13 ENCOUNTER — Emergency Department (HOSPITAL_COMMUNITY)
Admission: EM | Admit: 2015-11-13 | Discharge: 2015-11-13 | Disposition: A | Discharge status: Home / Self Care | Payer: Medicare Other | Attending: Emergency Medicine | Admitting: Emergency Medicine

## 2015-11-13 ENCOUNTER — Emergency Department (HOSPITAL_COMMUNITY): Payer: Medicare Other

## 2015-11-13 ENCOUNTER — Encounter (HOSPITAL_COMMUNITY): Payer: Self-pay

## 2015-11-13 DIAGNOSIS — J189 Pneumonia, unspecified organism: Secondary | ICD-10-CM

## 2015-11-13 DIAGNOSIS — Z87891 Personal history of nicotine dependence: Secondary | ICD-10-CM | POA: Insufficient documentation

## 2015-11-13 DIAGNOSIS — E7800 Pure hypercholesterolemia, unspecified: Secondary | ICD-10-CM | POA: Diagnosis not present

## 2015-11-13 DIAGNOSIS — I25119 Atherosclerotic heart disease of native coronary artery with unspecified angina pectoris: Secondary | ICD-10-CM | POA: Diagnosis not present

## 2015-11-13 DIAGNOSIS — R05 Cough: Secondary | ICD-10-CM

## 2015-11-13 DIAGNOSIS — Z8546 Personal history of malignant neoplasm of prostate: Secondary | ICD-10-CM | POA: Insufficient documentation

## 2015-11-13 DIAGNOSIS — R1110 Vomiting, unspecified: Secondary | ICD-10-CM | POA: Insufficient documentation

## 2015-11-13 DIAGNOSIS — E785 Hyperlipidemia, unspecified: Secondary | ICD-10-CM | POA: Diagnosis not present

## 2015-11-13 DIAGNOSIS — I4891 Unspecified atrial fibrillation: Secondary | ICD-10-CM | POA: Insufficient documentation

## 2015-11-13 DIAGNOSIS — J392 Other diseases of pharynx: Secondary | ICD-10-CM | POA: Insufficient documentation

## 2015-11-13 DIAGNOSIS — Z9861 Coronary angioplasty status: Secondary | ICD-10-CM | POA: Diagnosis not present

## 2015-11-13 DIAGNOSIS — F0390 Unspecified dementia without behavioral disturbance: Secondary | ICD-10-CM | POA: Diagnosis not present

## 2015-11-13 DIAGNOSIS — Z8673 Personal history of transient ischemic attack (TIA), and cerebral infarction without residual deficits: Secondary | ICD-10-CM | POA: Insufficient documentation

## 2015-11-13 DIAGNOSIS — J449 Chronic obstructive pulmonary disease, unspecified: Secondary | ICD-10-CM | POA: Insufficient documentation

## 2015-11-13 DIAGNOSIS — Z79899 Other long term (current) drug therapy: Secondary | ICD-10-CM | POA: Diagnosis not present

## 2015-11-13 DIAGNOSIS — K219 Gastro-esophageal reflux disease without esophagitis: Secondary | ICD-10-CM | POA: Insufficient documentation

## 2015-11-13 DIAGNOSIS — I10 Essential (primary) hypertension: Secondary | ICD-10-CM | POA: Insufficient documentation

## 2015-11-13 DIAGNOSIS — R059 Cough, unspecified: Secondary | ICD-10-CM

## 2015-11-13 DIAGNOSIS — I252 Old myocardial infarction: Secondary | ICD-10-CM | POA: Insufficient documentation

## 2015-11-13 DIAGNOSIS — J159 Unspecified bacterial pneumonia: Secondary | ICD-10-CM | POA: Insufficient documentation

## 2015-11-13 DIAGNOSIS — I509 Heart failure, unspecified: Secondary | ICD-10-CM | POA: Diagnosis not present

## 2015-11-13 DIAGNOSIS — Z7982 Long term (current) use of aspirin: Secondary | ICD-10-CM | POA: Insufficient documentation

## 2015-11-13 DIAGNOSIS — R198 Other specified symptoms and signs involving the digestive system and abdomen: Secondary | ICD-10-CM

## 2015-11-13 MED ORDER — DOXYCYCLINE HYCLATE 100 MG PO CAPS: 100.0000 mg | ORAL_CAPSULE | Freq: Two times a day (BID) | ORAL | Status: DC

## 2015-11-13 NOTE — ED Notes (Signed)
Per EMS, Pt, from [facility], c/o emesis x 3 starting last night.  Denies pain .  Currently, denies n/v.  Hx of SBO and dementia.

## 2015-11-13 NOTE — ED Provider Notes (Signed)
CSN: [id number]     Arrival date & time 11/13/15  1024 History   First MD Initiated Contact with Patient 11/13/15 1053     Chief Complaint  Patient presents with  . Emesis     (Consider location/radiation/quality/duration/timing/severity/associated sxs/prior Treatment) Patient is a 82 y.o. male presenting with vomiting. The history is provided by the patient.  Emesis Severity:  Mild Duration:  1 hour Timing:  Sporadic Quality:  Stomach contents Chronicity:  New Recent urination:  Normal Context: post-tussive   Relieved by:  Nothing Worsened by:  Nothing tried Ineffective treatments:  None tried Associated symptoms: cough   Associated symptoms: no fever     Past Medical History  Diagnosis Date  . Hypertension   . Atrial fibrillation ([place])   . Hypercholesteremia   . MI (myocardial infarction) ([place])     x 2  . Angina   . Cancer [facility])     prostate cancer  . Coronary artery disease     Circ stent (2009 cath patent)  . Prostate cancer ([place])   . Hyperlipidemia   . TIA (transient ischemic attack)   . GERD (gastroesophageal reflux disease)   . Shortness of breath dyspnea   . COPD (chronic obstructive pulmonary disease) ([place])   . Dementia   . Congestive heart failure ([place])   . GI bleed 08/2015   Past Surgical History  Procedure Laterality Date  . Prostayectomy    . Fracture surgery    . Coronary angioplasty with stent placement  10/19/2012    DES  to circumflex  . Percutaneous coronary stent intervention (pci-s) N/A 10/19/2012    Procedure: PERCUTANEOUS CORONARY STENT INTERVENTION (PCI-S);  Surgeon: Sinclair Grooms, MD;  Location: [facility] CATH LAB;  Service: Cardiovascular;  Laterality: N/A;  . Laparotomy N/A 09/23/2015    Procedure: EXPLORATORY LAPAROTOMY;  Surgeon: Georganna Skeans, MD;  Location: [place];  Service: General;  Laterality: N/A;  . Lysis of adhesion N/A 09/23/2015    Procedure: LYSIS OF ADHESION ;  Surgeon: Georganna Skeans, MD;  Location: [facility] OR;  Service: General;   Laterality: N/A;   Family History  Problem Relation Age of Onset  . Coronary artery disease Son   . Heart disease Mother   . Heart disease Father    Social History  Substance Use Topics  . Smoking status: Former Smoker -- 1.00 packs/day for 40 years  . Smokeless tobacco: Former Systems developer    Types: Chew     Comment: quit smoking 2002  . Alcohol Use: No    Review of Systems  Gastrointestinal: Positive for vomiting.  All other systems reviewed and are negative.     Allergies  Review of patient's allergies indicates no known allergies.  Home Medications   Prior to Admission medications   Medication Sig Start Date End Date Taking? Authorizing Provider  acetaminophen (TYLENOL) 500 MG tablet Take 500 mg by mouth 2 (two) times daily.   Yes Historical Provider, MD  albuterol (PROVENTIL HFA;VENTOLIN HFA) 108 (90 Base) MCG/ACT inhaler Inhale 1-2 puffs into the lungs every 6 (six) hours as needed for wheezing or shortness of breath.   Yes Historical Provider, MD  albuterol (PROVENTIL) (2.5 MG/3ML) 0.083% nebulizer solution Take 3 mLs (2.5 mg total) by nebulization every 6 (six) hours as needed for wheezing or shortness of breath. 10/28/15  Yes Everlene Balls, MD  ALPRAZolam Duanne Moron) 0.5 MG tablet Take 1 tablet (0.5 mg total) by mouth 2 (two) times daily as needed for anxiety. 09/29/15  Yes  Maryann Mikhail, DO  amiodarone (PACERONE) 200 MG tablet Take 1 tablet (200 mg total) by mouth daily. 01/22/15  Yes Belva Crome, MD  aspirin EC 81 MG tablet Take 1 tablet (81 mg total) by mouth daily. Resume on 09/11/15 09/11/15  Yes Shanker Kristeen Mans, MD  bisacodyl (DULCOLAX) 5 MG EC tablet Take 5 mg by mouth daily as needed for moderate constipation.   Yes Historical Provider, MD  buPROPion (WELLBUTRIN SR) 150 MG 12 hr tablet Take 150 mg by mouth 2 (two) times daily.  08/01/14  Yes Historical Provider, MD  ENSURE (ENSURE) Take 237 mLs by mouth 3 (three) times daily.   Yes Historical Provider, MD  ferrous sulfate  325 (65 FE) MG tablet Take 1 tablet (325 mg total) by mouth 2 (two) times daily with a meal. 08/28/15  Yes Shanker Kristeen Mans, MD  levothyroxine (SYNTHROID, LEVOTHROID) 25 MCG tablet Take 25 mcg by mouth daily.   Yes Historical Provider, MD  Multiple Vitamin (DAILY VITE) TABS Take 1 tablet by mouth daily.   Yes Historical Provider, MD  nitroGLYCERIN (NITROSTAT) 0.4 MG SL tablet Place 0.4 mg under the tongue every 5 (five) minutes as needed for chest pain.   Yes Historical Provider, MD  oxyCODONE (ROXICODONE) 5 MG/5ML solution Take 5 mg by mouth every 4 (four) hours as needed for severe pain.   Yes Historical Provider, MD  pantoprazole (PROTONIX) 40 MG tablet Take 1 tablet (40 mg total) by mouth daily at 12 noon. 08/28/15  Yes Shanker Kristeen Mans, MD  pravastatin (PRAVACHOL) 40 MG tablet Take 40 mg by mouth every evening.   Yes Historical Provider, MD  sertraline (ZOLOFT) 100 MG tablet Take 100 mg by mouth every evening.   Yes Historical Provider, MD  traMADol (ULTRAM) 50 MG tablet Take 50 mg by mouth every 6 (six) hours as needed for moderate pain.   Yes Historical Provider, MD  traZODone (DESYREL) 50 MG tablet Take 50 mg by mouth at bedtime.   Yes Historical Provider, MD  doxycycline (VIBRAMYCIN) 100 MG capsule Take 1 capsule (100 mg total) by mouth 2 (two) times daily. 11/13/15   Leo Grosser, MD   BP 126/69 mmHg  Pulse 74  Temp(Src) 98.6 F (37 C) (Oral)  SpO2 94% Physical Exam  Constitutional: He is oriented to person, place, and time. He appears well-developed and well-nourished. No distress.  HENT:  Head: Normocephalic and atraumatic.  Eyes: Conjunctivae are normal.  Neck: Neck supple. No tracheal deviation present.  Cardiovascular: Normal rate, regular rhythm and normal heart sounds.   Pulmonary/Chest: Effort normal and breath sounds normal. No respiratory distress. He has no wheezes. He has no rales. He exhibits no tenderness.  Abdominal: Soft. He exhibits no distension. There is no  tenderness. There is no rebound and no guarding.  Neurological: He is alert and oriented to person, place, and time.  Skin: Skin is warm and dry.  Psychiatric: He has a normal mood and affect.    ED Course  Procedures (including critical care time) Labs Review Labs Reviewed - No data to display  Imaging Review Dg Chest 2 View  11/13/2015  CLINICAL DATA:  Cough. EXAM: CHEST  2 VIEW COMPARISON:  10/27/2015 FINDINGS: COPD with hyperinflation of the lungs. Mild right lower lobe airspace disease posteriorly is unchanged may represent persistent pneumonia. Small right pleural effusion unchanged. Negative for heart failure or pneumonia.  Right coronary stent. IMPRESSION: COPD Mild right lower lobe atelectasis/ infiltrate unchanged. This may represent persistent pneumonia.  Small pleural effusion unchanged. Electronically Signed   By: Franchot Gallo M.D.   On: 11/13/2015 12:07   I have personally reviewed and evaluated these images and lab results as part of my medical decision-making.   EKG Interpretation None      MDM   Final diagnoses:  Gagging episode  Cough  CAP (community acquired pneumonia)    82 y.o. male presents with Persistent cough and 2 episodes of vomiting that occurred after choking on his secretions at home. His x-ray shows a possibly persistent pneumonia after recent antibiotic therapy on azithromycin. Placed on doxycycline to double cover for atypicals. Patient is not septic and is otherwise well-appearing. No evidence of significant clinical progression of pneumonia. Inpatient hospitalization is not warranted today. Plan to follow up with PCP as needed and return precautions discussed for worsening or new concerning symptoms.    Leo Grosser, MD 11/13/15 [phone]

## 2015-11-13 NOTE — ED Notes (Signed)
Bed: WA19 Expected date:  Expected time:  Means of arrival:  Comments: 

## 2015-11-13 NOTE — ED Notes (Signed)
Dr. Laneta Simmers notified of patient's son's request to speak to him.  Patient will be transported home via [place].  Family is unable to transport.   [facility] notified of transport request.

## 2015-11-13 NOTE — Discharge Instructions (Signed)

## 2016-05-24 IMAGING — CR DG CHEST 1V PORT
2 series · 2 of 2 positions shown · non-contrast
Comparison: 11/29/2014

CLINICAL DATA: Newly diagnosed with CHF. Shortness of breath.
Atrial fibrillation.

EXAM:
PORTABLE CHEST - 1 VIEW

[AP (1 of 2)]
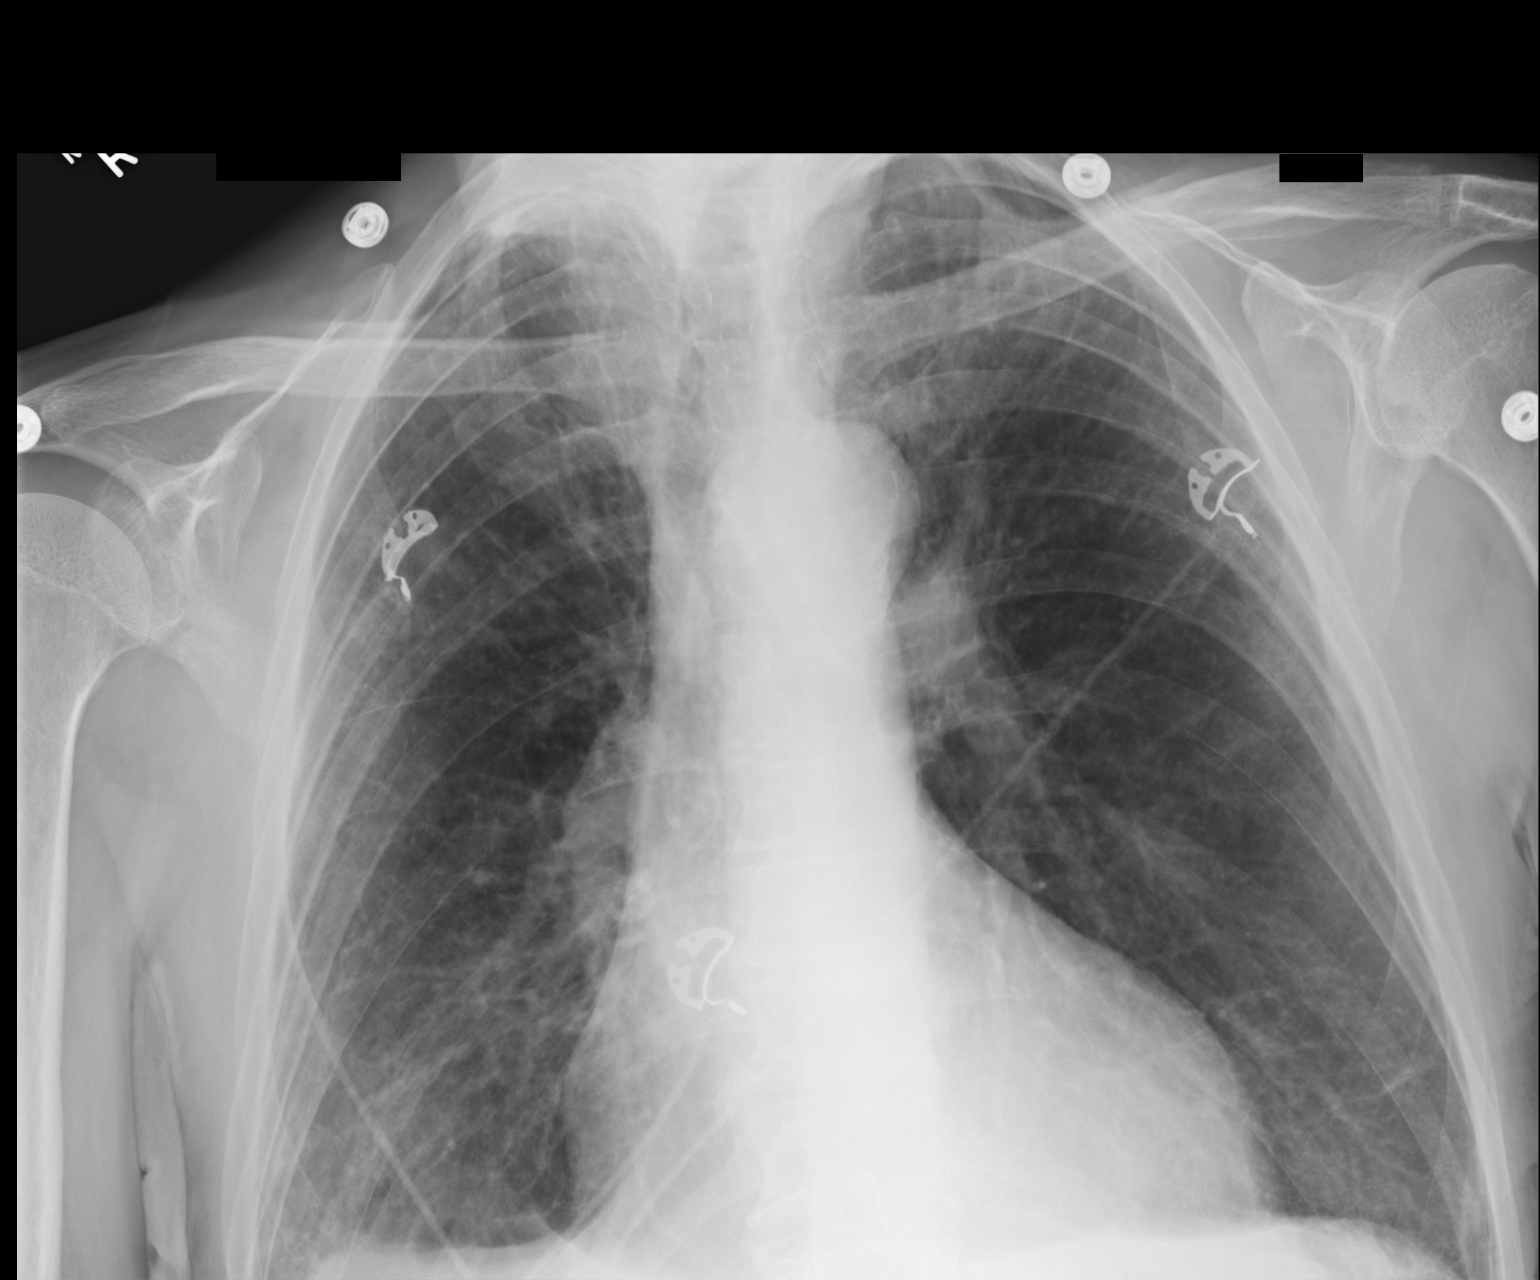

[AP (2 of 2)]
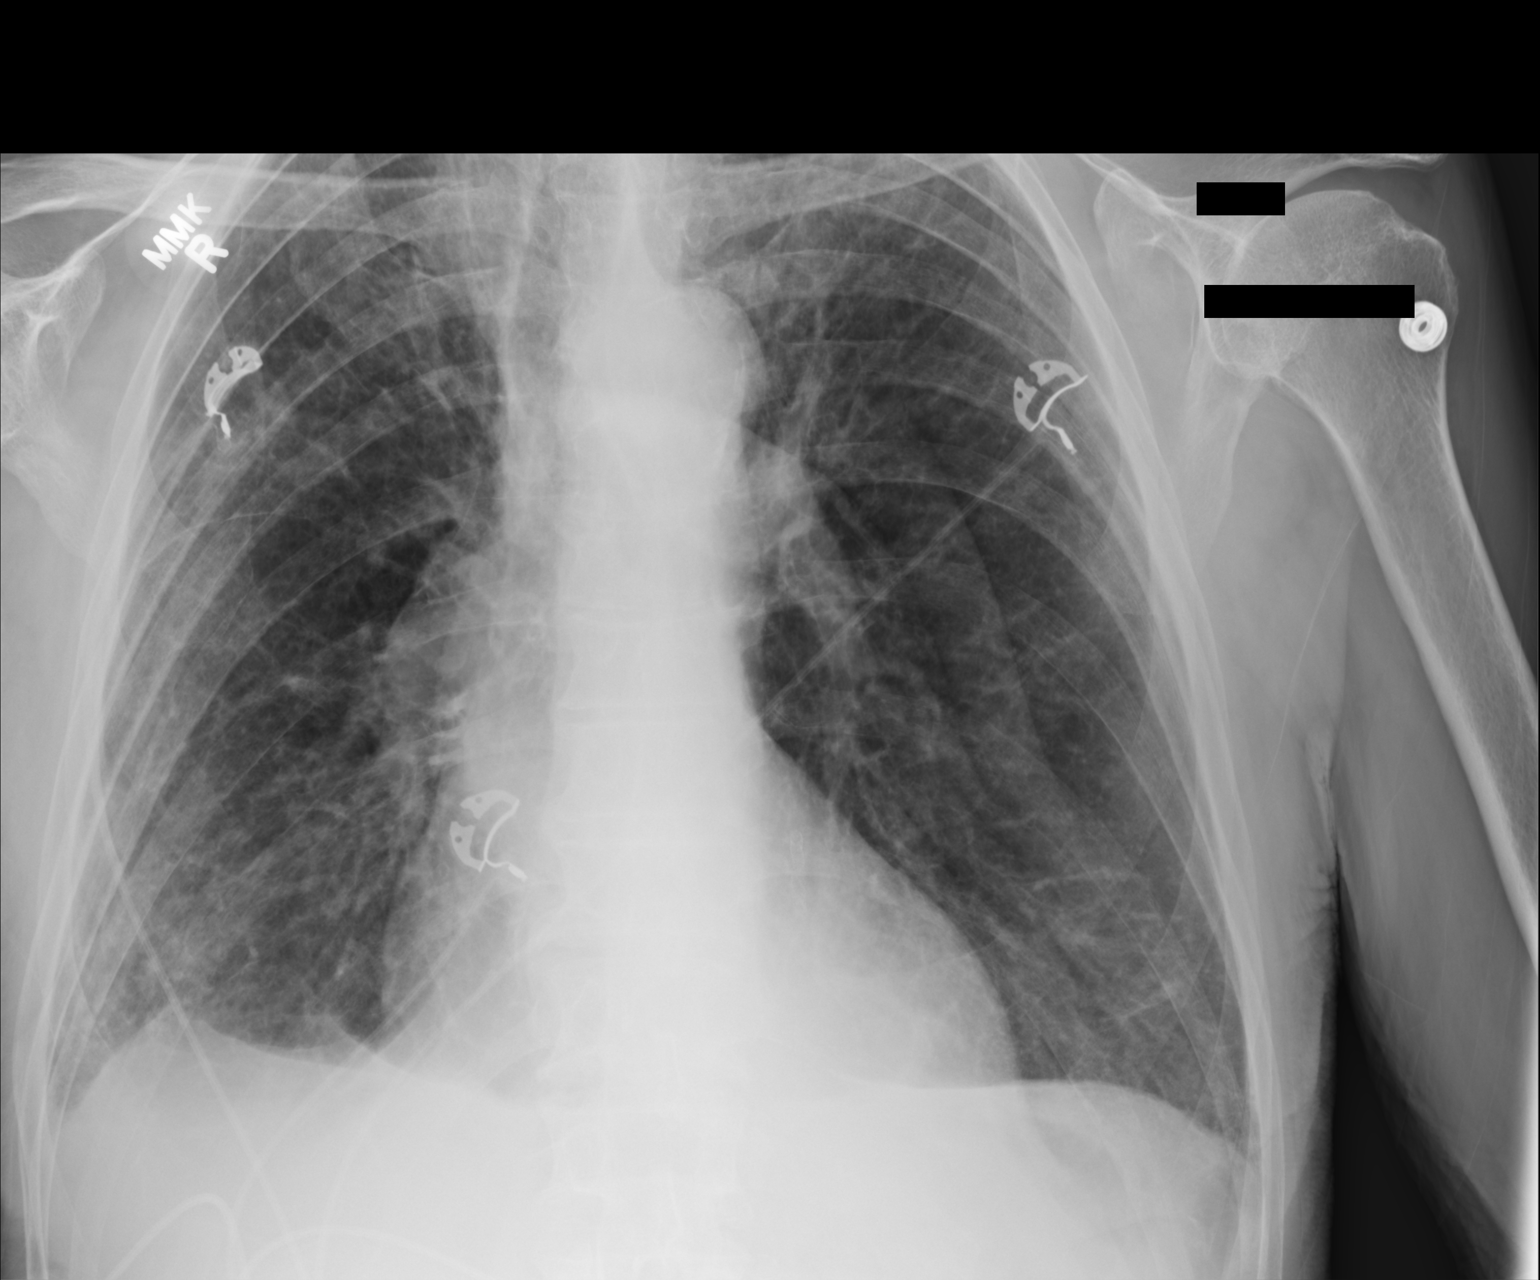

[2 of 2 positions shown; findings below may reference images not displayed]

FINDINGS: Heart is enlarged. There is mild interstitial pulmonary edema
slightly improved compared to prior study. Suspect small bilateral
pleural effusions. No consolidations.
IMPRESSION: Slight improvement in interstitial edema.

## 2016-11-15 ENCOUNTER — Encounter (HOSPITAL_COMMUNITY): Payer: Self-pay | Admitting: Nurse Practitioner

## 2016-11-15 ENCOUNTER — Inpatient Hospital Stay (HOSPITAL_COMMUNITY)
Admission: EM | Admit: 2016-11-15 | Discharge: 2016-11-17 | DRG: 177 | Disposition: A | Discharge status: Nursing Home (Includes ICF) | Payer: Medicare Other | Attending: Family Medicine | Admitting: Family Medicine

## 2016-11-15 ENCOUNTER — Emergency Department (HOSPITAL_COMMUNITY): Payer: Medicare Other

## 2016-11-15 DIAGNOSIS — J9601 Acute respiratory failure with hypoxia: Secondary | ICD-10-CM | POA: Diagnosis present

## 2016-11-15 DIAGNOSIS — R0602 Shortness of breath: Secondary | ICD-10-CM | POA: Diagnosis not present

## 2016-11-15 DIAGNOSIS — Z955 Presence of coronary angioplasty implant and graft: Secondary | ICD-10-CM

## 2016-11-15 DIAGNOSIS — R945 Abnormal results of liver function studies: Secondary | ICD-10-CM

## 2016-11-15 DIAGNOSIS — J189 Pneumonia, unspecified organism: Secondary | ICD-10-CM | POA: Diagnosis not present

## 2016-11-15 DIAGNOSIS — F0390 Unspecified dementia without behavioral disturbance: Secondary | ICD-10-CM | POA: Diagnosis present

## 2016-11-15 DIAGNOSIS — I10 Essential (primary) hypertension: Secondary | ICD-10-CM | POA: Diagnosis present

## 2016-11-15 DIAGNOSIS — T380X5A Adverse effect of glucocorticoids and synthetic analogues, initial encounter: Secondary | ICD-10-CM | POA: Diagnosis present

## 2016-11-15 DIAGNOSIS — I2510 Atherosclerotic heart disease of native coronary artery without angina pectoris: Secondary | ICD-10-CM | POA: Diagnosis present

## 2016-11-15 DIAGNOSIS — N182 Chronic kidney disease, stage 2 (mild): Secondary | ICD-10-CM | POA: Diagnosis not present

## 2016-11-15 DIAGNOSIS — F419 Anxiety disorder, unspecified: Secondary | ICD-10-CM | POA: Diagnosis present

## 2016-11-15 DIAGNOSIS — I130 Hypertensive heart and chronic kidney disease with heart failure and stage 1 through stage 4 chronic kidney disease, or unspecified chronic kidney disease: Secondary | ICD-10-CM | POA: Diagnosis present

## 2016-11-15 DIAGNOSIS — E7800 Pure hypercholesterolemia, unspecified: Secondary | ICD-10-CM | POA: Diagnosis present

## 2016-11-15 DIAGNOSIS — Z8673 Personal history of transient ischemic attack (TIA), and cerebral infarction without residual deficits: Secondary | ICD-10-CM

## 2016-11-15 DIAGNOSIS — I509 Heart failure, unspecified: Secondary | ICD-10-CM | POA: Diagnosis present

## 2016-11-15 DIAGNOSIS — F0280 Dementia in other diseases classified elsewhere without behavioral disturbance: Secondary | ICD-10-CM | POA: Diagnosis not present

## 2016-11-15 DIAGNOSIS — K8020 Calculus of gallbladder without cholecystitis without obstruction: Secondary | ICD-10-CM | POA: Diagnosis present

## 2016-11-15 DIAGNOSIS — J690 Pneumonitis due to inhalation of food and vomit: Secondary | ICD-10-CM

## 2016-11-15 DIAGNOSIS — I252 Old myocardial infarction: Secondary | ICD-10-CM

## 2016-11-15 DIAGNOSIS — J449 Chronic obstructive pulmonary disease, unspecified: Secondary | ICD-10-CM | POA: Diagnosis present

## 2016-11-15 DIAGNOSIS — E039 Hypothyroidism, unspecified: Secondary | ICD-10-CM | POA: Diagnosis present

## 2016-11-15 DIAGNOSIS — E785 Hyperlipidemia, unspecified: Secondary | ICD-10-CM | POA: Diagnosis present

## 2016-11-15 DIAGNOSIS — F329 Major depressive disorder, single episode, unspecified: Secondary | ICD-10-CM | POA: Diagnosis present

## 2016-11-15 DIAGNOSIS — R197 Diarrhea, unspecified: Secondary | ICD-10-CM | POA: Diagnosis present

## 2016-11-15 DIAGNOSIS — K219 Gastro-esophageal reflux disease without esophagitis: Secondary | ICD-10-CM | POA: Diagnosis present

## 2016-11-15 DIAGNOSIS — I480 Paroxysmal atrial fibrillation: Secondary | ICD-10-CM | POA: Diagnosis present

## 2016-11-15 DIAGNOSIS — Z87891 Personal history of nicotine dependence: Secondary | ICD-10-CM | POA: Diagnosis not present

## 2016-11-15 DIAGNOSIS — Z7982 Long term (current) use of aspirin: Secondary | ICD-10-CM

## 2016-11-15 DIAGNOSIS — Z66 Do not resuscitate: Secondary | ICD-10-CM | POA: Diagnosis present

## 2016-11-15 DIAGNOSIS — Z79899 Other long term (current) drug therapy: Secondary | ICD-10-CM | POA: Diagnosis not present

## 2016-11-15 DIAGNOSIS — Z8546 Personal history of malignant neoplasm of prostate: Secondary | ICD-10-CM | POA: Diagnosis not present

## 2016-11-15 DIAGNOSIS — R7989 Other specified abnormal findings of blood chemistry: Secondary | ICD-10-CM

## 2016-11-15 LAB — CBC WITH DIFFERENTIAL/PLATELET
BASOS ABS: 0 10*3/uL (ref 0.0–0.1)
BASOS PCT: 0 %
EOS ABS: 0.2 10*3/uL (ref 0.0–0.7)
Eosinophils Relative: 2 %
HCT: 36.3 % — ABNORMAL LOW (ref 39.0–52.0)
Hemoglobin: 11.9 g/dL — ABNORMAL LOW (ref 13.0–17.0)
LYMPHS PCT: 8 %
Lymphs Abs: 0.6 10*3/uL — ABNORMAL LOW (ref 0.7–4.0)
MCH: 30.1 pg (ref 26.0–34.0)
MCHC: 32.8 g/dL (ref 30.0–36.0)
MCV: 91.7 fL (ref 78.0–100.0)
Monocytes Absolute: 0.6 10*3/uL (ref 0.1–1.0)
Monocytes Relative: 8 %
Neutro Abs: 6.5 10*3/uL (ref 1.7–7.7)
Neutrophils Relative %: 82 %
PLATELETS: 245 10*3/uL (ref 150–400)
RBC: 3.96 MIL/uL — AB (ref 4.22–5.81)
RDW: 15.1 % (ref 11.5–15.5)
WBC: 7.9 10*3/uL (ref 4.0–10.5)

## 2016-11-15 LAB — COMPREHENSIVE METABOLIC PANEL
ALBUMIN: 2.9 g/dL — AB (ref 3.5–5.0)
ALT: 177 U/L — AB (ref 17–63)
AST: 173 U/L — AB (ref 15–41)
Alkaline Phosphatase: 67 U/L (ref 38–126)
Anion gap: 7 (ref 5–15)
BUN: 19 mg/dL (ref 6–20)
CHLORIDE: 107 mmol/L (ref 101–111)
CO2: 24 mmol/L (ref 22–32)
CREATININE: 1.47 mg/dL — AB (ref 0.61–1.24)
Calcium: 8.1 mg/dL — ABNORMAL LOW (ref 8.9–10.3)
GFR calc Af Amer: 50 mL/min — ABNORMAL LOW (ref >60)
GFR calc non Af Amer: 43 mL/min — ABNORMAL LOW (ref >60)
GLUCOSE: 122 mg/dL — AB (ref 65–99)
POTASSIUM: 3.7 mmol/L (ref 3.5–5.1)
SODIUM: 138 mmol/L (ref 135–145)
Total Bilirubin: 0.6 mg/dL (ref 0.3–1.2)
Total Protein: 7 g/dL (ref 6.5–8.1)

## 2016-11-15 LAB — C DIFFICILE QUICK SCREEN W PCR REFLEX
C DIFFICILE (CDIFF) INTERP: NOT DETECTED
C Diff antigen: NEGATIVE
C Diff toxin: NEGATIVE

## 2016-11-15 LAB — LIPASE, BLOOD: Lipase: 24 U/L (ref 11–51)

## 2016-11-15 LAB — I-STAT CG4 LACTIC ACID, ED: LACTIC ACID, VENOUS: 1.01 mmol/L (ref 0.5–1.9)

## 2016-11-15 MED ORDER — PANTOPRAZOLE SODIUM 40 MG PO TBEC
40.0000 mg | DELAYED_RELEASE_TABLET | Freq: Every day | ORAL | Status: DC
  Administered 2016-11-16 – 2016-11-17 (×2): 40 mg via ORAL
  Filled 2016-11-15 (×2): qty 1

## 2016-11-15 MED ORDER — FERROUS SULFATE 325 (65 FE) MG PO TABS
325.0000 mg | ORAL_TABLET | Freq: Two times a day (BID) | ORAL | Status: DC
  Administered 2016-11-16 – 2016-11-17 (×3): 325 mg via ORAL
  Filled 2016-11-15 (×4): qty 1

## 2016-11-15 MED ORDER — TRAZODONE HCL 50 MG PO TABS
75.0000 mg | ORAL_TABLET | Freq: Every day | ORAL | Status: DC
  Administered 2016-11-15 – 2016-11-16 (×2): 75 mg via ORAL
  Filled 2016-11-15 (×2): qty 2

## 2016-11-15 MED ORDER — NITROGLYCERIN 0.4 MG SL SUBL: 0.4000 mg | SUBLINGUAL_TABLET | SUBLINGUAL | Status: DC | PRN

## 2016-11-15 MED ORDER — VANCOMYCIN HCL IN DEXTROSE 1-5 GM/200ML-% IV SOLN: 1000.0000 mg | INTRAVENOUS | Status: DC

## 2016-11-15 MED ORDER — METHYLPREDNISOLONE SODIUM SUCC 40 MG IJ SOLR
40.0000 mg | Freq: Two times a day (BID) | INTRAMUSCULAR | Status: DC
  Administered 2016-11-15 – 2016-11-17 (×4): 40 mg via INTRAVENOUS
  Filled 2016-11-15 (×4): qty 1

## 2016-11-15 MED ORDER — SODIUM CHLORIDE 0.9% FLUSH
3.0000 mL | Freq: Two times a day (BID) | INTRAVENOUS | Status: DC
  Administered 2016-11-15 – 2016-11-16 (×2): 3 mL via INTRAVENOUS

## 2016-11-15 MED ORDER — SODIUM CHLORIDE 0.9 % IV SOLN
INTRAVENOUS | Status: DC
  Administered 2016-11-15: 11:00:00 via INTRAVENOUS

## 2016-11-15 MED ORDER — VANCOMYCIN HCL IN DEXTROSE 1-5 GM/200ML-% IV SOLN
1000.0000 mg | Freq: Once | INTRAVENOUS | Status: AC
  Administered 2016-11-15: 1000 mg via INTRAVENOUS
  Filled 2016-11-15: qty 200

## 2016-11-15 MED ORDER — ACETAMINOPHEN 650 MG RE SUPP: 650.0000 mg | Freq: Four times a day (QID) | RECTAL | Status: DC | PRN

## 2016-11-15 MED ORDER — ADULT MULTIVITAMIN W/MINERALS CH
1.0000 | ORAL_TABLET | Freq: Every day | ORAL | Status: DC
  Administered 2016-11-16 – 2016-11-17 (×2): 1 via ORAL
  Filled 2016-11-15 (×2): qty 1

## 2016-11-15 MED ORDER — HEPARIN SODIUM (PORCINE) 5000 UNIT/ML IJ SOLN
5000.0000 [IU] | Freq: Three times a day (TID) | INTRAMUSCULAR | Status: DC
  Administered 2016-11-15 – 2016-11-17 (×5): 5000 [IU] via SUBCUTANEOUS
  Filled 2016-11-15 (×6): qty 1

## 2016-11-15 MED ORDER — PRAVASTATIN SODIUM 40 MG PO TABS
40.0000 mg | ORAL_TABLET | Freq: Every evening | ORAL | Status: DC
  Administered 2016-11-15 – 2016-11-16 (×2): 40 mg via ORAL
  Filled 2016-11-15: qty 1

## 2016-11-15 MED ORDER — AMIODARONE HCL 200 MG PO TABS
200.0000 mg | ORAL_TABLET | Freq: Every day | ORAL | Status: DC
  Administered 2016-11-16 – 2016-11-17 (×2): 200 mg via ORAL
  Filled 2016-11-15 (×2): qty 1

## 2016-11-15 MED ORDER — LEVOTHYROXINE SODIUM 75 MCG PO TABS
75.0000 ug | ORAL_TABLET | Freq: Every day | ORAL | Status: DC
  Administered 2016-11-16 – 2016-11-17 (×2): 75 ug via ORAL
  Filled 2016-11-15 (×2): qty 1

## 2016-11-15 MED ORDER — ALPRAZOLAM 0.5 MG PO TABS
0.5000 mg | ORAL_TABLET | Freq: Two times a day (BID) | ORAL | Status: DC | PRN
  Administered 2016-11-16 – 2016-11-17 (×2): 0.5 mg via ORAL
  Filled 2016-11-15 (×2): qty 1

## 2016-11-15 MED ORDER — DEXTROSE 5 % IV SOLN
2.0000 g | INTRAVENOUS | Status: DC
  Administered 2016-11-16: 2 g via INTRAVENOUS
  Filled 2016-11-15 (×2): qty 2

## 2016-11-15 MED ORDER — ACETAMINOPHEN 325 MG PO TABS: 650.0000 mg | ORAL_TABLET | Freq: Four times a day (QID) | ORAL | Status: DC | PRN

## 2016-11-15 MED ORDER — SACCHAROMYCES BOULARDII 250 MG PO CAPS
250.0000 mg | ORAL_CAPSULE | Freq: Two times a day (BID) | ORAL | Status: DC
Start: 2016-11-15 — End: 2016-11-17
  Administered 2016-11-15 – 2016-11-17 (×4): 250 mg via ORAL
  Filled 2016-11-15 (×4): qty 1

## 2016-11-15 MED ORDER — DEXTROSE 5 % IV SOLN: 2.0000 g | INTRAVENOUS | Status: DC

## 2016-11-15 MED ORDER — ENSURE ENLIVE PO LIQD
237.0000 mL | Freq: Two times a day (BID) | ORAL | Status: DC
  Administered 2016-11-16 – 2016-11-17 (×3): 237 mL via ORAL

## 2016-11-15 MED ORDER — IPRATROPIUM-ALBUTEROL 0.5-2.5 (3) MG/3ML IN SOLN
3.0000 mL | Freq: Four times a day (QID) | RESPIRATORY_TRACT | Status: DC
  Administered 2016-11-15: 3 mL via RESPIRATORY_TRACT
  Filled 2016-11-15: qty 3

## 2016-11-15 MED ORDER — SERTRALINE HCL 50 MG PO TABS
100.0000 mg | ORAL_TABLET | Freq: Every evening | ORAL | Status: DC
  Administered 2016-11-15 – 2016-11-16 (×2): 100 mg via ORAL
  Filled 2016-11-15 (×2): qty 2

## 2016-11-15 MED ORDER — SODIUM CHLORIDE 0.9 % IV SOLN
INTRAVENOUS | Status: DC
  Administered 2016-11-15 – 2016-11-16 (×3): via INTRAVENOUS

## 2016-11-15 MED ORDER — IPRATROPIUM-ALBUTEROL 0.5-2.5 (3) MG/3ML IN SOLN: 3.0000 mL | RESPIRATORY_TRACT | Status: DC | PRN

## 2016-11-15 MED ORDER — ASPIRIN 81 MG PO CHEW
81.0000 mg | CHEWABLE_TABLET | Freq: Every day | ORAL | Status: DC
  Administered 2016-11-16 – 2016-11-17 (×2): 81 mg via ORAL
  Filled 2016-11-15 (×2): qty 1

## 2016-11-15 MED ORDER — BISACODYL 5 MG PO TBEC
5.0000 mg | DELAYED_RELEASE_TABLET | Freq: Every day | ORAL | Status: DC | PRN
  Filled 2016-11-15: qty 1

## 2016-11-15 MED ORDER — CEFEPIME HCL 1 G IJ SOLR: 1.0000 g | Freq: Three times a day (TID) | INTRAMUSCULAR | Status: DC

## 2016-11-15 MED ORDER — DEXTROSE 5 % IV SOLN
2.0000 g | Freq: Once | INTRAVENOUS | Status: AC
  Administered 2016-11-15: 2 g via INTRAVENOUS
  Filled 2016-11-15: qty 2

## 2016-11-15 NOTE — ED Triage Notes (Signed)
Pt presents to [facility] via [facility] from [facility] for complaints of rectal pain secondary to diarrhea since starting Augmentin on 11-11-16. Patient believes he is on augmentin for pneumonia but he is unsure. Patient resides in memory care center at [facility]. DNR received from EMS. Will place at patient's bedside.

## 2016-11-15 NOTE — ED Notes (Signed)
Failed attempt to collect labs   

## 2016-11-15 NOTE — ED Provider Notes (Signed)
[facility] DEPT Provider Note   CSN: [id number] Arrival date & time: 11/15/16  0913     History   Chief Complaint Chief Complaint  Patient presents with  . Rectal Pain    HPI Logan Day is a 83 y.o. male.  83 year old male presents with rectal pain due to diarrhea. Rectal pain characterized as sharp and worse with defecation. Recently started Augmentin for treatment of pneumonia and has 3 days left. States diarrhea has been watery and not associated with fever, vomiting, abdominal pain. He denies any dyspnea. No chest pain discomfort. He has not had any urinary symptoms. Diarrhea has been persistent and without blood. Nothing makes it better or worse. No treatment use prior to arrival.      Past Medical History:  Diagnosis Date  . Angina   . Atrial fibrillation ([place])   . Cancer [facility])    prostate cancer  . Congestive heart failure ([place])   . COPD (chronic obstructive pulmonary disease) ([place])   . Coronary artery disease    Circ stent (2009 cath patent)  . Dementia   . GERD (gastroesophageal reflux disease)   . GI bleed 08/2015  . Hypercholesteremia   . Hyperlipidemia   . Hypertension   . MI (myocardial infarction) ([place])    x 2  . Prostate cancer ([place])   . Shortness of breath dyspnea   . TIA (transient ischemic attack)     Patient Active Problem List   Diagnosis Date Noted  . Small bowel obstruction ([place]) 09/18/2015  . UTI (lower urinary tract infection) 09/18/2015  . SBO (small bowel obstruction) ([place]) 09/18/2015  . Protein-calorie malnutrition, severe 08/27/2015  . GI bleed 08/26/2015  . Symptomatic anemia 08/26/2015  . CKD (chronic kidney disease), stage II 08/26/2015  . Physical deconditioning 08/26/2015  . Protein calorie malnutrition ([place]) 08/26/2015  . Anticoagulated   . Blood loss anemia   . Constipation   . Bleeding gastrointestinal   . Dementia with behavioral disturbance 05/19/2015  . Severe recurrent major depression without psychotic  features ([place]) 05/19/2015  . Incontinence of urine 04/23/2015  . Medicare annual wellness visit, subsequent 04/22/2015  . Anxiety and depression 01/22/2015  . On amiodarone therapy 12/11/2014  . Chronic combined systolic and diastolic congestive heart failure ([place])   . PAF (paroxysmal atrial fibrillation) ([place])   . Malnutrition of moderate degree ([place]) 11/30/2014  . CKD (chronic kidney disease) stage 2, GFR 60-89 ml/min 11/29/2014  . Hyperlipidemia 11/29/2014  . 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 Weight loss, unintentional 11/29/2014  . Hard of hearing 11/29/2014  . Essential hypertension 12/18/2013  . Other and unspecified angina pectoris 10/19/2012    Class: Acute  . Atherosclerotic heart disease of native coronary artery with angina pectoris ([place]) 10/19/2012  . Aphasia S/P CVA 07/15/2011    Past Surgical History:  Procedure Laterality Date  . CORONARY ANGIOPLASTY WITH STENT PLACEMENT  10/19/2012   DES  to circumflex  . FRACTURE SURGERY    . LAPAROTOMY N/A 09/23/2015   Procedure: EXPLORATORY LAPAROTOMY;  Surgeon: Georganna Skeans, MD;  Location: [facility];  Service: General;  Laterality: N/A;  . LYSIS OF ADHESION N/A 09/23/2015   Procedure: LYSIS OF ADHESION ;  Surgeon: Georganna Skeans, MD;  Location: [facility];  Service: General;  Laterality: N/A;  . PERCUTANEOUS CORONARY STENT INTERVENTION (PCI-S) N/A 10/19/2012   Procedure: PERCUTANEOUS CORONARY STENT INTERVENTION (PCI-S);  Surgeon: Sinclair Grooms, MD;  Location: [facility] C[facility] LAB;  Service: Cardiovascular;  Laterality:  N/A;  . prostayectomy         Home Medications    Prior to Admission medications   Medication Sig Start Date End Date Taking? Authorizing Provider  amoxicillin-clavulanate (AUGMENTIN) 875-125 MG tablet Take 1 tablet by mouth 2 (two) times daily.   Yes Historical Provider, MD  acetaminophen (TYLENOL) 500 MG tablet Take 500 mg by mouth 2 (two) times daily.    Historical Provider, MD  albuterol  (PROVENTIL HFA;VENTOLIN HFA) 108 (90 Base) MCG/ACT inhaler Inhale 1-2 puffs into the lungs every 6 (six) hours as needed for wheezing or shortness of breath.    Historical Provider, MD  albuterol (PROVENTIL) (2.5 MG/3ML) 0.083% nebulizer solution Take 3 mLs (2.5 mg total) by nebulization every 6 (six) hours as needed for wheezing or shortness of breath. 10/28/15   Everlene Balls, MD  ALPRAZolam Duanne Moron) 0.5 MG tablet Take 1 tablet (0.5 mg total) by mouth 2 (two) times daily as needed for anxiety. 09/29/15   Maryann Mikhail, DO  amiodarone (PACERONE) 200 MG tablet Take 1 tablet (200 mg total) by mouth daily. 01/22/15   Belva Crome, MD  aspirin EC 81 MG tablet Take 1 tablet (81 mg total) by mouth daily. Resume on 09/11/15 09/11/15   Jonetta Osgood, MD  bisacodyl (DULCOLAX) 5 MG EC tablet Take 5 mg by mouth daily as needed for moderate constipation.    Historical Provider, MD  buPROPion (WELLBUTRIN SR) 150 MG 12 hr tablet Take 150 mg by mouth 2 (two) times daily.  08/01/14   Historical Provider, MD  doxycycline (VIBRAMYCIN) 100 MG capsule Take 1 capsule (100 mg total) by mouth 2 (two) times daily. 11/13/15   Leo Grosser, MD  ENSURE (ENSURE) Take 237 mLs by mouth 3 (three) times daily.    Historical Provider, MD  ferrous sulfate 325 (65 FE) MG tablet Take 1 tablet (325 mg total) by mouth 2 (two) times daily with a meal. 08/28/15   Shanker Kristeen Mans, MD  levothyroxine (SYNTHROID, LEVOTHROID) 25 MCG tablet Take 25 mcg by mouth daily.    Historical Provider, MD  Multiple Vitamin (DAILY VITE) TABS Take 1 tablet by mouth daily.    Historical Provider, MD  nitroGLYCERIN (NITROSTAT) 0.4 MG SL tablet Place 0.4 mg under the tongue every 5 (five) minutes as needed for chest pain.    Historical Provider, MD  oxyCODONE (ROXICODONE) 5 MG/5ML solution Take 5 mg by mouth every 4 (four) hours as needed for severe pain.    Historical Provider, MD  pantoprazole (PROTONIX) 40 MG tablet Take 1 tablet (40 mg total) by mouth daily at  12 noon. 08/28/15   Shanker Kristeen Mans, MD  pravastatin (PRAVACHOL) 40 MG tablet Take 40 mg by mouth every evening.    Historical Provider, MD  sertraline (ZOLOFT) 100 MG tablet Take 100 mg by mouth every evening.    Historical Provider, MD  traMADol (ULTRAM) 50 MG tablet Take 50 mg by mouth every 6 (six) hours as needed for moderate pain.    Historical Provider, MD  traZODone (DESYREL) 50 MG tablet Take 50 mg by mouth at bedtime.    Historical Provider, MD    Family History Family History  Problem Relation Age of Onset  . Heart disease Mother   . Heart disease Father   . Coronary artery disease Son     Social History Social History  Substance Use Topics  . Smoking status: Former Smoker    Packs/day: 1.00    Years: 40.00  .  Smokeless tobacco: Former Systems developer    Types: Chew     Comment: quit smoking 2002  . Alcohol use No     Allergies   Patient has no known allergies.   Review of Systems Review of Systems  All other systems reviewed and are negative.    Physical Exam Updated Vital Signs SpO2 (!) 89%   Physical Exam  Constitutional: He is oriented to person, place, and time. He appears well-developed and well-nourished.  Non-toxic appearance. No distress.  HENT:  Head: Normocephalic and atraumatic.  Eyes: Conjunctivae, EOM and lids are normal. Pupils are equal, round, and reactive to light.  Neck: Normal range of motion. Neck supple. No tracheal deviation present. No thyroid mass present.  Cardiovascular: Normal rate, regular rhythm and normal heart sounds.  Exam reveals no gallop.   No murmur heard. Pulmonary/Chest: Effort normal and breath sounds normal. No stridor. No respiratory distress. He has no decreased breath sounds. He has no wheezes. He has no rhonchi. He has no rales.  Abdominal: Soft. Normal appearance and bowel sounds are normal. He exhibits no distension. There is no tenderness. There is no rebound and no CVA tenderness.  Genitourinary: Rectal exam shows  external hemorrhoid and tenderness.  Musculoskeletal: Normal range of motion. He exhibits no edema or tenderness.  Neurological: He is alert and oriented to person, place, and time. He has normal strength. No cranial nerve deficit or sensory deficit. GCS eye subscore is 4. GCS verbal subscore is 5. GCS motor subscore is 6.  Skin: Skin is warm and dry. No abrasion and no rash noted.  Psychiatric: He has a normal mood and affect. His speech is normal and behavior is normal.  Nursing note and vitals reviewed.    ED Treatments / Results  Labs (all labs ordered are listed, but only abnormal results are displayed) Labs Reviewed  GASTROINTESTINAL PANEL BY PCR, STOOL (REPLACES STOOL CULTURE)  C DIFFICILE QUICK SCREEN W PCR REFLEX  CBC WITH DIFFERENTIAL/PLATELET  COMPREHENSIVE METABOLIC PANEL  LIPASE, BLOOD    EKG  EKG Interpretation None       Radiology No results found.  Procedures Procedures (including critical care time)  Medications Ordered in ED Medications  0.9 %  sodium chloride infusion (not administered)     Initial Impression / Assessment and Plan / ED Course  I have reviewed the triage vital signs and the nursing notes.  Pertinent labs & imaging results that were available during my care of the patient were reviewed by me and considered in my medical decision making (see chart for details).    C. difficile is pending at this time. Patient has oxygen requirement at this time. Repeat chest x-ray shows worsening pneumonia. Started on antibiotics and is resting comfortably. Will be admitted to the hospitalist service  Final Clinical Impressions(s) / ED Diagnoses   Final diagnoses:  SOB (shortness of breath)    New Prescriptions New Prescriptions   No medications on file     Lacretia Leigh, MD 11/15/16 1519

## 2016-11-15 NOTE — H&P (Signed)
History and Physical    Logan Day VPX:[id number] DOB: 05/09/1933 DOA: 11/15/2016  PCP: Pcp Not In System  Patient coming from: [facility]  Chief Complaint: Diarrhea  HPI: Logan Day is a 83 y.o. male with medical history significant of dementia, atrial fibrillation, congestive heart failure, COPD, CAD, hypertension, hyperlipidemia who presents emerged department with complaints of diarrhea and rectal pain after starting Augmentin on 11/11/2016 for worsening pneumonia. Patient was reportedly given other oral antibiotics prior to starting Augmentin with limited efficacy.   ED Course: In the emerged department, patient was found to be hypoxic with O2 sats in the upper 80s. Patient is baseline O2 nave. Chest x-ray is notable for bilateral airspace disease, right worse than left. Patient was started on empiric cefepime and vancomycin. C. difficile studies were ordered, currently pending. Hospitalist service consulted for consideration for admission  Review of Systems:  Review of Systems  Constitutional: Positive for malaise/fatigue. Negative for chills, fever and weight loss.  HENT: Negative for congestion, ear discharge, ear pain and nosebleeds.   Eyes: Negative for double vision, photophobia and pain.  Respiratory: Positive for cough, shortness of breath and wheezing.   Cardiovascular: Negative for palpitations, orthopnea and claudication.  Gastrointestinal: Positive for diarrhea. Negative for nausea and vomiting.  Genitourinary: Negative for frequency, hematuria and urgency.  Musculoskeletal: Negative for back pain, joint pain and neck pain.  Neurological: Negative for tingling, tremors, seizures and loss of consciousness.  Psychiatric/Behavioral: Negative for hallucinations. The patient is not nervous/anxious and does not have insomnia.     Past Medical History:  Diagnosis Date  . Angina   . Atrial fibrillation ([place])   . Cancer [facility])    prostate cancer  .  Congestive heart failure ([place])   . COPD (chronic obstructive pulmonary disease) ([place])   . Coronary artery disease    Circ stent (2009 cath patent)  . Dementia   . GERD (gastroesophageal reflux disease)   . GI bleed 08/2015  . Hypercholesteremia   . Hyperlipidemia   . Hypertension   . MI (myocardial infarction) ([place])    x 2  . Prostate cancer ([place])   . Shortness of breath dyspnea   . TIA (transient ischemic attack)     Past Surgical History:  Procedure Laterality Date  . CORONARY ANGIOPLASTY WITH STENT PLACEMENT  10/19/2012   DES  to circumflex  . FRACTURE SURGERY    . LAPAROTOMY N/A 09/23/2015   Procedure: EXPLORATORY LAPAROTOMY;  Surgeon: Georganna Skeans, MD;  Location: [facility];  Service: General;  Laterality: N/A;  . LYSIS OF ADHESION N/A 09/23/2015   Procedure: LYSIS OF ADHESION ;  Surgeon: Georganna Skeans, MD;  Location: [facility];  Service: General;  Laterality: N/A;  . PERCUTANEOUS CORONARY STENT INTERVENTION (PCI-S) N/A 10/19/2012   Procedure: PERCUTANEOUS CORONARY STENT INTERVENTION (PCI-S);  Surgeon: Sinclair Grooms, MD;  Location: [facility] C[facility] LAB;  Service: Cardiovascular;  Laterality: N/A;  . prostayectomy       reports that he has quit smoking. He has a 40.00 pack-year smoking history. He has quit using smokeless tobacco. His smokeless tobacco use included Chew. He reports that he does not drink alcohol or use drugs.  No Known Allergies  Family History  Problem Relation Age of Onset  . Heart disease Mother   . Heart disease Father   . Coronary artery disease Son     Prior to Admission medications   Medication Sig Start Date End Date Taking? Authorizing Provider  acetaminophen (TYLENOL) 500  MG tablet Take 500 mg by mouth 3 (three) times daily as needed for mild pain.    Yes Historical Provider, MD  albuterol (PROVENTIL HFA;VENTOLIN HFA) 108 (90 Base) MCG/ACT inhaler Inhale 1-2 puffs into the lungs every 6 (six) hours as needed for wheezing or shortness of breath.   Yes  Historical Provider, MD  ALPRAZolam Duanne Moron) 0.5 MG tablet Take 1 tablet (0.5 mg total) by mouth 2 (two) times daily as needed for anxiety. 09/29/15  Yes Maryann Mikhail, DO  amiodarone (PACERONE) 200 MG tablet Take 1 tablet (200 mg total) by mouth daily. 01/22/15  Yes Belva Crome, MD  amoxicillin-clavulanate (AUGMENTIN) 875-125 MG tablet Take 1 tablet by mouth 2 (two) times daily.   Yes Historical Provider, MD  aspirin 81 MG chewable tablet Chew 81 mg by mouth daily.   Yes Historical Provider, MD  bisacodyl (DULCOLAX) 5 MG EC tablet Take 5 mg by mouth daily as needed for moderate constipation.   Yes Historical Provider, MD  cetirizine (ZYRTEC) 5 MG tablet Take 5 mg by mouth at bedtime.   Yes Historical Provider, MD  ENSURE (ENSURE) Take 237 mLs by mouth 3 (three) times daily.   Yes Historical Provider, MD  ferrous sulfate 325 (65 FE) MG tablet Take 1 tablet (325 mg total) by mouth 2 (two) times daily with a meal. 08/28/15  Yes Shanker Kristeen Mans, MD  ibuprofen (ADVIL,MOTRIN) 200 MG tablet Take 400 mg by mouth 3 (three) times daily as needed (Pain).   Yes Historical Provider, MD  Lactobacillus (ACIDOPHILUS PO) Take 1 capsule by mouth 2 (two) times daily.   Yes Historical Provider, MD  levothyroxine (SYNTHROID, LEVOTHROID) 75 MCG tablet Take 75 mcg by mouth daily before breakfast.   Yes Historical Provider, MD  Multiple Vitamin (DAILY VITE) TABS Take 1 tablet by mouth daily.   Yes Historical Provider, MD  nitroGLYCERIN (NITROSTAT) 0.4 MG SL tablet Place 0.4 mg under the tongue every 5 (five) minutes as needed for chest pain.   Yes Historical Provider, MD  pantoprazole (PROTONIX) 40 MG tablet Take 1 tablet (40 mg total) by mouth daily at 12 noon. 08/28/15  Yes Shanker Kristeen Mans, MD  pravastatin (PRAVACHOL) 40 MG tablet Take 40 mg by mouth every evening.   Yes Historical Provider, MD  sertraline (ZOLOFT) 100 MG tablet Take 100 mg by mouth every evening.   Yes Historical Provider, MD  traZODone (DESYREL) 50  MG tablet Take 75 mg by mouth at bedtime.    Yes Historical Provider, MD  albuterol (PROVENTIL) (2.5 MG/3ML) 0.083% nebulizer solution Take 3 mLs (2.5 mg total) by nebulization every 6 (six) hours as needed for wheezing or shortness of breath. 10/28/15   Everlene Balls, MD  aspirin EC 81 MG tablet Take 1 tablet (81 mg total) by mouth daily. Resume on 09/11/15 Patient not taking: Reported on 11/15/2016 09/11/15   Jonetta Osgood, MD  doxycycline (VIBRAMYCIN) 100 MG capsule Take 1 capsule (100 mg total) by mouth 2 (two) times daily. Patient not taking: Reported on 11/15/2016 11/13/15   Leo Grosser, MD    Physical Exam: Vitals:   11/15/16 1203 11/15/16 1230 11/15/16 1300 11/15/16 1501  BP: 125/67 135/63 (!) 149/65 140/65  Pulse: (!) 53 (!) 56 (!) 49 65  Resp: 12 10 15 16   SpO2: 96% 97% 97% 95%    Constitutional: NAD, calm, comfortable Vitals:   11/15/16 1203 11/15/16 1230 11/15/16 1300 11/15/16 1501  BP: 125/67 135/63 (!) 149/65 140/65  Pulse: (!) 53 (!)  56 (!) 49 65  Resp: 12 10 15 16   SpO2: 96% 97% 97% 95%   Eyes: PERRL, lids and conjunctivae normal ENMT: Mucous membranes are moist. Posterior pharynx clear of any exudate or lesions.Normal dentition.  Neck: normal, supple, no masses, no thyromegaly Respiratory: clear to auscultation bilaterally, no wheezing, no crackles. Normal respiratory effort. No accessory muscle use.  Cardiovascular: Regular rate and rhythm, no murmurs / rubs / gallops. No extremity edema. 2+ pedal pulses. No carotid bruits.  Abdomen: no tenderness, no masses palpated. No hepatosplenomegaly. Bowel sounds positive.  Musculoskeletal: no clubbing / cyanosis. No joint deformity upper and lower extremities. Good ROM, no contractures. Normal muscle tone.  Skin: no rashes, lesions, ulcers. No induration Neurologic: CN 2-12 grossly intact. Sensation intact, DTR normal. Strength 5/5 in all 4.  Psychiatric: Normal judgment and insight. Alert and oriented x 3. Normal mood.     Labs on Admission: I have personally reviewed following labs and imaging studies  CBC:  Recent Labs Lab 11/15/16 1052  WBC 7.9  NEUTROABS 6.5  HGB 11.9*  HCT 36.3*  MCV 91.7  PLT 696   Basic Metabolic Panel:  Recent Labs Lab 11/15/16 1052  NA 138  K 3.7  CL 107  CO2 24  GLUCOSE 122*  BUN 19  CREATININE 1.47*  CALCIUM 8.1*   GFR: CrCl cannot be calculated (Unknown ideal weight.). Liver Function Tests:  Recent Labs Lab 11/15/16 1052  AST 173*  ALT 177*  ALKPHOS 67  BILITOT 0.6  PROT 7.0  ALBUMIN 2.9*    Recent Labs Lab 11/15/16 1052  LIPASE 24   No results for input(s): AMMONIA in the last 168 hours. Coagulation Profile: No results for input(s): INR, PROTIME in the last 168 hours. Cardiac Enzymes: No results for input(s): CKTOTAL, CKMB, CKMBINDEX, TROPONINI in the last 168 hours. BNP (last 3 results) No results for input(s): PROBNP in the last 8760 hours. HbA1C: No results for input(s): HGBA1C in the last 72 hours. CBG: No results for input(s): GLUCAP in the last 168 hours. Lipid Profile: No results for input(s): CHOL, HDL, LDLCALC, TRIG, CHOLHDL, LDLDIRECT in the last 72 hours. Thyroid Function Tests: No results for input(s): TSH, T4TOTAL, FREET4, T3FREE, THYROIDAB in the last 72 hours. Anemia Panel: No results for input(s): VITAMINB12, FOLATE, FERRITIN, TIBC, IRON, RETICCTPCT in the last 72 hours. Urine analysis:    Component Value Date/Time   COLORURINE YELLOW 09/18/2015 1856   APPEARANCEUR TURBID (A) 09/18/2015 1856   LABSPEC 1.012 09/18/2015 1856   PHURINE 8.0 09/18/2015 1856   GLUCOSEU NEGATIVE 09/18/2015 1856   HGBUR NEGATIVE 09/18/2015 1856   BILIRUBINUR NEGATIVE 09/18/2015 1856   BILIRUBINUR 2+ 06/10/2015 1444   KETONESUR 15 (A) 09/18/2015 1856   PROTEINUR NEGATIVE 09/18/2015 1856   UROBILINOGEN 2.0 06/10/2015 1444   UROBILINOGEN 1.0 05/18/2015 1641   NITRITE POSITIVE (A) 09/18/2015 1856   LEUKOCYTESUR TRACE (A) 09/18/2015  1856   Sepsis Labs: !!!!!!!!!!!!!!!!!!!!!!!!!!!!!!!!!!!!!!!!!!!! @LABRCNTIP (procalcitonin:4,lacticidven:4) )No results found for this or any previous visit (from the past 240 hour(s)).   Radiological Exams on Admission: Dg Chest 2 View  Result Date: 11/15/2016 CLINICAL DATA:  Cough and congestion for 1-2 weeks. EXAM: CHEST  2 VIEW COMPARISON:  PA and lateral chest 08/26/2015 and 11/13/2015. CT chest 11/30/2014 FINDINGS: The lungs are emphysematous. There is patchy bilateral lower lobe airspace disease, much worse on the right. No pneumothorax or pleural effusion. Heart size normal. Aortic atherosclerosis is noted. No acute bony abnormality. IMPRESSION: Right much worse than left lower  lobe airspace disease worrisome for pneumonia. Recommend followup to clearing. Pulmonary hyperexpansion suggestive of emphysema. Electronically Signed   By: Inge Rise M.D.   On: 11/15/2016 10:07    EKG: Independently reviewed. Most recent EKG from 09/21/2015 with sinus rhythm noted  Assessment/Plan Principal Problem:   Aspiration pneumonia (HCC) Active Problems:   Essential hypertension   CKD (chronic kidney disease) stage 2, GFR 60-89 ml/min   Hyperlipidemia   Dementia due to another medical condition   PAF (paroxysmal atrial fibrillation) ([place])   1. Pneumonia, possible aspiration pneumonia 1. Bilateral pneumonia noted 2. Continue cefepime and vancomycin 3. We'll check MRSA screen 4. Continue O2 as tolerated, 5. Check BMP and CBC in morning 2. Hypertension 1. Blood pressure currently stable 2. Take home regimen 3. CK D stage II 1. Renal function stable, baseline 4. Hyperlipidemia 1. Currently stable 5. Dementia 1. Currently stable 6. Paroxysmal atrial fibrillation 1. Not on therapeutic anticoagulation secondary to history of GI bleeding  DVT prophylaxis: Heparin subcutaneous  Code Status: Full code, presumed Family Communication: Patient in room  Disposition Plan: Uncertain at this  time  Consults called:  Admission status: The patient has will likely require greater than 2 minutes today   Rodolph Hagemann, Orpah Melter MD [facility] Pager [phone]  If 7PM-7AM, please contact night-coverage [email or web address] Password [email or web address]  11/15/2016, 3:41 PM

## 2016-11-15 NOTE — Plan of Care (Signed)
RN paged that pt had a goldenrod form DNR with him from the nursing home. No family present at time of admission, so pt was made a full code. Pt has a hx of dementia and is presently unable to speak for himself. NP called pt's son about code status. Son confirmed that pt is a DNR to be applied in the hospital as well. Order changed. KJKG, NP [facility]

## 2016-11-15 NOTE — Progress Notes (Signed)
Pt. Arrived on unit with DNR form. Is a full code in the system, on call NP Baltazar Najjar paged and made aware so she can changed it in the system

## 2016-11-15 NOTE — Progress Notes (Signed)
Pharmacy Antibiotic Note  Logan Day is a 83 y.o. male admitted on 11/15/2016 with HCAP.  Pharmacy has been consulted for vancomycin and cefepime dosing. No current weight, past weight of 71 kg on 09/29/15. WBC WNL, Creat 1.47. Normalized creat cl 40 ml/min.  CXR: Right much worse than left lower lobe airspace disease worrisome for pneumonia Plan:   Vancomycin 1000 mg IV every 24 hours.  Goal trough 15-20 mcg/mL. Cefepime 2 gm q24 hr f/u renal funtion, current weight, WBC, temp, culture data, CXR Vancomycin levels as needed   No data recorded.   Recent Labs Lab 11/15/16 1052  WBC 7.9  CREATININE 1.47*    CrCl cannot be calculated (Unknown ideal weight.).    No Known Allergies  Antimicrobials this admission: vanc 4/16>> Cefepime 4/16>> Dose adjustments this admission:   Microbiology results:   Thank you for allowing pharmacy to be a part of this patient's care.  Eudelia Bunch, Pharm.D. [phone] 11/15/2016 12:05 PM

## 2016-11-15 NOTE — ED Notes (Signed)
Bed: IP18 Expected date:  Expected time:  Means of arrival:  Comments: EMS 83 yo

## 2016-11-16 DIAGNOSIS — J189 Pneumonia, unspecified organism: Secondary | ICD-10-CM

## 2016-11-16 DIAGNOSIS — F0280 Dementia in other diseases classified elsewhere without behavioral disturbance: Secondary | ICD-10-CM

## 2016-11-16 DIAGNOSIS — R0602 Shortness of breath: Secondary | ICD-10-CM

## 2016-11-16 DIAGNOSIS — E785 Hyperlipidemia, unspecified: Secondary | ICD-10-CM

## 2016-11-16 LAB — CBC
HCT: 36.5 % — ABNORMAL LOW (ref 39.0–52.0)
HEMOGLOBIN: 12 g/dL — AB (ref 13.0–17.0)
MCH: 30.3 pg (ref 26.0–34.0)
MCHC: 32.9 g/dL (ref 30.0–36.0)
MCV: 92.2 fL (ref 78.0–100.0)
PLATELETS: 259 10*3/uL (ref 150–400)
RBC: 3.96 MIL/uL — ABNORMAL LOW (ref 4.22–5.81)
RDW: 15.2 % (ref 11.5–15.5)
WBC: 7.6 10*3/uL (ref 4.0–10.5)

## 2016-11-16 LAB — COMPREHENSIVE METABOLIC PANEL
ALBUMIN: 2.8 g/dL — AB (ref 3.5–5.0)
ALK PHOS: 64 U/L (ref 38–126)
ALT: 183 U/L — AB (ref 17–63)
AST: 178 U/L — ABNORMAL HIGH (ref 15–41)
Anion gap: 8 (ref 5–15)
BUN: 22 mg/dL — ABNORMAL HIGH (ref 6–20)
CALCIUM: 8.4 mg/dL — AB (ref 8.9–10.3)
CO2: 23 mmol/L (ref 22–32)
CREATININE: 1.41 mg/dL — AB (ref 0.61–1.24)
Chloride: 108 mmol/L (ref 101–111)
GFR calc Af Amer: 52 mL/min — ABNORMAL LOW (ref >60)
GFR calc non Af Amer: 45 mL/min — ABNORMAL LOW (ref >60)
GLUCOSE: 149 mg/dL — AB (ref 65–99)
Potassium: 5.1 mmol/L (ref 3.5–5.1)
SODIUM: 139 mmol/L (ref 135–145)
Total Bilirubin: 0.6 mg/dL (ref 0.3–1.2)
Total Protein: 6.9 g/dL (ref 6.5–8.1)

## 2016-11-16 LAB — GASTROINTESTINAL PANEL BY PCR, STOOL (REPLACES STOOL CULTURE)

## 2016-11-16 LAB — EXPECTORATED SPUTUM ASSESSMENT W GRAM STAIN, RFLX TO RESP C

## 2016-11-16 LAB — HIV ANTIBODY (ROUTINE TESTING W REFLEX): HIV Screen 4th Generation wRfx: NONREACTIVE

## 2016-11-16 LAB — STREP PNEUMONIAE URINARY ANTIGEN: Strep Pneumo Urinary Antigen: NEGATIVE

## 2016-11-16 LAB — EXPECTORATED SPUTUM ASSESSMENT W REFEX TO RESP CULTURE

## 2016-11-16 LAB — MRSA PCR SCREENING: MRSA BY PCR: NEGATIVE

## 2016-11-16 MED ORDER — IPRATROPIUM-ALBUTEROL 0.5-2.5 (3) MG/3ML IN SOLN
3.0000 mL | Freq: Three times a day (TID) | RESPIRATORY_TRACT | Status: DC
  Administered 2016-11-16 – 2016-11-17 (×3): 3 mL via RESPIRATORY_TRACT
  Filled 2016-11-16 (×4): qty 3

## 2016-11-16 MED ORDER — IPRATROPIUM-ALBUTEROL 0.5-2.5 (3) MG/3ML IN SOLN
3.0000 mL | Freq: Four times a day (QID) | RESPIRATORY_TRACT | Status: DC
  Administered 2016-11-16: 3 mL via RESPIRATORY_TRACT
  Filled 2016-11-16: qty 3

## 2016-11-16 NOTE — Evaluation (Addendum)
Clinical/Bedside Swallow Evaluation Patient Details  Name: Logan Day MRN: [id number] Date of Birth: [birth year]  Today's Date: 11/16/2016 Time: SLP Start Time (ACUTE ONLY): 29 SLP Stop Time (ACUTE ONLY): 1126 SLP Time Calculation (min) (ACUTE ONLY): 21 min  Past Medical History:  Past Medical History:  Diagnosis Date  . Angina   . Atrial fibrillation ([place])   . Cancer [facility])    prostate cancer  . Congestive heart failure ([place])   . COPD (chronic obstructive pulmonary disease) ([place])   . Coronary artery disease    Circ stent (2009 cath patent)  . Dementia   . GERD (gastroesophageal reflux disease)   . GI bleed 08/2015  . Hypercholesteremia   . Hyperlipidemia   . Hypertension   . MI (myocardial infarction) ([place])    x 2  . Prostate cancer ([place])   . Shortness of breath dyspnea   . TIA (transient ischemic attack)    Past Surgical History:  Past Surgical History:  Procedure Laterality Date  . CORONARY ANGIOPLASTY WITH STENT PLACEMENT  10/19/2012   DES  to circumflex  . FRACTURE SURGERY    . LAPAROTOMY N/A 09/23/2015   Procedure: EXPLORATORY LAPAROTOMY;  Surgeon: Georganna Skeans, MD;  Location: [place];  Service: General;  Laterality: N/A;  . LYSIS OF ADHESION N/A 09/23/2015   Procedure: LYSIS OF ADHESION ;  Surgeon: Georganna Skeans, MD;  Location: [place];  Service: General;  Laterality: N/A;  . PERCUTANEOUS CORONARY STENT INTERVENTION (PCI-S) N/A 10/19/2012   Procedure: PERCUTANEOUS CORONARY STENT INTERVENTION (PCI-S);  Surgeon: Sinclair Grooms, MD;  Location: [facility] CATH LAB;  Service: Cardiovascular;  Laterality: N/A;  . prostayectomy     HPI:  83 yo male adm to [facility] with aspiration pna- per review of ED note, pt gagging on secretions at home that prompted him to vomit.  PMH + for COPD, dementia, heart failure, TIA, CHF, GERD, SBO, GI bleed.  Pt CXR showed possible pna - right more than left pna and atelectasis- on ABX.  Swallow eval ordered - pt denies h/o dysphagia stating he only  has problems with "demonstrated hock' expectorating secretions".     Assessment / Plan / Recommendation Clinical Impression  Pt presents with functional oropharyngeal swallow ability with no indications of airway compromise with po.  Swallow was timely with clear voice throughout.  Pt able to self feed and demonstrated minimal increased work of breathing post-swallow.    As pt with vomitinig prior to admission, suspect this may be contributing factor to possible pneumonia.  Pt also with h/o reflux dx but denies this currently.  He did have difficulty experssing himself with lack of content words - however therefore uncertain to level of understanding.  Pt is impulsive and takes large boluses - will follow up x1 to assure tolerance, education and attempt to catch pt during a meal.    Of note, deante blough arrived after session and SLP educated him to findings/precautions using teach back.  Son reports pt with frequent belching and expectoration of secretions, causing SLP to question esophageal /GERD issues?  SLP Visit Diagnosis: Dysphagia, unspecified (R13.10)    Aspiration Risk  Mild aspiration risk    Diet Recommendation Regular;Thin liquid   Liquid Administration via: Cup;Straw Medication Administration: Whole meds with liquid Supervision: Patient able to self feed Compensations: Small sips/bites;Slow rate (start meals with liquids) Postural Changes: Seated upright at 90 degrees;Remain upright for at least 30 minutes after po intake    Other  Recommendations Oral Care  Recommendations: Oral care BID   Follow up Recommendations        Frequency and Duration min 1 x/week  1 week       Prognosis Prognosis for Safe Diet Advancement: Fair Barriers to Reach Goals: Cognitive deficits      Swallow Study   General Date of Onset: 11/16/16 HPI: 83 yo male adm to [facility] with aspiration pna- per review of ED note, pt gagging on secretions at home that prompted him to vomit.  PMH + for COPD,  dementia, heart failure, TIA, CHF, GERD, SBO, GI bleed.  Pt CXR showed possible pna - right more than left pna and atelectasis- on ABX.  Swallow eval ordered - pt denies h/o dysphagia stating he only has problems with "demonstrated hock' expectorating secretions".   Type of Study: Bedside Swallow Evaluation Diet Prior to this Study: Regular;Thin liquids Temperature Spikes Noted: No Respiratory Status: Room air History of Recent Intubation: No Behavior/Cognition: Alert;Cooperative;Confused (pt has dementia) Oral Cavity Assessment: Within Functional Limits Oral Care Completed by SLP: No Oral Cavity - Dentition: Adequate natural dentition Vision: Functional for self-feeding Self-Feeding Abilities: Able to feed self Patient Positioning: Upright in chair Baseline Vocal Quality: Normal Volitional Cough: Strong Volitional Swallow: Able to elicit    Oral/Motor/Sensory Function Overall Oral Motor/Sensory Function: Within functional limits   Ice Chips Ice chips: Not tested   Thin Liquid Thin Liquid: Within functional limits Presentation: Cup;Self Fed;Straw    Nectar Thick Nectar Thick Liquid: Not tested   Honey Thick Honey Thick Liquid: Not tested   Puree Puree: Within functional limits Presentation: Self Fed;Spoon   Solid   GO   Solid: Within functional limits Presentation: Self Fed        11/16/2016,11:41 AM  Luanna Salk, [place] [employer] [facility] SLP [phone]

## 2016-11-16 NOTE — Clinical Social Work Note (Signed)
Clinical Social Work Assessment  Patient Details  Name: Logan Day MRN: [id number] Date of Birth: 1933/06/25  Date of referral:  11/16/16               Reason for consult:  Facility Placement                Permission sought to share information with:  Facility Art therapist granted to share information::  Yes, Verbal Permission Granted  Name::        Agency::     Relationship::     Contact Information:     Housing/Transportation Living arrangements for the past 2 months:  [place] of Information:  Adult Children Patient Interpreter Needed:  None Criminal Activity/Legal Involvement Pertinent to Current Situation/Hospitalization:    Significant Relationships:  Adult Children Lives with:  Facility Resident Do you feel safe going back to the place where you live?  Yes Need for family participation in patient care:  Yes (Comment)  Care giving concerns:  None listed by pt/family    Social Worker assessment / plan:  *CSW met with ptwho asked CSW to speak with his [name] and confirmed pt's plan to be discharged back to [facility]F at [facility] to live at discharge.  CSW provided active listening and validated pt's son's concerns.  Pt has been living at [facility]F for one year, prior to being admitted to [facility].   Employment status:  Retired Nurse, adult PT Recommendations:  Home with [place] / Referral to community resources:     Patient/Family's Response to care:  Patient not alert and oriented.  Patient's [name] agreeable to plan.  Pt's son supportive and strongly involved in pt.'s care.  Pt.'s son pleasant and appreciated CSW intervention.     Patient/Family's Understanding of and Emotional Response to Diagnosis, Current Treatment, and Prognosis:  Still assessing   Emotional Assessment Appearance:  Appears stated  age Attitude/Demeanor/Rapport:    Affect (typically observed):  Accepting, Adaptable, Calm, Pleasant Orientation:  Fluctuating Orientation (Suspected and/or reported Sundowners) Alcohol / Substance use:    Psych involvement (Current and /or in the community):     Discharge Needs  Concerns to be addressed:  No discharge needs identified Readmission within the last 30 days:  No Current discharge risk:  None Barriers to Discharge:  No Barriers Identified   Claudine Mouton, LCSWA 11/16/2016, 10:35 PM

## 2016-11-16 NOTE — Evaluation (Signed)
Occupational Therapy Evaluation Patient Details Name: Logan Day MRN: [id number] DOB: 05/18/1933 Today's Date: 11/16/2016    History of Present Illness Logan Day is a 83 y.o. male with medical history significant of dementia, atrial fibrillation, congestive heart failure, COPD, CAD, hypertension, hyperlipidemia who presented to ED with complaints of diarrhea after starting Augmentin on 11/11/2016 for worsening pneumonia   Clinical Impression   Pt was admitted for the above.  Unable to verify PLOF. Pt reports he was independent.  He currently needs set up or min guard to retrieve clothes, and min guard for toilet transfers. He will benefit from continued OT in acute setting to increase safety and independence with adls.  Goals in acute setting are for supervision to mod I.    Follow Up Recommendations  Home health OT, supervision for mobility related to adls especially gathering clothes and showering   Equipment Recommendations  None recommended by OT    Recommendations for Other Services       Precautions / Restrictions Precautions Precautions: Fall Restrictions Weight Bearing Restrictions: No      Mobility Bed Mobility               General bed mobility comments: oob  Transfers Overall transfer level: Needs assistance Equipment used: 1 person hand held assist Transfers: Sit to/from Stand Sit to Stand: Min guard;Supervision         General transfer comment: min guard for first stand then supervision    Balance Overall balance assessment: Needs assistance   Sitting balance-Leahy Scale: Good     Standing balance support: No upper extremity supported Standing balance-Leahy Scale: Good                           ADL either performed or assessed with clinical judgement   ADL Overall ADL's : Needs assistance/impaired     Grooming: Wash/dry hands;Supervision/safety;Standing   Upper Body Bathing: Set up;Sitting   Lower Body  Bathing: Set up;Supervison/ safety;Sit to/from stand   Upper Body Dressing : Set up;Sitting   Lower Body Dressing: Supervision/safety;Set up;Sit to/from stand   Toilet Transfer: Min guard;Ambulation   Toileting- Clothing Manipulation and Hygiene: Supervision/safety;Sit to/from stand         General ADL Comments: min guard for safety ambulating to bathroom. Did not use AD.  Pt states he doesn't use anything at home.  Unsteady but no LOB     Vision         Perception     Praxis      Pertinent Vitals/Pain Pain Assessment: No/denies pain     Hand Dominance Right   Extremity/Trunk Assessment Upper Extremity Assessment Upper Extremity Assessment: Overall WFL for tasks assessed          Communication Communication Communication: HOH   Cognition Arousal/Alertness: Awake/alert Behavior During Therapy: WFL for tasks assessed/performed Overall Cognitive Status: History of cognitive impairments - at baseline Area of Impairment: Safety/judgement                     Memory: Decreased short-term memory   Safety/Judgement: Decreased awareness of safety     General Comments: baseline dementia, mildly impulsive   General Comments       Exercises     Shoulder Instructions      Home Living Family/patient expects to be discharged to:: Unsure  Additional Comments: pt from [facility], ? independent, ALF, memory care      Prior Functioning/Environment          Comments: pt states he cared for himself        OT Problem List: Decreased activity tolerance;Impaired balance (sitting and/or standing);Decreased safety awareness      OT Treatment/Interventions: Self-care/ADL training;Balance training;Patient/family education    OT Goals(Current goals can be found in the care plan section) Acute Rehab OT Goals Patient Stated Goal: none stated OT Goal Formulation: With patient Time For Goal Achievement:  11/23/16 Potential to Achieve Goals: Good ADL Goals Pt Will Transfer to Toilet: with modified independence;ambulating;regular height toilet Pt Will Perform Tub/Shower Transfer: Shower transfer;with min guard assist;ambulating;shower seat Additional ADL Goal #1: pt will gather clothes for adls at mod I level  OT Frequency: Min 2X/week   Barriers to D/C:            Co-evaluation              End of Session    Activity Tolerance: Patient tolerated treatment well Patient left: in chair;with call bell/phone within reach;with chair alarm set  OT Visit Diagnosis: Unsteadiness on feet (R26.81)                Time: 1137-1150 OT Time Calculation (min): 13 min Charges:  OT General Charges $OT Visit: 1 Procedure OT Evaluation $OT Eval Low Complexity: 1 Procedure G-Codes:     [place], OTR/L [phone] 11/16/2016  Renato Spellman 11/16/2016, 12:27 PM

## 2016-11-16 NOTE — Care Management Note (Signed)
Case Management Note  Patient Details  Name: Logan Day MRN: [id number] Date of Birth: 1933-05-07  Subjective/Objective:  83 y/o m admitted w/Asp pna. From [place]-CSW to confirm which level @ [employer]. PT recc ALF.                  Action/Plan:d/c plan ALF   Expected Discharge Date:   (unknown)               Expected Discharge Plan:  Assisted Living / Rest Home  In-House Referral:  Clinical Social Work  Discharge planning Services  CM Consult  Post Acute Care Choice:    Choice offered to:     DME Arranged:    DME Agency:     HH Arranged:    [place] Agency:     Status of Service:  In process, will continue to follow  If discussed at Long Length of Stay Meetings, dates discussed:    Additional Comments:  Dessa Phi, RN 11/16/2016, 11:55 AM

## 2016-11-16 NOTE — Progress Notes (Signed)
PROGRESS NOTE    Logan Day  DXA:[id number] DOB: Nov 30, 1932 DOA: 11/15/2016 PCP: Pcp Not In System   Brief Narrative:  Logan Day is a 83 y.o. male with medical history significant of dementia, atrial fibrillation, congestive heart failure, COPD, CAD, hypertension, hyperlipidemia who presents emerged department with complaints of diarrhea and rectal pain after starting Augmentin on 11/11/2016 for worsening pneumonia. Patient was reportedly given other oral antibiotics prior to starting Augmentin with limited efficacy. In the emergency department, patient was found to be hypoxic with O2 sats in the upper 80s. Patient is baseline O2 nave. Chest x-ray is notable for bilateral airspace disease, right worse than left. Patient was started on empiric cefepime and vancomycin but Vancomycin was stopped because MRSA was negative. C. difficile studies were ordered and GI Pathogen Panel were Negative.   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   PAF (paroxysmal atrial fibrillation) (HCC)  Acute Hypoxic Respiratory Failure likely 2/2 to Pneumonia with concomitant COPD -Improved; and Weaned off of O2 now -Maintain O2 Saturations >92% and Continuous Pulse Oximetry -C/w IV Cefepime to treat PNA  Bilateral Pneumonia -SLP Eval showed Mild Aspiration Risk and recommended Regular Diet with Thin Liquids -Blood Cx x 2 showed NGTD < 24 hours -MRSA was Negative so Discontinued IV Vancomycin -Sputum Cx Specimen was not acceptable for Testing and recommending Recollection -C/w IV Abx with Cefepime; IV Vancomycin D/C'd -C/w DuoNeb 3 mL TID and q2hprn -C/w IV Methylprednisolone 40 mg q12h  Diarrhea -Likely Abx Induced; C/w Florastor 250 mg po BID -C Difficile Negative  -GI Pathogen Panel  Negative -Discontinued Bisacodyl 5 mg Daily prn  Hypertension -Not on Any Hypertensives as an outpatient -Continue to Monitor Closely  Hyperlipidemia -C/w Pravastatin 40 mg po qHS  Hypothyroidism -Check TSH, Free T4 -C/w Levothyroxine 75 mcg po Daily  CKD Stage 2 -BUN/Cr went from 19/1.47 -> 22/1.41 -C/w IVF Rehdyration with NS at 75 mL/hr; Avoid Nephrotoxics -Repeat CMP in AM  GERD -C/w Pantoprazole 40 mg po Daily  Depression/Anxiety -Stable -C/w Sertraline 100 mg po qHS and Trazdone 75 mg po qHS  Paroxysmal Atrial Fibrillation -C/w Amiodarone 200 mg po for Rhythm Control  -ASA 81 mg po Daily for Anticoagulation as has Hx of GIB -C/w Telemetry   Abnormal LFT's/Transaminitis -Repeat CMP in AM -Obtain Abdominal Ultrasound -Obtain Hepatitis Panel  Hx of GI Bleed  -Only on ASA for [facility]   CAD with Hx of MI x2 and Circ Stent -C/w ASA 81 mg daily, Atorvastatin 40 mg po qHS, NTG 0.4 mg sL q76minprn  Hx of TIA -C/w ASA 81 mg daily and Atorvastatin 40 mg po qHS,  Dementia -Stable  Hx of Prostate Cancer -No Active Issues  DVT prophylaxis: Heparin 5,000 units sq q8h Code Status: DO NOT RESUSCITATE Family Communication: No Family present at bedside Disposition Plan: Home Health PT likely depending on progress vs. SNF  Consultants:   None   Procedures: None   Antimicrobials: Anti-infectives    Start     Dose/Rate Route Frequency Ordered Stop   11/16/16 1400  vancomycin (VANCOCIN) IVPB 1000 mg/200 mL premix  Status:  Discontinued     1,000 mg 200 mL/hr over 60 Minutes Intravenous Every 24 hours 11/15/16 1204 11/15/16 1610   11/16/16 1400  vancomycin (VANCOCIN) IVPB 1000 mg/200 mL  premix  Status:  Discontinued     1,000 mg 200 mL/hr over 60 Minutes Intravenous Every 24 hours 11/15/16 1701 11/16/16 0929   11/16/16 1200  ceFEPIme (MAXIPIME) 2 g in dextrose 5 % 50 mL IVPB  Status:  Discontinued     2 g 100 mL/hr over 30 Minutes Intravenous Every 24 hours 11/15/16  1204 11/15/16 1610   11/16/16 1200  ceFEPIme (MAXIPIME) 2 g in dextrose 5 % 50 mL IVPB  Status:  Discontinued     2 g 100 mL/hr over 30 Minutes Intravenous Every 24 hours 11/15/16 1702 11/15/16 1747   11/16/16 1200  ceFEPIme (MAXIPIME) 2 g in dextrose 5 % 50 mL IVPB     2 g 100 mL/hr over 30 Minutes Intravenous Every 24 hours 11/15/16 1747 11/23/16 1159   11/15/16 1615  ceFEPIme (MAXIPIME) 1 g in dextrose 5 % 50 mL IVPB  Status:  Discontinued     1 g 100 mL/hr over 30 Minutes Intravenous Every 8 hours 11/15/16 1610 11/15/16 1702   11/15/16 1145  ceFEPIme (MAXIPIME) 2 g in dextrose 5 % 50 mL IVPB     2 g 100 mL/hr over 30 Minutes Intravenous  Once 11/15/16 1134 11/15/16 1546   11/15/16 1145  vancomycin (VANCOCIN) IVPB 1000 mg/200 mL premix     1,000 mg 200 mL/hr over 60 Minutes Intravenous  Once 11/15/16 1134 11/15/16 1547     Subjective: Seen and examined and stated that he felt slightly better than yesterday. No nausea or vomiting. SOB improved. States he does not want to go through with what he did with the diarrhea. No other concerns or complaints.   Objective: Vitals:   11/15/16 1924 11/15/16 2120 11/16/16 0525 11/16/16 1530  BP: (!) 133/59  125/73 122/68  Pulse: (!) 59  (!) 52 76  Resp: 18  18 19   Temp: 98.1 F (36.7 C)  98.7 F (37.1 C) 98.4 F (36.9 C)  TempSrc: Oral  Oral Oral  SpO2: 96% 95% 96% 93%  Weight: 69.8 kg (153 lb 14.1 oz)     Height: 6\' 1"  (1.854 m)       Intake/Output Summary (Last 24 hours) at 11/16/16 2034 Last data filed at 11/16/16 1834  Gross per 24 hour  Intake          2646.25 ml  Output                0 ml  Net          2646.25 ml   Filed Weights   11/15/16 1657 11/15/16 1924  Weight: 72.6 kg (160 lb) 69.8 kg (153 lb 14.1 oz)   Examination: Physical Exam:  Constitutional:  NAD and appears calm and comfortable Eyes: Lids and conjunctivae normal, sclerae anicteric  ENMT: External Ears, Nose appear normal. Grossly normal hearing. Mucous  membranes are moist.  Neck: Appears normal, supple, no cervical masses, normal ROM, no appreciable thyromegaly, no JVD Respiratory: Bilaterally diminished to auscultation bilaterally with some rhonchi. No wheezing, rales, or crackles. Normal respiratory effort and patient is not tachypenic. No accessory muscle use.  Cardiovascular: RRR, no murmurs / rubs / gallops. S1 and S2 auscultated. No extremity edema.  Abdomen: Soft, non-tender, non-distended. No masses palpated. No appreciable hepatosplenomegaly. Bowel sounds positive x4.  GU: Deferred. Musculoskeletal: No clubbing / cyanosis of digits/nails. No joint deformity upper and lower extremities. No contractures Skin: No rashes, lesions, ulcer on limited skin evaluation. No induration; Warm and dry.  Neurologic: CN 2-12  grossly intact with no focal deficits. Romberg sign cerebellar reflexes not assessed.  Psychiatric: Normal judgment and insight. Alert but not oriented x 3. Normal mood and appropriate affect.   Data Reviewed: I have personally reviewed following labs and imaging studies  CBC:  Recent Labs Lab 11/15/16 1052 11/16/16 0617  WBC 7.9 7.6  NEUTROABS 6.5  --   HGB 11.9* 12.0*  HCT 36.3* 36.5*  MCV 91.7 92.2  PLT 245 998   Basic Metabolic Panel:  Recent Labs Lab 11/15/16 1052 11/16/16 0617  NA 138 139  K 3.7 5.1  CL 107 108  CO2 24 23  GLUCOSE 122* 149*  BUN 19 22*  CREATININE 1.47* 1.41*  CALCIUM 8.1* 8.4*   GFR: Estimated Creatinine Clearance: 40.6 mL/min (A) (by C-G formula based on SCr of 1.41 mg/dL (H)). Liver Function Tests:  Recent Labs Lab 11/15/16 1052 11/16/16 0617  AST 173* 178*  ALT 177* 183*  ALKPHOS 67 64  BILITOT 0.6 0.6  PROT 7.0 6.9  ALBUMIN 2.9* 2.8*    Recent Labs Lab 11/15/16 1052  LIPASE 24   No results for input(s): AMMONIA in the last 168 hours. Coagulation Profile: No results for input(s): INR, PROTIME in the last 168 hours. Cardiac Enzymes: No results for input(s):  CKTOTAL, CKMB, CKMBINDEX, TROPONINI in the last 168 hours. BNP (last 3 results) No results for input(s): PROBNP in the last 8760 hours. HbA1C: No results for input(s): HGBA1C in the last 72 hours. CBG: No results for input(s): GLUCAP in the last 168 hours. Lipid Profile: No results for input(s): CHOL, HDL, LDLCALC, TRIG, CHOLHDL, LDLDIRECT in the last 72 hours. Thyroid Function Tests: No results for input(s): TSH, T4TOTAL, FREET4, T3FREE, THYROIDAB in the last 72 hours. Anemia Panel: No results for input(s): VITAMINB12, FOLATE, FERRITIN, TIBC, IRON, RETICCTPCT in the last 72 hours. Sepsis Labs:  Recent Labs Lab 11/15/16 1214  LATICACIDVEN 1.01    Recent Results (from the past 240 hour(s))  Gastrointestinal Panel by PCR , Stool     Status: None   Collection Time: 11/15/16  9:46 AM  Result Value Ref Range Status   Campylobacter species NOT DETECTED NOT DETECTED Final   Plesimonas shigelloides NOT DETECTED NOT DETECTED Final   Salmonella species NOT DETECTED NOT DETECTED Final   Yersinia enterocolitica NOT DETECTED NOT DETECTED Final   Vibrio species NOT DETECTED NOT DETECTED Final   Vibrio cholerae NOT DETECTED NOT DETECTED Final   Enteroaggregative E coli (EAEC) NOT DETECTED NOT DETECTED Final   Enteropathogenic E coli (EPEC) NOT DETECTED NOT DETECTED Final   Enterotoxigenic E coli (ETEC) NOT DETECTED NOT DETECTED Final   Shiga like toxin producing E coli (STEC) NOT DETECTED NOT DETECTED Final   Shigella/Enteroinvasive E coli (EIEC) NOT DETECTED NOT DETECTED Final   Cryptosporidium NOT DETECTED NOT DETECTED Final   Cyclospora cayetanensis NOT DETECTED NOT DETECTED Final   Entamoeba histolytica NOT DETECTED NOT DETECTED Final   Giardia lamblia NOT DETECTED NOT DETECTED Final   Adenovirus F40/41 NOT DETECTED NOT DETECTED Final   Astrovirus NOT DETECTED NOT DETECTED Final   Norovirus GI/GII NOT DETECTED NOT DETECTED Final   Rotavirus A NOT DETECTED NOT DETECTED Final    Sapovirus (I, II, IV, and V) NOT DETECTED NOT DETECTED Final  C difficile quick scan w PCR reflex     Status: None   Collection Time: 11/15/16  9:46 AM  Result Value Ref Range Status   C Diff antigen NEGATIVE NEGATIVE Final   C Diff  toxin NEGATIVE NEGATIVE Final   C Diff interpretation No C. difficile detected.  Final  Culture, blood (Routine X 2) w Reflex to ID Panel     Status: None (Preliminary result)   Collection Time: 11/15/16  1:29 PM  Result Value Ref Range Status   Specimen Description BLOOD LEFT ANTECUBITAL  Final   Special Requests   Final    BOTTLES DRAWN AEROBIC AND ANAEROBIC Blood Culture adequate volume   Culture   Final    NO GROWTH < 24 HOURS Performed at [facility], [address], [place], [place] [address]    Report Status PENDING  Incomplete  Culture, blood (Routine X 2) w Reflex to ID Panel     Status: None (Preliminary result)   Collection Time: 11/15/16  6:13 PM  Result Value Ref Range Status   Specimen Description BLOOD RIGHT HAND  Final   Special Requests   Final    BOTTLES DRAWN AEROBIC AND ANAEROBIC Blood Culture adequate volume   Culture   Final    NO GROWTH < 24 HOURS Performed at [facility], [place] [address] [place], [place] [address]    Report Status PENDING  Incomplete  MRSA PCR Screening     Status: None   Collection Time: 11/15/16 10:30 PM  Result Value Ref Range Status   MRSA by PCR NEGATIVE NEGATIVE Final    Comment:        The GeneXpert MRSA Assay (FDA approved for NASAL specimens only), is one component of a comprehensive MRSA colonization surveillance program. It is not intended to diagnose MRSA infection nor to guide or monitor treatment for MRSA infections.   Culture, sputum-assessment     Status: None   Collection Time: 11/16/16  6:00 AM  Result Value Ref Range Status   Specimen Description SPUTUM  Final   Special Requests NONE  Final   Sputum evaluation   Final    Sputum specimen not acceptable for testing.   Please recollect.   INFORMED GRIFFIN,4E @0621  ON  4.17.18 BY [facility]    Report Status 11/16/2016 FINAL  Final    Radiology Studies: Dg Chest 2 View  Result Date: 11/15/2016 CLINICAL DATA:  Cough and congestion for 1-2 weeks. EXAM: CHEST  2 VIEW COMPARISON:  PA and lateral chest 08/26/2015 and 11/13/2015. CT chest 11/30/2014 FINDINGS: The lungs are emphysematous. There is patchy bilateral lower lobe airspace disease, much worse on the right. No pneumothorax or pleural effusion. Heart size normal. Aortic atherosclerosis is noted. No acute bony abnormality. IMPRESSION: Right much worse than left lower lobe airspace disease worrisome for pneumonia. Recommend followup to clearing. Pulmonary hyperexpansion suggestive of emphysema. Electronically Signed   By: Inge Rise M.D.   On: 11/15/2016 10:07   Scheduled Meds: . amiodarone  200 mg Oral Daily  . aspirin  81 mg Oral Daily  . feeding supplement (ENSURE ENLIVE)  237 mL Oral BID BM  . ferrous sulfate  325 mg Oral BID WC  . heparin  5,000 Units Subcutaneous Q8H  . ipratropium-albuterol  3 mL Nebulization TID  . levothyroxine  75 mcg Oral QAC breakfast  . methylPREDNISolone (SOLU-MEDROL) injection  40 mg Intravenous Q12H  . multivitamin with minerals  1 tablet Oral Daily  . pantoprazole  40 mg Oral Q1200  . pravastatin  40 mg Oral QPM  . saccharomyces boulardii  250 mg Oral BID  . sertraline  100 mg Oral QPM  . sodium chloride flush  3 mL Intravenous  Q12H  . traZODone  75 mg Oral QHS   Continuous Infusions: . sodium chloride 75 mL/hr at 11/16/16 1834  . ceFEPime (MAXIPIME) IV Stopped (11/16/16 1315)    LOS: 1 day   Kerney Elbe, DO [facility] Pager [phone]  If 7PM-7AM, please contact night-coverage [email or web address] Password [facility] 11/16/2016, 8:34 PM

## 2016-11-16 NOTE — Evaluation (Signed)
Physical Therapy Evaluation Patient Details Name: Logan Day MRN: [id number] DOB: June 01, 1933 Today's Date: 11/16/2016   History of Present Illness  Logan Day is a 83 y.o. male with medical history significant of dementia, atrial fibrillation, congestive heart failure, COPD, CAD, hypertension, hyperlipidemia who presented to ED with complaints of diarrhea after starting Augmentin on 11/11/2016 for worsening pneumonia  Clinical Impression  Pt admitted with above diagnosis. Pt currently with functional limitations due to the deficits listed below (see PT Problem List).  Pt will benefit from skilled PT to increase their independence and safety with mobility to allow discharge to the venue listed below.   Unsure if pt from ALF or Memory Care environment--likely will be able to return to ALF setting unless progress is slow and ALF unable to provide needed level of care/supervision; will continue to follow for needs    Follow Up Recommendations Home health PT ((SNF vs HHPT)likely HHPT at ALF, depending on progress)    Equipment Recommendations  None recommended by PT    Recommendations for Other Services       Precautions / Restrictions Precautions Precautions: Fall      Mobility  Bed Mobility               General bed mobility comments: NT--in bathroom with RN on PT arrival  Transfers Overall transfer level: Needs assistance Equipment used: Rolling walker (2 wheeled) Transfers: Sit to/from Stand Sit to Stand: Min guard         General transfer comment: for safety  Ambulation/Gait Ambulation/Gait assistance: Supervision;Min guard Ambulation Distance (Feet): 200 Feet Assistive device: None (and IV pole) Gait Pattern/deviations: Step-through pattern;Decreased stride length;Trunk flexed;Drifts right/left     General Gait Details: pt with LOB x2 with delayed response but recovers when given time  Stairs            Wheelchair Mobility    Modified  [place] (Stroke Patients Only)       Balance Overall balance assessment: Needs assistance   Sitting balance-Leahy Scale: Good     Standing balance support: No upper extremity supported Standing balance-Leahy Scale: Good               High level balance activites: Side stepping;Backward walking;Direction changes;Turns;Head turns High Level Balance Comments: pt with delayed balance reactions but is able to recover balance with perturbations with close guarding; pt is able to pick up object from floor without overt LOB but does appear generally unsteady and reaches out for wall to steady self             Pertinent Vitals/Pain Pain Assessment: No/denies pain    Home Living Family/patient expects to be discharged to::  [facility])                      Prior Function           Comments: previous notes state pt from ALF--pt reports he resideds currently in ALF, unable to state the name of facility     Hand Dominance        Extremity/Trunk Assessment   Upper Extremity Assessment Upper Extremity Assessment: Defer to OT evaluation    Lower Extremity Assessment Lower Extremity Assessment: Overall WFL for tasks assessed       Communication   Communication: HOH;Expressive difficulties  Cognition Arousal/Alertness: Awake/alert Behavior During Therapy: WFL for tasks assessed/performed Overall Cognitive Status: History of cognitive impairments - at baseline Area of Impairment: Safety/judgement  Memory: Decreased short-term memory   Safety/Judgement: Decreased awareness of safety     General Comments: baseline dementia, mildly impulsive      General Comments      Exercises     Assessment/Plan    PT Assessment Patient needs continued PT services  PT Problem List Decreased balance;Decreased activity tolerance;Decreased cognition       PT Treatment Interventions Gait training;Functional mobility  training;Therapeutic activities;Therapeutic exercise;Balance training    PT Goals (Current goals can be found in the Care Plan section)  Acute Rehab PT Goals Patient Stated Goal: dance a lot PT Goal Formulation: Patient unable to participate in goal setting Time For Goal Achievement: 11/30/16 Potential to Achieve Goals: Good    Frequency Min 3X/week   Barriers to discharge        Co-evaluation               End of Session Equipment Utilized During Treatment: Gait belt Activity Tolerance: Patient tolerated treatment well Patient left: in chair;with call bell/phone within reach;with chair alarm set   PT Visit Diagnosis: Unsteadiness on feet (R26.81)    Time: 7262-0355 PT Time Calculation (min) (ACUTE ONLY): 14 min   Charges:   PT Evaluation $PT Eval Low Complexity: 1 Procedure     PT G CodesKenyon Day, PT Pager: [phone] 11/16/2016   [facility] 11/16/2016, 11:40 AM

## 2016-11-17 ENCOUNTER — Inpatient Hospital Stay (HOSPITAL_COMMUNITY): Payer: Medicare Other

## 2016-11-17 DIAGNOSIS — N182 Chronic kidney disease, stage 2 (mild): Secondary | ICD-10-CM

## 2016-11-17 DIAGNOSIS — J690 Pneumonitis due to inhalation of food and vomit: Principal | ICD-10-CM

## 2016-11-17 DIAGNOSIS — K8020 Calculus of gallbladder without cholecystitis without obstruction: Secondary | ICD-10-CM

## 2016-11-17 DIAGNOSIS — R945 Abnormal results of liver function studies: Secondary | ICD-10-CM

## 2016-11-17 DIAGNOSIS — I10 Essential (primary) hypertension: Secondary | ICD-10-CM

## 2016-11-17 LAB — PROCALCITONIN: Procalcitonin: <0.1 ng/mL

## 2016-11-17 LAB — CBC WITH DIFFERENTIAL/PLATELET
Basophils Absolute: 0 10*3/uL (ref 0.0–0.1)
Basophils Relative: 0 %
EOS ABS: 0 10*3/uL (ref 0.0–0.7)
Eosinophils Relative: 0 %
HCT: 32.9 % — ABNORMAL LOW (ref 39.0–52.0)
HEMOGLOBIN: 10.9 g/dL — AB (ref 13.0–17.0)
LYMPHS ABS: 0.7 10*3/uL (ref 0.7–4.0)
LYMPHS PCT: 4 %
MCH: 30.3 pg (ref 26.0–34.0)
MCHC: 33.1 g/dL (ref 30.0–36.0)
MCV: 91.4 fL (ref 78.0–100.0)
Monocytes Absolute: 0.6 10*3/uL (ref 0.1–1.0)
Monocytes Relative: 3 %
NEUTROS ABS: 17.2 10*3/uL — AB (ref 1.7–7.7)
NEUTROS PCT: 93 %
Platelets: 279 10*3/uL (ref 150–400)
RBC: 3.6 MIL/uL — AB (ref 4.22–5.81)
RDW: 15.4 % (ref 11.5–15.5)
WBC: 18.5 10*3/uL — AB (ref 4.0–10.5)

## 2016-11-17 LAB — COMPREHENSIVE METABOLIC PANEL
ALK PHOS: 57 U/L (ref 38–126)
ALT: 183 U/L — AB (ref 17–63)
AST: 168 U/L — ABNORMAL HIGH (ref 15–41)
Albumin: 2.5 g/dL — ABNORMAL LOW (ref 3.5–5.0)
Anion gap: 7 (ref 5–15)
BUN: 28 mg/dL — ABNORMAL HIGH (ref 6–20)
CALCIUM: 8.2 mg/dL — AB (ref 8.9–10.3)
CO2: 21 mmol/L — AB (ref 22–32)
CREATININE: 1.43 mg/dL — AB (ref 0.61–1.24)
Chloride: 109 mmol/L (ref 101–111)
GFR calc non Af Amer: 44 mL/min — ABNORMAL LOW (ref >60)
GFR, EST AFRICAN AMERICAN: 51 mL/min — AB (ref >60)
Glucose, Bld: 136 mg/dL — ABNORMAL HIGH (ref 65–99)
Potassium: 4.8 mmol/L (ref 3.5–5.1)
SODIUM: 137 mmol/L (ref 135–145)
Total Bilirubin: 0.4 mg/dL (ref 0.3–1.2)
Total Protein: 6.2 g/dL — ABNORMAL LOW (ref 6.5–8.1)

## 2016-11-17 LAB — TSH: TSH: 2.15 u[IU]/mL (ref 0.350–4.500)

## 2016-11-17 LAB — T4, FREE: Free T4: 0.92 ng/dL (ref 0.61–1.12)

## 2016-11-17 LAB — MAGNESIUM: Magnesium: 1.9 mg/dL (ref 1.7–2.4)

## 2016-11-17 LAB — PHOSPHORUS: PHOSPHORUS: 3.1 mg/dL (ref 2.5–4.6)

## 2016-11-17 MED ORDER — AZITHROMYCIN 250 MG PO TABS: ORAL_TABLET | ORAL | 0 refills | Status: DC

## 2016-11-17 MED ORDER — PREDNISONE 10 MG PO TABS: ORAL_TABLET | ORAL | 0 refills | Status: DC

## 2016-11-17 MED ORDER — SACCHAROMYCES BOULARDII 250 MG PO CAPS: 250.0000 mg | ORAL_CAPSULE | Freq: Two times a day (BID) | ORAL | 0 refills | Status: DC

## 2016-11-17 MED ORDER — PREDNISONE 20 MG PO TABS: 40.0000 mg | ORAL_TABLET | Freq: Every day | ORAL | Status: DC

## 2016-11-17 NOTE — NC FL2 (Signed)
[place] LEVEL OF CARE SCREENING TOOL     IDENTIFICATION  Patient Name: Logan Day Birthdate: 10-11-1932 Sex: male Admission Date (Current Location): 11/15/2016  [facility] and [place] Number:  Herbalist and Address:  [facility],  [place], [place]      Provider Number: [id number]  Attending Physician Name and Address:  Doreatha Lew, MD  Relative Name and Phone Number:       Current Level of Care: Hospital Recommended Level of Care: [place] Prior Approval Number:    Date Approved/Denied:   PASRR Number:    Discharge Plan: Other (Comment) (ALF [facility])    Current Diagnoses: Patient Active Problem List   Diagnosis Date Noted  . Aspiration pneumonia ([place]) 11/15/2016  . Small bowel obstruction ([place]) 09/18/2015  . UTI (lower urinary tract infection) 09/18/2015  . SBO (small bowel obstruction) ([place]) 09/18/2015  . Protein-calorie malnutrition, severe 08/27/2015  . GI bleed 08/26/2015  . Symptomatic anemia 08/26/2015  . CKD (chronic kidney disease), stage II 08/26/2015  . Physical deconditioning 08/26/2015  . Protein calorie malnutrition ([place]) 08/26/2015  . Anticoagulated   . Blood loss anemia   . Constipation   . Bleeding gastrointestinal   . Dementia with behavioral disturbance 05/19/2015  . Severe recurrent major depression without psychotic features ([place]) 05/19/2015  . Incontinence of urine 04/23/2015  . Medicare annual wellness visit, subsequent 04/22/2015  . Anxiety and depression 01/22/2015  . On amiodarone therapy 12/11/2014  . Chronic combined systolic and diastolic congestive heart failure ([place])   . PAF (paroxysmal atrial fibrillation) ([place])   . Malnutrition of moderate degree ([place]) 11/30/2014  . Acute on chronic respiratory failure with hypoxia ([place]) 11/29/2014  . CKD (chronic kidney disease) stage 2, GFR 60-89 ml/min 11/29/2014  . Hyperlipidemia 11/29/2014  .  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 Weight loss, unintentional 11/29/2014  . Hard of hearing 11/29/2014  . Essential hypertension 12/18/2013  . Other and unspecified angina pectoris 10/19/2012    Class: Acute  . Atherosclerotic heart disease of native coronary artery with angina pectoris ([place]) 10/19/2012  . Aphasia S/P CVA 07/15/2011    Orientation RESPIRATION BLADDER Height & Weight     Self, Situation, Place  Normal Incontinent Weight: 153 lb 14.1 oz (69.8 kg) Height:  6\' 1"  (185.4 cm)  BEHAVIORAL SYMPTOMS/MOOD NEUROLOGICAL BOWEL NUTRITION STATUS        Diet (Regular)  AMBULATORY STATUS COMMUNICATION OF NEEDS Skin   Limited Assist Verbally Normal                       Personal Care Assistance Level of Assistance  Bathing, Feeding, Dressing Bathing Assistance: Limited assistance Feeding assistance: Independent Dressing Assistance: Limited assistance     Functional Limitations Info             SPECIAL CARE FACTORS FREQUENCY  PT (By licensed PT), OT (By licensed OT)     PT Frequency: Home Health PT Min 3x/week OT Frequency: Home Health OT Min 2x/week            Contractures Contractures Info: Not present    Additional Factors Info  Code Status, Allergies Code Status Info: DNR Allergies Info: NKA           Medication List    STOP taking these medications   ACIDOPHILUS PO   amoxicillin-clavulanate 875-125 MG tablet Commonly known as:  AUGMENTIN   bisacodyl 5  MG EC tablet Commonly known as:  DULCOLAX   doxycycline 100 MG capsule Commonly known as:  VIBRAMYCIN   ibuprofen 200 MG tablet Commonly known as:  ADVIL,MOTRIN   pravastatin 40 MG tablet Commonly known as:  PRAVACHOL     TAKE these medications   acetaminophen 500 MG tablet Commonly known as:  TYLENOL Take 500 mg by mouth 3 (three) times daily as needed for mild pain.   albuterol 108 (90 Base) MCG/ACT inhaler Commonly known as:  PROVENTIL  HFA;VENTOLIN HFA Inhale 1-2 puffs into the lungs every 6 (six) hours as needed for wheezing or shortness of breath. What changed:  Another medication with the same name was removed. Continue taking this medication, and follow the directions you see here.   ALPRAZolam 0.5 MG tablet Commonly known as:  XANAX Take 1 tablet (0.5 mg total) by mouth 2 (two) times daily as needed for anxiety.   amiodarone 200 MG tablet Commonly known as:  PACERONE Take 1 tablet (200 mg total) by mouth daily.   aspirin 81 MG chewable tablet Chew 81 mg by mouth daily. What changed:  Another medication with the same name was removed. Continue taking this medication, and follow the directions you see here.   azithromycin 250 MG tablet Commonly known as:  ZITHROMAX Take 2 tabs on day 1, then take 1 tab every day for 4 days   cetirizine 5 MG tablet Commonly known as:  ZYRTEC Take 5 mg by mouth at bedtime.   DAILY VITE Tabs Take 1 tablet by mouth daily.   ENSURE Take 237 mLs by mouth 3 (three) times daily.   ferrous sulfate 325 (65 FE) MG tablet Take 1 tablet (325 mg total) by mouth 2 (two) times daily with a meal.   levothyroxine 75 MCG tablet Commonly known as:  SYNTHROID, LEVOTHROID Take 75 mcg by mouth daily before breakfast.   NITROSTAT 0.4 MG SL tablet Generic drug:  nitroGLYCERIN Place 0.4 mg under the tongue every 5 (five) minutes as needed for chest pain.   pantoprazole 40 MG tablet Commonly known as:  PROTONIX Take 1 tablet (40 mg total) by mouth daily at 12 noon.   predniSONE 10 MG tablet Commonly known as:  DELTASONE Take 4 tablets for 3 days; Take 3 tablets for 4 days; Take 2 tablets for 3 days; Take 1 tablet for 4 days   saccharomyces boulardii 250 MG capsule Commonly known as:  FLORASTOR Take 1 capsule (250 mg total) by mouth 2 (two) times daily.   sertraline 100 MG tablet Commonly known as:  ZOLOFT Take 100 mg by mouth every evening.   traZODone 50 MG  tablet Commonly known as:  DESYREL Take 75 mg by mouth at bedtime.       Relevant Imaging Results:  Relevant Lab Results:   Additional Information SSN [id number]  Burnis Medin, LCSW

## 2016-11-17 NOTE — Care Management Note (Signed)
Case Management Note  Patient Details  Name: Logan Day MRN: [id number] Date of Birth: 04/06/1933  Subjective/Objective: received call from [place] @ [facility] [place]- they do not provide HHPT for [employer]. [facility] [place] spoke to [place]  [facility] [employer] they contract W tel#[phone]-left vm that I'm faxing HHPT order to [facility] fax# [phone] w/confirmation. No further CM needs.                   Action/Plan:d/c home w/HHPT   Expected Discharge Date:  11/17/16               Expected Discharge Plan:  Assisted Living / Rest Home  In-House Referral:  Clinical Social Work  Discharge planning Services  CM Consult  Post Acute Care Choice:    Choice offered to:     DME Arranged:    DME Agency:     HH Arranged:  PT HH Agency:   Engineer, site)  Status of Service:  Completed, signed off  If discussed at [employer] of Stay Meetings, dates discussed:    Additional Comments:  Dessa Phi, RN 11/17/2016, 1:04 PM

## 2016-11-17 NOTE — Discharge Summary (Signed)
Physician Discharge Summary  Logan Day  ZOX:[id number]  DOB: November 07, 1932  DOA: 11/15/2016 PCP: Pcp Not In System  Admit date: 11/15/2016 Discharge date: 11/17/2016  Admitted From: ALF Disposition:  ALF   Recommendations for Outpatient Follow-up:  1. Follow up with PCP in 1-2 weeks 2. Please obtain BMP/CBC in one week to check WBC and Cr  3. Please follow up on the following pending results: Final blood cultures  4. Please repeat CXR in 3-4 week to assure resolution of air space diseases  5. Patient may need surgical evaluation as outpatient for cholelithiasis   Home Health: PT  Equipment/Devices: None   Discharge Condition: Stable  CODE STATUS: DNR  Diet recommendation: Heart Healthy   Brief/Interim Summary: Logan Day a 83 y.o.malewith medical history significant of dementia, atrial fibrillation, congestive heart failure, COPD, CAD, hypertension, hyperlipidemia who presents emerged department with complaints of diarrhea and rectal pain after starting Augmentin on 11/11/2016 for worsening pneumonia. Patient was reportedly given other oral antibiotics prior to starting Augmentin with limited efficacy. In the emergency department, patient was found to be hypoxic with O2 sats in the upper 80s. Patient is baseline O2 nave. Chest x-ray is notable for bilateral airspace disease, right worse than left. Patient was started on empiric cefepime and vancomycin but Vancomycin was stopped because MRSA was negative. C. difficile studies were ordered and GI Pathogen Panel were negative. Patient diarrhea improved after florastor was started. Patient was weaned of o2 and antibiotics where d/ced given low pro-calcitonin. Patient report feeling back to baseline and deemed to be safe for discharge and follow up with PCP   Subjective: Patient seen and examined doing well, this morning. Diarrhea has resolved, patient tolerating diet and working well with PT. Denies abdominal pain, chest pain  and SOB.  Discharge Diagnoses/Hospital Course:  Acute Hypoxic Respiratory Failure likely 2/2 to Pneumonia and COPD -Improved, Off O2  -Treated with Solumedrol transitioned to Prednisone on the day of discharge  -Follow up with PCP   Bilateral Pneumonia -Initially treated with Vanc and Cefepime x 2 days, nebs and solumedrol  -Pro calcitonin <0.10 will d/c abx, although given Hx of COPD will discharge on Zpack x 5 days for course completion  -SLP Eval showed Mild Aspiration Risk and recommended Regular Diet with Thin Liquids -Blood Cx x 2 NGT -Continue home inhalers  Leukocytosis - steroid induced -Patient was treated with high doses of solumedrol  -No signs of active infection at this time and patient afebrile  -Monitor CBC in 1 week  Diarrhea - Resolved, no tolerating diet.  -Multifactorial, abx use PTA (Augmentin) and cholelithiasis  -C Difficile Negative  -GI Pathogen Panel Negative -Started on Florastor  - will continue for now   Hypertension - BP remained stable during hospital stay  -Diet controlled -Continue to monitor  -F/u with PCP   Hyperlipidemia -Holding Pravastatin due to elevated LFT's   Hypothyroidism -Clinically euthyroid -Continue home dose Synthroid   CKD Stage 2 -Cr at baseline  -Avoid nephrotoxin  -Repeat BMP in 1 week   Depression/Anxiety -Stable  -Continue home medications   Paroxysmal Atrial Fibrillation -Continued on Amiodarone 200 mg po -Patient on ASA 81 mg po Daily for A/C due to  Hx of GIB  Elevated liver enzymes - 2/2 cholelithiases  Poss passed a stone causing increase in LFT's  Patient asymptomatic at this time, will defer to PCP for surgical referral in case that patient wishes cholecystectomy.  Hold statin for now   Hx of GI  Bleed  -Only on ASA for AC  Dementia -Stable  Hx of Prostate Cancer -No Active Issues   All other chronic medical condition were stable during the hospitalization.  Patient was seen by  physical therapy, recommending HH PT  On the day of the discharge the patient's vitals were stable, and no other acute medical condition were reported by patient. Patient was felt safe to be discharge to ALF  Discharge Instructions  You were cared for by a hospitalist during your hospital stay. If you have any questions about your discharge medications or the care you received while you were in the hospital after you are discharged, you can call the unit and asked to speak with the hospitalist on call if the hospitalist that took care of you is not available. Once you are discharged, your primary care physician will handle any further medical issues. Please note that NO REFILLS for any discharge medications will be authorized once you are discharged, as it is imperative that you return to your primary care physician (or establish a relationship with a primary care physician if you do not have one) for your aftercare needs so that they can reassess your need for medications and monitor your lab values.  Discharge Instructions    Call MD for:  difficulty breathing, headache or visual disturbances    Complete by:  As directed    Call MD for:  extreme fatigue    Complete by:  As directed    Call MD for:  hives    Complete by:  As directed    Call MD for:  persistant dizziness or light-headedness    Complete by:  As directed    Call MD for:  persistant nausea and vomiting    Complete by:  As directed    Call MD for:  redness, tenderness, or signs of infection (pain, swelling, redness, odor or green/yellow discharge around incision site)    Complete by:  As directed    Call MD for:  severe uncontrolled pain    Complete by:  As directed    Call MD for:  temperature >100.4    Complete by:  As directed    Diet - low sodium heart healthy    Complete by:  As directed    Increase activity slowly    Complete by:  As directed      Allergies as of 11/17/2016   No Known Allergies     Medication List     STOP taking these medications   ACIDOPHILUS PO   amoxicillin-clavulanate 875-125 MG tablet Commonly known as:  AUGMENTIN   bisacodyl 5 MG EC tablet Commonly known as:  DULCOLAX   doxycycline 100 MG capsule Commonly known as:  VIBRAMYCIN   ibuprofen 200 MG tablet Commonly known as:  ADVIL,MOTRIN   pravastatin 40 MG tablet Commonly known as:  PRAVACHOL     TAKE these medications   acetaminophen 500 MG tablet Commonly known as:  TYLENOL Take 500 mg by mouth 3 (three) times daily as needed for mild pain.   albuterol 108 (90 Base) MCG/ACT inhaler Commonly known as:  PROVENTIL HFA;VENTOLIN HFA Inhale 1-2 puffs into the lungs every 6 (six) hours as needed for wheezing or shortness of breath. What changed:  Another medication with the same name was removed. Continue taking this medication, and follow the directions you see here.   ALPRAZolam 0.5 MG tablet Commonly known as:  XANAX Take 1 tablet (0.5 mg total) by mouth 2 (two) times  daily as needed for anxiety.   amiodarone 200 MG tablet Commonly known as:  PACERONE Take 1 tablet (200 mg total) by mouth daily.   aspirin 81 MG chewable tablet Chew 81 mg by mouth daily. What changed:  Another medication with the same name was removed. Continue taking this medication, and follow the directions you see here.   azithromycin 250 MG tablet Commonly known as:  ZITHROMAX Take 2 tabs on day 1, then take 1 tab every day for 4 days   cetirizine 5 MG tablet Commonly known as:  ZYRTEC Take 5 mg by mouth at bedtime.   DAILY VITE Tabs Take 1 tablet by mouth daily.   ENSURE Take 237 mLs by mouth 3 (three) times daily.   ferrous sulfate 325 (65 FE) MG tablet Take 1 tablet (325 mg total) by mouth 2 (two) times daily with a meal.   levothyroxine 75 MCG tablet Commonly known as:  SYNTHROID, LEVOTHROID Take 75 mcg by mouth daily before breakfast.   NITROSTAT 0.4 MG SL tablet Generic drug:  nitroGLYCERIN Place 0.4 mg under the  tongue every 5 (five) minutes as needed for chest pain.   pantoprazole 40 MG tablet Commonly known as:  PROTONIX Take 1 tablet (40 mg total) by mouth daily at 12 noon.   predniSONE 10 MG tablet Commonly known as:  DELTASONE Take 4 tablets for 3 days; Take 3 tablets for 4 days; Take 2 tablets for 3 days; Take 1 tablet for 4 days   saccharomyces boulardii 250 MG capsule Commonly known as:  FLORASTOR Take 1 capsule (250 mg total) by mouth 2 (two) times daily.   sertraline 100 MG tablet Commonly known as:  ZOLOFT Take 100 mg by mouth every evening.   traZODone 50 MG tablet Commonly known as:  DESYREL Take 75 mg by mouth at bedtime.      Follow-up [place] Follow up.   Specialty:  Physical Therapy Why:  [place] [facility] Contact information: [address] [place] [address] [phone]          No Known Allergies  Consultations:  None    Procedures/Studies: Dg Chest 2 View  Result Date: 11/15/2016 CLINICAL DATA:  Cough and congestion for 1-2 weeks. EXAM: CHEST  2 VIEW COMPARISON:  PA and lateral chest 08/26/2015 and 11/13/2015. CT chest 11/30/2014 FINDINGS: The lungs are emphysematous. There is patchy bilateral lower lobe airspace disease, much worse on the right. No pneumothorax or pleural effusion. Heart size normal. Aortic atherosclerosis is noted. No acute bony abnormality. IMPRESSION: Right much worse than left lower lobe airspace disease worrisome for pneumonia. Recommend followup to clearing. Pulmonary hyperexpansion suggestive of emphysema. Electronically Signed   By: Inge Rise M.D.   On: 11/15/2016 10:07   US Abdomen Complete  Result Date: 11/17/2016 CLINICAL DATA:  Abnormal LFTs EXAM: ABDOMEN ULTRASOUND COMPLETE COMPARISON:  None. FINDINGS: Gallbladder: Well distended with small stones within. No gallbladder wall thickening or pericholecystic fluid is noted. Common bile duct: Diameter: 5 mm. Liver:  No focal lesion identified. Within normal limits in parenchymal echogenicity. IVC: No abnormality visualized. Pancreas: Visualized portion unremarkable. Spleen: Size and appearance within normal limits. Right Kidney: Length: 10.4 cm. Echogenicity within normal limits. No mass or hydronephrosis visualized. Left Kidney: Length: 9.3 cm. Echogenicity within normal limits. No mass or hydronephrosis visualized. Abdominal aorta: No aneurysm visualized. Other findings: None. IMPRESSION: Cholelithiasis without complicating factors. No other focal abnormality is noted. Electronically Signed  By: Inez Catalina M.D.   On: 11/17/2016 08:50    Discharge Exam: Vitals:   11/16/16 2054 11/17/16 0610  BP: (!) 124/53 130/64  Pulse: 73 71  Resp: 18 18  Temp: 97.9 F (36.6 C) 97.5 F (36.4 C)   Vitals:   11/16/16 1530 11/16/16 2054 11/17/16 0610 11/17/16 1432  BP: 122/68 (!) 124/53 130/64   Pulse: 76 73 71   Resp: 19 18 18    Temp: 98.4 F (36.9 C) 97.9 F (36.6 C) 97.5 F (36.4 C)   TempSrc: Oral Oral Oral   SpO2: 93% 96% 97% 95%  Weight:      Height:        General: Pt is alert, awake, not in acute distress Cardiovascular: RRR, S1/S2 +, no rubs, no gallops Respiratory: CTA bilaterally, no wheezing, no rhonchi Abdominal: Soft, NT, ND, bowel sounds + Extremities: no edema, no cyanosis   The results of significant diagnostics from this hospitalization (including imaging, microbiology, ancillary and laboratory) are listed below for reference.     Microbiology: Recent Results (from the past 240 hour(s))  Gastrointestinal Panel by PCR , Stool     Status: None   Collection Time: 11/15/16  9:46 AM  Result Value Ref Range Status   Campylobacter species NOT DETECTED NOT DETECTED Final   Plesimonas shigelloides NOT DETECTED NOT DETECTED Final   Salmonella species NOT DETECTED NOT DETECTED Final   Yersinia enterocolitica NOT DETECTED NOT DETECTED Final   Vibrio species NOT DETECTED NOT DETECTED Final    Vibrio cholerae NOT DETECTED NOT DETECTED Final   Enteroaggregative E coli (EAEC) NOT DETECTED NOT DETECTED Final   Enteropathogenic E coli (EPEC) NOT DETECTED NOT DETECTED Final   Enterotoxigenic E coli (ETEC) NOT DETECTED NOT DETECTED Final   Shiga like toxin producing E coli (STEC) NOT DETECTED NOT DETECTED Final   Shigella/Enteroinvasive E coli (EIEC) NOT DETECTED NOT DETECTED Final   Cryptosporidium NOT DETECTED NOT DETECTED Final   Cyclospora cayetanensis NOT DETECTED NOT DETECTED Final   Entamoeba histolytica NOT DETECTED NOT DETECTED Final   Giardia lamblia NOT DETECTED NOT DETECTED Final   Adenovirus F40/41 NOT DETECTED NOT DETECTED Final   Astrovirus NOT DETECTED NOT DETECTED Final   Norovirus GI/GII NOT DETECTED NOT DETECTED Final   Rotavirus A NOT DETECTED NOT DETECTED Final   Sapovirus (I, II, IV, and V) NOT DETECTED NOT DETECTED Final  C difficile quick scan w PCR reflex     Status: None   Collection Time: 11/15/16  9:46 AM  Result Value Ref Range Status   C Diff antigen NEGATIVE NEGATIVE Final   C Diff toxin NEGATIVE NEGATIVE Final   C Diff interpretation No C. difficile detected.  Final  Culture, blood (Routine X 2) w Reflex to ID Panel     Status: None (Preliminary result)   Collection Time: 11/15/16  1:29 PM  Result Value Ref Range Status   Specimen Description BLOOD LEFT ANTECUBITAL  Final   Special Requests   Final    BOTTLES DRAWN AEROBIC AND ANAEROBIC Blood Culture adequate volume   Culture   Final    NO GROWTH < 24 HOURS Performed at [facility], [address] [place], [place] [address]    Report Status PENDING  Incomplete  Culture, blood (Routine X 2) w Reflex to ID Panel     Status: None (Preliminary result)   Collection Time: 11/15/16  6:13 PM  Result Value Ref Range Status   Specimen Description BLOOD  RIGHT HAND  Final   Special Requests   Final    BOTTLES DRAWN AEROBIC AND ANAEROBIC Blood Culture adequate volume   Culture   Final    NO  GROWTH < 24 HOURS Performed at [facility], [address] [place], [place] [address]    Report Status PENDING  Incomplete  MRSA PCR Screening     Status: None   Collection Time: 11/15/16 10:30 PM  Result Value Ref Range Status   MRSA by PCR NEGATIVE NEGATIVE Final    Comment:        The GeneXpert MRSA Assay (FDA approved for NASAL specimens only), is one component of a comprehensive MRSA colonization surveillance program. It is not intended to diagnose MRSA infection nor to guide or monitor treatment for MRSA infections.   Culture, sputum-assessment     Status: None   Collection Time: 11/16/16  6:00 AM  Result Value Ref Range Status   Specimen Description SPUTUM  Final   Special Requests NONE  Final   Sputum evaluation   Final    Sputum specimen not acceptable for testing.  Please recollect.   INFORMED GRIFFIN,4E @0621  ON  4.17.18 BY [email or web address]    Report Status 11/16/2016 FINAL  Final     Labs: BNP (last 3 results) No results for input(s): BNP in the last 8760 hours. Basic Metabolic Panel:  Recent Labs Lab 11/15/16 1052 11/16/16 0617 11/17/16 0608  NA 138 139 137  K 3.7 5.1 4.8  CL 107 108 109  CO2 24 23 21*  GLUCOSE 122* 149* 136*  BUN 19 22* 28*  CREATININE 1.47* 1.41* 1.43*  CALCIUM 8.1* 8.4* 8.2*  MG  --   --  1.9  PHOS  --   --  3.1   Liver Function Tests:  Recent Labs Lab 11/15/16 1052 11/16/16 0617 11/17/16 0608  AST 173* 178* 168*  ALT 177* 183* 183*  ALKPHOS 67 64 57  BILITOT 0.6 0.6 0.4  PROT 7.0 6.9 6.2*  ALBUMIN 2.9* 2.8* 2.5*    Recent Labs Lab 11/15/16 1052  LIPASE 24   No results for input(s): AMMONIA in the last 168 hours. CBC:  Recent Labs Lab 11/15/16 1052 11/16/16 0617 11/17/16 0608  WBC 7.9 7.6 18.5*  NEUTROABS 6.5  --  17.2*  HGB 11.9* 12.0* 10.9*  HCT 36.3* 36.5* 32.9*  MCV 91.7 92.2 91.4  PLT 245 259 279   Cardiac Enzymes: No results for input(s): CKTOTAL, CKMB, CKMBINDEX, TROPONINI in the last 168  hours. BNP: Invalid input(s): POCBNP CBG: No results for input(s): GLUCAP in the last 168 hours. D-Dimer No results for input(s): DDIMER in the last 72 hours. Hgb A1c No results for input(s): HGBA1C in the last 72 hours. Lipid Profile No results for input(s): CHOL, HDL, LDLCALC, TRIG, CHOLHDL, LDLDIRECT in the last 72 hours. Thyroid function studies  Recent Labs  11/17/16 0608  TSH 2.150   Anemia work up No results for input(s): VITAMINB12, FOLATE, FERRITIN, TIBC, IRON, RETICCTPCT in the last 72 hours. Urinalysis    Component Value Date/Time   COLORURINE YELLOW 09/18/2015 1856   APPEARANCEUR TURBID (A) 09/18/2015 1856   LABSPEC 1.012 09/18/2015 1856   PHURINE 8.0 09/18/2015 1856   GLUCOSEU NEGATIVE 09/18/2015 1856   HGBUR NEGATIVE 09/18/2015 1856   BILIRUBINUR NEGATIVE 09/18/2015 1856   BILIRUBINUR 2+ 06/10/2015 1444   KETONESUR 15 (A) 09/18/2015 1856   PROTEINUR NEGATIVE 09/18/2015 1856   UROBILINOGEN 2.0 06/10/2015 1444   UROBILINOGEN 1.0 05/18/2015  [place] (A) 09/18/2015 1856   LEUKOCYTESUR TRACE (A) 09/18/2015 1856   Sepsis Labs Invalid input(s): PROCALCITONIN,  WBC,  LACTICIDVEN Microbiology Recent Results (from the past 240 hour(s))  Gastrointestinal Panel by PCR , Stool     Status: None   Collection Time: 11/15/16  9:46 AM  Result Value Ref Range Status   Campylobacter species NOT DETECTED NOT DETECTED Final   Plesimonas shigelloides NOT DETECTED NOT DETECTED Final   Salmonella species NOT DETECTED NOT DETECTED Final   Yersinia enterocolitica NOT DETECTED NOT DETECTED Final   Vibrio species NOT DETECTED NOT DETECTED Final   Vibrio cholerae NOT DETECTED NOT DETECTED Final   Enteroaggregative E coli (EAEC) NOT DETECTED NOT DETECTED Final   Enteropathogenic E coli (EPEC) NOT DETECTED NOT DETECTED Final   Enterotoxigenic E coli (ETEC) NOT DETECTED NOT DETECTED Final   Shiga like toxin producing E coli (STEC) NOT DETECTED NOT DETECTED Final    Shigella/Enteroinvasive E coli (EIEC) NOT DETECTED NOT DETECTED Final   Cryptosporidium NOT DETECTED NOT DETECTED Final   Cyclospora cayetanensis NOT DETECTED NOT DETECTED Final   Entamoeba histolytica NOT DETECTED NOT DETECTED Final   Giardia lamblia NOT DETECTED NOT DETECTED Final   Adenovirus F40/41 NOT DETECTED NOT DETECTED Final   Astrovirus NOT DETECTED NOT DETECTED Final   Norovirus GI/GII NOT DETECTED NOT DETECTED Final   Rotavirus A NOT DETECTED NOT DETECTED Final   Sapovirus (I, II, IV, and V) NOT DETECTED NOT DETECTED Final  C difficile quick scan w PCR reflex     Status: None   Collection Time: 11/15/16  9:46 AM  Result Value Ref Range Status   C Diff antigen NEGATIVE NEGATIVE Final   C Diff toxin NEGATIVE NEGATIVE Final   C Diff interpretation No C. difficile detected.  Final  Culture, blood (Routine X 2) w Reflex to ID Panel     Status: None (Preliminary result)   Collection Time: 11/15/16  1:29 PM  Result Value Ref Range Status   Specimen Description BLOOD LEFT ANTECUBITAL  Final   Special Requests   Final    BOTTLES DRAWN AEROBIC AND ANAEROBIC Blood Culture adequate volume   Culture   Final    NO GROWTH < 24 HOURS Performed at [facility], [address], [place], [place] [address]    Report Status PENDING  Incomplete  Culture, blood (Routine X 2) w Reflex to ID Panel     Status: None (Preliminary result)   Collection Time: 11/15/16  6:13 PM  Result Value Ref Range Status   Specimen Description BLOOD RIGHT HAND  Final   Special Requests   Final    BOTTLES DRAWN AEROBIC AND ANAEROBIC Blood Culture adequate volume   Culture   Final    NO GROWTH < 24 HOURS Performed at [facility], [place] [address] [place], [place] [address]    Report Status PENDING  Incomplete  MRSA PCR Screening     Status: None   Collection Time: 11/15/16 10:30 PM  Result Value Ref Range Status   MRSA by PCR NEGATIVE NEGATIVE Final    Comment:        The GeneXpert MRSA Assay  (FDA approved for NASAL specimens only), is one component of a comprehensive MRSA colonization surveillance program. It is not intended to diagnose MRSA infection nor to guide or monitor treatment for MRSA infections.   Culture, sputum-assessment     Status: None   Collection Time: 11/16/16  6:00 AM  Result Value Ref Range Status   Specimen Description SPUTUM  Final   Special Requests NONE  Final   Sputum evaluation   Final    Sputum specimen not acceptable for testing.  Please recollect.   INFORMED GRIFFIN,4E @0621  ON  4.17.18 BY [facility]    Report Status 11/16/2016 FINAL  Final     Time coordinating discharge: Over 30 minutes  SIGNED:  Chipper Oman, MD  [facility] 11/17/2016, 2:37 PM  Pager please text page via  [email or web address] Password [email or web address]

## 2016-11-17 NOTE — Progress Notes (Signed)
Report called to [facility].  Awaiting ambulance transport. Andre Lefort

## 2016-11-17 NOTE — Care Management Note (Signed)
Case Management Note  Patient Details  Name: DEX BLAKELY MRN: [id number] Date of Birth: [birth year]  Subjective/Objective: Faxed HHPT order w/cofinrmation to [facility] fax#[phone] to [place]. D/c back to [place]-CSW will manage.No further CM needs.                   Action/Plan:d/c ALF w/HHPT   Expected Discharge Date:   (unknown)               Expected Discharge Plan:  Assisted Living / Rest Home  In-House Referral:  Clinical Social Work  Discharge planning Services  CM Consult  Post Acute Care Choice:    Choice offered to:     DME Arranged:    DME Agency:     HH Arranged:  PT [place] Agency:  Other - See comment ([place] @ [employer])  Status of Service:  Completed, signed off  If discussed at [employer] of [employer], dates discussed:    Additional Comments:  Dessa Phi, RN 11/17/2016, 11:58 AM

## 2016-11-18 LAB — HEPATITIS PANEL, ACUTE
HCV Ab: <0.1 s/co ratio (ref 0.0–0.9)
Hep A IgM: NEGATIVE
Hep B C IgM: NEGATIVE
Hepatitis B Surface Ag: NEGATIVE

## 2016-11-20 LAB — CULTURE, BLOOD (ROUTINE X 2)
CULTURE: NO GROWTH
CULTURE: NO GROWTH
SPECIAL REQUESTS: ADEQUATE
Special Requests: ADEQUATE

## 2017-02-05 ENCOUNTER — Encounter (HOSPITAL_COMMUNITY): Payer: Self-pay | Admitting: Emergency Medicine

## 2017-02-05 ENCOUNTER — Emergency Department (HOSPITAL_COMMUNITY): Payer: Medicare Other

## 2017-02-05 ENCOUNTER — Inpatient Hospital Stay (HOSPITAL_COMMUNITY)
Admission: EM | Admit: 2017-02-05 | Discharge: 2017-02-07 | DRG: 064 | Disposition: A | Discharge status: Hospice | Payer: Medicare Other | Attending: Neurology | Admitting: Neurology

## 2017-02-05 DIAGNOSIS — G936 Cerebral edema: Secondary | ICD-10-CM | POA: Diagnosis present

## 2017-02-05 DIAGNOSIS — Z66 Do not resuscitate: Secondary | ICD-10-CM | POA: Diagnosis not present

## 2017-02-05 DIAGNOSIS — R402332 Coma scale, best motor response, abnormal, at arrival to emergency department: Secondary | ICD-10-CM | POA: Diagnosis present

## 2017-02-05 DIAGNOSIS — I4891 Unspecified atrial fibrillation: Secondary | ICD-10-CM | POA: Diagnosis present

## 2017-02-05 DIAGNOSIS — I619 Nontraumatic intracerebral hemorrhage, unspecified: Principal | ICD-10-CM | POA: Diagnosis present

## 2017-02-05 DIAGNOSIS — Z8249 Family history of ischemic heart disease and other diseases of the circulatory system: Secondary | ICD-10-CM

## 2017-02-05 DIAGNOSIS — E7800 Pure hypercholesterolemia, unspecified: Secondary | ICD-10-CM | POA: Diagnosis present

## 2017-02-05 DIAGNOSIS — E860 Dehydration: Secondary | ICD-10-CM | POA: Diagnosis present

## 2017-02-05 DIAGNOSIS — I611 Nontraumatic intracerebral hemorrhage in hemisphere, cortical: Secondary | ICD-10-CM

## 2017-02-05 DIAGNOSIS — R402124 Coma scale, eyes open, to pain, 24 hours or more after hospital admission: Secondary | ICD-10-CM | POA: Diagnosis not present

## 2017-02-05 DIAGNOSIS — Z8546 Personal history of malignant neoplasm of prostate: Secondary | ICD-10-CM

## 2017-02-05 DIAGNOSIS — E785 Hyperlipidemia, unspecified: Secondary | ICD-10-CM | POA: Diagnosis present

## 2017-02-05 DIAGNOSIS — K219 Gastro-esophageal reflux disease without esophagitis: Secondary | ICD-10-CM | POA: Diagnosis present

## 2017-02-05 DIAGNOSIS — R29725 NIHSS score 25: Secondary | ICD-10-CM | POA: Diagnosis present

## 2017-02-05 DIAGNOSIS — G8191 Hemiplegia, unspecified affecting right dominant side: Secondary | ICD-10-CM | POA: Diagnosis present

## 2017-02-05 DIAGNOSIS — N179 Acute kidney failure, unspecified: Secondary | ICD-10-CM | POA: Diagnosis present

## 2017-02-05 DIAGNOSIS — R402354 Coma scale, best motor response, localizes pain, 24 hours or more after hospital admission: Secondary | ICD-10-CM | POA: Diagnosis not present

## 2017-02-05 DIAGNOSIS — J449 Chronic obstructive pulmonary disease, unspecified: Secondary | ICD-10-CM | POA: Diagnosis present

## 2017-02-05 DIAGNOSIS — Z515 Encounter for palliative care: Secondary | ICD-10-CM | POA: Diagnosis not present

## 2017-02-05 DIAGNOSIS — R402222 Coma scale, best verbal response, incomprehensible words, at arrival to emergency department: Secondary | ICD-10-CM | POA: Diagnosis present

## 2017-02-05 DIAGNOSIS — F0390 Unspecified dementia without behavioral disturbance: Secondary | ICD-10-CM | POA: Diagnosis present

## 2017-02-05 DIAGNOSIS — Z7982 Long term (current) use of aspirin: Secondary | ICD-10-CM

## 2017-02-05 DIAGNOSIS — W19XXXA Unspecified fall, initial encounter: Secondary | ICD-10-CM | POA: Diagnosis present

## 2017-02-05 DIAGNOSIS — I252 Old myocardial infarction: Secondary | ICD-10-CM

## 2017-02-05 DIAGNOSIS — R29728 NIHSS score 28: Secondary | ICD-10-CM | POA: Diagnosis not present

## 2017-02-05 DIAGNOSIS — I609 Nontraumatic subarachnoid hemorrhage, unspecified: Secondary | ICD-10-CM | POA: Diagnosis present

## 2017-02-05 DIAGNOSIS — G935 Compression of brain: Secondary | ICD-10-CM | POA: Diagnosis present

## 2017-02-05 DIAGNOSIS — I5022 Chronic systolic (congestive) heart failure: Secondary | ICD-10-CM | POA: Diagnosis present

## 2017-02-05 DIAGNOSIS — R402214 Coma scale, best verbal response, none, 24 hours or more after hospital admission: Secondary | ICD-10-CM | POA: Diagnosis not present

## 2017-02-05 DIAGNOSIS — Z955 Presence of coronary angioplasty implant and graft: Secondary | ICD-10-CM

## 2017-02-05 DIAGNOSIS — N182 Chronic kidney disease, stage 2 (mild): Secondary | ICD-10-CM | POA: Diagnosis present

## 2017-02-05 DIAGNOSIS — Z8673 Personal history of transient ischemic attack (TIA), and cerebral infarction without residual deficits: Secondary | ICD-10-CM

## 2017-02-05 DIAGNOSIS — R4701 Aphasia: Secondary | ICD-10-CM | POA: Diagnosis present

## 2017-02-05 DIAGNOSIS — I2510 Atherosclerotic heart disease of native coronary artery without angina pectoris: Secondary | ICD-10-CM | POA: Diagnosis present

## 2017-02-05 DIAGNOSIS — I639 Cerebral infarction, unspecified: Secondary | ICD-10-CM

## 2017-02-05 DIAGNOSIS — Z87891 Personal history of nicotine dependence: Secondary | ICD-10-CM

## 2017-02-05 DIAGNOSIS — R402142 Coma scale, eyes open, spontaneous, at arrival to emergency department: Secondary | ICD-10-CM | POA: Diagnosis present

## 2017-02-05 DIAGNOSIS — I130 Hypertensive heart and chronic kidney disease with heart failure and stage 1 through stage 4 chronic kidney disease, or unspecified chronic kidney disease: Secondary | ICD-10-CM | POA: Diagnosis present

## 2017-02-05 LAB — I-STAT CHEM 8, ED
BUN: 29 mg/dL — AB (ref 6–20)
CALCIUM ION: 1.09 mmol/L — AB (ref 1.15–1.40)
CHLORIDE: 105 mmol/L (ref 101–111)
Creatinine, Ser: 1.8 mg/dL — ABNORMAL HIGH (ref 0.61–1.24)
GLUCOSE: 119 mg/dL — AB (ref 65–99)
HCT: 41 % (ref 39.0–52.0)
Hemoglobin: 13.9 g/dL (ref 13.0–17.0)
Potassium: 3.8 mmol/L (ref 3.5–5.1)
Sodium: 140 mmol/L (ref 135–145)
TCO2: 24 mmol/L (ref 0–100)

## 2017-02-05 LAB — DIFFERENTIAL
Basophils Absolute: 0 10*3/uL (ref 0.0–0.1)
Basophils Relative: 0 %
EOS PCT: 3 %
Eosinophils Absolute: 0.3 10*3/uL (ref 0.0–0.7)
LYMPHS PCT: 12 %
Lymphs Abs: 1.2 10*3/uL (ref 0.7–4.0)
MONO ABS: 0.5 10*3/uL (ref 0.1–1.0)
MONOS PCT: 5 %
Neutro Abs: 8.1 10*3/uL — ABNORMAL HIGH (ref 1.7–7.7)
Neutrophils Relative %: 80 %

## 2017-02-05 LAB — CBC
HCT: 39.1 % (ref 39.0–52.0)
Hemoglobin: 12.5 g/dL — ABNORMAL LOW (ref 13.0–17.0)
MCH: 30 pg (ref 26.0–34.0)
MCHC: 32 g/dL (ref 30.0–36.0)
MCV: 94 fL (ref 78.0–100.0)
Platelets: 189 10*3/uL (ref 150–400)
RBC: 4.16 MIL/uL — AB (ref 4.22–5.81)
RDW: 15.2 % (ref 11.5–15.5)
WBC: 10.2 10*3/uL (ref 4.0–10.5)

## 2017-02-05 LAB — COMPREHENSIVE METABOLIC PANEL
ALK PHOS: 72 U/L (ref 38–126)
ALT: 20 U/L (ref 17–63)
ANION GAP: 12 (ref 5–15)
AST: 28 U/L (ref 15–41)
Albumin: 3.4 g/dL — ABNORMAL LOW (ref 3.5–5.0)
BILIRUBIN TOTAL: 0.7 mg/dL (ref 0.3–1.2)
BUN: 21 mg/dL — ABNORMAL HIGH (ref 6–20)
CALCIUM: 8.7 mg/dL — AB (ref 8.9–10.3)
CO2: 19 mmol/L — AB (ref 22–32)
Chloride: 106 mmol/L (ref 101–111)
Creatinine, Ser: 1.8 mg/dL — ABNORMAL HIGH (ref 0.61–1.24)
GFR, EST AFRICAN AMERICAN: 39 mL/min — AB (ref >60)
GFR, EST NON AFRICAN AMERICAN: 34 mL/min — AB (ref >60)
Glucose, Bld: 119 mg/dL — ABNORMAL HIGH (ref 65–99)
Potassium: 3.7 mmol/L (ref 3.5–5.1)
SODIUM: 137 mmol/L (ref 135–145)
TOTAL PROTEIN: 7.2 g/dL (ref 6.5–8.1)

## 2017-02-05 LAB — I-STAT TROPONIN, ED: TROPONIN I, POC: 0 ng/mL (ref 0.00–0.08)

## 2017-02-05 LAB — CBG MONITORING, ED: GLUCOSE-CAPILLARY: 115 mg/dL — AB (ref 65–99)

## 2017-02-05 LAB — PROTIME-INR
INR: 0.99
Prothrombin Time: 13.1 seconds (ref 11.4–15.2)

## 2017-02-05 LAB — APTT: aPTT: 28 seconds (ref 24–36)

## 2017-02-05 LAB — MRSA PCR SCREENING: MRSA BY PCR: NEGATIVE

## 2017-02-05 MED ORDER — ORAL CARE MOUTH RINSE
15.0000 mL | Freq: Two times a day (BID) | OROMUCOSAL | Status: DC
  Administered 2017-02-06: 15 mL via OROMUCOSAL

## 2017-02-05 MED ORDER — PANTOPRAZOLE SODIUM 40 MG IV SOLR
40.0000 mg | Freq: Every day | INTRAVENOUS | Status: DC
  Administered 2017-02-06: 40 mg via INTRAVENOUS
  Filled 2017-02-05: qty 40

## 2017-02-05 MED ORDER — NICARDIPINE HCL IN NACL 20-0.86 MG/200ML-% IV SOLN
0.0000 mg/h | INTRAVENOUS | Status: DC
  Administered 2017-02-05: 5 mg/h via INTRAVENOUS

## 2017-02-05 MED ORDER — NICARDIPINE HCL IN NACL 20-0.86 MG/200ML-% IV SOLN
INTRAVENOUS | Status: AC
  Filled 2017-02-05: qty 200

## 2017-02-05 MED ORDER — ACETAMINOPHEN 160 MG/5ML PO SOLN: 650.0000 mg | ORAL | Status: DC | PRN

## 2017-02-05 MED ORDER — ACETAMINOPHEN 325 MG PO TABS: 650.0000 mg | ORAL_TABLET | ORAL | Status: DC | PRN

## 2017-02-05 MED ORDER — IOPAMIDOL (ISOVUE-370) INJECTION 76%
INTRAVENOUS | Status: AC
  Filled 2017-02-05: qty 50

## 2017-02-05 MED ORDER — ACETAMINOPHEN 650 MG RE SUPP: 650.0000 mg | RECTAL | Status: DC | PRN

## 2017-02-05 MED ORDER — SENNOSIDES-DOCUSATE SODIUM 8.6-50 MG PO TABS: 1.0000 | ORAL_TABLET | Freq: Two times a day (BID) | ORAL | Status: DC

## 2017-02-05 MED ORDER — ONDANSETRON HCL 4 MG/2ML IJ SOLN
4.0000 mg | Freq: Once | INTRAMUSCULAR | Status: AC
  Administered 2017-02-05: 4 mg via INTRAVENOUS
  Filled 2017-02-05: qty 2

## 2017-02-05 MED ORDER — STROKE: EARLY STAGES OF RECOVERY BOOK
Freq: Once | Status: AC
  Administered 2017-02-06: 04:00:00
  Filled 2017-02-05 (×2): qty 1

## 2017-02-05 MED ORDER — ONDANSETRON HCL 4 MG/2ML IJ SOLN
4.0000 mg | Freq: Four times a day (QID) | INTRAMUSCULAR | Status: DC | PRN
  Administered 2017-02-06 (×2): 4 mg via INTRAVENOUS
  Filled 2017-02-05 (×2): qty 2

## 2017-02-05 MED ORDER — LABETALOL HCL 5 MG/ML IV SOLN
INTRAVENOUS | Status: AC
  Administered 2017-02-05: 10 mg
  Filled 2017-02-05: qty 4

## 2017-02-05 NOTE — ED Provider Notes (Signed)
[facility] DEPT Provider Note   CSN: [id number] Arrival date & time: 02/05/17  1647   An emergency department physician performed an initial assessment on this suspected stroke patient at 1648.  History   Chief Complaint Chief Complaint  Patient presents with  . Code Stroke    Paitent coming from [place] where staff saw him normal at 1515hrs.  Patient was seen on the floor, umwitnessed fall  at the next check.    HPI Logan Day is a 83 y.o. male. Chief complaint is "code stroke", right arm weakness, aphasia  HPI 83 year old male with a history of previous strokes. Not currently anticoagulated. Last time known well 1515 p.m. Today.  Per EMS she had an unwitnessed fall. Was found with right arm weakness and inability to speak.  Transferred emergently as code stroke. Seen and evaluated upon arrival and taken emergently to CT scan which demonstrates a large left intracranial hemorrhage.    Past Medical History:  Diagnosis Date  . Angina   . Atrial fibrillation ([place])   . Cancer [facility]    prostate cancer  . Congestive heart failure ([place])   . COPD (chronic obstructive pulmonary disease) ([place])   . Coronary artery disease    Circ stent (2009 cath patent)  . Dementia   . GERD (gastroesophageal reflux disease)   . GI bleed 08/2015  . Hypercholesteremia   . Hyperlipidemia   . Hypertension   . MI (myocardial infarction) ([place])    x 2  . Prostate cancer ([place])   . Shortness of breath dyspnea   . TIA (transient ischemic attack)     Patient Active Problem List   Diagnosis Date Noted  . ICH (intracerebral hemorrhage) ([place]) 02/05/2017  . Aspiration pneumonia ([place]) 11/15/2016  . Small bowel obstruction ([place]) 09/18/2015  . UTI (lower urinary tract infection) 09/18/2015  . SBO (small bowel obstruction) ([place]) 09/18/2015  . Protein-calorie malnutrition, severe 08/27/2015  . GI bleed 08/26/2015  . Symptomatic anemia 08/26/2015  . CKD (chronic kidney disease), stage II  08/26/2015  . Physical deconditioning 08/26/2015  . Protein calorie malnutrition ([place]) 08/26/2015  . Anticoagulated   . Blood loss anemia   . Constipation   . Bleeding gastrointestinal   . Dementia with behavioral disturbance 05/19/2015  . Severe recurrent major depression without psychotic features ([place]) 05/19/2015  . Incontinence of urine 04/23/2015  . Medicare annual wellness visit, subsequent 04/22/2015  . Anxiety and depression 01/22/2015  . On amiodarone therapy 12/11/2014  . Chronic combined systolic and diastolic congestive heart failure ([place])   . PAF (paroxysmal atrial fibrillation) ([place])   . Malnutrition of moderate degree ([place]) 11/30/2014  . Acute on chronic respiratory failure with hypoxia ([place]) 11/29/2014  . CKD (chronic kidney disease) stage 2, GFR 60-89 ml/min 11/29/2014  . Hyperlipidemia 11/29/2014  . 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 Weight loss, unintentional 11/29/2014  . Hard of hearing 11/29/2014  . Essential hypertension 12/18/2013  . Other and unspecified angina pectoris 10/19/2012    Class: Acute  . Atherosclerotic heart disease of native coronary artery with angina pectoris ([place]) 10/19/2012  . Aphasia S/P CVA 07/15/2011    Past Surgical History:  Procedure Laterality Date  . CORONARY ANGIOPLASTY WITH STENT PLACEMENT  10/19/2012   DES  to circumflex  . FRACTURE SURGERY    . LAPAROTOMY N/A 09/23/2015   Procedure: EXPLORATORY LAPAROTOMY;  Surgeon: Georganna Skeans, MD;  Location: [facility];  Service: General;  Laterality: N/A;  . LYSIS OF ADHESION  N/A 09/23/2015   Procedure: LYSIS OF ADHESION ;  Surgeon: Georganna Skeans, MD;  Location: [facility];  Service: General;  Laterality: N/A;  . PERCUTANEOUS CORONARY STENT INTERVENTION (PCI-S) N/A 10/19/2012   Procedure: PERCUTANEOUS CORONARY STENT INTERVENTION (PCI-S);  Surgeon: Sinclair Grooms, MD;  Location: [facility] CATH LAB;  Service: Cardiovascular;  Laterality: N/A;  . prostayectomy         Home  Medications    Prior to Admission medications   Medication Sig Start Date End Date Taking? Authorizing Provider  acetaminophen (MAPAP) 500 MG tablet Take 500 mg by mouth 3 (three) times daily as needed (for pain).   Yes [provider]  albuterol (PROVENTIL HFA;VENTOLIN HFA) 108 (90 Base) MCG/ACT inhaler Inhale 1-2 puffs into the lungs every 6 (six) hours as needed for wheezing or shortness of breath.   Yes [provider]  ALPRAZolam (XANAX) 0.5 MG tablet Take 1 tablet (0.5 mg total) by mouth 2 (two) times daily as needed for anxiety. 09/29/15  Yes Mikhail, Velta Addison, DO  amiodarone (PACERONE) 200 MG tablet Take 1 tablet (200 mg total) by mouth daily. 01/22/15  Yes Belva Crome, MD  aspirin 81 MG chewable tablet Chew 81 mg by mouth daily.   Yes [provider]  cetirizine (ZYRTEC) 5 MG tablet Take 5 mg by mouth at bedtime.   Yes [provider]  ENSURE (ENSURE) Take 237 mLs by mouth at bedtime.    Yes [provider]  ferrous sulfate 325 (65 FE) MG tablet Take 1 tablet (325 mg total) by mouth 2 (two) times daily with a meal. 08/28/15  Yes Ghimire, Henreitta Leber, MD  levothyroxine (SYNTHROID, LEVOTHROID) 75 MCG tablet Take 75 mcg by mouth daily before breakfast.   Yes [provider]  Multiple Vitamin (MULTI VITAMIN DAILY) TABS Take 1 tablet by mouth daily.   Yes [provider]  nitroGLYCERIN (NITROSTAT) 0.4 MG SL tablet Place 0.4 mg under the tongue every 5 (five) minutes x 3 doses as needed for chest pain.    Yes [provider]  pantoprazole (PROTONIX) 40 MG tablet Take 1 tablet (40 mg total) by mouth daily at 12 noon. Patient taking differently: Take 40 mg by mouth daily.  08/28/15  Yes Ghimire, Henreitta Leber, MD  sertraline (ZOLOFT) 100 MG tablet Take 100 mg by mouth every evening.   Yes [provider]  traZODone (DESYREL) 50 MG tablet Take 75 mg by mouth at bedtime.    Yes [provider]  azithromycin  (ZITHROMAX) 250 MG tablet Take 2 tabs on day 1, then take 1 tab every day for 4 days Patient not taking: Reported on 02/05/2017 11/17/16   Patrecia Pour, Christean Grief, MD  predniSONE (DELTASONE) 10 MG tablet Take 4 tablets for 3 days; Take 3 tablets for 4 days; Take 2 tablets for 3 days; Take 1 tablet for 4 days Patient not taking: Reported on 02/05/2017 11/17/16   Patrecia Pour, Christean Grief, MD  saccharomyces boulardii (FLORASTOR) 250 MG capsule Take 1 capsule (250 mg total) by mouth 2 (two) times daily. Patient not taking: Reported on 02/05/2017 11/17/16   Doreatha Lew, MD    Family History Family History  Problem Relation Age of Onset  . Heart disease Mother   . Heart disease Father   . Coronary artery disease Son     Social History Social History  Substance Use Topics  . Smoking status: Former Smoker    Packs/day: 1.00    Years: 40.00  .  Smokeless tobacco: Former Systems developer    Types: Chew     Comment: quit smoking 2002  . Alcohol use No     Allergies   Patient has no known allergies.   Review of Systems Review of Systems  Unable to perform ROS: Mental status change     Physical Exam Updated Vital Signs BP (!) 141/62   Pulse (!) 54   Temp 97.7 F (36.5 C) (Axillary)   Resp 10   Ht 6\' 1"  (1.854 m)   Wt 71.3 kg (157 lb 3 oz)   SpO2 98%   BMI 20.74 kg/m   Physical Exam  Constitutional: He appears distressed.  Alert. Eyes open. Unable to speak. Appears distressed. Restless.  HENT:  Patient had emesis upon arrival. No pooling in the pharynx. He has a gag reflex.  Eyes:  Does not respond to confrontation of the right visual field with apparent hemianopsia. Post 3 mm reactive.  Neck:  No JVD. No carotid bruits.  Cardiovascular:  Sinus rhythm.  Pulmonary/Chest:  Clear bilateral breath sounds. No increased work of breathing. No rhonchi. Well oxygenated.  Abdominal: Soft. Bowel sounds are normal.  Neurological: He is alert.  Flaccid right upper family. Weakened right lower  extremity. Leftward gaze. A aphasia.  Skin: Skin is warm and dry.     ED Treatments / Results  Labs (all labs ordered are listed, but only abnormal results are displayed) Labs Reviewed  CBC - Abnormal; Notable for the following:       Result Value   RBC 4.16 (*)    Hemoglobin 12.5 (*)    All other components within normal limits  DIFFERENTIAL - Abnormal; Notable for the following:    Neutro Abs 8.1 (*)    All other components within normal limits  COMPREHENSIVE METABOLIC PANEL - Abnormal; Notable for the following:    CO2 19 (*)    Glucose, Bld 119 (*)    BUN 21 (*)    Creatinine, Ser 1.80 (*)    Calcium 8.7 (*)    Albumin 3.4 (*)    GFR calc non Af Amer 34 (*)    GFR calc Af Amer 39 (*)    All other components within normal limits  CBG MONITORING, ED - Abnormal; Notable for the following:    Glucose-Capillary 115 (*)    All other components within normal limits  I-STAT CHEM 8, ED - Abnormal; Notable for the following:    BUN 29 (*)    Creatinine, Ser 1.80 (*)    Glucose, Bld 119 (*)    Calcium, Ion 1.09 (*)    All other components within normal limits  MRSA PCR SCREENING  PROTIME-INR  APTT  I-STAT TROPOININ, ED  CBG MONITORING, ED  I-STAT CHEM 8, ED    EKG  EKG Interpretation None       Radiology Ct Head Code Stroke Wo Contrast  Result Date: 02/05/2017 CLINICAL DATA:  Code stroke. RIGHT-sided weakness, last seen normal at 15 30 hours. History of hypertension, atrial fibrillation, hyperlipidemia, dementia, prostate cancer. EXAM: CT HEAD WITHOUT CONTRAST TECHNIQUE: Contiguous axial images were obtained from the base of the skull through the vertex without intravenous contrast. COMPARISON:  CT HEAD August 26, 2015 FINDINGS: BRAIN: 4.2 x 6.1 x 5.2 cm (volume = 70 cm^3) acute LEFT frontoparietal hematoma with surrounding low-density vasogenic edema. Small amount of surrounding subarachnoid hemorrhage. Regional mass effect, 3 mm LEFT-to-RIGHT midline shift with  partially effaced LEFT lateral ventricle, no RIGHT ventricular entrapment. Bilateral  temporoparietal encephalomalacia. Patchy supratentorial white matter hypodensities exclusive of the aforementioned abnormality consistent with mild chronic small vessel ischemic disease. No acute large vascular territory infarct. Basal cisterns are patent. VASCULAR: Mild to moderate calcific atherosclerosis of the carotid siphons. SKULL: No skull fracture. No significant scalp soft tissue swelling. SINUSES/ORBITS: Completely effaced RIGHT maxillary sinus without antral expansion, scattered small mucosal retention cysts with additional mild paranasal sinus mucosal thickening. Mastoid air cells are well aerated. Soft tissue within the external auditory canals compatible with cerumen. The included ocular globes and orbital contents are non-suspicious. OTHER: None. IMPRESSION: 1. Acute large (70 cc) LEFT frontoparietal intraparenchymal hematoma with extension into the subarachnoid trauma space small volume marginal subarachnoid hemorrhage. Regional mass effect with 3 mm RIGHT to LEFT midline shift. No intraventricular extension or entrapment. 2. Chronic changes include remote bilateral MCA territory infarcts. 3. Critical Value/emergent results were called by telephone at the time of interpretation on 02/05/2017 at 5:10 pm to Dr. Roland Rack , who verbally acknowledged these results. Electronically Signed   By: Elon Alas M.D.   On: 02/05/2017 17:13    Procedures Procedures (including critical care time)  Medications Ordered in ED Medications  nicardipine (CARDENE) 20mg  in 0.86% saline 229ml IV infusion (0.1 mg/ml) (0 mg/hr Intravenous Stopped 02/05/17 2022)   stroke: mapping our early stages of recovery book (not administered)  acetaminophen (TYLENOL) tablet 650 mg (not administered)    Or  acetaminophen (TYLENOL) solution 650 mg (not administered)    Or  acetaminophen (TYLENOL) suppository 650 mg (not  administered)  senna-docusate (Senokot-S) tablet 1 tablet (not administered)  pantoprazole (PROTONIX) injection 40 mg (not administered)  ondansetron (ZOFRAN) injection 4 mg (not administered)  labetalol (NORMODYNE,TRANDATE) 5 MG/ML injection (10 mg  Given 02/05/17 1718)  ondansetron (ZOFRAN) injection 4 mg (4 mg Intravenous Given 02/05/17 1713)     Initial Impression / Assessment and Plan / ED Course  I have reviewed the triage vital signs and the nursing notes.  Pertinent labs & imaging results that were available during my care of the patient were reviewed by me and considered in my medical decision making (see chart for details).   patient seen by Dr. Leonel Ramsay of neurology. Dr. Cecil Cobbs Corday this patient's care. Cardizem drip has been ordered. Family has arrived. Per discussion with neurology patient will be treated. Will be DNR/DNI but will undergo blood pressure control and ICU care tonight. Palliative care consult.  Final Clinical Impressions(s) / ED Diagnoses   Final diagnoses:  Stroke (cerebrum) ([place])  Stroke (cerebrum) [facility])    New Prescriptions Current Discharge Medication List       Tanna Furry, MD 02/05/17 2313

## 2017-02-05 NOTE — ED Notes (Signed)
Pt's son [name] asked this Probation officer to remove c-collar.  Informed [name] that I would have to d/w EDP.  D/w Dr. Jeneen Rinks if c-collar could be removed.  Per Dr. Jeneen Rinks, if family wanted comfort measures only the c-collar could be removed otherwise the c-collar had to stay in place until C spine CT completed.  D/w [name] the parameters given for c-collar removal.  [name] stated after speaking to the neurologist that he has decided on comfort measures only.  C-collar removed.  Emotional support provided to [name], family at the bedside and pt.  Dr. Jeneen Rinks is aware of family's decision.  C-spine CT d/c'd.

## 2017-02-05 NOTE — ED Triage Notes (Signed)
Paitent coming from [facility] where staff saw him normal at 1515hrs.  Patient was seen on the floor, umwitnessed fall  at the next check.  Staff called EMS, they responded and said patient had right arm weakness, but both legs were good as well as arm.  EMS also said he had left sided gaze and PERRLA.  According to EMS patient was verbal prior to this and now he is not verbal.  Upon EMS arrival, patient began to vomit.  EMS did place a 20gauge IV to the left forearm.

## 2017-02-05 NOTE — Code Documentation (Signed)
83 y.o. Male with PMHx of TIA, HTN, HLD, MI, prostate Ca, CAD, dementia, CHF and a-fib who was in his normal state of health earlier today at [facility]. Staff last saw him normal around 1515. Around 1600, staff found him unresponsive on the ground, lying on his right side. EMS was notified and called a code stroke. EMS reporting RUE with no movement and aphasic. On arrival to [facility] ED, pt was met at the bridge by the stroke team, airway was cleared, labs drawn and pt taken to CT.  Per radiology report, CT showing acute 70 cc left frontoparietal intraparenchymal hematoma with extension into the subarachnoid space. 3 mm right to left midline shift. Initial BP above IVH parameters. 31m labetalol given followed by a Cardene gtt. NIHSS 25. See EMR for NIHSS and code stroke times. On assessment, patient globally aphasic, fixed leftward gaze, not blinking to threat, unable to follow commands or participate in the exam. LUE moves spontaneously, RUE with slight movement to noxious stimuli, BLE were never witnessed to resist gravity. IV tPA not given d/t IVH. Patient, BP and gtt monitored frequently. ED bedside handoff with ED RN MElaina Pattee

## 2017-02-05 NOTE — Significant Event (Signed)
Called by patient's son regarding wishes for making the patient comfort care. I explained to him that the size of the hematoma, age of the patient, and having ICH score 3 are certainly factors that compromised his potential to survive this event and have meaningful neurological recovery. He said that his father will not like to live a life that is lacking in quality and thus would like [place] to get a palliative medicine consultation. In the meantime, will continue Cardene drip but no follow up imaging as per his son request.  Dorian Pod, MD [facility]

## 2017-02-05 NOTE — H&P (Signed)
Neurology H&P Reason for Consult: Hemorrhage Referring Physician: Alyson Locket  CC: Hemorrhage  History is obtained from: Patient  HPI: Logan Day is a 83 y.o. male with a history of multiple strokes, dementia, atrial fibrillation, not on anticoagulation who presents with ICH. He was last seen well at 3:15 and subsequently was found with right-sided weakness and aphasia. He was brought into the emergency department where a CT revealed IPH.   He was started on Cardene in his blood pressure responded well.  LKW: 3:15 tpa given?: no, IPH ICH Score: 3  ROS: Unable to obtain due to altered mental status.   Past Medical History:  Diagnosis Date  . Angina   . Atrial fibrillation ([place])   . Cancer [facility])    prostate cancer  . Congestive heart failure ([place])   . COPD (chronic obstructive pulmonary disease) ([place])   . Coronary artery disease    Circ stent (2009 cath patent)  . Dementia   . GERD (gastroesophageal reflux disease)   . GI bleed 08/2015  . Hypercholesteremia   . Hyperlipidemia   . Hypertension   . MI (myocardial infarction) ([place])    x 2  . Prostate cancer ([place])   . Shortness of breath dyspnea   . TIA (transient ischemic attack)      Family History  Problem Relation Age of Onset  . Heart disease Mother   . Heart disease Father   . Coronary artery disease Son      Social History:  reports that he has quit smoking. He has a 40.00 pack-year smoking history. He has quit using smokeless tobacco. His smokeless tobacco use included Chew. He reports that he does not drink alcohol or use drugs.   Exam: Current vital signs: BP 123/60   Pulse (!) 57   Resp 15   Ht 6\' 1"  (1.854 m)   Wt 72.5 kg (159 lb 14.4 oz)   SpO2 96%   BMI 21.10 kg/m  Vital signs in last 24 hours: Pulse Rate:  [25-127] 57 (07/07 1800) Resp:  [9-19] 15 (07/07 1800) BP: (123-172)/(60-76) 123/60 (07/07 1800) SpO2:  [95 %-99 %] 96 % (07/07 1800) Weight:  [72.5 kg (159 lb 14.4 oz)] 72.5 kg (159  lb 14.4 oz) (07/07 1725)   Physical Exam  Constitutional: Appears Elderly Psych: Affect appropriate to situation Eyes: No scleral injection HENT: No OP obstrucion Head: Normocephalic.  Cardiovascular: Normal rate and regular rhythm.  Respiratory: Effort normal and breath sounds normal to anterior ascultation GI: Soft.  No distension. There is no tenderness.  Skin: WDI  Neuro: Mental Status: Patient is awake, but aphasic Cranial Nerves: II: Does not blink to threat from either direction, but does fixate and track Pupils are equal, round, and reactive to light.   III,IV, VI: Left gaze preference, does not cross midline to the right V: Facial sensation is decreased on the right VII: Facial movement is weak on the right Motor: He has a severe right hemiparesis with no movement of the right arm and only minimal flicker movement to noxious stimuli in the right leg Sensory: He responds noxious stimuli on the left more than right Cerebellar: He does not perform   I have reviewed labs in epic and the results pertinent to this consultation are: INR 0.99  I have reviewed the images obtained: CT- large IPH  Impression:  83 year old male with large IPH. He had a relatively poor quality of life prior to this, but for now will continue supportive care  given that I suspect that this is not clearly a fatal bleed at this time. I would favor palliative care consultation however for clarification of goals.   Recommendations: 1) Admit to ICU 2) no antiplatelets or anticoagulants 3) blood pressure control with goal systolic 211 - 173, using cardene.  4) Frequent neuro checks 5) If symptoms worsen or there is decreased mental status, repeat stat head CT 6) PT,OT,ST 7) palliative care consultation.    This patient is critically ill and at significant risk of neurological worsening, death and care requires constant monitoring of vital signs, hemodynamics,respiratory and cardiac monitoring,  neurological assessment, discussion with family, other specialists and medical decision making of high complexity. I spent 50 minutes of neurocritical care time  in the care of  this patient.  Roland Rack, MD [facility] [phone]  If 7pm- 7am, please page neurology on call as listed in [place]. 02/05/2017  7:25 PM

## 2017-02-06 DIAGNOSIS — Z515 Encounter for palliative care: Secondary | ICD-10-CM

## 2017-02-06 MED ORDER — SODIUM CHLORIDE 0.9% FLUSH
3.0000 mL | Freq: Two times a day (BID) | INTRAVENOUS | Status: DC
  Administered 2017-02-06 – 2017-02-07 (×2): 3 mL via INTRAVENOUS

## 2017-02-06 MED ORDER — LORAZEPAM 1 MG PO TABS: 1.0000 mg | ORAL_TABLET | ORAL | Status: DC | PRN

## 2017-02-06 MED ORDER — MORPHINE SULFATE (PF) 4 MG/ML IV SOLN
1.0000 mg | INTRAVENOUS | Status: DC | PRN
  Administered 2017-02-06 – 2017-02-07 (×5): 1 mg via INTRAVENOUS
  Filled 2017-02-06 (×5): qty 1

## 2017-02-06 MED ORDER — BIOTENE DRY MOUTH MT LIQD: 15.0000 mL | OROMUCOSAL | Status: DC | PRN

## 2017-02-06 MED ORDER — POLYVINYL ALCOHOL 1.4 % OP SOLN
1.0000 [drp] | Freq: Four times a day (QID) | OPHTHALMIC | Status: DC | PRN
  Filled 2017-02-06: qty 15

## 2017-02-06 MED ORDER — SODIUM CHLORIDE 0.9% FLUSH: 3.0000 mL | INTRAVENOUS | Status: DC | PRN

## 2017-02-06 MED ORDER — SODIUM CHLORIDE 0.9 % IV SOLN: 250.0000 mL | INTRAVENOUS | Status: DC | PRN

## 2017-02-06 MED ORDER — MORPHINE SULFATE (CONCENTRATE) 10 MG/0.5ML PO SOLN: 5.0000 mg | ORAL | Status: DC | PRN

## 2017-02-06 MED ORDER — RISPERIDONE 0.5 MG PO TBDP
0.5000 mg | ORAL_TABLET | Freq: Two times a day (BID) | ORAL | Status: DC
  Filled 2017-02-06 (×3): qty 1

## 2017-02-06 MED ORDER — ATROPINE SULFATE 1 % OP SOLN
4.0000 [drp] | OPHTHALMIC | Status: DC | PRN
  Filled 2017-02-06: qty 2

## 2017-02-06 MED ORDER — LORAZEPAM 2 MG/ML PO CONC: 1.0000 mg | ORAL | Status: DC | PRN

## 2017-02-06 MED ORDER — LORAZEPAM 2 MG/ML IJ SOLN
1.0000 mg | INTRAMUSCULAR | Status: DC | PRN
  Administered 2017-02-06 – 2017-02-07 (×4): 1 mg via INTRAVENOUS
  Filled 2017-02-06 (×4): qty 1

## 2017-02-06 NOTE — Progress Notes (Addendum)
OT Cancellation Note  Patient Details Name: Logan Day MRN: [id number] DOB: 08/13/1932   Cancelled Treatment:    Reason Eval/Treat Not Completed: Patient not medically ready. Pt with active bedrest orders. Additionally, note pending palliative consult and family discussion concerning comfort care measures. OT will check back as able and proceed with evaluation depending on goals of care. Thank you for this referral!  Norman Herrlich, MS OTR/L  Pager: [place] 02/06/2017, 7:14 AM

## 2017-02-06 NOTE — Progress Notes (Signed)
Patient was found on floor. No visible injury noted, neuro exam unchanged, no signs of distress or pain noted. MD and family notified.

## 2017-02-06 NOTE — Progress Notes (Signed)
SLP Cancellation Note  Patient Details Name: Logan Day MRN: [id number] DOB: [birth year]   Cancelled treatment:       Reason Eval/Treat Not Completed: Other (comment) (Per MD patient is going comfort care and order was DC'd.  )   Shelly Flatten, MA, [facility] SLP [phone] Lamar Sprinkles 02/06/2017, 9:29 AM

## 2017-02-06 NOTE — Progress Notes (Signed)
Received to 6n17 at this time. Vomited sml amt after being moved into new bed. Family at bedside. Will continue to monitor.

## 2017-02-06 NOTE — Progress Notes (Signed)
Pt unable to participate in [place] discussion 2/2 ICH. He is non-verbal. Called pt's son, Harlis Champoux @ [phone]. Plan is to meet 7/9 at 0930 Thank you, Romona Curls, ANP

## 2017-02-06 NOTE — Plan of Care (Signed)
Problem: Coping: Goal: Ability to identify appropriate support needs will improve Outcome: Progressing Discussed support needs with family. Patient unable to participate in discussion

## 2017-02-06 NOTE — Progress Notes (Signed)
STROKE TEAM PROGRESS NOTE   HISTORY OF PRESENT ILLNESS (per record) Logan Day is a 83 y.o. male with a history of multiple strokes, dementia, atrial fibrillation, not on anticoagulation who presents with ICH. He was last seen well at 3:15 and subsequently was found with right-sided weakness and aphasia. He was brought into the emergency department where a CT revealed IPH.  He was started on Cardene in his blood pressure responded well.  LKW: 3:15 tpa given?: no, IPH ICH Score: 3   SUBJECTIVE (INTERVAL HISTORY) His son is at the bedside.  Patient has prior history of stroke with some residual aphasia hemiparesis as well as moderate dementia. He lives in assisted living. He sees Dr. Corwin Levins i in our office.   OBJECTIVE Temp:  [97.7 F (36.5 C)-99.1 F (37.3 C)] 99.1 F (37.3 C) (07/08 0400) Pulse Rate:  [25-127] 60 (07/08 0630) Cardiac Rhythm: Sinus bradycardia;Normal sinus rhythm (07/07 2100) Resp:  [9-22] 13 (07/08 0630) BP: (118-172)/(47-76) 118/52 (07/08 0630) SpO2:  [82 %-100 %] 95 % (07/08 0630) Weight:  [71.3 kg (157 lb 3 oz)-72.5 kg (159 lb 14.4 oz)] 71.3 kg (157 lb 3 oz) (07/07 2100)  CBC:  Recent Labs Lab 02/05/17 1657 02/05/17 1712  WBC  --  10.2  NEUTROABS  --  8.1*  HGB 13.9 12.5*  HCT 41.0 39.1  MCV  --  94.0  PLT  --  161    Basic Metabolic Panel:  Recent Labs Lab 02/05/17 1657 02/05/17 1712  NA 140 137  K 3.8 3.7  CL 105 106  CO2  --  19*  GLUCOSE 119* 119*  BUN 29* 21*  CREATININE 1.80* 1.80*  CALCIUM  --  8.7*    Lipid Panel:    Component Value Date/Time   CHOL 131 04/16/2015 1016   TRIG 81.0 04/16/2015 1016   HDL 50.30 04/16/2015 1016   CHOLHDL 3 04/16/2015 1016   VLDL 16.2 04/16/2015 1016   LDLCALC 65 04/16/2015 1016   HgbA1c:  Lab Results  Component Value Date   HGBA1C 5.7 05/05/2015   Urine Drug Screen:    Component Value Date/Time   LABOPIA NONE DETECTED 05/18/2015 1600   COCAINSCRNUR NONE DETECTED 05/18/2015 1600    LABBENZ NONE DETECTED 05/18/2015 1600   AMPHETMU NONE DETECTED 05/18/2015 1600   THCU NONE DETECTED 05/18/2015 1600   LABBARB NONE DETECTED 05/18/2015 1600    Alcohol Level     Component Value Date/Time   ETH <5 05/18/2015 1443    IMAGING  Ct Head Code Stroke Wo Contrast 02/05/2017 1. Acute large (70 cc) LEFT frontoparietal intraparenchymal hematoma with extension into the subarachnoid trauma space small volume marginal subarachnoid hemorrhage. Regional mass effect with 3 mm RIGHT to LEFT midline shift. No intraventricular extension or entrapment.  2. Chronic changes include remote bilateral MCA territory infarcts.     PHYSICAL EXAM Frail elderly Caucasian male not in distress. . Afebrile. Head is nontraumatic. Neck is supple without bruit.    Cardiac exam no murmur or gallop. Lungs are clear to auscultation. Distal pulses are well felt.  Neurological Exam :  Stuporous and eyes closed. Opens eyes partially to sternal rub but is globally aphasic and not speaking or following any commands. Left gaze preference. Unable to look to the right. Does not blink to threat on both sides. Pupils irregular but equal reactive. Fundi were not visualized. Does not blink to threat on either side. Right lower facial weakness. Tongue midline. Motor system exam patient has purposeful  movements on the left side against gravity. He will withdraw the right lower extremity slightly to painful stimuli but does not move the right upper extremity even to pain. Tone is diminished on the right. Reflexes are normal on the left and depressed on the right. Right plantar is upgoing left is downgoing.   ASSESSMENT/PLAN Mr. Logan Day is a 83 y.o. male with history of multiple strokes, TIA, prostate cancer,  CAD, MI, dementia, hyperlipidemia, hypertension, previous GI bleed, dementia, COPD, congestive heart failure, atrial fibrillation, not on anticoagulation  presenting with ICH, right-sided weakness and aphasia.   He did not receive IV t-PA due to [place].  LEFT frontoparietal intraparenchymal hematoma: Unknown etiology.  Resultant  global aphasia and right hemiplegia  CT head - Acute large (70 cc) LEFT frontoparietal intraparenchymal hematoma with extension into the subarachnoid trauma space small volume marginal subarachnoid hemorrhage. Regional mass effect with 3 mm Rt to Lt midline shift  MRI head - not performed  MRA head - not performed  Carotid Doppler - not performed  2D Echo - not performed  LDL - not performed  HgbA1c - not performed  VTE prophylaxis - SCDs  Diet NPO time specified  aspirin 81 mg daily prior to admission, now on No antithrombotic secondary to [place]  Ongoing aggressive stroke risk factor management  Therapy recommendations: pending  Disposition:  Pending  Hypertension  Stable  Long-term BP goal normotensive  Hyperlipidemia  Home meds:  No lipid lowering medications prior to admission    Other Stroke Risk Factors  Advanced age  Former cigarette smoker - quit  Hx stroke/TIA  Coronary artery disease   Other Active Problems  Renal insufficiency - BUN 21 / creatinine 1.80  Low-grade fever - 99.2 axillary  PLAN  Comfort care measures to be discussed  Palliative care consult    Hospital day # 1  I have personally examined this patient, reviewed notes, independently viewed imaging studies, participated in medical decision making and plan of care.ROS completed by me personally and pertinent positives fully documented  I have made any additions or clarifications directly to the above note. Patient has presented with a large parenchymal left brain hemorrhage and has had baseline moderate to severe dementia with progressive disease dual deficits from a previous stroke. His prognosis is quite poor and chances of survival with meaningful improvement on not great. I had a long discussion with the patient's son at the bedside. He feels clearly that  patient would not have wanted prolonged support and living in a nursing home which seems unavoidable. He agrees to comfort care , DNR and withdrawal of care. Will discontinue ongoing workup and evaluation and make him comfort care only. This patient is critically ill and at significant risk of neurological worsening, death and care requires constant monitoring of vital signs, hemodynamics,respiratory and cardiac monitoring, extensive review of multiple databases, frequent neurological assessment, discussion with family, other specialists and medical decision making of high complexity.I have made any additions or clarifications directly to the above note.This critical care time does not reflect procedure time, or teaching time or supervisory time of PA/NP/Med Resident etc but could involve care discussion time.  I spent 40 minutes of neurocritical care time  in the care of  this patient.     Antony Contras, MD Medical Director [facility] Pager: [phone] 02/06/2017 12:19 PM   To contact Stroke Continuity provider, please refer to [email or web address] After hours, contact General Neurology

## 2017-02-06 NOTE — Progress Notes (Signed)
Spoke with Logan Day. He gave his permission to turn camera on.

## 2017-02-07 DIAGNOSIS — Z515 Encounter for palliative care: Secondary | ICD-10-CM

## 2017-02-07 DIAGNOSIS — I610 Nontraumatic intracerebral hemorrhage in hemisphere, subcortical: Secondary | ICD-10-CM

## 2017-02-07 MED ORDER — HYDROMORPHONE HCL 1 MG/ML IJ SOLN
1.0000 mg | INTRAMUSCULAR | Status: DC | PRN
  Administered 2017-02-07: 1 mg via INTRAVENOUS
  Filled 2017-02-07: qty 1

## 2017-02-07 MED ORDER — MORPHINE SULFATE 20 MG/5ML PO SOLN: 5.0000 mg | ORAL | 0 refills | Status: AC | PRN

## 2017-02-07 MED ORDER — LORAZEPAM 2 MG/ML PO CONC: 1.0000 mg | ORAL | 0 refills | Status: AC | PRN

## 2017-02-07 MED ORDER — HYDROMORPHONE HCL 1 MG/ML IJ SOLN
0.5000 mg | Freq: Once | INTRAMUSCULAR | Status: AC
  Administered 2017-02-07: 0.5 mg via INTRAVENOUS
  Filled 2017-02-07: qty 0.5

## 2017-02-07 MED ORDER — HYDROMORPHONE HCL 1 MG/ML IJ SOLN
0.5000 mg | INTRAMUSCULAR | Status: DC | PRN
  Administered 2017-02-07: 0.5 mg via INTRAVENOUS
  Filled 2017-02-07: qty 0.5

## 2017-02-07 MED ORDER — ACETAMINOPHEN 650 MG RE SUPP: 650.0000 mg | RECTAL | 0 refills | Status: AC | PRN

## 2017-02-07 NOTE — Progress Notes (Signed)
CSW received message from palliative care physician informed CSW of family preference for transfer to [place] has already made referral to [facility] and liaison to come out and assess for admission  CSW will continue to follow and assist with transfer to [facility] if accepted  Jorge Ny, [employer] Social Worker [phone]

## 2017-02-07 NOTE — Discharge Summary (Signed)
Stroke Discharge Summary  Patient ID: Logan Day   MRN: [id number]      DOB: [birth year]  Date of Admission: 02/05/2017 Date of Discharge: 02/07/2017  Attending Physician:  Garvin Fila, MD, Stroke MD Consultant(s):   Treatment Team:  Stroke, Md, MD; Consults:  Palliative Patient's PCP:  System, Pcp Not In  DISCHARGE DIAGNOSIS: Principal Problem:   ICH (intracerebral hemorrhage) ([place]) - acute large left frontoparietal hematroma with subarachnoid extension and 3mm rightward shift Active Problems:   Palliative care by specialist   Terminal care   Past Medical History:  Diagnosis Date  . Angina   . Atrial fibrillation ([place])   . Cancer [facility])    prostate cancer  . Congestive heart failure ([place])   . COPD (chronic obstructive pulmonary disease) ([place])   . Coronary artery disease    Circ stent (2009 cath patent)  . Dementia   . GERD (gastroesophageal reflux disease)   . GI bleed 08/2015  . Hypercholesteremia   . Hyperlipidemia   . Hypertension   . MI (myocardial infarction) ([place])    x 2  . Prostate cancer ([place])   . Shortness of breath dyspnea   . TIA (transient ischemic attack)    Past Surgical History:  Procedure Laterality Date  . CORONARY ANGIOPLASTY WITH STENT PLACEMENT  10/19/2012   DES  to circumflex  . FRACTURE SURGERY    . LAPAROTOMY N/A 09/23/2015   Procedure: EXPLORATORY LAPAROTOMY;  Surgeon: Georganna Skeans, MD;  Location: [place];  Service: General;  Laterality: N/A;  . LYSIS OF ADHESION N/A 09/23/2015   Procedure: LYSIS OF ADHESION ;  Surgeon: Georganna Skeans, MD;  Location: [place];  Service: General;  Laterality: N/A;  . PERCUTANEOUS CORONARY STENT INTERVENTION (PCI-S) N/A 10/19/2012   Procedure: PERCUTANEOUS CORONARY STENT INTERVENTION (PCI-S);  Surgeon: Sinclair Grooms, MD;  Location: [facility] CATH LAB;  Service: Cardiovascular;  Laterality: N/A;  . prostayectomy      Allergies as of 02/07/2017   No Known Allergies     Medication List    STOP taking  these medications   albuterol 108 (90 Base) MCG/ACT inhaler Commonly known as:  PROVENTIL HFA;VENTOLIN HFA   ALPRAZolam 0.5 MG tablet Commonly known as:  XANAX   amiodarone 200 MG tablet Commonly known as:  PACERONE   aspirin 81 MG chewable tablet   azithromycin 250 MG tablet Commonly known as:  ZITHROMAX   cetirizine 5 MG tablet Commonly known as:  ZYRTEC   ENSURE   ferrous sulfate 325 (65 FE) MG tablet   levothyroxine 75 MCG tablet Commonly known as:  SYNTHROID, LEVOTHROID   MAPAP 500 MG tablet Generic drug:  acetaminophen Replaced by:  acetaminophen 650 MG suppository   MULTI VITAMIN DAILY Tabs   NITROSTAT 0.4 MG SL tablet Generic drug:  nitroGLYCERIN   pantoprazole 40 MG tablet Commonly known as:  PROTONIX   predniSONE 10 MG tablet Commonly known as:  DELTASONE   saccharomyces boulardii 250 MG capsule Commonly known as:  FLORASTOR   sertraline 100 MG tablet Commonly known as:  ZOLOFT   traZODone 50 MG tablet Commonly known as:  DESYREL     TAKE these medications   acetaminophen 650 MG suppository Commonly known as:  TYLENOL Place 1 suppository (650 mg total) rectally every 4 (four) hours as needed for mild pain. Replaces:  MAPAP 500 MG tablet   LORazepam 2 MG/ML concentrated solution Commonly known as:  ATIVAN Place 0.5 mLs (1 mg  total) under the tongue every 4 (four) hours as needed for anxiety.   morphine 20 MG/5ML solution Take 1.3 mLs (5.2 mg total) by mouth every 2 (two) hours as needed for pain.       LABORATORY STUDIES CBC    Component Value Date/Time   WBC 10.2 02/05/2017 1712   RBC 4.16 (L) 02/05/2017 1712   HGB 12.5 (L) 02/05/2017 1712   HCT 39.1 02/05/2017 1712   PLT 189 02/05/2017 1712   MCV 94.0 02/05/2017 1712   MCH 30.0 02/05/2017 1712   MCHC 32.0 02/05/2017 1712   RDW 15.2 02/05/2017 1712   LYMPHSABS 1.2 02/05/2017 1712   MONOABS 0.5 02/05/2017 1712   EOSABS 0.3 02/05/2017 1712   BASOSABS 0.0 02/05/2017 1712    CMP    Component Value Date/Time   NA 137 02/05/2017 1712   K 3.7 02/05/2017 1712   CL 106 02/05/2017 1712   CO2 19 (L) 02/05/2017 1712   GLUCOSE 119 (H) 02/05/2017 1712   BUN 21 (H) 02/05/2017 1712   CREATININE 1.80 (H) 02/05/2017 1712   CALCIUM 8.7 (L) 02/05/2017 1712   PROT 7.2 02/05/2017 1712   ALBUMIN 3.4 (L) 02/05/2017 1712   AST 28 02/05/2017 1712   ALT 20 02/05/2017 1712   ALKPHOS 72 02/05/2017 1712   BILITOT 0.7 02/05/2017 1712   GFRNONAA 34 (L) 02/05/2017 1712   GFRAA 39 (L) 02/05/2017 1712   COAGS Lab Results  Component Value Date   INR 0.99 02/05/2017   INR 1.31 08/28/2015   INR 3.20 (H) 08/26/2015   Lipid Panel    Component Value Date/Time   CHOL 131 04/16/2015 1016   TRIG 81.0 04/16/2015 1016   HDL 50.30 04/16/2015 1016   CHOLHDL 3 04/16/2015 1016   VLDL 16.2 04/16/2015 1016   LDLCALC 65 04/16/2015 1016   HgbA1C  Lab Results  Component Value Date   HGBA1C 5.7 05/05/2015   Urinalysis    Component Value Date/Time   COLORURINE YELLOW 09/18/2015 1856   APPEARANCEUR TURBID (A) 09/18/2015 1856   LABSPEC 1.012 09/18/2015 1856   PHURINE 8.0 09/18/2015 1856   GLUCOSEU NEGATIVE 09/18/2015 1856   HGBUR NEGATIVE 09/18/2015 1856   BILIRUBINUR NEGATIVE 09/18/2015 1856   BILIRUBINUR 2+ 06/10/2015 1444   KETONESUR 15 (A) 09/18/2015 1856   PROTEINUR NEGATIVE 09/18/2015 1856   UROBILINOGEN 2.0 06/10/2015 1444   UROBILINOGEN 1.0 05/18/2015 1641   NITRITE POSITIVE (A) 09/18/2015 1856   LEUKOCYTESUR TRACE (A) 09/18/2015 1856   Urine Drug Screen     Component Value Date/Time   LABOPIA NONE DETECTED 05/18/2015 1600   COCAINSCRNUR NONE DETECTED 05/18/2015 1600   LABBENZ NONE DETECTED 05/18/2015 1600   AMPHETMU NONE DETECTED 05/18/2015 1600   THCU NONE DETECTED 05/18/2015 1600   LABBARB NONE DETECTED 05/18/2015 1600    Alcohol Level    Component Value Date/Time   ETH <5 05/18/2015 1443     SIGNIFICANT DIAGNOSTIC STUDIES Ct Head Code Stroke Wo  Contrast 02/05/2017 1. Acute large (70 cc) LEFT frontoparietal intraparenchymal hematoma with extension into the subarachnoid trauma space small volume marginal subarachnoid hemorrhage. Regional mass effect with 3 mm RIGHT to LEFT midline shift. No intraventricular extension or entrapment.  2. Chronic changes include remote bilateral MCA territory infarcts.    HISTORY OF PRESENT ILLNESS Logan Day a 83 y.o.malewith a history of multiple strokes, dementia, atrial fibrillation, not on anticoagulation who presents with ICH. He was last seen well at 3:15 and subsequently was found with right-sided  weakness and aphasia. He was brought into the emergency department where a CT revealed IPH.  He was started on Cardene in his blood pressure responded well.  LKW: 3:15 tpa given?: no, IPH ICH Score: 3    HOSPITAL COURSE Logan Day is a 83 y.o. male with history of multiple strokes, TIA, prostate cancer,  CAD, MI, dementia, hyperlipidemia, hypertension, previous GI bleed, dementia, COPD, congestive heart failure, atrial fibrillation, not on anticoagulation  presenting Logan Day, right-sided weakness and aphasia.  He did not receive IV t-PA due to [place].  LEFT frontoparietal intraparenchymal hematoma: Unknown etiology.  Resultant  global aphasia and right hemiplegia  CT head - Acute large (70 cc) LEFT frontoparietal intraparenchymal hematoma with extension into the subarachnoid trauma space small volume marginal subarachnoid hemorrhage. Regional mass effect with 3 mm Rt to Lt midline shift  MRI head - not performed  MRA head - not performed  Carotid Doppler - not performed  2D Echo - not performed  LDL - not performed  HgbA1c - not performed  VTE prophylaxis - SCDs  Diet NPO time specified  aspirin 81 mg daily prior to admission, now on No antithrombotic secondary to [place]  Ongoing aggressive stroke risk factor management  Therapy recommendations:  pending  Disposition:  Pending  Hypertension  Stable  Long term goal BP goal normotensive  Hyperlipidemia  Home meds:  No lipid lowering medications prior to admission  Other Stroke Risk Factors  Advanced age  Former cigarette smoker - quit  Hx stroke/TIA  Coronary artery disease  Other Active Problems  Renal insufficiency - BUN 21 / creatinine 1.80  Low-grade fever - 99.2 axillary  PLAN  Comfort care measures to be discussed  Palliative care consult  DISCHARGE EXAM Blood pressure (!) 152/72, pulse 64, temperature 98.8 F (37.1 C), temperature source Axillary, resp. rate 18, height 6\' 1"  (1.854 m), weight 71.3 kg (157 lb 3 oz), SpO2 91 %. Frail elderly Caucasian male not in distress.  Afebrile. Head is nontraumatic. Neck is supple without bruit.    Cardiac exam no murmur or gallop. Lungs are clear to auscultation. Distal pulses are well felt.  Neurological Exam :  Stuporous and eyes closed. Opens eyes partially to sternal rub but is globally aphasic and not speaking or following any commands. Left gaze preference. Unable to look to the right. Does not blink to threat on both sides. Pupils irregular but equal reactive. Fundi were not visualized. Does not blink to threat on either side. Right lower facial weakness. Tongue midline. Motor system exam patient has purposeful movements on the left side against gravity. He will withdraw the right lower extremity slightly to painful stimuli but does not move the right upper extremity even to pain. Tone is diminished on the right. Reflexes are normal on the left and depressed on the right. Right plantar is upgoing left is downgoing.   Discharge Diet   Diet NPO time specified liquids  DISCHARGE PLAN  Disposition:  Discharge to hospice [facility])  No antithrombotic for secondary stroke prevention.  Logan Rings, NP   25 minutes were spent preparing discharge. I have personally examined this patient,  reviewed notes, independently viewed imaging studies, participated in medical decision making and plan of care.ROS completed by me personally and pertinent positives fully documented  I have made any additions or clarifications directly to the above note. Agree with note above.    Antony Contras, MD Medical Director [facility] Pager: [phone] 02/07/2017 2:17 PM

## 2017-02-07 NOTE — Clinical Social Work Note (Addendum)
Pt will be transported to [facility]--facility prepared. CSW has set up transport at this time. RN to call report to [phone].  [place], [place]

## 2017-02-07 NOTE — Consult Note (Signed)
[facility] [employer] Received request from [place] for family interest in [facility]. Appreciate report from Charlynn Court, NP. Met with sons [name] and Kasandra Knudsen to explain services and confirm interest. [name] completed paper work. Dr. Orpah Melter to assume care per family preference.   Please fax discharge summary to [phone].  RN please call report to [phone].  Thank you,  Erling Conte, LCSW [phone]

## 2017-02-07 NOTE — Progress Notes (Signed)
Patient transported to [facility], family at bedside, verified all belongings sent with patient, report called to [facility].

## 2017-02-07 NOTE — Progress Notes (Signed)
Patient's son refused bed transfer of patient to low bed since patient is asleep.  Will try to ask son later.

## 2017-02-07 NOTE — Care Management Note (Signed)
Case Management Note  Patient Details  Name: Logan Day MRN: [id number] Date of Birth: November 08, 1932  Subjective/Objective:   ICH                 Action/Plan: Discharge Planning: Chart reviewed. Scheduled dc to [facility]. CSW following for placement.     Expected Discharge Date:  02/07/2017                Expected Discharge Plan:  [place]  In-House Referral:  Clinical Social Work  Discharge planning Services  CM Consult  Post Acute Care Choice:  NA Choice offered to:  NA  DME Arranged:  N/A DME Agency:  NA  HH Arranged:  NA HH Agency:  NA  Status of Service:  Completed, signed off  If discussed at [employer] of Stay Meetings, dates discussed:    Additional Comments:  Erenest Rasher, RN 02/07/2017, 12:24 PM

## 2017-02-07 NOTE — Consult Note (Signed)
Consultation Note Date: 02/07/2017   Patient Name: Logan Day  DOB: [birth year]  MRN: [id number]  Age / Sex: 83 y.o., male  PCP: System, Pcp Not In Referring Physician: Garvin Fila, MD  Reason for Consultation: Disposition, Establishing goals of care, Psychosocial/spiritual support and Terminal Care  HPI/Patient Profile: 83 y.o. male  with past medical history of dementia, prior strokes, TIA, CHF, CAD, MI,  A. Fib not on anticoagulation, HTN, HLD, and COPD. He was found on the floor at [facility], and EMS on arrival noted right arm weakness and newly non-verbal. He presented to the ED and was admitted on 02/05/2017. CT head revealed large ICH. Neurology discussed the results of the scan and prognosis with family. They elected to pursue full comfort measures with DNR status. Palliative consulted to assist in EOL symptom management and support discharge planning.  Clinical Assessment and Goals of Care: Mr. Belair is unable to participate in a goals of care conversation. He is unresponsive in bed. I met with his two sons at his bedside. They both agree with comfort measures. I discussed the role of different medications to manage his symptoms, as well as how I am monitoring for efficacy. In terms of discharge, they had already spoken with Dr. Leonie Man and wished to pursue residential hospice at [facility]. I feel this is a very appropriate and supported them in this decision.   Primary Decision Maker NEXT OF KIN    SUMMARY OF RECOMMENDATIONS    DNR, full comfort measures. Plan for transition to [facility] ASAP  Medication adjusted for comfort; given renal function I've changed Morphine to Dilaudid to avoid risk of toxic metabolite build-up. He may need a continuous drip. He is no longer able to take any PO pills  Code Status/Advance Care Planning:  DNR  Palliative Prophylaxis:   Frequent Pain Assessment and Oral Care  Additional  Recommendations (Limitations, Scope, Preferences):  Full Comfort Care  Psycho-social/Spiritual:   Desire for further Chaplaincy support:no  Additional Recommendations: Education on Hospice  Prognosis:   < 2 weeks. Pt with large ICH and now unresponsive with no PO intake. I suspect he has days to a week remaining.   Discharge Planning: Hospice facility      Primary Diagnoses: Present on Admission: . ICH (intracerebral hemorrhage) ([place])   I have reviewed the medical record, interviewed the patient and family, and examined the patient. The following aspects are pertinent.  Past Medical History:  Diagnosis Date  . Angina   . Atrial fibrillation ([place])   . Cancer [facility])    prostate cancer  . Congestive heart failure ([place])   . COPD (chronic obstructive pulmonary disease) ([place])   . Coronary artery disease    Circ stent (2009 cath patent)  . Dementia   . GERD (gastroesophageal reflux disease)   . GI bleed 08/2015  . Hypercholesteremia   . Hyperlipidemia   . Hypertension   . MI (myocardial infarction) ([place])    x 2  . Prostate cancer ([place])   . Shortness of breath dyspnea   . TIA (transient ischemic attack)    Social History   Social History  . Marital status: Widowed    Spouse name: N/A  . Number of children: N/A  . Years of education: N/A   Social History Main Topics  . Smoking status: Former Smoker    Packs/day: 1.00    Years: 40.00  . Smokeless tobacco: Former Systems developer    Types: Chew     Comment:  quit smoking 2002  . Alcohol use No  . Drug use: No  . Sexual activity: No   Other Topics Concern  . None   Social History Narrative   Widower.   Retired. Worked for [employer].   Working outdoors.      Family History  Problem Relation Age of Onset  . Heart disease Mother   . Heart disease Father   . Coronary artery disease Son    Scheduled Meds: . risperiDONE  0.5 mg Oral BID  . sodium chloride flush  3 mL Intravenous Q12H   Continuous Infusions: .  sodium chloride     PRN Meds:.sodium chloride, [DISCONTINUED] acetaminophen **OR** [DISCONTINUED] acetaminophen (TYLENOL) oral liquid 160 mg/5 mL **OR** acetaminophen, antiseptic oral rinse, atropine, HYDROmorphone (DILAUDID) injection, [DISCONTINUED] LORazepam **OR** LORazepam **OR** LORazepam, ondansetron (ZOFRAN) IV, polyvinyl alcohol, sodium chloride flush No Known Allergies   Review of Systems  Unable to perform ROS  Physical Exam  Constitutional:  Frail older man in bed  HENT:  Head: Normocephalic and atraumatic.  Mouth/Throat: Mucous membranes are dry.  Cardiovascular: Normal rate.  An irregularly irregular rhythm present.  Pulmonary/Chest:  Rapid shallow breaths. No wheezing or rhonchi. Periods of gasping and apnea.   Abdominal: Soft. Normal appearance and bowel sounds are normal.  Musculoskeletal: He exhibits no edema.  Neurological:  Slight withdrawal to pain on left side, none on right. Eyes remain closed and he is unresponsive to verbal stimuli.   Skin: Skin is warm and dry. There is pallor.   Vital Signs: BP (!) 152/72 (BP Location: Right Arm)   Pulse 64   Temp 98.8 F (37.1 C) (Axillary)   Resp 18   Ht 6' 1"  (1.854 m)   Wt 71.3 kg (157 lb 3 oz)   SpO2 91%   BMI 20.74 kg/m  Pain Assessment: PAINAD     SpO2: SpO2: 91 % O2 Device:SpO2: 91 % O2 Flow Rate: .O2 Flow Rate (L/min): 2 L/min  IO: Intake/output summary:  Intake/Output Summary (Last 24 hours) at 02/07/17 0947 Last data filed at 02/07/17 0916  Gross per 24 hour  Intake                0 ml  Output             1025 ml  Net            -1025 ml    LBM: Last BM Date: 02/04/17 Baseline Weight: Weight: 72.5 kg (159 lb 14.4 oz) Most recent weight: Weight: 71.3 kg (157 lb 3 oz)     Palliative Assessment/Data: PPS 10%    Time Total: 30 minutes Greater than 50%  of this time was spent counseling and coordinating care related to the above assessment and plan.  Signed by: Charlynn Court, NP Palliative  Medicine Team Pager # [phone] (M-F 7a-5p) Team Phone # [phone] (Nights/Weekends)

## 2017-02-16 IMAGING — CR DG CHEST 2V
3 series · 3 of 3 positions shown · non-contrast
Comparison: 05/19/2015

CLINICAL DATA: Headache, prostate cancer, pale

EXAM:
CHEST  2 VIEW

[chest lat]
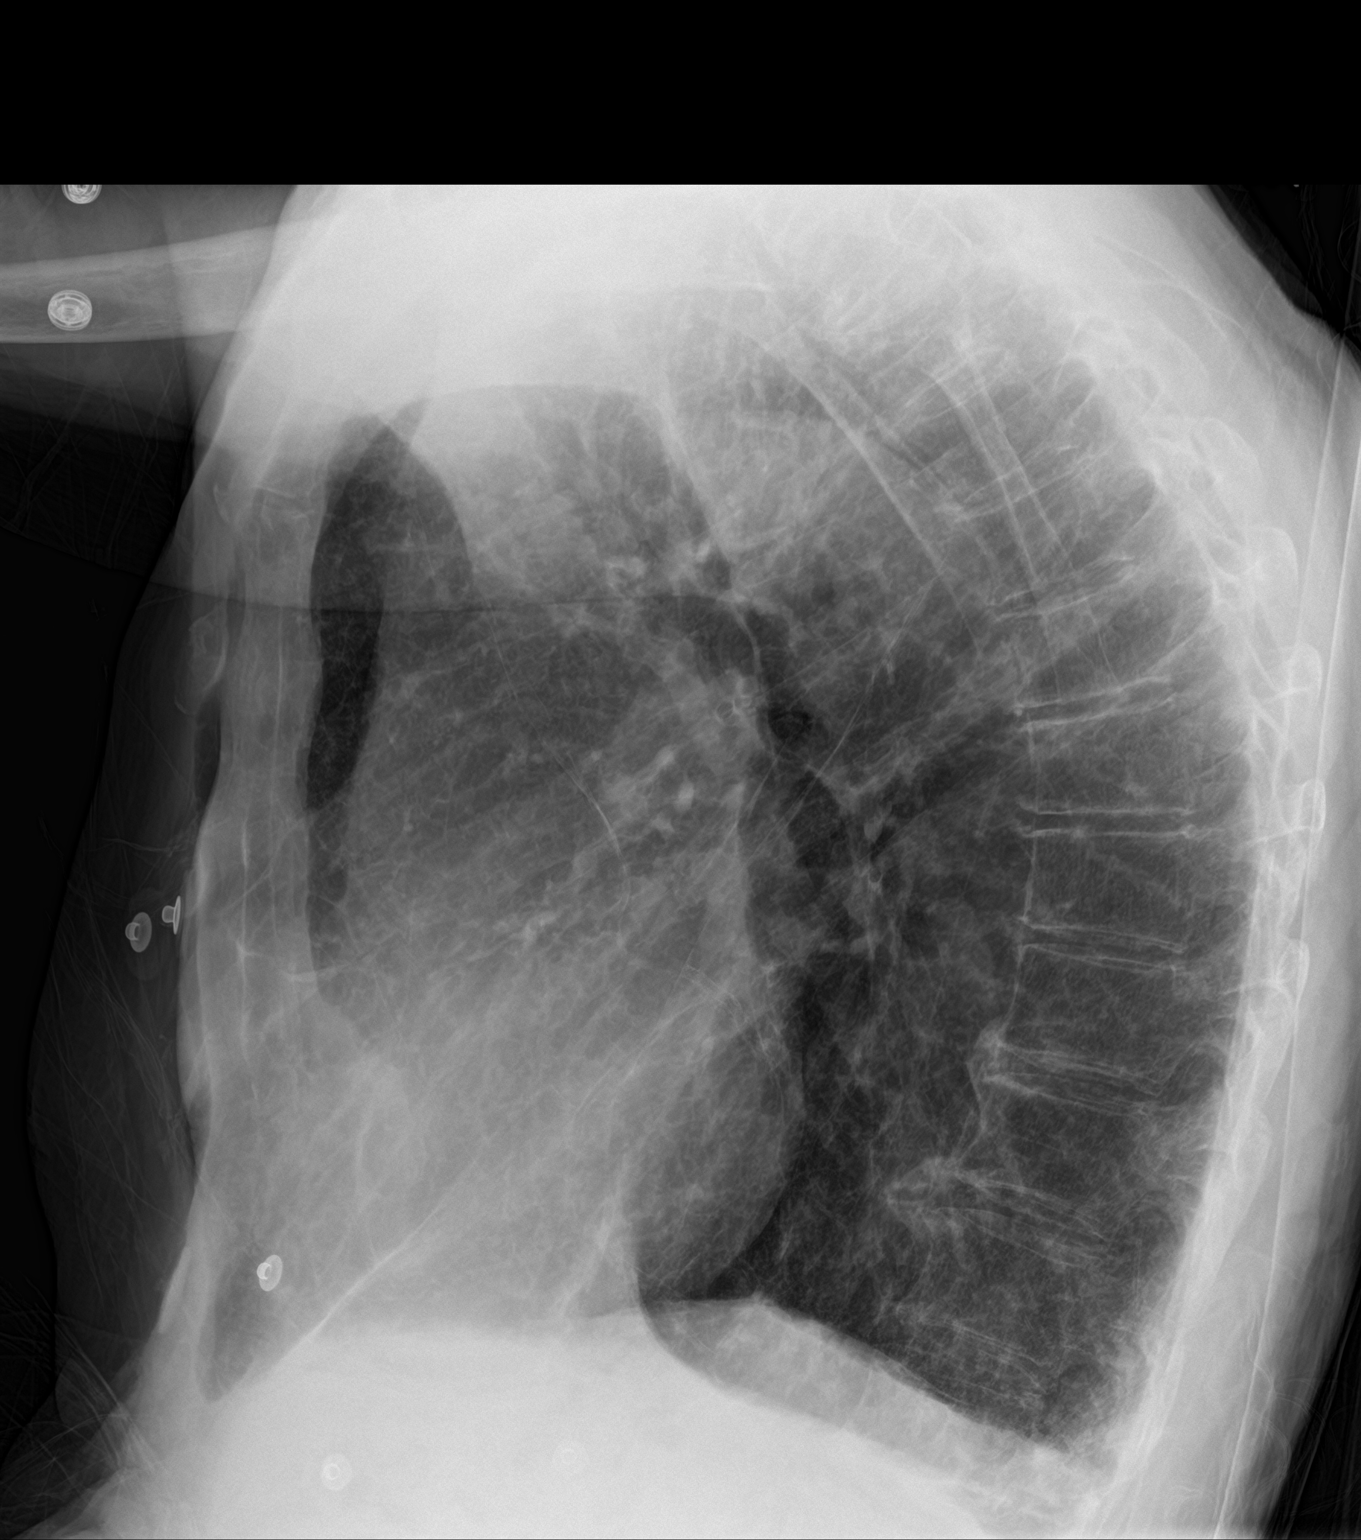

[chest ap (1 of 2)]
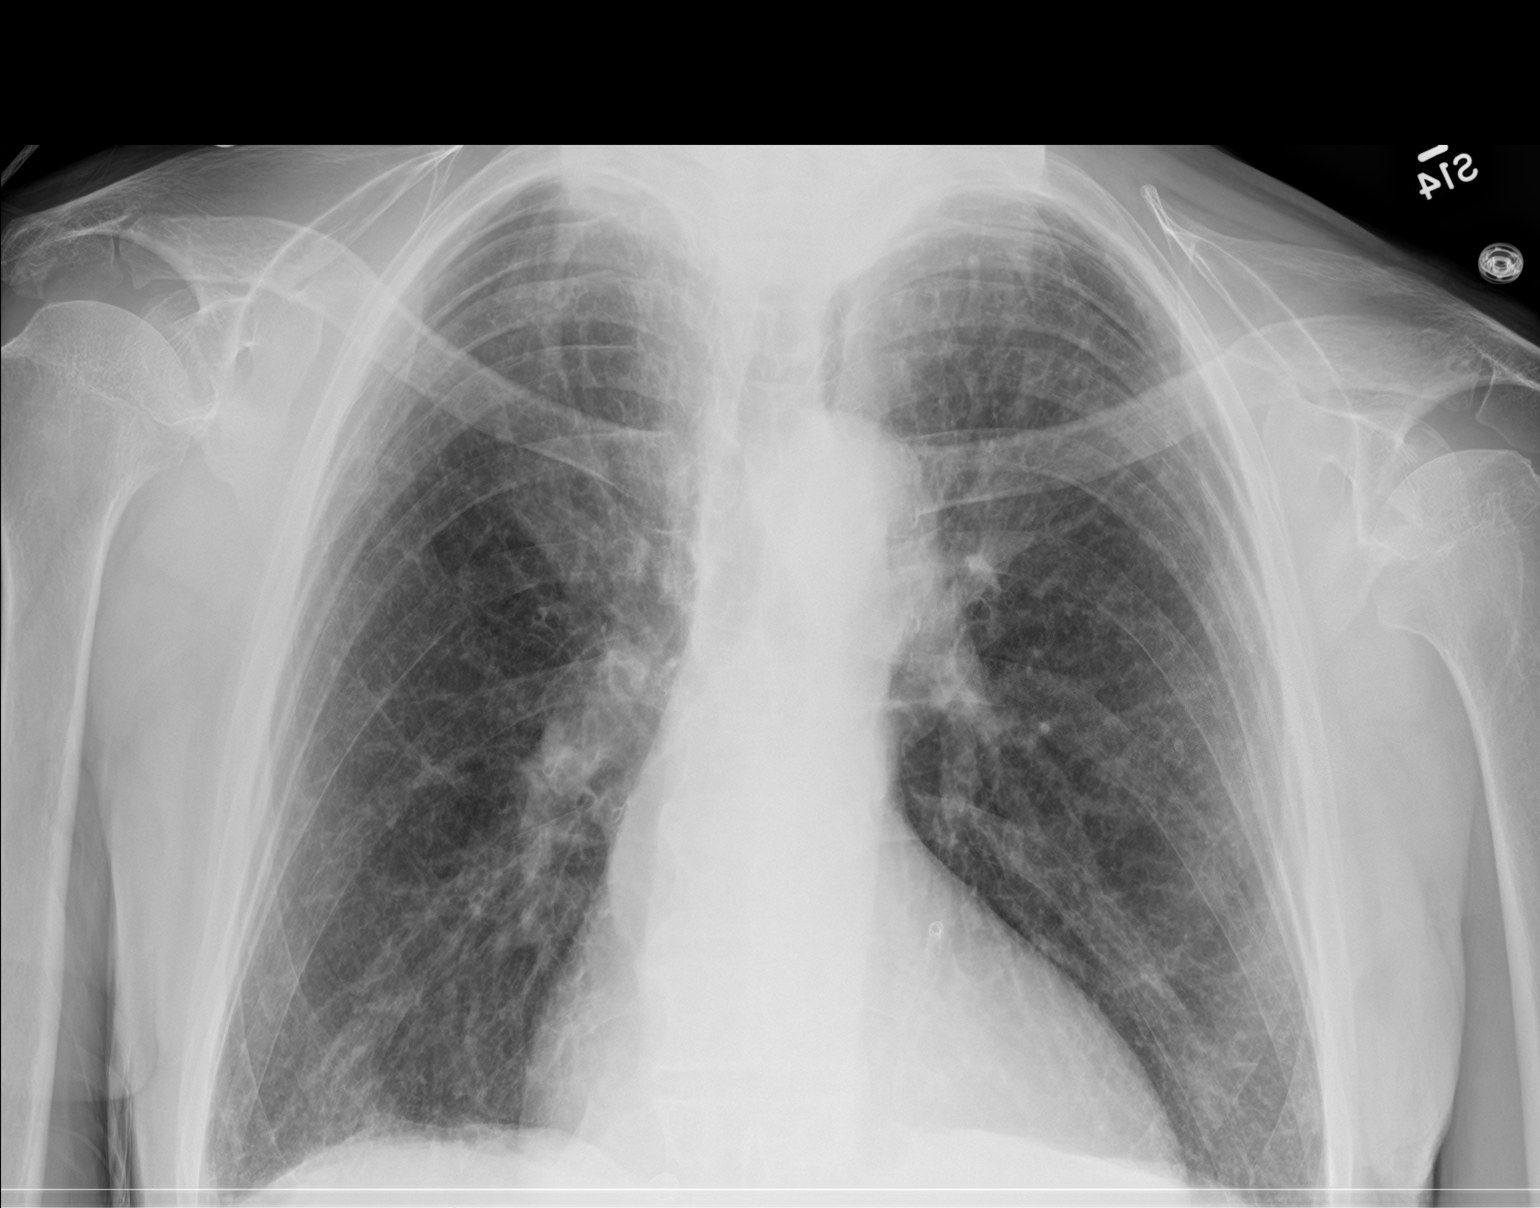

[chest ap (2 of 2)]
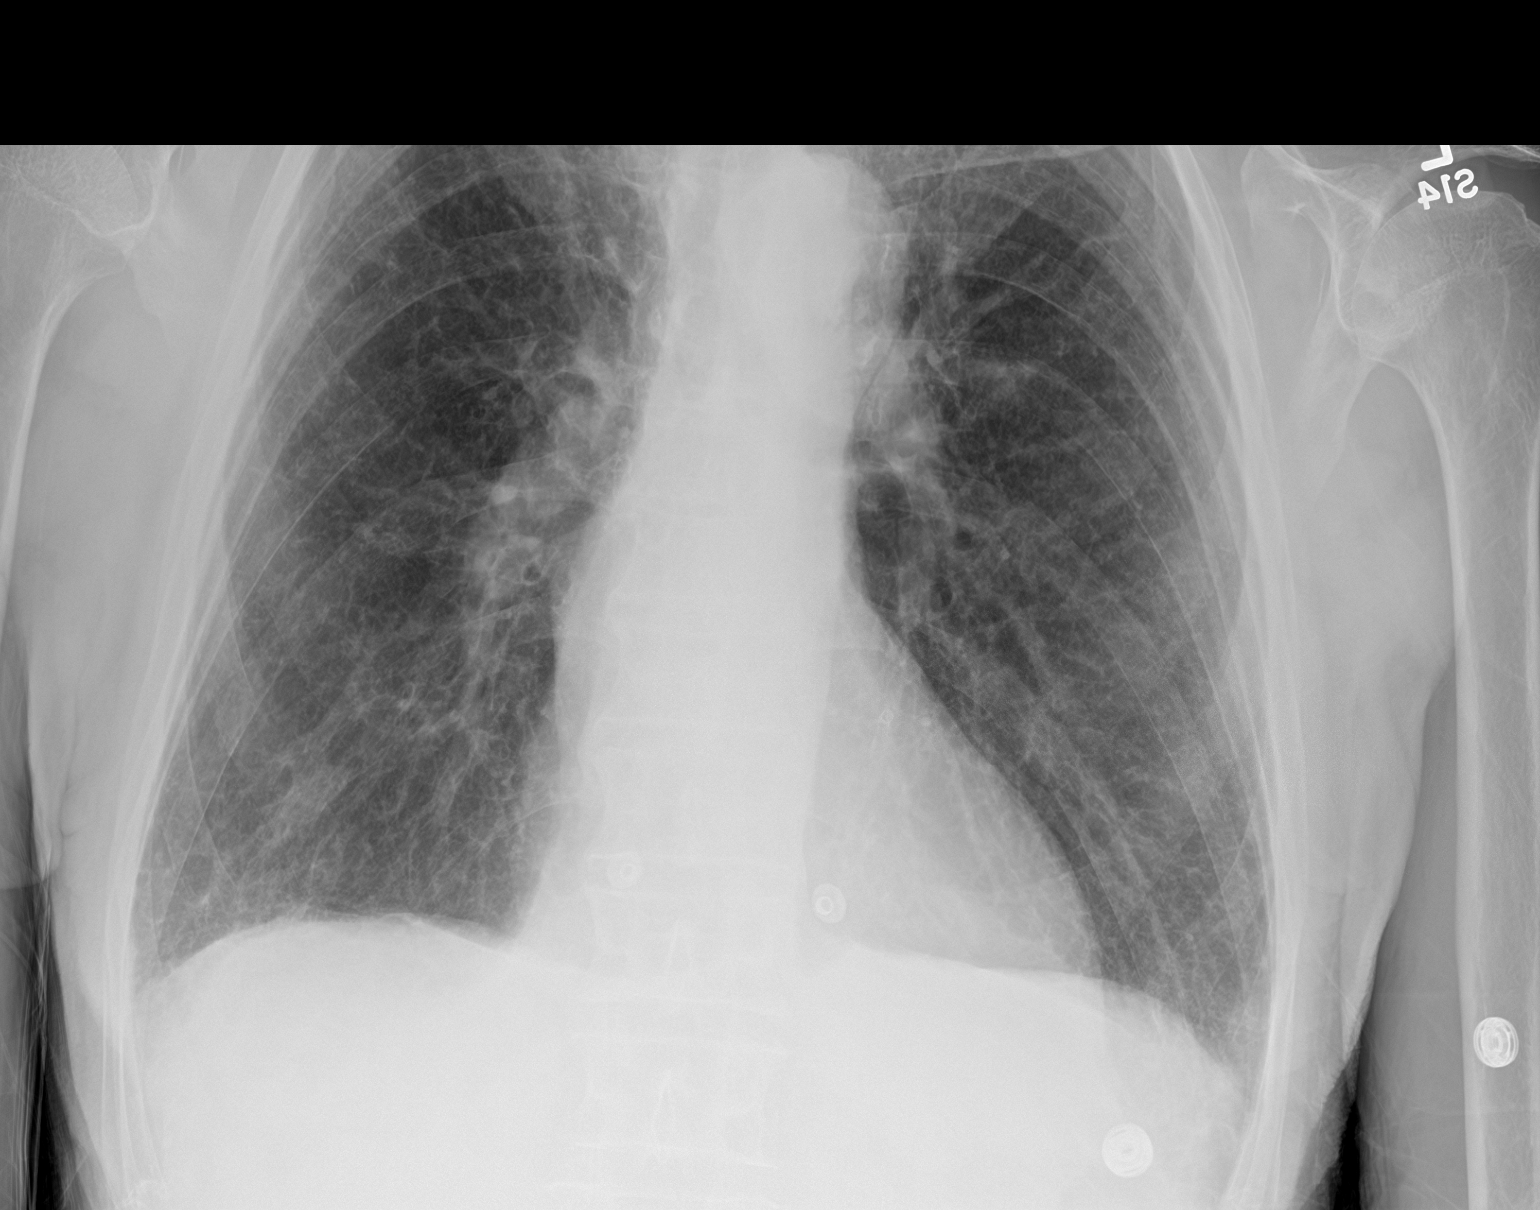

[3 of 3 positions shown; findings below may reference images not displayed]

FINDINGS: Cardiomediastinal silhouette is stable. No acute infiltrate or
pleural effusion. No pulmonary edema. Osteopenia and mild
degenerative changes thoracic spine. Hyperinflation again noted.
IMPRESSION: No active disease. Hyperinflation again noted. Osteopenia and mild
degenerative changes thoracic spine.

## 2017-03-02 DEATH — deceased
